# Patient Record
Sex: Male | Born: 1955 | Race: White | Hispanic: No | Marital: Married
Health system: Midwestern US, Community
[De-identification: ages and names within clinical notes are randomized; demographics above are authoritative.]

## PROBLEM LIST (undated history)

## (undated) DIAGNOSIS — G629 Polyneuropathy, unspecified: Secondary | ICD-10-CM

## (undated) DIAGNOSIS — M51369 Other intervertebral disc degeneration, lumbar region without mention of lumbar back pain or lower extremity pain: Secondary | ICD-10-CM

## (undated) DIAGNOSIS — E278 Other specified disorders of adrenal gland: Secondary | ICD-10-CM

## (undated) DIAGNOSIS — H4052X4 Glaucoma secondary to other eye disorders, left eye, indeterminate stage: Secondary | ICD-10-CM

## (undated) DIAGNOSIS — K219 Gastro-esophageal reflux disease without esophagitis: Secondary | ICD-10-CM

## (undated) DIAGNOSIS — R162 Hepatomegaly with splenomegaly, not elsewhere classified: Secondary | ICD-10-CM

## (undated) DIAGNOSIS — E119 Type 2 diabetes mellitus without complications: Secondary | ICD-10-CM

## (undated) DIAGNOSIS — M75121 Complete rotator cuff tear or rupture of right shoulder, not specified as traumatic: Secondary | ICD-10-CM

## (undated) DIAGNOSIS — I2510 Atherosclerotic heart disease of native coronary artery without angina pectoris: Secondary | ICD-10-CM

## (undated) DIAGNOSIS — M879 Osteonecrosis, unspecified: Secondary | ICD-10-CM

## (undated) DIAGNOSIS — I700 Atherosclerosis of aorta: Secondary | ICD-10-CM

## (undated) DIAGNOSIS — D329 Benign neoplasm of meninges, unspecified: Secondary | ICD-10-CM

## (undated) DIAGNOSIS — K7460 Unspecified cirrhosis of liver: Secondary | ICD-10-CM

## (undated) DIAGNOSIS — M7501 Adhesive capsulitis of right shoulder: Secondary | ICD-10-CM

## (undated) DIAGNOSIS — M1990 Unspecified osteoarthritis, unspecified site: Secondary | ICD-10-CM

## (undated) DIAGNOSIS — K449 Diaphragmatic hernia without obstruction or gangrene: Secondary | ICD-10-CM

## (undated) DIAGNOSIS — G43909 Migraine, unspecified, not intractable, without status migrainosus: Secondary | ICD-10-CM

## (undated) DIAGNOSIS — G4733 Obstructive sleep apnea (adult) (pediatric): Secondary | ICD-10-CM

## (undated) DIAGNOSIS — R112 Nausea with vomiting, unspecified: Secondary | ICD-10-CM

## (undated) DIAGNOSIS — E785 Hyperlipidemia, unspecified: Secondary | ICD-10-CM

## (undated) DIAGNOSIS — M8580 Other specified disorders of bone density and structure, unspecified site: Secondary | ICD-10-CM

## (undated) DIAGNOSIS — I10 Essential (primary) hypertension: Secondary | ICD-10-CM

## (undated) DIAGNOSIS — M4722 Other spondylosis with radiculopathy, cervical region: Secondary | ICD-10-CM

## (undated) DIAGNOSIS — Z7982 Long term (current) use of aspirin: Secondary | ICD-10-CM

## (undated) DIAGNOSIS — J45909 Unspecified asthma, uncomplicated: Secondary | ICD-10-CM

## (undated) DIAGNOSIS — Z9889 Other specified postprocedural states: Secondary | ICD-10-CM

## (undated) DIAGNOSIS — Z789 Other specified health status: Secondary | ICD-10-CM

## (undated) DIAGNOSIS — I5189 Other ill-defined heart diseases: Secondary | ICD-10-CM

## (undated) DIAGNOSIS — E11319 Type 2 diabetes mellitus with unspecified diabetic retinopathy without macular edema: Secondary | ICD-10-CM

## (undated) DIAGNOSIS — M76829 Posterior tibial tendinitis, unspecified leg: Secondary | ICD-10-CM

## (undated) DIAGNOSIS — Z86718 Personal history of other venous thrombosis and embolism: Secondary | ICD-10-CM

## (undated) DIAGNOSIS — S82875D Nondisplaced pilon fracture of left tibia, subsequent encounter for closed fracture with routine healing: Secondary | ICD-10-CM

## (undated) DIAGNOSIS — M5104 Intervertebral disc disorders with myelopathy, thoracic region: Secondary | ICD-10-CM

## (undated) DIAGNOSIS — M810 Age-related osteoporosis without current pathological fracture: Secondary | ICD-10-CM

## (undated) DIAGNOSIS — M7671 Peroneal tendinitis, right leg: Secondary | ICD-10-CM

## (undated) DIAGNOSIS — M76822 Posterior tibial tendinitis, left leg: Secondary | ICD-10-CM

## (undated) HISTORY — DX: Hepatomegaly with splenomegaly, not elsewhere classified: R16.2

## (undated) HISTORY — PX: CORONARY ANGIOPLASTY: SHX604

## (undated) HISTORY — PX: FINGER AMPUTATION: SHX636

## (undated) HISTORY — DX: Benign neoplasm of meninges, unspecified: D32.9

## (undated) HISTORY — DX: Polyneuropathy, unspecified: G62.9

## (undated) HISTORY — DX: Personal history of other venous thrombosis and embolism: Z86.718

## (undated) HISTORY — DX: Other specified disorders of bone density and structure, unspecified site: M85.80

## (undated) HISTORY — DX: Unspecified osteoarthritis, unspecified site: M19.90

## (undated) HISTORY — DX: Type 2 diabetes mellitus with unspecified diabetic retinopathy without macular edema: E11.319

## (undated) HISTORY — DX: Other specified disorders of adrenal gland: E27.8

## (undated) HISTORY — PX: OTHER SURGICAL HISTORY: SHX169

## (undated) HISTORY — PX: LUMBAR DISC SURGERY: SHX700

## (undated) HISTORY — PX: KNEE ARTHROSCOPY: SUR90

## (undated) HISTORY — PX: ACHILLES TENDON REPAIR: SUR1153

## (undated) HISTORY — DX: Atherosclerotic heart disease of native coronary artery without angina pectoris: I25.10

## (undated) HISTORY — DX: Hyperlipidemia, unspecified: E78.5

## (undated) HISTORY — DX: Unspecified asthma, uncomplicated: J45.909

## (undated) HISTORY — PX: LAPAROSCOPIC CHOLECYSTECTOMY: SUR755

## (undated) HISTORY — DX: Gastro-esophageal reflux disease without esophagitis: K21.9

## (undated) HISTORY — PX: TIBIA FRACTURE SURGERY: SHX806

## (undated) HISTORY — DX: Essential (primary) hypertension: I10

## (undated) HISTORY — DX: Migraine, unspecified, not intractable, without status migrainosus: G43.909

## (undated) HISTORY — PX: CARDIAC CATHETERIZATION: SHX172

## (undated) HISTORY — PX: CORONARY ARTERY BYPASS GRAFT: SHX141

## (undated) HISTORY — DX: Glaucoma secondary to other eye disorders, left eye, indeterminate stage: H40.52X4

## (undated) HISTORY — DX: Obstructive sleep apnea (adult) (pediatric): G47.33

## (undated) HISTORY — DX: Unspecified cirrhosis of liver: K74.60

## (undated) HISTORY — PX: ROTATOR CUFF REPAIR: SHX139

---

## 2009-08-22 LAB — CBC W/O DIFF
HCT: 46 % (ref 37.0–49.0)
HGB: 15.8 g/dL (ref 13.0–16.0)
MCH: 33.2 PG (ref 25.0–35.0)
MCHC: 34.3 g/dL (ref 31.0–37.0)
MCV: 96.9 FL (ref 78.0–98.0)
MPV: 8.6 FL (ref 7.4–10.4)
PLATELET: 246 10*3/uL (ref 130–400)
RBC: 4.75 M/uL (ref 4.50–5.30)
RDW: 12.8 % (ref 11.5–14.5)
WBC: 9.4 10*3/uL (ref 4.5–13.0)

## 2009-08-22 LAB — METABOLIC PANEL, BASIC
Anion gap: 7 mmol/L (ref 5–15)
BUN/Creatinine ratio: 24 — ABNORMAL HIGH (ref 12–20)
BUN: 22 MG/DL — ABNORMAL HIGH (ref 7–18)
CO2: 29 MMOL/L (ref 21–32)
Calcium: 9.8 MG/DL (ref 8.4–10.4)
Chloride: 97 MMOL/L — ABNORMAL LOW (ref 100–108)
Creatinine: 0.9 MG/DL (ref 0.6–1.3)
GFR est AA: >60 mL/min/{1.73_m2} (ref >60)
GFR est non-AA: >60 mL/min/{1.73_m2} (ref >60)
Glucose: 272 MG/DL — ABNORMAL HIGH (ref 74–99)
Potassium: 4.6 MMOL/L (ref 3.5–5.5)
Sodium: 133 MMOL/L — ABNORMAL LOW (ref 136–145)

## 2009-08-22 LAB — ALT: ALT (SGPT): 76 U/L — ABNORMAL HIGH (ref 30–65)

## 2009-08-22 LAB — AST: AST (SGOT): 70 U/L — ABNORMAL HIGH (ref 15–37)

## 2009-08-22 LAB — TSH 3RD GENERATION: TSH: 1.63 u[IU]/mL (ref 0.51–6.27)

## 2009-08-23 LAB — HEMOGLOBIN A1C WITH EAG: Hemoglobin A1c: 9.3 % — ABNORMAL HIGH (ref 4.8–6.0)

## 2013-06-22 LAB — AMB POC URINALYSIS DIP STICK AUTO W/O MICRO
Bilirubin (UA POC): NEGATIVE
Blood (UA POC): NEGATIVE
Ketones (UA POC): NEGATIVE
Leukocyte esterase (UA POC): NEGATIVE
Nitrites (UA POC): NEGATIVE
Protein (UA POC): NEGATIVE mg/dL
Specific gravity (UA POC): 1.015 (ref 1.001–1.035)
Urobilinogen (UA POC): 0.2 (ref 0.2–1)
pH (UA POC): 6.5 (ref ?–8.0)

## 2013-06-22 NOTE — Progress Notes (Signed)
Assessment      ICD-9-CM   1. ED (erectile dysfunction) 607.84   2. Diabetes 250.00     Erectile Dysfunction Counseling    The patient was referred from management of his erectile dysfunction by self, after watching Dr. Neil Crouch TV show.  The patient has had prior attempts at management of his erectile dysfunction.  The patient in the past has tried Viagra.    The pathophysiology and physiology of erections were discussed using the model that illustrates the difference between the flaccid and erect penis, and then using a smooth-muscle cell model I explained how an erection occurs.  The cyclic GMP and the cyclic AMP mechanisms were discussed as well as the place for alpha-sympathetic tone.      I then discussed with the patient from the standpoint of ???potential targets??? how pharmacologic therapy works and how combination therapy can be more efficacious than monotherapy.  Additionally, the place for oral therapy as well as urethral suppositories was also discussed with the patient.      The patient was told that the approach here is truly to begin with the less invasive therapy which would be oral therapy, moving on to intracorporal therapy if we could not get a successful result with oral therapy and if we could not get a success result with intracavernosal therapy then it might be appropriate to discuss prosthetic placement.  They understand that prostheses, while very good, are at this center considered to be the course of last resort.  They are such in that they have a finite life span, generally in the time of about 10 years, and with revision, the complication rate is higher.  The patient was also counseled with the option of continued watchful waiting.      Total time in the office face to face was 45 minutes and well over half of that was devoted to counseling, education, and coordination of care.                  Plan:  1. Daily Cialis 5 mg and PRN Viagra 100 mg as booster  2. VED  3. Consider PEP.     Follow-up Disposition: Not on File             06/22/2013        Subjective  Chief Complaint   Patient presents with   ??? Erectile Dysfunction     SELF REFERRED     HPI: Kyle Mueller is a 57 y.o. Caucasian male with a history of difficulty attaining and maintaining erections. States that symptoms progressed since onset, about 10 years ago, and now achieves 0% rigidity spontaneously, and he uses a VED with 60 % rigidity and uses the ring to maintain. With current rigidity he cannot have intercourse. Has tried Viagra with go response in that improved rigidity but not maintenance of rigidity. Had side effects with Viagra, headaches.    Since 2 years ago has lost 100 pounds over a period of 5 months. Before that he had hypercholesterolemia, HTN, type 2 DM, and was receiving medications for this conditions, but not now.  Former smoker, quit 16 years ago.  Works at [employer].   Family Hx Prostate cancer, father; died of lung cancer from asbestosis, mesotelioma (worked at shipyard); so his stepmother has mesothelioma.   No difficulty voiding, has noticed decreased FOS  Flank pain: YES  Decreased output: NO  Difficulty voiding: NO  Burning with urination: NO  Notable blood in urine:  NO  Incontinence:  NO  Nocturia:   YES  How many times?   1    ROS:    Review of Systems  Constitutional: Fever: No  Skin: Rash: No  HEENT: Hearing difficulty: No  Eyes: Blurred vision: No  Cardiovascular: Chest pain: No  Respiratory: Shortness of breath: No  Gastrointestinal: Nausea/vomiting: No  Musculoskeletal: Back pain: Yes  Neurological: Weakness: No  Psychological: Memory loss: No  Comments/additional findings:       Past Medical History   Diagnosis Date   ??? Erectile dysfunction    ??? Asthma    ??? Diabetes      CONTROLLED W/FOOD AND DIET     Past Surgical History   Procedure Laterality Date   ??? Hx rotator cuff repair  2009     OBICI   ??? Hx orthopaedic       BILAT ACHILLES TENDON REPAIR   ??? Hx back surgery  1991     L-4 L-5 DISCECTOMY      History     Social History   ??? Marital Status: MARRIED     Spouse Name: N/A     Number of Children: N/A   ??? Years of Education: N/A     Occupational History   ??? Not on file.     Social History Main Topics   ??? Smoking status: Former Smoker -- 1.00 packs/day for 25 years     Quit date: 12/24/1996   ??? Smokeless tobacco: Never Used   ??? Alcohol Use: No   ??? Drug Use: No   ??? Sexually Active: Yes -- Male partner(s)      Comment: E.D.     Other Topics Concern   ??? Not on file     Social History Narrative   ??? No narrative on file     Family History   Problem Relation Age of Onset   ??? Inflammatory Bowel Dz Mother    ??? Cancer Father      LUNG CA   ??? Kidney Disease Sister      RENAL FAILURE   ??? Diabetes Sister      No current outpatient prescriptions on file prior to visit.     No current facility-administered medications on file prior to visit.     Allergies   Allergen Reactions   ??? Augmentin (Amoxicillin-Pot Clavulanate) Anaphylaxis, Shortness of Breath and Palpitations     BP DROPS   ??? Codeine Other (comments)     COMBATIVE   ??? Dilaudid (Hydromorphone (Bulk)) Other (comments)     HALLUCINATIONS       Objective  BP 142/84   Ht 6' (1.829 m)   Wt 200 lb (90.719 kg)   BMI 27.12 kg/m2  Constitutional: WDWN, Pleasant and appropriate affect, No acute distress.    CV:  No peripheral swelling noted  Respiratory: No respiratory distress or difficulties  Abdomen:  No abdominal masses or tenderness.  No CVA tenderness. No hernias noted.   GU Male: DRE and PSA by PCP    SCROTUM:  No scrotal rash or lesions noticed.  Normal bilateral testes and epididymis.   PENIS: Urethral meatus normal in location and size. No urethral discharge.  Skin: No jaundice.    Neuro/Psych:  Alert and oriented x 3. Affect appropriate.   Lymphatic:   No enlarged inguinal lymph nodes.          No results found for this or any previous visit.    This note has been  sent to the referring physician with findings and recommendations. I would like to thank Dr.  Cordelia Poche, NP for allowing me the opportunity to assist with the care of this nice patient.       Scot Dock, MD  [facility]  A Division of [facility] [place]

## 2013-06-24 NOTE — Telephone Encounter (Signed)
Patient called to say he has tried the Cialis and Levitra given to him earlier this week and he can't tolerate them because they too cause a terrible headache.  He asked about injection therapy, which is something Dr Charyl Bigger had mentioned to him.  He is scheduled to come in the lst week of August.

## 2013-07-27 NOTE — Progress Notes (Signed)
The patient came in for what was to be his 1st ici dosing appointment, but after viewing the video and going thru the medications and their cost, he stated he would rather get a penile prosthesis.  I gave him the information for prosthesis and told him to check with his insurance company to make sure it was a covered benefit.  He also has a f/u appointment in September with Dr Charyl Bigger, but asked to be seen sometime this month if possible.  He was given an appointment for later this week.  I asked Vee if his payment today could be applied to the next office visit and she made a note in the system.

## 2015-07-14 ENCOUNTER — Encounter: Attending: Specialist | Primary: Registered Nurse

## 2015-07-15 DIAGNOSIS — M1990 Unspecified osteoarthritis, unspecified site: Secondary | ICD-10-CM | POA: Insufficient documentation

## 2016-05-24 DIAGNOSIS — A498 Other bacterial infections of unspecified site: Secondary | ICD-10-CM

## 2016-05-24 DIAGNOSIS — B9561 Methicillin susceptible Staphylococcus aureus infection as the cause of diseases classified elsewhere: Secondary | ICD-10-CM

## 2016-05-24 HISTORY — PX: LEFT HEART CATH AND CORONARY ANGIOGRAPHY: CATH118249

## 2016-05-24 HISTORY — DX: Methicillin susceptible Staphylococcus aureus infection as the cause of diseases classified elsewhere: B95.61

## 2016-05-24 HISTORY — DX: Other bacterial infections of unspecified site: A49.8

## 2016-05-25 DIAGNOSIS — Z951 Presence of aortocoronary bypass graft: Secondary | ICD-10-CM

## 2016-05-25 HISTORY — PX: CORONARY ARTERY BYPASS GRAFT: SHX141

## 2016-05-25 HISTORY — DX: Presence of aortocoronary bypass graft: Z95.1

## 2018-05-21 ENCOUNTER — Ambulatory Visit: Attending: Foot and Ankle Surgery | Primary: Registered Nurse

## 2018-05-21 ENCOUNTER — Ambulatory Visit
Admit: 2018-05-21 | Discharge: 2018-05-21 | Payer: PRIVATE HEALTH INSURANCE | Attending: Foot and Ankle Surgery | Primary: Registered Nurse

## 2018-05-21 DIAGNOSIS — M76822 Posterior tibial tendinitis, left leg: Secondary | ICD-10-CM

## 2018-05-21 MED ORDER — DICLOFENAC 75 MG TAB, DELAYED RELEASE
75 mg | ORAL_TABLET | Freq: Two times a day (BID) | ORAL | 0 refills | Status: DC
Start: 2018-05-21 — End: 2018-07-04

## 2018-05-21 NOTE — Progress Notes (Signed)
1. Have you been to the ER, urgent care clinic since your last visit?  Hospitalized since your last visit?Yes      2. Have you seen or consulted any other health care providers outside of the [facility] since your last visit?  Include any pap smears or colon screening.           Patient was treated this pass Saturday at [place]

## 2018-05-21 NOTE — Progress Notes (Signed)
Verbal order given by Dr. Caines to sign order for MRI and DME entered by Jennifer Hesse.

## 2018-05-21 NOTE — Progress Notes (Signed)
AMBULATORY PROGRESS NOTE      Patient: Kyle Mueller             MRN: [id number]     SSN: [id number] Body mass index is 29.13 kg/m??.  Date of Birth: 21-Nov-1956     AGE: 62 y.o.       EX: male    PCP: Cordelia Poche, NP     IMPRESSION/DIAGNOSIS AND TREATMENT PLAN     DIAGNOSES  1. 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 Post-traumatic osteoarthritis of left ankle        Orders Placed This Encounter   ??? AMB SUPPLY ORDER   ??? MRI ANKLE LT WO CONT   ??? diclofenac EC (VOLTAREN) 75 mg EC tablet      Kyle Mueller understands his diagnoses and the proposed plan.   He has posterior tibial tendinitis, moderate.  There is some swelling and some tenderness to the posterior tibial tendon and the anterior joint line.  Clinically, he has poor motion of the hindfoot.  He has OA of the ankle, subtalar, and transverse tarsal joint regions, and to the talonavicular region, as there is evidence of bone spurs at the talonavicular region and poor motion of the subtalar region and decreased and/or restricted range of motion of the ankle.    Because of his continued pain and discomfort along the posterior tibial tendon, recommendation is for an MRI of the hindfoot to assess the posterior tendon to rule out a concomitant interstitial tear to this region, as he has distinct swelling and tenderness to this region.  Indeed, an MRI does have prognostic value in this individual.        Plan:    1) MRI without IV Contrast of the left ankle; please assess the posterior tibial tendon.   2) DME Order: Short left CAM walker boot.   3) Voltaren 75 mg: 1 PO BID; 60 tablets, 0 refills.   4) Pepcid 40 mg: 1 PO every day; 30 tablets, 0 refills. The patient already has this medication.   5) Continue activity modification as directed.      RTO - after MRI   HPI AND EXAMINATION     Kyle Mueller IS A 62 y.o. male who presents to my outpatient office complaining of left foot pain. Mr. Bonczek states that he has been  experiencing pain in his left medial ankle for the past 2-3 weeks. He believes that this pain is due to bone spurs in this area. He recalls that his left medial ankle became swollen a few days ago after wearing shoes for some time. He has been taking Ibuprofen and has tried an ankle brace and muscle relaxers for his pain, none of which have helped with his pain. XR imaging has been reviewed with the patient.     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 He is not currently taking blood thinners, other than ASA.     Visit Vitals  BP 129/72 (BP 1 Location: Left arm, BP Patient Position: Sitting)   Pulse 91   Temp 96.6 ??F (35.9 ??C) (Oral)   Resp 18   Ht 6' (1.829 m)   Wt 214 lb 12.8 oz (97.4 kg)   SpO2 93%   BMI 29.13 kg/m??     Appearance: Alert, well appearing and pleasant patient who is in no distress, oriented to person, place/time, and who follows commands.This patient is accompanied in the examination room by his  wife. Dementia: no dementia  Psychiatric: Affect and mood are appropriate. Patient arrives to office via: without assistive device  HEENT: Head normocephalic & atraumatic. Both pupils are round, non icteric sclera   Eye: EOM are intact and sclera are clear    Neck: ROM WNL and JVD neck is not present     Hearings Intact, does not require hearing aid device  Respiratory: Breathing is unlabored without accessory chest muscle use  Cardiovascular/Peripheral Vascular: Normal Pulses to each foot    ANKLE/FOOT left    Gait: Normal  Tenderness: Mild tenderness to the posterior tibial tendon behind the medial malleolus.   Cutaneous: WNL.  Joint Motion: Can DF to neutral with knee flexed and extended    30 degrees of PF    Limited ST motion    Limited inversion.   Joint / Tendon Stability: No Ankle or Subtalar instability or joint laxity.                       No peroneal sublux ability or dislocation  Alignment: Forefoot, Midfoot, Hindfoot WNL.   Neuro Motor/Sensory: NL/NL.   Vascular: NL foot/ankle pulses.  Lymphatics: No extremity lymphedema, No calf swelling, no tenderness to calf muscles.    CHART REVIEW     Past Medical History:   Diagnosis Date   ??? Asthma    ??? Diabetes (HCC)     CONTROLLED W/FOOD AND DIET   ??? 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 Erectile dysfunction    ??? Hypertension      Current Outpatient Medications   Medication Sig   ??? aspirin (ASPIRIN) 325 mg tablet Take 325 mg by mouth.   ??? benzonatate (TESSALON) 200 mg capsule Take 200 mg by mouth.   ??? glucose blood VI test strips (ONETOUCH ULTRA BLUE TEST STRIP) strip TEST BLOOD GLUCOSE UP TO  QID   ??? cyclobenzaprine (FLEXERIL) 10 mg tablet Take 5-10 mg by mouth.   ??? insulin glargine-lixisenatide (SOLIQUA 100/33) 100 unit-33 mcg/mL inpn 22 Units by SubCUTAneous route.   ??? metoprolol succinate (TOPROL-XL) 25 mg XL tablet Take 25 mg by mouth.   ??? omeprazole (PRILOSEC) 40 mg capsule Take 40 mg by mouth.   ??? rosuvastatin (CRESTOR) 20 mg tablet Take 20 mg by mouth.   ??? diclofenac EC (VOLTAREN) 75 mg EC tablet Take 1 Tab by mouth two (2) times daily (with meals).   ??? tadalafil (CIALIS) 5 mg tablet Take 5 mg by mouth daily as needed.   ??? vardenafil (LEVITRA) 20 mg tablet Take 20 mg by mouth daily as needed for 10 doses.     No current facility-administered medications for this visit.      Allergies   Allergen Reactions   ??? Augmentin [Amoxicillin-Pot Clavulanate] Anaphylaxis, Shortness of Breath and Palpitations     BP DROPS   ??? Codeine Other (comments)     COMBATIVE   ??? Dilaudid [Hydromorphone (Bulk)] Other (comments)     HALLUCINATIONS   ??? Percocet [Oxycodone-Acetaminophen] Nausea and Vomiting     Past Surgical History:   Procedure Laterality Date   ??? CARDIAC SURG PROCEDURE UNLIST  05/25/2016   ??? HX BACK SURGERY  1991    L-4 L-5 DISCECTOMY   ??? HX MOHS PROCEDURES  2009    OBICI   ??? HX ORTHOPAEDIC      BILAT ACHILLES TENDON REPAIR     Social History     Occupational History   ???  Not on file   Tobacco Use    ??? Smoking status: Former Smoker     Packs/day: 1.00     Years: 25.00     Pack years: 25.00     Last attempt to quit: 12/24/1996     Years since quitting: 21.4   ??? Smokeless tobacco: Never Used   Substance and Sexual Activity   ??? Alcohol use: No     Frequency: Never     Comment: ocassional ( did not answer)    ??? Drug use: No   ??? Sexual activity: Yes     Partners: Female     Comment: E.D.     Family History   Problem Relation Age of Onset   ??? Inflammatory Bowel Dz Mother    ??? Cancer Father         LUNG CA   ??? Kidney Disease Sister         RENAL FAILURE   ??? Diabetes Sister         REVIEW OF SYSTEMS : 05/21/2018  ALL BELOW ARE Negative except : SEE HPI     CONSTITUTIONAL: denies chills, fatigue, fever, weight change   PSYCH: denies anxiety, depression, irritability or mood swings   ENT: denies - headaches, hearing change, nasal congestion, oral lesions, or sore throat   HEM/ONC denies - bleeding problems, bruising, pallor or swollen lymph nodes   ENDO: denies hot flashes, polydipsia/polyuria or temperature intolerance   RESP: denies - cough, shortness of breath or wheezing   CV: denies - chest pain, edema or palpitations, DOE  GI: denies - abdominal pain, change in bowel habits, constipation, diarrhea or nausea/vomiting   GU: denies - dysuria, hematuria, incontinence, pelvic pain   MSK: denies  - See HPI.  NEURO: denies - confusion, headaches, seizures or weakness   DERM: denies - dry skin, hair changes, rash or skin lesion changes  VASCULAR: Peripheral Vascular: No calf pain, vascular vein tenderness to calf pain              No calf throbbing, posterior knee throbbing pain      DIAGNOSTIC IMAGING      [facility] : INTERPRETATION BY RADIOLOGIST       2019  May  ANKLE COMPLETE LT5/25/2019  [facility]  Result Impression    Soft tissue swelling laterally.????No acute fractures or subluxation of left ankle. Degenerative changes as described.     Signed By: Gretchen Portela, MD on 05/17/2018 8:45 PM    Result Narrative   EXAM: Left Ankle x-rays 3 views. AP, lateral and oblique    INDICATION: Left ankle swelling    COMPARISON: None    ___________    FINDINGS: There are no acute fractures or subluxation. Ankle mortis is intact. Soft tissues demonstrate soft tissue swelling laterally . There is normal bone mineralization. There are suture anchors in the calcaneus. There is a 6.5 mm sharp plantar fascia calcaneal spur. There is spurring along the dorsal aspect of the talonavicular joint. Is also spurring along the anterior aspect of the tibia which may predispose to anterior impingement.    ___________   Other Result Information   Interface, [facility] - 05/17/2018  8:48 PM EDT  EXAM: Left Ankle x-rays 3 views. AP, lateral and oblique    INDICATION: Left ankle swelling    COMPARISON: None    ___________    FINDINGS: There are no acute fractures or subluxation. Ankle mortis is intact. Soft tissues demonstrate  soft tissue swelling laterally . There is normal bone mineralization. There are suture anchors in the calcaneus. There is a 6.5 mm sharp plantar fascia calcaneal spur. There is spurring along the dorsal aspect of the talonavicular joint. Is also spurring along the anterior aspect of the tibia which may predispose to anterior impingement.    ___________    IMPRESSION Soft tissue swelling laterally.  No acute fractures or subluxation of left ankle. Degenerative changes as described.     Signed By: Gretchen Portela, MD on 05/17/2018 8:45 PM   Status Results Details   ?? Encounter Summary       I have personally reviewed these images of the above study. I agree with interpretation from radiologist.        Netta Corrigan, MD  05/21/2018  10:33 AM      Written by Hoyt Koch, [employer], as dictated by Dr. Silvano Bilis. I, Dr. Silvano Bilis, confirm that all documentation is accurate.

## 2018-05-21 NOTE — Patient Instructions (Signed)
An MRI or CT has been ordered for you. A [place] scheduler will be contacting you to schedule the appointment as soon as it has been approved with your insurance company. Please schedule an appointment to follow up with the doctor or the physicians assistant after the MRI or CT has been conducted.       Tendon Injury (Tendinopathy): Care Instructions  Your Care Instructions    Tendons are tough, flexible tissues that connect muscle to bone. A tendon can hurt or get torn from overuse or aging, especially tendons in the shoulder, elbow, wrist, hip, knee, or ankle. Tendon injuries may be called tendinopathy or tendinitis. Tendon injuries can occur from any motion you have to repeat in a job, sports, or daily activities. Tennis elbow is one common tendon injury.  You can treat most tendon problems with over-the-counter pain medicine, rest, changes in your activities, and physical therapy.  Follow-up care is a key part of your treatment and safety. Be sure to make and go to all appointments, and call your doctor if you are having problems. It's also a good idea to know your test results and keep a list of the medicines you take.  How can you care for yourself at home?  ?? Rest the sore area. You may have to stop doing the activity that caused the tendon pain for a while.  ?? Take an over-the-counter pain medicine, such as acetaminophen (Tylenol), ibuprofen (Advil, Motrin), or naproxen (Aleve). Read and follow all instructions on the label.  ?? Do not take two or more pain medicines at the same time unless the doctor told you to. Many pain medicines have acetaminophen, which is Tylenol. Too much acetaminophen (Tylenol) can be harmful.  ?? Put ice or a cold pack on the sore area for 10 to 20 minutes at a time. Try to do this every 1 to 2 hours for the next 3 days (when you are awake) or until any swelling goes down. Put a thin cloth between the ice and your skin.   ?? Prop up the sore area on a pillow when you ice it or anytime you sit or lie down during the next 3 days. Try to keep it above the level of your heart. This will help reduce swelling.  ?? Follow your doctor's advice for wearing and caring for a sling, splint, or cast. In some cases, you may wear one of these for a while to help your tendon heal.  ?? Follow your doctor's advice for stretching and physical therapy. Gently move your joint through its full range of motion. This will prevent stiffness in your joint.  ?? Go back to your activity slowly. Warm up before and stretch after the activity. You also can try making some changes. For example, if a sport caused your tendon pain, alternate the sport with another activity. If using a tool causes pain, switch hands or change your grip. Stop the activity if it hurts. After the activity, apply ice to prevent pain and swelling.  ?? Do not smoke. Smoking can slow healing. If you need help quitting, talk to your doctor about stop-smoking programs and medicines. These can increase your chances of quitting for good.  When should you call for help?  Watch closely for changes in your health, and be sure to contact your doctor if:  ?? ?? Your pain gets worse.   ?? ?? You do not get better as expected.   Where can you learn more?    Go to http://www.healthwise.net/[employer].  Enter A157 in the search box to learn more about "Tendon Injury (Tendinopathy): Care Instructions."  Current as of: September 12, 2017  Content Version: 11.9  ?? 2006-2018 [employer]. Care instructions adapted under license by [employer] (which disclaims liability or warranty for this information). If you have questions about a medical condition or this instruction, always ask your healthcare professional. [employer]wise, Inc[employer] disclaims any warranty or liability for your use of this information.

## 2018-05-21 NOTE — Progress Notes (Addendum)
Progress Notes by Netta Corrigan, MD at 05/21/18 0830                Author: Netta Corrigan, MD  Service: --  Author Type: Physician       Filed: 05/22/18 1901  Encounter Date: 05/21/2018  Status: Signed          Editor: Netta Corrigan, MD (Physician)          Related Notes: Original Note by Netta Corrigan, MD (Physician) filed at 05/21/18 1039               AMBULATORY PROGRESS NOTE         Patient: Kyle Mueller              MRN: [id number]     SSN:  [id number] Body mass index is 29.13 kg/m??.   Date of Birth: 04-Mar-1956     AGE:  62 y.o.       EX: male      PCP: Cordelia Poche, NP         IMPRESSION/DIAGNOSIS AND TREATMENT PLAN        DIAGNOSES      1.  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  Post-traumatic osteoarthritis of left ankle              Orders Placed This Encounter        ?  AMB SUPPLY ORDER     ?  MRI ANKLE LT WO CONT        ?  diclofenac EC (VOLTAREN) 75 mg EC tablet         Kyle Mueller understands his  diagnoses and the proposed plan.    He has posterior tibial tendinitis, moderate.  There is some swelling and some tenderness to the posterior tibial tendon and the anterior joint line.  Clinically, he has poor motion of the hindfoot.  He has OA of the ankle, subtalar, and transverse tarsal  joint regions, and to the talonavicular region, as there is evidence of bone spurs at the talonavicular region and poor motion of the subtalar region and decreased and/or restricted range of motion of the ankle.      Because of his continued pain and discomfort along the posterior tibial tendon, recommendation is for an MRI of the hindfoot to assess the posterior tendon to rule out a concomitant interstitial tear to this region, as he has distinct swelling and tenderness  to this region.  Indeed, an MRI does have prognostic value in this individual.           Plan:      1) MRI without IV Contrast of the left ankle; please assess the posterior tibial tendon.    2)  DME Order: Short left CAM walker boot.    3) Voltaren 75 mg: 1 PO BID; 60 tablets, 0 refills.    4) Pepcid 40 mg: 1 PO every day; 30 tablets, 0 refills. The patient already has this medication.    5) Continue activity modification as directed.        RTO - after MRI         HPI AND EXAMINATION        Kyle Mueller IS A 62 y.o.  male who presents to my outpatient office complaining of left foot pain. [name] states that he has been experiencing pain in his left medial ankle for  the past 2-3 weeks. He believes that this  pain is due to bone spurs in this area. He recalls that his left medial ankle became swollen a few days ago after wearing shoes for some time. He has been taking Ibuprofen and has tried an ankle brace and muscle relaxers for his pain, none of which have  helped with his pain. XR imaging has been reviewed with the patient.       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 He is not currently taking blood thinners, other than ASA.       Visit Vitals      BP  129/72 (BP 1 Location: Left arm, BP Patient Position: Sitting)     Pulse  91     Temp  96.6 ??F (35.9 ??C) (Oral)     Resp  18     Ht  6' (1.829 m)     Wt  214 lb 12.8 oz (97.4 kg)     SpO2  93%        BMI  29.13 kg/m??        Appearance: Alert, well appearing and pleasant patient who is in no distress, oriented to person, place/time, and who follows commands.This  patient is accompanied in the examination room by his wife . Dementia: no dementia   Psychiatric: Affect and mood are appropriate. Patient arrives to office via: without assistive device   HEENT: Head normocephalic & atraumatic. Both pupils are round, non icteric sclera    Eye: EOM are intact and sclera are clear     Neck: ROM WNL and JVD neck is not present      Hearings Intact, does not require hearing aid device   Respiratory: Breathing is unlabored without accessory chest muscle use   Cardiovascular/Peripheral Vascular: Normal Pulses to each foot      ANKLE/FOOT left       Gait: Normal   Tenderness: Mild tenderness to the posterior tibial tendon behind the medial malleolus.    Cutaneous: WNL.   Joint Motion: Can DF to neutral with knee flexed and extended     30 degrees of PF     Limited ST motion     Limited inversion.    Joint / Tendon Stability: No Ankle or Subtalar instability or joint laxity.                        No peroneal sublux ability or dislocation   Alignment: Forefoot, Midfoot, Hindfoot WNL.    Neuro Motor/Sensory: NL/NL.   Vascular: NL foot/ankle pulses.   Lymphatics: No extremity lymphedema, No calf swelling, no tenderness to calf muscles.        CHART REVIEW          Past Medical History:        Diagnosis  Date         ?  Asthma       ?  Diabetes (HCC)            CONTROLLED W/FOOD AND DIET         ?  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  Erectile dysfunction           ?  Hypertension            Current Outpatient Medications        Medication  Sig         ?  aspirin (ASPIRIN) 325 mg tablet  Take 325 mg by mouth.     ?  benzonatate (TESSALON) 200 mg capsule  Take 200 mg by mouth.     ?  glucose blood VI test strips (ONETOUCH ULTRA BLUE TEST STRIP) strip  TEST BLOOD GLUCOSE UP TO  QID     ?  cyclobenzaprine (FLEXERIL) 10 mg tablet  Take 5-10 mg by mouth.     ?  insulin glargine-lixisenatide (SOLIQUA 100/33) 100 unit-33 mcg/mL inpn  22 Units by SubCUTAneous route.     ?  metoprolol succinate (TOPROL-XL) 25 mg XL tablet  Take 25 mg by mouth.     ?  omeprazole (PRILOSEC) 40 mg capsule  Take 40 mg by mouth.     ?  rosuvastatin (CRESTOR) 20 mg tablet  Take 20 mg by mouth.     ?  diclofenac EC (VOLTAREN) 75 mg EC tablet  Take 1 Tab by mouth two (2) times daily (with meals).     ?  tadalafil (CIALIS) 5 mg tablet  Take 5 mg by mouth daily as needed.         ?  vardenafil (LEVITRA) 20 mg tablet  Take 20 mg by mouth daily as needed for 10 doses.          No current facility-administered medications for this visit.           Allergies        Allergen   Reactions         ?  Augmentin [Amoxicillin-Pot Clavulanate]  Anaphylaxis, Shortness of Breath and Palpitations             BP DROPS         ?  Codeine  Other (comments)             COMBATIVE         ?  Dilaudid [Hydromorphone (Bulk)]  Other (comments)             HALLUCINATIONS         ?  Percocet [Oxycodone-Acetaminophen]  Nausea and Vomiting          Past Surgical History:         Procedure  Laterality  Date          ?  CARDIAC SURG PROCEDURE UNLIST    05/25/2016     ?  HX BACK SURGERY    1991          L-4 L-5 DISCECTOMY          ?  HX MOHS PROCEDURES    2009          OBICI          ?  HX ORTHOPAEDIC              BILAT ACHILLES TENDON REPAIR          Social History          Occupational History        ?  Not on file       Tobacco Use         ?  Smoking status:  Former Smoker              Packs/day:  1.00         Years:  25.00         Pack years:  25.00         Last attempt to quit:  12/24/1996  Years since quitting:  21.4         ?  Smokeless tobacco:  Never Used       Substance and Sexual Activity         ?  Alcohol use:  No              Frequency:  Never             Comment: ocassional ( did not answer)          ?  Drug use:  No     ?  Sexual activity:  Yes              Partners:  Female             Comment: E.D.          Family History         Problem  Relation  Age of Onset          ?  Inflammatory Bowel Dz  Mother       ?  Cancer  Father                LUNG CA          ?  Kidney Disease  Sister                RENAL FAILURE          ?  Diabetes  Sister                REVIEW OF SYSTEMS : 05/21/2018   ALL BELOW ARE Negative except : SEE HPI        CONSTITUTIONAL: denies chills, fatigue, fever, weight change    PSYCH: denies anxiety, depression, irritability or mood swings    ENT: denies - headaches, hearing change, nasal congestion, oral lesions, or sore throat    HEM/ONC denies - bleeding problems, bruising, pallor or swollen lymph nodes    ENDO: denies hot flashes, polydipsia/polyuria or temperature  intolerance    RESP: denies - cough, shortness of breath or wheezing    CV: denies - chest pain, edema or palpitations, DOE   GI: denies - abdominal pain, change in bowel habits, constipation, diarrhea or nausea/vomiting    GU: denies - dysuria, hematuria, incontinence, pelvic pain    MSK: denies  - See HPI.   NEURO: denies - confusion, headaches, seizures or weakness    DERM: denies - dry skin, hair changes, rash or skin lesion changes   VASCULAR: Peripheral Vascular: No calf pain, vascular vein tenderness to calf pain               No calf throbbing, posterior knee throbbing pain          DIAGNOSTIC IMAGING         [facility] : INTERPRETATION BY RADIOLOGIST          2019   May     ANKLE COMPLETE LT5/25/2019   [facility]     Result Impression      Soft tissue swelling laterally.????No acute fractures or subluxation of left ankle. Degenerative changes as described.     Signed By: Gretchen Portela, MD on 05/17/2018 8:45 PM     Result Narrative       EXAM: Left Ankle x-rays 3 views. AP, lateral and oblique    INDICATION: Left ankle swelling    COMPARISON: None    ___________     FINDINGS: There  are no acute fractures or subluxation. Ankle mortis is intact. Soft tissues demonstrate soft tissue swelling laterally . There is normal bone mineralization. There are suture anchors in the calcaneus. There is a 6.5 mm sharp plantar fascia  calcaneal spur. There is spurring along the dorsal aspect of the talonavicular joint. Is also spurring along the anterior aspect of the tibia which may predispose to anterior impingement.    ___________       Other Result Information     Interface, [facility] Rad Res - 05/17/2018  8:48 PM EDT  EXAM: Left Ankle x-rays 3 views. AP, lateral and oblique    INDICATION: Left ankle  swelling    COMPARISON: None    ___________    FINDINGS: There are no acute fractures or subluxation. Ankle mortis is intact. Soft tissues demonstrate soft tissue swelling laterally . There is  normal bone mineralization. There are  suture anchors in the calcaneus. There is a 6.5 mm sharp plantar fascia calcaneal spur. There is spurring along the dorsal aspect of the talonavicular joint. Is also spurring along the anterior aspect of the tibia which may predispose to anterior impingement.     ___________    IMPRESSION Soft tissue swelling laterally.  No acute fractures or subluxation of left ankle. Degenerative changes as described.     Signed By: Gretchen Portela, MD on 05/17/2018 8:45 PM        Status  Results Details     ??  Encounter Summary           I have personally reviewed these images of the above study. I agree with interpretation from radiologist.            Netta Corrigan, MD   05/21/2018   10:33 AM         Written by Hoyt Koch, ScribeKick, as dictated by Dr. Silvano Bilis. I, Dr. Silvano Bilis, confirm that all documentation is accurate.

## 2018-05-21 NOTE — Progress Notes (Signed)
1. Have you been to the ER, urgent care clinic since your last visit?  Hospitalized since your last visit?Yes      2. Have you seen or consulted any other health care providers outside of the [facility] since your last visit?  Include any pap smears or colon screening.           Patient was treated this pass Saturday at [facility]

## 2018-06-05 ENCOUNTER — Inpatient Hospital Stay: Admit: 2018-06-05 | Payer: PRIVATE HEALTH INSURANCE | Attending: Foot and Ankle Surgery | Primary: Registered Nurse

## 2018-06-05 ENCOUNTER — Ambulatory Visit
Admit: 2018-06-05 | Discharge: 2018-06-05 | Payer: PRIVATE HEALTH INSURANCE | Attending: Sports Medicine | Primary: Registered Nurse

## 2018-06-05 DIAGNOSIS — S8255XA Nondisplaced fracture of medial malleolus of left tibia, initial encounter for closed fracture: Secondary | ICD-10-CM

## 2018-06-05 DIAGNOSIS — M79672 Pain in left foot: Secondary | ICD-10-CM

## 2018-06-05 MED ORDER — TRAMADOL 50 MG TAB
50 mg | ORAL_TABLET | Freq: Three times a day (TID) | ORAL | 0 refills | Status: AC | PRN
Start: 2018-06-05 — End: 2018-06-12

## 2018-06-05 NOTE — Telephone Encounter (Signed)
Patient was seen by Celene SkeenBrandee Winkler

## 2018-06-05 NOTE — Telephone Encounter (Signed)
Patient came in from having his MRI and states they told him to come back over immediately. They told him he has a broken bone in his foot.  [facility].       (Patient can be reached at [phone]

## 2018-06-05 NOTE — Progress Notes (Signed)
Kyle EricJohn H Fiorello  11/01/1956   Chief Complaint   Patient presents with   ??? Foot Pain     Left foot MRI follow up         HISTORY OF PRESENT ILLNESS  Kyle Mueller is a 62 y.o. male who presents today for reevaluation of left ankle pain. He was last seen by Dr. Burt Knackaines May/2019 who ordered an MRI of the left ankle. He got this MRI done this morning which showed an incomplete nondisplaced distal tibia fracture at the base of the medial malleolus. He was told to come to the office to discuss treatment for the fracture. He continues to have pain with ambulation in the boot. His pain is sharp and located at the base of his medial ankle.     Patient denies any fever, chills, chest pain, shortness of breath or calf pain. There are no new illness or injuries to report since last seen in the office. No changes in medications, allergies, social or family history.     PHYSICAL EXAM:   Visit Vitals  BP 116/77   Pulse 71   Temp 97.6 ??F (36.4 ??C) (Oral)   Resp 16   Ht 6' (1.829 m)   Wt 214 lb (97.1 kg)   SpO2 98%   BMI 29.02 kg/m??     The patient is a well-developed, well-nourished male   in no acute distress.  The patient is alert and oriented times three. Mood and affect are normal.  LYMPHATIC: lymph nodes are not enlarged and are within normal limits  SKIN: normal in color and non tender to palpation. There are no bruises or abrasions noted.   NEUROLOGICAL: Motor sensory exam is within normal limits. Reflexes are equal bilaterally. There is normal sensation to pinprick and light touch  MUSCULOSKELETAL:  Examination Left Ankle/Foot   Skin Intact   Swelling mild   Dorsiflexion 5   Plantarflexion 15   Deformity -   Inversion laxity Not assessed   Anterior drawer Not assessed   Medial tenderness ++   Lateral tenderness -   Heel cord Intact   Sensation Intact   Bunion -   Toe nails Normal   Capillary refill Normal        IMAGING:   MRI Left Ankle  IMPRESSION:  ??   Incomplete nondisplaced fracture of the distal tibia at the base of the medial  malleolus described above.  ??  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 Large Achilles  enthesophyte.  -Plantar calcaneal spur.  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  ??  Additional chronic bony changes as above. No other significant findings.  ??    IMPRESSION:      ICD-10-CM ICD-9-CM    1. Foot pain, left M79.672 729.5 AMB POC XRAY, FOOT; COMPLETE, 3+ VIEW   2. Closed nondisplaced fracture of medial malleolus of left tibia, initial encounter S82.55XA 824.0 AMB SUPPLY ORDER      traMADol (ULTRAM) 50 mg tablet        PLAN:   Pt with left nondisplaced distal tibia fracture shown on MRI.  He refused xrays today in the office.   He was taken out of the boot and put into a short leg cast today.  Pt given crutches and instructed to be NWB LLE. Talked about this being important to help prevent this fracture from displacing.  He was also given an order for knee scooter to help with ambulation  Pt given a short course of tramadol to help with pain. Patient given pain medication for short term acute pain relief. Goal is to treat patient according to above plan and to ultimately have patient off all pain medications once appropriate. If chronic pain management is required beyond what is expected for current orthopedic problem, will refer patient to pain management. PMP was reviewed and will be reviewed with every medication refill request.   Pt was notified Dr. Burt Knack will be out next week and pt is leaving week after for a trip. Pt will follow up in the office as soon as he gets back in town.  RTC 2 weeks for xrays and evaluation with Dr. Burt Knack.    Reviewed MRI with Dr. Andrey Campanile today who agrees with treatment plan      Daniel Nones, PA-C  [facility] Specialist

## 2018-06-05 NOTE — Progress Notes (Signed)
Progress Notes by Daniel NonesWinkler, Airam Heidecker L, PA at 06/05/18 0945                Author: Daniel NonesWinkler, Esmay Amspacher L, PA  Service: --  Author Type: Physician Assistant       Filed: 06/05/18 1118  Encounter Date: 06/05/2018  Status: Signed          Editor: Daniel NonesWinkler, Amberrose Friebel L, PA (Physician Assistant)                          Kyle Mueller   01-09-1956      Chief Complaint       Patient presents with        ?  Foot Pain             Left foot MRI follow up             HISTORY OF PRESENT ILLNESS   Kyle Mueller is a 62 y.o.  male who presents today for reevaluation of left ankle pain. He was last seen by Dr. Burt Knackaines May/2019 who ordered an MRI of the left ankle. He got this MRI done this morning which showed an incomplete  nondisplaced distal tibia fracture at the base of the medial malleolus. He was told to come to the office to discuss treatment for the fracture. He continues to have pain with ambulation in the boot. His pain is sharp and located at the base of his medial  ankle.       Patient denies any fever, chills, chest pain, shortness of breath or calf pain. There are no new illness or injuries to report since last seen in the office. No changes in medications, allergies, social or family history.       PHYSICAL EXAM:    Visit Vitals      BP  116/77     Pulse  71     Temp  97.6 ??F (36.4 ??C) (Oral)     Resp  16     Ht  6' (1.829 m)     Wt  214 lb (97.1 kg)     SpO2  98%        BMI  29.02 kg/m??        The patient is a well-developed, well-nourished male    in no acute distress.  The patient is alert and oriented times three.  Mood and affect are normal.   LYMPHATIC: lymph nodes are not enlarged and are within normal limits   SKIN: normal in color and non tender to palpation. There are no bruises or abrasions noted.    NEUROLOGICAL: Motor sensory exam is within normal limits. Reflexes are equal bilaterally. There is normal sensation to pinprick and light touch   MUSCULOSKELETAL:      Examination  Left Ankle/Foot     Skin   Intact     Swelling  mild     Dorsiflexion  5     Plantarflexion  15     Deformity  -     Inversion laxity  Not assessed     Anterior drawer  Not assessed     Medial tenderness  ++     Lateral tenderness  -     Heel cord  Intact     Sensation  Intact     Bunion  -     Toe nails  Normal     Capillary refill  Normal  IMAGING:    MRI Left Ankle   IMPRESSION:   ??   Incomplete nondisplaced fracture of the distal tibia at the base of the medial   malleolus described above.   ??   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 Large Achilles   enthesophyte.   -Plantar calcaneal spur.   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   ??   Additional chronic bony changes as above. No other significant findings.   ??      IMPRESSION:               ICD-10-CM  ICD-9-CM             1.  Foot pain, left  M79.672  729.5  AMB POC XRAY, FOOT; COMPLETE, 3+ VIEW     2.  Closed nondisplaced fracture of medial malleolus of left tibia, initial encounter  S82.55XA  824.0  AMB SUPPLY ORDER                traMADol (ULTRAM) 50 mg tablet            PLAN:    Pt with left nondisplaced distal tibia fracture shown on MRI.   He refused xrays today in the office.    He was taken out of the boot and put into a short leg cast today.   Pt given crutches and instructed to be NWB LLE. Talked about this being important to help prevent this fracture from displacing.   He was also given an order for knee scooter to help with ambulation   Pt given a short course of tramadol to help with pain. Patient given pain medication for short term acute pain relief. Goal is to treat patient according to above plan and to ultimately have patient off all pain medications once appropriate. If chronic  pain management is required beyond what is expected for current orthopedic problem, will refer patient to pain management. PMP was reviewed and will be reviewed with every medication refill request.    Pt was  notified Dr. Burt Knack will be out next week and pt is leaving week after for a trip. Pt will follow up in the office as soon as he gets back in town.   RTC 2 weeks for xrays and evaluation with Dr. Burt Knack.      Reviewed MRI with Dr. Andrey Campanile today who agrees with treatment plan         Daniel Nones, PA-C   [facility]opa[facility] and Spine Specialist

## 2018-06-09 NOTE — Telephone Encounter (Signed)
Patient was called to schedule appointment with Pa Orie.    Patient said that he cut off his cast and that it was only swollen. That he was worried that the cast had cut him , due to he is a Diabetic.    Patient said he has a boot that he is going to place on his Left Foot.     Patient was offered an appointment to see Pa Orie, but the patient declined the appointment. [place] Sp[place]iel[place]. That his foot is okay with the Boot . That he will see Dr.Caines on the appointment he already has scheduled on 06/17/18.

## 2018-06-09 NOTE — Telephone Encounter (Signed)
Please place the patient on my schedule.    Luna FuseNancy A Meerab Maselli, PA-C  06/09/2018   1:24 PM

## 2018-06-09 NOTE — Telephone Encounter (Signed)
Niel Hummereresa Witherow ( on hippa) called and said that the patient was seen on 06/05/18 by Delfina RedwoodPa Winkler for a Left Foot Injury. That a Cast was placed on his foot.     Pa Winkler referred the patient over to Dr.Caines. The patient has an appointment to see Dr.Caines on 06/17/18.      Ms.Eakes said that the patient Cast is rubbing and the other side of the bone where the patient had injured his foot is now hurting , and burning.     Ms.Schmieder said the patient is a Diabetic and she is concerned.   Ms. Sedonia SmallRoberson is requesting the patient be seen today.       Ms. Ottis StainRoberson tel. [phone] or [phone].

## 2018-06-09 NOTE — Telephone Encounter (Signed)
Please ask Harriett Sineancy if she could add him to her schedule. In the mean time he should elevate and stay off his foot as much as possible.

## 2018-06-17 ENCOUNTER — Ambulatory Visit: Attending: Foot and Ankle Surgery | Primary: Registered Nurse

## 2018-06-17 ENCOUNTER — Ambulatory Visit
Admit: 2018-06-17 | Discharge: 2018-06-17 | Payer: PRIVATE HEALTH INSURANCE | Attending: Foot and Ankle Surgery | Primary: Registered Nurse

## 2018-06-17 DIAGNOSIS — S82875D Nondisplaced pilon fracture of left tibia, subsequent encounter for closed fracture with routine healing: Secondary | ICD-10-CM

## 2018-06-17 NOTE — Patient Instructions (Signed)
Lower Leg Stress Fracture: Care Instructions  Your Care Instructions    A stress fracture of the lower leg is a tiny hairline crack in the bone. It may happen in the tibia (shinbone), the larger bone of the lower leg. Or it may happen in the fibula, the smaller bone of the lower leg.  A lower leg hairline fracture often is caused by ongoing overuse, such as from regular long-distance running. Your doctor may have put your leg in a brace, splint, or cast to allow it to heal or to keep it stable until you see another doctor. It may take a few weeks to a few months to heal. It can be frustrating to slow down or stop your activities. But going back to exercise too soon can cause the injury to get worse and make healing take even longer.  You heal best when you take good care of yourself. Eat a variety of healthy foods, and don't smoke.  Follow-up care is a key part of your treatment and safety. Be sure to make and go to all appointments, and call your doctor if you are having problems. It's also a good idea to know your test results and keep a list of the medicines you take.  How can you care for yourself at home?  ?? Rest your leg as much as you can for as long as your doctor advises. Follow your doctor's instructions for using crutches, a brace, or a splint.  ?? Ask your doctor before you change your activity level or go back to your usual exercise. Too much activity or weight on the leg with the fracture can cause it to get worse.  ?? Put ice or a cold pack on your leg for 10 to 20 minutes at a time. Put a thin cloth between the ice and your skin.  ?? Be safe with medicines. Read and follow all instructions on the label.  ? If the doctor gave you a prescription medicine for pain, take it as prescribed.  ? If you are not taking a prescription pain medicine, ask your doctor if you can take an over-the-counter medicine.  ?? Do exercises or physical therapy as directed by your doctor.  When should you call for help?   Call 911 anytime you think you may need emergency care. For example, call if:  ?? ?? You have severe pain.   ??Call your doctor now or seek immediate medical care if:  ?? ?? You have new weakness in your leg.   ?? ?? You fall on or hurt the injured leg.   ?? ?? Your pain gets much worse.   ?? ?? Your leg or foot is cold or pale or changes color.   ??Watch closely for changes in your health, and be sure to contact your doctor if:  ?? ?? You do not get better as expected.   Where can you learn more?  Go to [email or web address]/[email or web address].  Enter N176 in the search box to learn more about "Lower Leg Stress Fracture: Care Instructions."  Current as of: September 12, 2017  Content Version: 11.9  ?? 2006-2018 [employer]. Care instructions adapted under license by [employer] (which disclaims liability or warranty for this information). If you have questions about a medical condition or this instruction, always ask your healthcare professional. [employer] disclaims any warranty or liability for your use of this information.

## 2018-06-17 NOTE — Progress Notes (Signed)
AMBULATORY PROGRESS NOTE      Patient: Kyle Mueller             MRN: [id number]     SSN: [id number] Body mass index is 29.02 kg/m??.  Date of Birth: August 05, 1956     AGE: 62 y.o.       SEX: male    PCP: Cordelia Poche, NP     IMPRESSION/DIAGNOSIS AND TREATMENT PLAN     DIAGNOSES  1. Closed nondisplaced pilon fracture of left tibia with routine healing, subsequent encounter    2. Non-compliant patient    3. Left ankle pain, unspecified chronicity        Orders Placed This Encounter   ??? [[id number]] Ankle Min 3V   ??? DEXA BONE DENSITY STUDY AXIAL   ??? VITAMIN D, 25 HYDROXY   ??? PR CLOSED RX WEIGHT BEAR DIST TIBIA      Kyle Mueller understands his diagnoses and the proposed plan.     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      He understands to remains compliant.  He uses a knee scooter, crutches, and CAM boot as much as possible.      He understands that we need to watch him closely and make sure that he does not develop Charcot neuroarthropathy or any destructive process across the ankle.             Plan:    1) Lab Work: CMP, Vitamin D levels  2) Bone density scan.   3) Continue wearing the CAM boot as directed.  4) Continue activity modification as directed.      RTO - 3 weeks and PLEASE OBTAIN X-RAYS OF: left ankle and tib/fib 3 VIEWS      HPI AND EXAMINATION     Kyle Mueller IS A 62 y.o. male who presents to my outpatient office for follow up of posterior tibial tendinitis of the LLE and post-traumatic osteoarthritis of the left ankle. At the last visit, I ordered an MRI of the left ankle, provided an order for a short left CAM walker boot, prescribed Voltaren 75 mg, and instructed the patient to continue activity modification as directed.     Kyle Mueller states that he is doing well. His wife, who accompanies him to the office today, notes that his PCP wanted to do a bone density scan  on the patient, but notes that they were unable to, due to the cast. MRI results have been reviewed with the patient. 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 He notes that this is why he took the cast off himself, as he thought the cotton was irritating his foot and causing pain. Kyle Mueller denies smoking, and reports that he has not smoked cigarettes in many years.    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 He is not currently taking blood thinners, other than ASA.       The patient is a 62 year old male who I first saw at an office visit on 05/21/2018.  Because of continued pain, discomfort, and swelling to the ankle, and because of his poor motion, and my impression he had OA of the ankle, subtalar, and transverse tarsal joint regions as well as the navicular, and because it is new pain, I ordered MRI.  He had followed up on 06/05/2018 with  Kyle Mueller.  The patient was placed in a cast.  He has a nondisplaced distal tibia fracture.  He was placed in a cast and instructed to be nonweightbearing.  He cut his cast off as he was concerned that he has diabetes.  When he was seen by Kyle Mueller, one of the PAs here, the patient refused an x-ray in the office.  He was taken out of his boot and put in a short-leg cast but he cut his cast off at home.     He is here for follow-up.      He had x-rays today, 3-views of left ankle that shows a healing left distal tibia fracture.  The ankle joint is still preserved.  There is some OA changes seen in the anterior portion of the distal tibia and some OA changes in the talonavicular joint as well as some spurs seen at the Achilles tendon on the lateral radiographic x-ray.     I see no dislocation of the ankle joint at this point and no destruction of the ankle joint at this point.  No Charcot neuroarthropathic changes seen at the ankle joint on these x-rays today.      Visit Vitals  BP 147/75   Pulse 71   Temp 97.4 ??F (36.3 ??C) (Oral)    Ht 6' (1.829 m)   Wt 214 lb (97.1 kg)   SpO2 95%   BMI 29.02 kg/m??     Appearance: Alert, well appearing and pleasant patient who is in no distress, oriented to person, place/time, and who follows commands.This patient is accompanied in the examination room by his wife. Dementia: no dementia  Psychiatric: Affect and mood are appropriate. Patient arrives to office via: with assistive device: CAM boot  HEENT: Head normocephalic & atraumatic. Both pupils are round, non icteric sclera              Eye: EOM are intact and sclera are clear               Neck: ROM WNL and JVD neck is not present              Hearings Intact, does not require hearing aid device  Respiratory: Breathing is unlabored without accessory chest muscle use  Cardiovascular/Peripheral Vascular: Normal Pulses to each foot  ??  ANKLE/FOOT left  ??  Gait: in CAM boot  Tenderness: No tenderness to the posterior tibial tendon behind the medial malleolus.   Cutaneous: Minimal swelling over the medial ankle.   Joint Motion: Can DF to neutral with knee flexed and extended                          30 degrees of PF                          Limited ST motion                          Limited inversion.   Joint / Tendon Stability: No Ankle or Subtalar instability or joint laxity.                                             No peroneal sublux ability or dislocation  Alignment: Forefoot, Midfoot, Hindfoot WNL.   Neuro  Motor/Sensory: NL/NL.  Vascular: NL foot/ankle pulses.  Lymphatics: No extremity lymphedema, No calf swelling, no tenderness to calf muscles.    CHART REVIEW     Past Medical History:   Diagnosis Date   ??? Asthma    ??? Diabetes (HCC)     CONTROLLED W/FOOD AND DIET   ??? 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 Erectile dysfunction    ??? Hypertension      Current Outpatient Medications   Medication Sig   ??? aspirin (ASPIRIN) 325 mg tablet Take 325 mg by mouth.   ??? benzonatate (TESSALON) 200 mg capsule Take 200 mg by mouth.    ??? glucose blood VI test strips (ONETOUCH ULTRA BLUE TEST STRIP) strip TEST BLOOD GLUCOSE UP TO  QID   ??? cyclobenzaprine (FLEXERIL) 10 mg tablet Take 5-10 mg by mouth.   ??? insulin glargine-lixisenatide (SOLIQUA 100/33) 100 unit-33 mcg/mL inpn 22 Units by SubCUTAneous route.   ??? metoprolol succinate (TOPROL-XL) 25 mg XL tablet Take 25 mg by mouth.   ??? omeprazole (PRILOSEC) 40 mg capsule Take 40 mg by mouth.   ??? rosuvastatin (CRESTOR) 20 mg tablet Take 20 mg by mouth.   ??? diclofenac EC (VOLTAREN) 75 mg EC tablet Take 1 Tab by mouth two (2) times daily (with meals).   ??? tadalafil (CIALIS) 5 mg tablet Take 5 mg by mouth daily as needed.   ??? vardenafil (LEVITRA) 20 mg tablet Take 20 mg by mouth daily as needed for 10 doses.     No current facility-administered medications for this visit.      Allergies   Allergen Reactions   ??? Augmentin [Amoxicillin-Pot Clavulanate] Anaphylaxis, Shortness of Breath and Palpitations     BP DROPS   ??? Codeine Other (comments)     COMBATIVE   ??? Dilaudid [Hydromorphone (Bulk)] Other (comments)     HALLUCINATIONS   ??? Percocet [Oxycodone-Acetaminophen] Nausea and Vomiting     Past Surgical History:   Procedure Laterality Date   ??? CARDIAC SURG PROCEDURE UNLIST  05/25/2016   ??? HX BACK SURGERY  1991    L-4 L-5 DISCECTOMY   ??? HX MOHS PROCEDURES  2009    OBICI   ??? HX ORTHOPAEDIC      BILAT ACHILLES TENDON REPAIR     Social History     Occupational History   ??? Not on file   Tobacco Use   ??? Smoking status: Former Smoker     Packs/day: 1.00     Years: 25.00     Pack years: 25.00     Last attempt to quit: 12/24/1996     Years since quitting: 21.4   ??? Smokeless tobacco: Never Used   Substance and Sexual Activity   ??? Alcohol use: No     Frequency: Never     Comment: ocassional ( did not answer)    ??? Drug use: No   ??? Sexual activity: Yes     Partners: Female     Comment: E.D.     Family History   Problem Relation Age of Onset   ??? Inflammatory Bowel Dz Mother    ??? Cancer Father         LUNG CA    ??? Kidney Disease Sister         RENAL FAILURE   ??? Diabetes Sister         REVIEW OF SYSTEMS : 06/17/2018  ALL BELOW ARE Negative except : SEE HPI  Constitutional: Negative for fever, chills and weight loss. Neg Weight Loss  Cardiovascular: Negative for chest pain, claudication and leg swelling. SOB, DOE   Gastrointestinal/Urological: Negative for  pain, N/V/D/C, Blood in stool or urine,dysuria                         Hematuria, Incontinence, pelvic pain  Musculoskeletal: see HPI.  Neurological: Negative for dizziness and weakness, headaches,Visual Changes             Confusion,  Or Seizures,   Psychiatric/Behavioral: Negative for depression, memory loss and substance abuse.   Extremities:  Negative for hair changes, rash or skin lesion changes.  Hematologic: Negative for Bleeding problems, bruising, pallor or swollen lymph nodes.  Peripheral Vascular: No calf pain, vascular vein tenderness to calf pain              No calf throbbing, posterior knee throbbing pain     DIAGNOSTIC IMAGING     PLEASE SEE SECTION OF HPI FOR THE DIAGNOSTIC IMAGING INFO     MRI Results (most recent):  Results from Hospital Encounter encounter on 06/05/18   MRI ANKLE LT WO CONT    Narrative EXAMINATION: MRI left ankle without contrast    INDICATION: Left ankle pain, posterior tibialis injury    COMPARISON: None    TECHNIQUE: Multiplanar multiecho MRI sequences of the left ankle performed  without contrast.    FINDINGS:    Bones/joints:  -There is a nondisplaced incomplete fracture at the base of the medial malleolus  extending to the anterior cortex, with extensive surrounding marrow edema.  -Large Achilles enthesophyte with suspected surgical anchors at the region of  the Achilles insertion. Large plantar calcaneal spur.  -Talonavicular degenerative changes with a suspected os supranaviculare.  Prominent anterior processes of the calcaneus with anterior subtalar joint facet  degenerative change.  -Large os peroneum.    Ligaments:   -Calcaneofibular ligament appears intact. Anterior and posterior talofibular  ligaments appear intact. Anterior and posterior tibiofibular ligaments appear  intact.  -Deltoid ligament with intrasubstance signal in the deep fibers, otherwise felt  to be largely intact. Spring ligament appears intact. Lisfranc ligament appears  largely intact.    Tendons:  -Mild tendinopathy of the distal Achilles tendon characterized by thickening,  involving roughly 3 cm length of the tendon, with suspected postsurgical  susceptibility artifact at the insertion.  -The peroneal tendons are intact.  -The flexor tendons at the ankle are intact.  -Extensor tendons at the ankle are intact.    Soft tissues: Sinus tarsi fat signal preserved. Plantar fascia intact. Extensive  fatty atrophy of the abductor digiti minimi. Trace subtalar joint effusion.  Superficial edema about the ankle.      Impression IMPRESSION:    Incomplete nondisplaced fracture of the distal tibia at the base of the medial  malleolus described above.    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 Large Achilles  enthesophyte.  -Plantar calcaneal spur.  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    Additional chronic bony changes as above. No other significant findings.    Regarding distal tibia fracture, patient was told to report to ordering  clinician's office which is nearby. Technologist attempted to call ordering  physician's office but without answer at the time of this dictation.             DIAGNOSTIC IMAGING  [facility] : INTERPRETATION BY RADIOLOGIST  Results from [facility]   GENERAL CHEMISTRY  Component Name  06/06/2018 06/06/2018 03/08/2018 03/07/2018 02/14/2018 02/14/2018 09/30/2017 09/30/2017 09/30/2017 07/26/2017 05/28/2017 05/28/2017 05/28/2017 05/15/2017 12/27/2016 12/27/2016 12/27/2016 11/19/2016 07/12/2016 06/19/2016 06/13/2016 06/11/2016  06/11/2016 06/06/2016 06/05/2016 06/05/2016 06/05/2016 06/04/2016 06/04/2016 06/04/2016 06/03/2016 06/03/2016 06/03/2016 06/03/2016 06/03/2016 06/02/2016 06/02/2016 06/02/2016 06/02/2016 06/02/2016 06/02/2016 06/01/2016 06/01/2016 06/01/2016 06/01/2016 06/01/2016 06/01/2016 06/01/2016 06/01/2016 06/01/2016 ?? ??   ?? ?? ?? ?? ?? ?? ?? ?? ?? ?? ?? ?? ?? 4.1 ?? ?? ?? ?? ?? ?? ?? ?? ?? ?? ?? ?? ?? ?? ?? ?? ?? ?? ?? ?? ?? ?? ?? ?? ?? ?? ?? ?? ?? ?? ?? ?? ?? ?? ?? ?? Load Older Results   ?? ?? ?? ?? ?? ?? ?? ?? ?? ?? ?? ?? ?? 100 ?? ?? ?? ?? ?? ?? ?? ?? ?? ?? ?? ?? ?? ?? ?? ?? ?? ?? ?? ?? ?? ?? ?? ?? ?? ?? ?? ?? ?? ?? ?? ?? ?? ?? ?? ??    ?? ?? ?? ?? ?? ?? ?? ?? ?? ?? ?? ?? ?? 4.2 (L) ?? ?? ?? ?? ?? ?? ?? ?? ?? ?? ?? ?? ?? ?? ?? ?? ?? ?? ?? ?? ?? ?? ?? ?? ?? ?? ?? ?? ?? ?? ?? ?? ?? ?? ?? ??    ?? ?? 29 28 ?? 25 26 ?? ?? 24 24 ?? ?? 28.0 26 ?? ?? ?? 28 28 28  ?? 28 26 ?? ?? 26 ?? 27 ?? ?? 25 ?? ?? ?? ?? ?? 24 ?? ?? ?? 22 ?? ?? ?? ?? ?? ?? 22 ??    ?? ?? 276 (H) 358 (H) ?? 330 (H) 268 (H) ?? ?? 242 (H) 205 (H) ?? ?? 256 (H) 172 (H) ?? ?? ?? 208 (H) 228 (H) 252 (H) ?? 142 (H) 145 (H) ?? ?? 150 (H) ?? 149 (H) ?? ?? 231 (H) ?? ?? ?? ?? ?? 131 (H) ?? ?? ?? 212 (H) ?? ?? ?? ?? ?? ?? 135 (H) ??    ?? ?? 13 16 ?? 19 17 ?? ?? 13 20 ?? ?? 17 17 ?? ?? ?? 8 13 11  ?? 10 13 ?? ?? 14 ?? 16 ?? ?? 15 ?? ?? ?? ?? ?? 14 ?? ?? ?? 11 ?? ?? ?? ?? ?? ?? 9 ??    ?? ?? ?? ?? ?? ?? ?? ?? ?? ?? ?? ?? ?? 0.7 (L) ?? ?? ?? ?? ?? ?? ?? ?? ?? ?? ?? ?? ?? ?? ?? ?? ?? ?? ?? ?? ?? ?? ?? ?? ?? ?? ?? ?? ?? ?? ?? ?? ?? ?? ?? ??    ?? ?? ?? ?? ?? ?? ?? ?? ?? ?? ?? ?? ?? 138 ?? ?? ?? ?? ?? ?? ?? ?? ?? ?? ?? ?? ?? ?? ?? ?? ?? ?? ?? ?? ?? ?? ?? ?? ?? ?? ?? ?? ?? ?? ?? ?? ?? ?? ?? ??    ?? ?? 4.0 4.3 ?? 4.6 4.2 ?? ?? 4.1 3.7 ?? ?? ?? 3.6 ?? ?? ?? 3.7 4.2 4.4 ?? 4.2 4.9 4.4 ?? 3.7 3.8 3.7 ?? ?? 4.2 ?? ?? ?? 4.2 ?? 3.7 ?? ?? ?? 4.2 ?? ?? ?? ?? 4.1 ?? 4.4 ?? Load Older Results   ?? ?? 136 138 ?? 138 138 ?? ?? 140 142 ?? ?? ?? 136 ?? ?? ?? 138 136 136 ?? 137 132 (L) ?? ?? 137 ?? 135 ?? ??  136 ?? ?? ?? ?? ?? 139 ?? ?? ?? 135 ?? ?? ?? ?? ?? ?? 138 ??    ?? ?? 99 100 ?? 98 97 (L) ?? ?? 100 102 ?? ?? ?? 95 (L) ?? ?? ?? 99 97 (L) 98 ?? 97 (L) 94 (L) ?? ?? 97 (L) ?? 94 (L) ?? ?? 98 ?? ?? ?? ?? ?? 101 ?? ?? ?? 96 (L) ?? ?? ?? ?? ?? ?? 98 ??    ?? ?? 9.1 9.4 ?? 9.3 9.1 ?? ?? 8.8 9.0 ?? ?? ?? 9.5 ?? ?? ?? 9.2 8.9 8.7 ?? 8.6 9.1 ?? ?? 8.5 ?? 8.5 ?? ?? 8.1 (L) ?? ?? ?? ?? ?? 7.7 (L) ?? ?? ?? 8.0 (L) ?? ?? ?? ?? ?? ?? 7.9 (L) ??     ?? ?? 0.6 (L) 0.7 (L) ?? 0.7 (L) 0.6 (L) ?? ?? 0.8 0.6 (L) ?? ?? ?? 0.5 (L) ?? ?? ?? 0.6 (L) 0.6 (L) 0.6 (L) ?? 0.6 (L) 0.8 ?? ?? 0.6 (L) ?? 0.6 (L) ?? ?? 0.7 (L) ?? ?? ?? ?? ?? 0.5 (L) ?? ?? ?? 0.6 (L) ?? ?? ?? ?? ?? ?? 0.7 (L) ??    ?? ?? >60.0 >60.0 ?? >60.0 >60.0 ?? ?? >60.0 >60.0 ?? ?? ?? >60.0 ?? ?? ?? >60.0 >60.0 >60.0 ?? >60.0 >60.0 ?? ?? >60.0 ?? >60.0 ?? ?? >60.0 ?? ?? ?? ?? ?? >60.0 ?? ?? ?? >60.0 ?? ?? ?? ?? ?? ?? >60.0 ??    ?? ?? >60.0 >60.0 ?? >60.0 >60.0 ?? ?? >60.0 >60.0 ?? ?? ?? >60.0 ?? ?? ?? >60.0 >60.0 >60.0 ?? >60.0 >60.0 ?? ?? >60.0 ?? >60.0 ?? ?? >60.0 ?? ?? ?? ?? ?? >60.0 ?? ?? ?? >60.0 ?? ?? ?? ?? ?? ?? >60.0 ??    ?? ?? 8.1 9.9 ?? 15.0 15.0 ?? ?? 16.0 16.0 ?? ?? ?? 14.8 ?? ?? ?? 11.5 11.1 10.0 ?? 11.9 12.0 ?? ?? 14.0 ?? 14.0 ?? ?? 13.0 ?? ?? ?? ?? ?? 14.0 ?? ?? ?? 17.0 ?? ?? ?? ?? ?? ?? 18.0 ??    ?? ?? ?? ?? 2.2 ?? ?? ?? ?? ?? ?? ?? ?? ?? ?? ?? ?? ?? ?? ?? ?? ?? ?? ?? ?? 2.1 ?? ?? ?? 2.2 ?? ?? 2.4 ?? ?? ?? 2.5 ?? 2.4 ?? ?? ?? ?? 2.3 ?? 2.1 ?? ?? ?? 2.1 Load Older Results   ?? ?? ?? ?? ?? 4.6 4.2 ?? ?? ?? 4.1 ?? ?? ?? ?? 4.3 ?? ?? ?? ?? ?? ?? ?? ?? ?? ?? ?? ?? ?? ?? ?? ?? ?? ?? ?? ?? ?? ?? ?? ?? ?? ?? ?? ?? ?? ?? ?? ?? ?? ??    ?? ?? ?? ?? ?? 17 15 ?? ?? ?? 9 ?? ?? ?? ?? 10 ?? ?? ?? ?? ?? ?? ?? ?? ?? ?? ?? ?? ?? ?? ?? ?? ?? ?? ?? ?? ?? ?? ?? ?? ?? ?? ?? ?? ?? ?? ?? ?? ?? ??    ?? ?? ?? ?? ?? 27 23 ?? ?? ?? 23 ?? ?? ?? ?? 17 ?? ?? ?? ?? ?? ?? ?? ?? ?? ?? ?? ?? ?? ?? ?? ?? ?? ?? ?? ?? ?? ?? ?? ?? ?? ?? ?? ?? ?? ?? ?? ?? ?? ??    ?? ?? ?? ?? ?? 0.9 0.9 ?? ?? ?? 0.7 ?? ?? ?? ?? 1.1 ?? ?? ?? ?? ?? ?? ?? ?? ?? ?? ?? ?? ?? ?? ?? ?? ?? ?? ?? ?? ?? ?? ?? ?? ?? ?? ?? ?? ?? ?? ?? ?? ?? ??    ?? ?? ?? ?? ??  144 (H) 135 (H) ?? ?? ?? 129 (H) ?? ?? ?? ?? 154 (H) ?? ?? ?? ?? ?? ?? ?? ?? ?? ?? ?? ?? ?? ?? ?? ?? ?? ?? ?? ?? ?? ?? ?? ?? ?? ?? ?? ?? ?? ?? ?? ?? ?? ??    ?? ?? ?? ?? ?? 2.3 2.6 ?? ?? ?? 2.5 ?? ?? ?? ?? 3.4 ?? ?? ?? ?? ?? ?? ?? ?? ?? ?? ?? ?? ?? ?? ?? ?? ?? ?? ?? ?? ?? ?? ?? ?? ?? ?? ?? ?? ?? ?? ?? ?? ?? ??    ?? ?? ?? ?? ?? 2.0 1.6 ?? ?? ?? 1.6 ?? ?? ?? ?? 1.3 ?? ?? ?? ?? ?? ?? ?? ?? ?? ?? ?? ?? ?? ?? ?? ?? ?? ?? ?? ?? ?? ?? ?? ?? ?? ?? ?? ?? ?? ?? ?? ?? ?? ??    ?? ?? ?? ?? ?? 6.9 6.8 ?? ?? ?? 6.6 ?? ?? ?? ?? 7.7 ?? ?? ?? ?? ?? ?? ?? ?? ?? ?? ?? ?? ?? ?? ?? ?? ?? ?? ?? ?? ?? ?? ?? ?? ?? ?? ?? ?? ?? ?? ?? ?? ?? ?? Load Older Results   ?? 10.6 (H) ?? ?? ?? ?? ?? 9.3 (H) ?? ?? ?? 8.6 (H) ?? ?? ?? ?? ?? 9.6 (H) ?? ?? ?? 7.5 (H) ?? ?? ?? ?? ?? ?? ?? ?? ?? ?? ?? ?? ?? ?? ?? ?? ?? ?? ?? ?? ?? ?? ?? ?? ?? ?? ?? ??     ?? 259 (H) ?? ?? ?? ?? ?? 221 (H) ?? ?? ?? 200 (H) ?? ?? ?? ?? ?? 230 (H) ?? ?? ?? 169 (H) ?? ?? ?? ?? ?? ?? ?? ?? ?? ?? ?? ?? ?? ?? ?? ?? ?? ?? ?? ?? ?? ?? ?? ?? ?? ?? ?? ??    128 ?? ?? ?? ?? ?? ?? ?? 188 ?? ?? ?? 176 ?? ?? ?? ?? ?? ?? ?? ?? ?? ?? ?? ?? ?? ?? ?? ?? ?? ?? ?? ?? ?? ?? ?? ?? ?? ?? ?? ?? ?? ?? ?? ?? ?? ?? ?? ?? ??    82 ?? ?? ?? ?? ?? ?? ?? 95 ?? ?? ?? 115 ?? ?? ?? ?? ?? ?? ?? ?? ?? ?? ?? ?? ?? ?? ?? ?? ?? ?? ?? ?? ?? ?? ?? ?? ?? ?? ?? ?? ?? ?? ?? ?? ?? ?? 93 ?? ??    53 ?? ?? ?? ?? ?? ?? ?? 53 ?? ?? ?? 55 ?? ?? ?? ?? ?? ?? ?? ?? ?? ?? ?? ?? ?? ?? ?? ?? ?? ?? ?? ?? ?? ?? ?? ?? ?? ?? ?? ?? ?? ?? ?? ?? ?? ?? ?? ?? ??    2.4 ?? ?? ?? ?? ?? ?? ?? 3.5 ?? ?? ?? 3.2 ?? ?? ?? ?? ?? ?? ?? ?? ?? ?? ?? ?? ?? ?? ?? ?? ?? ?? ?? ?? ?? ?? ?? ?? ?? ?? ?? ?? ?? ?? ?? ?? ?? ?? ?? ?? ??    58 ?? ?? ?? ?? ?? ?? ?? 116 (H) ?? ?? ?? 98 ?? ?? ?? ?? ?? ?? ?? ?? ?? ?? ?? ?? ?? ?? ?? ?? ?? ?? ?? ?? ?? ?? ?? ?? ?? ?? ?? ?? ?? ?? ?? ?? ?? ?? ?? ?? ??    16 ?? ?? ?? ?? ?? ?? ??  19 ?? ?? ?? 23 ?? ?? ?? ?? ?? ?? ?? ?? ?? ?? ?? ?? ?? ?? ?? ?? ?? ?? ?? ?? ?? ?? ?? ?? ?? ?? ?? ?? ?? ?? ?? ?? ?? ?? ?? ?? ?? Load Older Results   ?? ?? ?? ?? ?? ?? ?? ?? ?? ?? ?? ?? ?? ?? ?? 0.3 ?? ?? ?? ?? ?? ?? ?? ?? ?? ?? ?? ?? ?? ?? ?? ?? ?? ?? ?? ?? ?? ?? ?? ?? ?? ?? ?? ?? ?? ?? ?? ?? ?? ??    ?? ?? ?? ?? ?? ?? ?? ?? ?? ?? ?? ?? ?? ?? ?? ?? <20 ?? ?? ?? ?? ?? ?? ?? ?? ?? ?? ?? ?? ?? ?? ?? ?? ?? ?? ?? ?? ?? ?? ?? ?? ?? ?? ?? ?? ?? ?? ?? ?? ??    ?? ?? ?? ?? ?? ?? ?? ?? ?? ?? ?? ?? ?? ?? ?? ?? ?? ?? ?? ?? ?? ?? ?? ?? ?? ?? ?? ?? ?? ?? 4.6 ?? ?? ?? 4.3 (L) ?? ?? ?? ?? ?? 4.2 (L) ?? 4.3 (L) ?? 4.2 (L) ?? ?? ?? ?? ??    ?? ?? ?? ?? ?? ?? ?? ?? ?? ?? ?? ?? ?? ?? ?? ?? ?? ?? ?? ?? ?? ?? ?? ?? ?? ?? ?? ?? ?? ?? ?? ?? ?? 2.8 ?? ?? ?? ?? ?? 2.5 ?? ?? ?? ?? ?? ?? ?? ?? ?? ??    ?? ?? ?? ?? ?? ?? ?? ?? ?? ?? ?? ?? ?? ?? ?? ?? ?? ?? ?? ?? ?? ?? ?? ?? ?? ?? ?? ?? ?? ?? ?? ?? ?? ?? ?? ?? ?? ?? ?? ?? ?? ?? ?? ?? ?? ?? ?? ?? ?? ??    ?? ?? ?? ?? ?? ?? ?? ?? ?? ?? ?? ?? ?? ?? ?? ?? ?? ?? ?? ?? ?? ?? ?? ?? ?? ?? ?? ?? ?? ?? ?? ?? ?? ?? ?? ?? ?? ?? ?? ?? ?? ?? ?? ?? ?? ?? ?? ?? ?? ??    ?? ?? ?? ?? ?? ?? ?? ?? ?? ?? ?? ?? ?? ?? ?? ?? ?? ?? ?? ?? ?? ?? ?? ?? ?? ?? ?? ?? ?? ?? ?? ?? ?? ?? ?? ?? ?? ?? ?? ?? ?? ?? ?? ?? ?? ?? ?? ?? ?? ??    ?? ?? ?? ?? ?? ??                                                      Written by Hoyt Koch, [employer]ribe[employer], as dictated by Dr. Silvano Bilis. I, Dr. Silvano Bilis, confirm that all documentation is accurate.

## 2018-06-17 NOTE — Progress Notes (Addendum)
Progress Notes by Netta Corrigan, MD at 06/17/18 [phone]                Author: Netta Corrigan, MD  Service: --  Author Type: Physician       Filed: 06/19/18 1528  Encounter Date: 06/17/2018  Status: Signed          Editor: Netta Corrigan, MD (Physician)          Related Notes: Original Note by Netta Corrigan, MD (Physician) filed at 06/17/18 0835               AMBULATORY PROGRESS NOTE         Patient: Kyle Mueller              MRN: [id number]     SSN:  [id number] Body mass index is 29.02 kg/m??.   Date of Birth: May 21, 1956     AGE:  62 y.o.       SEX: male      PCP: Cordelia Poche, NP         IMPRESSION/DIAGNOSIS AND TREATMENT PLAN        DIAGNOSES      1.  Closed nondisplaced pilon fracture of left tibia with routine healing, subsequent encounter      2.  Non-compliant patient         3.  Left ankle pain, unspecified chronicity              Orders Placed This Encounter        ?  [[id number]] Ankle Min 3V     ?  DEXA BONE DENSITY STUDY AXIAL     ?  VITAMIN D, 25 HYDROXY        ?  PR CLOSED RX WEIGHT BEAR DIST TIBIA         Kyle Mueller understands his  diagnoses and the proposed plan.       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        He understands to remains compliant.  He uses a knee scooter, crutches, and CAM boot as much as possible.        He understands that we need to watch him closely and make sure that he does not develop Charcot neuroarthropathy or any destructive process across the ankle.                  Plan:      1) Lab Work: CMP, Vitamin D levels   2) Bone density scan.    3) Continue wearing the CAM boot as directed.   4) Continue activity modification as directed.        RTO - 3 weeks and PLEASE OBTAIN X-RAYS OF: left ankle and tib/fib 3 VIEWS          HPI AND EXAMINATION        Kyle Mueller IS A 62 y.o.  male who presents to my outpatient office for  follow up of posterior tibial tendinitis of the LLE and post-traumatic osteoarthritis of the left ankle. At the last visit, I ordered an MRI of the left  ankle, provided an order for a short left CAM walker boot, prescribed Voltaren 75 mg, and instructed the patient to continue activity modification as directed.       Kyle Mueller states that he is  doing well. His wife, who accompanies him to the office today, notes that his PCP wanted to do a bone density scan on the patient, but notes that they were unable to, due to the cast. MRI results have been reviewed with  the patient. 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 He notes that this is why he took the cast off himself, as he thought the cotton was irritating his foot and causing pain. Kyle Mueller denies smoking, and reports that he has not smoked cigarettes  in many years.      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 He is not currently taking blood thinners, other than ASA.          The patient is a 62 year old male who I first saw at an office visit on 05/21/2018.  Because of continued pain, discomfort, and swelling to the ankle, and because of his poor motion, and my impression he had OA of the ankle, subtalar, and transverse tarsal  joint regions as well as the navicular, and because it is new pain, I ordered MRI.  He had followed up on 06/05/2018 with Wilber Oliphant.  The patient was placed in a cast.  He has a nondisplaced distal tibia fracture.  He was placed in a cast and instructed  to be nonweightbearing.  He cut his cast off as he was concerned that he has diabetes.  When he was seen by Wilber Oliphant, one of the PAs here, the patient refused an x-ray in the office.  He was taken out of his boot and put in a short-leg cast but  he cut his cast off at home.       He is here for follow-up.        He had x-rays today, 3-views of left ankle that shows a healing left distal tibia fracture.  The ankle joint is still preserved.   There is some OA changes seen in the anterior portion of the distal tibia and some OA changes in the talonavicular joint  as well as some spurs seen at the Achilles tendon on the lateral radiographic x-ray.       I see no dislocation of the ankle joint at this point and no destruction of the ankle joint at this point.  No Charcot neuroarthropathic changes seen at the ankle joint on these x-rays today.        Visit Vitals      BP  147/75     Pulse  71     Temp  97.4 ??F (36.3 ??C) (Oral)     Ht  6' (1.829 m)     Wt  214 lb (97.1 kg)     SpO2  95%        BMI  29.02 kg/m??        Appearance: Alert, well appearing and pleasant patient who is in no distress, oriented to person, place/time, and  who follows commands.This patient is accompanied in the examination room by his wife. Dementia: no dementia   Psychiatric: Affect and mood are appropriate. Patient arrives to office via: with assistive device: CAM boot   HEENT: Head normocephalic & atraumatic. Both pupils are round, non icteric sclera               Eye: EOM are intact and sclera are clear                Neck: ROM WNL and JVD neck is  not present               Hearings Intact, does not require hearing aid device   Respiratory: Breathing is unlabored without accessory chest muscle use   Cardiovascular/Peripheral Vascular: Normal Pulses to each foot   ??   ANKLE/FOOT left   ??   Gait: in CAM boot   Tenderness: No tenderness to the posterior tibial tendon behind the medial malleolus.    Cutaneous: Minimal swelling over the medial ankle.    Joint Motion: Can DF to neutral with knee flexed and extended                           30 degrees of PF                           Limited ST motion                           Limited inversion.    Joint / Tendon Stability: No Ankle or Subtalar instability or joint laxity.                                              No peroneal sublux ability or dislocation   Alignment: Forefoot, Midfoot, Hindfoot WNL.    Neuro Motor/Sensory: NL/NL.    Vascular: NL foot/ankle pulses.   Lymphatics: No extremity lymphedema, No calf swelling, no tenderness to calf muscles.        CHART REVIEW          Past Medical History:        Diagnosis  Date         ?  Asthma       ?  Diabetes (HCC)            CONTROLLED W/FOOD AND DIET         ?  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  Erectile dysfunction           ?  Hypertension            Current Outpatient Medications        Medication  Sig         ?  aspirin (ASPIRIN) 325 mg tablet  Take 325 mg by mouth.     ?  benzonatate (TESSALON) 200 mg capsule  Take 200 mg by mouth.     ?  glucose blood VI test strips (ONETOUCH ULTRA BLUE TEST STRIP) strip  TEST BLOOD GLUCOSE UP TO  QID     ?  cyclobenzaprine (FLEXERIL) 10 mg tablet  Take 5-10 mg by mouth.     ?  insulin glargine-lixisenatide (SOLIQUA 100/33) 100 unit-33 mcg/mL inpn  22 Units by SubCUTAneous route.     ?  metoprolol succinate (TOPROL-XL) 25 mg XL tablet  Take 25 mg by mouth.     ?  omeprazole (PRILOSEC) 40 mg capsule  Take 40 mg by mouth.     ?  rosuvastatin (CRESTOR) 20 mg tablet  Take 20 mg by mouth.     ?  diclofenac EC (VOLTAREN) 75 mg EC tablet  Take 1 Tab by mouth two (2) times daily (with meals).     ?  tadalafil (CIALIS) 5 mg tablet  Take 5 mg by mouth daily as needed.         ?  vardenafil (LEVITRA) 20 mg tablet  Take 20 mg by mouth daily as needed for 10 doses.          No current facility-administered medications for this visit.           Allergies        Allergen  Reactions         ?  Augmentin [Amoxicillin-Pot Clavulanate]  Anaphylaxis, Shortness of Breath and Palpitations             BP DROPS         ?  Codeine  Other (comments)             COMBATIVE         ?  Dilaudid [Hydromorphone (Bulk)]  Other (comments)             HALLUCINATIONS         ?  Percocet [Oxycodone-Acetaminophen]  Nausea and Vomiting          Past Surgical History:         Procedure  Laterality  Date          ?  CARDIAC SURG PROCEDURE UNLIST    05/25/2016     ?  HX  BACK SURGERY    1991          L-4 L-5 DISCECTOMY          ?  HX MOHS PROCEDURES    2009          OBICI          ?  HX ORTHOPAEDIC              BILAT ACHILLES TENDON REPAIR          Social History          Occupational History        ?  Not on file       Tobacco Use         ?  Smoking status:  Former Smoker              Packs/day:  1.00         Years:  25.00         Pack years:  25.00         Last attempt to quit:  12/24/1996         Years since quitting:  21.4         ?  Smokeless tobacco:  Never Used       Substance and Sexual Activity         ?  Alcohol use:  No              Frequency:  Never             Comment: ocassional ( did not answer)          ?  Drug use:  No     ?  Sexual activity:  Yes              Partners:  Female             Comment: E.D.          Family History         Problem  Relation  Age of Onset          ?  Inflammatory Bowel Dz  Mother       ?  Cancer  Father                LUNG CA          ?  Kidney Disease  Sister                RENAL FAILURE          ?  Diabetes  Sister                REVIEW OF SYSTEMS : 06/17/2018   ALL BELOW ARE Negative except : SEE HPI        Constitutional: Negative for fever, chills and weight loss. Neg Weight Loss   Cardiovascular: Negative for chest pain, claudication and leg swelling. SOB, DOE    Gastrointestinal/Urological: Negative for  pain, N/V/D/C, Blood in stool or urine,dysuria                         Hematuria, Incontinence, pelvic pain   Musculoskeletal: see HPI.   Neurological: Negative for dizziness and weakness, headaches,Visual Changes             Confusion,  Or Seizures,    Psychiatric/Behavioral: Negative for depression, memory loss and substance abuse.    Extremities:  Negative for hair changes, rash or skin lesion changes.   Hematologic: Negative for Bleeding problems, bruising, pallor or swollen lymph nodes.   Peripheral Vascular: No calf pain, vascular vein tenderness to calf pain               No calf throbbing, posterior knee throbbing pain          DIAGNOSTIC IMAGING        PLEASE SEE SECTION OF HPI FOR THE DIAGNOSTIC IMAGING INFO       MRI Results (most recent):     Results from Hospital Encounter encounter on 06/05/18     MRI ANKLE LT WO CONT           Narrative  EXAMINATION: MRI left ankle without contrast      INDICATION: Left ankle pain, posterior tibialis injury      COMPARISON: None      TECHNIQUE: Multiplanar multiecho MRI sequences of the left ankle performed   without contrast.      FINDINGS:      Bones/joints:   -There is a nondisplaced incomplete fracture at the base of the medial malleolus   extending to the anterior cortex, with extensive surrounding marrow edema.   -Large Achilles enthesophyte with suspected surgical anchors at the region of   the Achilles insertion. Large plantar calcaneal spur.   -Talonavicular degenerative changes with a suspected os supranaviculare.   Prominent anterior processes of the calcaneus with anterior subtalar joint facet   degenerative change.   -Large os peroneum.      Ligaments:   -Calcaneofibular ligament appears intact. Anterior and posterior talofibular   ligaments appear intact. Anterior and posterior tibiofibular ligaments appear   intact.   -Deltoid ligament with intrasubstance signal in the deep fibers, otherwise felt   to be largely intact. Spring ligament appears intact. Lisfranc ligament appears   largely intact.      Tendons:   -Mild tendinopathy of the distal Achilles tendon characterized by thickening,   involving roughly 3 cm length of the tendon, with suspected postsurgical   susceptibility artifact at the insertion.   -The peroneal tendons are intact.   -  The flexor tendons at the ankle are intact.   -Extensor tendons at the ankle are intact.      Soft tissues: Sinus tarsi fat signal preserved. Plantar fascia intact. Extensive   fatty atrophy of the abductor digiti minimi. Trace subtalar joint effusion.   Superficial edema about the ankle.              Impression  IMPRESSION:      Incomplete  nondisplaced fracture of the distal tibia at the base of the medial   malleolus described above.      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 Large Achilles   enthesophyte.   -Plantar calcaneal spur.   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      Additional chronic bony changes as above. No other significant findings.      Regarding distal tibia fracture, patient was told to report to ordering   clinician's office which is nearby. Technologist attempted to call ordering   physician's office but without answer at the time of this dictation.                     DIAGNOSTIC IMAGING  [facility] : INTERPRETATION BY RADIOLOGIST              [facility]      GENERAL CHEMISTRY   Component Name                                                        06/06/2018  06/06/2018  03/08/2018  03/07/2018  02/14/2018  02/14/2018  09/30/2017  09/30/2017  09/30/2017  07/26/2017  05/28/2017  05/28/2017  05/28/2017  05/15/2017  12/27/2016  12/27/2016  12/27/2016  11/19/2016  07/12/2016  06/19/2016  06/13/2016  06/11/2016  06/11/2016  06/06/2016  06/05/2016  06/05/2016  06/05/2016  06/04/2016  06/04/2016  06/04/2016  06/03/2016  06/03/2016  06/03/2016  06/03/2016  06/03/2016  06/02/2016  06/02/2016  06/02/2016  06/02/2016  06/02/2016  06/02/2016  06/01/2016  06/01/2016  06/01/2016  06/01/2016  06/01/2016  06/01/2016  06/01/2016  06/01/2016  06/01/2016  ??  ??                                                         ??  ??  ??  ??  ??  ??  ??  ??  ??  ??  ??  ??  ??  4.1  ??  ??  ??  ??  ??  ??  ??  ??  ??  ??  ??  ??  ??  ??  ??  ??  ??  ??  ??  ??  ??  ??  ??  ??  ??  ??  ??  ??  ??  ??  ??  ??  ??  ??  ??  ??  Load Older Results                                                         ??  ??  ??  ??  ??  ??  ??  ??  ??  ??  ??  ??  ??  100  ??  ??  ??  ??  ??  ??  ??  ??  ??  ??  ??  ??  ??  ??  ??  ??  ??  ??  ??  ??  ??  ??  ??  ??  ??  ??  ??  ??  ??  ??  ??  ??  ??  ??  ??  ??       ??  ??  ??  ??  ??  ??  ??  ??  ??  ??  ??  ??  ??  4.2 (L)  ??  ??  ??  ??  ??  ??  ??  ??  ??  ??  ??  ??  ??  ??  ??  ??  ??  ??   ??  ??  ??  ??  ??  ??  ??  ??  ??  ??  ??  ??  ??  ??  ??  ??  ??  ??       ??  ??    29    28  ??    25    26  ??  ??    24    24  ??  ??  28.0    26  ??  ??  ??    28    28    28   ??    28    26  ??  ??    26  ??    27  ??  ??    25  ??  ??  ??  ??  ??    24  ??  ??  ??    22  ??  ??  ??  ??  ??  ??    22  ??       ??  ??    276 (H)    358 (H)  ??    330 (H)    268 (H)  ??  ??    242 (H)    205 (H)  ??  ??  256 (H)    172 (H)  ??  ??  ??    208 (H)    228 (H)    252 (H)  ??    142 (H)    145 (H)  ??  ??    150 (H)  ??    149 (H)  ??  ??    231 (H)  ??  ??  ??  ??  ??    131 (H)  ??  ??  ??    212 (H)  ??  ??  ??  ??  ??  ??    135 (H)  ??       ??  ??    13    16  ??    19    17  ??  ??    13    20  ??  ??  17    17  ??  ??  ??    8    13    11   ??    10    13  ??  ??    14  ??    16  ??  ??    15  ??  ??  ??  ??  ??    14  ??  ??  ??  11  ??  ??  ??  ??  ??  ??    9  ??                                                           ??  ??  ??  ??  ??  ??  ??  ??  ??  ??  ??  ??  ??  0.7 (L)  ??  ??  ??  ??  ??  ??  ??  ??  ??  ??  ??  ??  ??  ??  ??  ??  ??  ??  ??  ??  ??  ??  ??  ??  ??  ??  ??  ??  ??  ??  ??  ??  ??  ??  ??  ??                                                           ??  ??  ??  ??  ??  ??  ??  ??  ??  ??  ??  ??  ??  138  ??  ??  ??  ??  ??  ??  ??  ??  ??  ??  ??  ??  ??  ??  ??  ??  ??  ??  ??  ??  ??  ??  ??  ??  ??  ??  ??  ??  ??  ??  ??  ??  ??  ??  ??  ??                                                           ??  ??    4.0    4.3  ??    4.6    4.2  ??  ??    4.1    3.7  ??  ??  ??    3.6  ??  ??  ??    3.7    4.2    4.4  ??    4.2    4.9  4.4  ??    3.7  3.8    3.7  ??  ??    4.2  ??  ??  ??  4.2  ??    3.7  ??  ??  ??    4.2  ??  ??  ??  ??  4.1  ??    4.4  ??  Load Older Results                                                         ??  ??    136    138  ??  138    138  ??  ??    140    142  ??  ??  ??    136  ??  ??  ??    138    136    136  ??    137    132 (L)  ??  ??    137  ??    135  ??  ??    136  ??  ??  ??  ??  ??    139  ??  ??  ??    135  ??  ??  ??  ??  ??  ??    138  ??       ??  ??    99    100  ??    98    97 (L)  ??  ??    100    102  ??  ??  ??    95 (L)  ??  ??  ??    99    97 (L)    98  ??    97 (L)    94 (L)  ??  ??    97 (L)  ??    94  (L)  ??  ??    98  ??  ??  ??  ??  ??    101  ??  ??  ??    96 (L)  ??  ??  ??  ??  ??  ??    98  ??       ??  ??    9.1    9.4  ??    9.3    9.1  ??  ??    8.8    9.0  ??  ??  ??    9.5  ??  ??  ??    9.2    8.9    8.7  ??    8.6    9.1  ??  ??    8.5  ??    8.5  ??  ??    8.1 (L)  ??  ??  ??  ??  ??    7.7 (L)  ??  ??  ??    8.0 (L)  ??  ??  ??  ??  ??  ??    7.9 (L)  ??       ??  ??    0.6 (L)    0.7 (L)  ??    0.7 (L)    0.6 (L)  ??  ??    0.8    0.6 (L)  ??  ??  ??    0.5 (L)  ??  ??  ??    0.6 (L)    0.6 (L)    0.6 (L)  ??    0.6 (L)    0.8  ??  ??    0.6 (L)  ??    0.6 (L)  ??  ??    0.7 (L)  ??  ??  ??  ??  ??    0.5 (L)  ??  ??  ??    0.6 (L)  ??  ??  ??  ??  ??  ??    0.7 (L)  ??       ??  ??    >60.0    >60.0  ??    >60.0    >60.0  ??  ??    >  60.0    >60.0  ??  ??  ??    >60.0  ??  ??  ??    >60.0    >60.0    >60.0  ??    >60.0    >60.0  ??  ??    >60.0  ??    >60.0  ??  ??    >60.0  ??  ??  ??  ??  ??    >60.0  ??  ??  ??    >60.0  ??  ??  ??  ??  ??  ??    >60.0  ??       ??  ??    >60.0    >60.0  ??    >60.0    >60.0  ??  ??    >60.0    >60.0  ??  ??  ??    >60.0  ??  ??  ??    >60.0    >60.0    >60.0  ??    >60.0    >60.0  ??  ??    >60.0  ??    >60.0  ??  ??    >60.0  ??  ??  ??  ??  ??    >60.0  ??  ??  ??    >60.0  ??  ??  ??  ??  ??  ??    >60.0  ??       ??  ??    8.1    9.9  ??    15.0    15.0  ??  ??    16.0    16.0  ??  ??  ??    14.8  ??  ??  ??    11.5    11.1    10.0  ??    11.9    12.0  ??  ??    14.0  ??    14.0  ??  ??    13.0  ??  ??  ??  ??  ??    14.0  ??  ??  ??    17.0  ??  ??  ??  ??  ??  ??    18.0  ??                                                           ??  ??  ??  ??  2.2  ??  ??  ??  ??  ??  ??  ??  ??  ??  ??  ??  ??  ??  ??  ??  ??  ??  ??  ??  ??  2.1  ??  ??  ??  2.2  ??  ??  2.4  ??  ??  ??  2.5  ??  2.4  ??  ??  ??  ??  2.3  ??  2.1  ??  ??  ??  2.1  Load Older Results                                                         ??  ??  ??  ??  ??  4.6    4.2  ??  ??  ??    4.1  ??  ??  ??  ??  4.3  ??  ??  ??  ??  ??  ??  ??  ??  ??  ??  ??  ??  ??  ??  ??  ??  ??  ??  ??  ??  ??  ??  ??  ??  ??  ??  ??  ??  ??  ??  ??  ??  ??  ??       ??  ??  ??  ??  ??    17    15  ??  ??  ??    9  ??  ??  ??  ??  10  ??  ??  ??  ??  ??  ??  ??  ??  ??  ??  ??  ??   ??  ??  ??  ??  ??  ??  ??  ??  ??  ??  ??  ??  ??  ??  ??  ??  ??  ??  ??  ??  ??  ??       ??  ??  ??  ??  ??    27    23  ??  ??  ??    23  ??  ??  ??  ??  17  ??  ??  ??  ??  ??  ??  ??  ??  ??  ??  ??  ??  ??  ??  ??  ??  ??  ??  ??  ??  ??  ??  ??  ??  ??  ??  ??  ??  ??  ??  ??  ??  ??  ??       ??  ??  ??  ??  ??    0.9    0.9  ??  ??  ??    0.7  ??  ??  ??  ??  1.1  ??  ??  ??  ??  ??  ??  ??  ??  ??  ??  ??  ??  ??  ??  ??  ??  ??  ??  ??  ??  ??  ??  ??  ??  ??  ??  ??  ??  ??  ??  ??  ??  ??  ??       ??  ??  ??  ??  ??    144 (H)    135 (H)  ??  ??  ??    129 (H)  ??  ??  ??  ??  154 (H)  ??  ??  ??  ??  ??  ??  ??  ??  ??  ??  ??  ??  ??  ??  ??  ??  ??  ??  ??  ??  ??  ??  ??  ??  ??  ??  ??  ??  ??  ??  ??  ??  ??  ??       ??  ??  ??  ??  ??    2.3    2.6  ??  ??  ??    2.5  ??  ??  ??  ??  3.4  ??  ??  ??  ??  ??  ??  ??  ??  ??  ??  ??  ??  ??  ??  ??  ??  ??  ??  ??  ??  ??  ??  ??  ??  ??  ??  ??  ??  ??  ??  ??  ??  ??  ??       ??  ??  ??  ??  ??  2.0    1.6  ??  ??  ??    1.6  ??  ??  ??  ??  1.3  ??  ??  ??  ??  ??  ??  ??  ??  ??  ??  ??  ??  ??  ??  ??  ??  ??  ??  ??  ??  ??  ??  ??  ??  ??  ??  ??  ??  ??  ??  ??  ??  ??  ??                                                           ??  ??  ??  ??  ??    6.9    6.8  ??  ??  ??    6.6  ??  ??  ??  ??  7.7  ??  ??  ??  ??  ??  ??  ??  ??  ??  ??  ??  ??  ??  ??  ??  ??  ??  ??  ??  ??  ??  ??  ??  ??  ??  ??  ??  ??  ??  ??  ??  ??  ??  ??  Load Older Results                                                         ??  10.6 (H)  ??  ??  ??  ??  ??  9.3 (H)  ??  ??  ??  8.6 (H)  ??  ??  ??  ??  ??  9.6 (H)  ??  ??  ??  7.5 (H)  ??  ??  ??  ??  ??  ??  ??  ??  ??  ??  ??  ??  ??  ??  ??  ??  ??  ??  ??  ??  ??  ??  ??  ??  ??  ??  ??  ??       ??  259 (H)  ??  ??  ??  ??  ??  221 (H)  ??  ??  ??  200 (H)  ??  ??  ??  ??  ??  230 (H)  ??  ??  ??  169 (H)  ??  ??  ??  ??  ??  ??  ??  ??  ??  ??  ??  ??  ??  ??  ??  ??  ??  ??  ??  ??  ??  ??  ??  ??  ??  ??  ??  ??         128  ??  ??  ??  ??  ??  ??  ??    188  ??  ??  ??    176  ??  ??  ??  ??  ??  ??  ??  ??  ??  ??  ??  ??  ??  ??  ??  ??  ??  ??  ??  ??  ??  ??  ??  ??  ??  ??  ??  ??  ??  ??  ??  ??  ??  ??  ??  ??  ??  82  ??  ??  ??  ??  ??  ??  ??    95  ??  ??  ??    115  ??  ??  ??  ??  ??  ??  ??  ??  ??  ??  ??  ??  ??  ??  ??  ??  ??  ??  ??  ??  ??  ??  ??  ??  ??  ??  ??  ??  ??  ??  ??   ??  ??  ??    93  ??  ??                                                             53  ??  ??  ??  ??  ??  ??  ??    53  ??  ??  ??    55  ??  ??  ??  ??  ??  ??  ??  ??  ??  ??  ??  ??  ??  ??  ??  ??  ??  ??  ??  ??  ??  ??  ??  ??  ??  ??  ??  ??  ??  ??  ??  ??  ??  ??  ??  ??  ??         2.4  ??  ??  ??  ??  ??  ??  ??    3.5  ??  ??  ??    3.2  ??  ??  ??  ??  ??  ??  ??  ??  ??  ??  ??  ??  ??  ??  ??  ??  ??  ??  ??  ??  ??  ??  ??  ??  ??  ??  ??  ??  ??  ??  ??  ??  ??  ??  ??  ??  ??         58  ??  ??  ??  ??  ??  ??  ??    116 (H)  ??  ??  ??    98  ??  ??  ??  ??  ??  ??  ??  ??  ??  ??  ??  ??  ??  ??  ??  ??  ??  ??  ??  ??  ??  ??  ??  ??  ??  ??  ??  ??  ??  ??  ??  ??  ??  ??  ??  ??  ??                                                             16  ??  ??  ??  ??  ??  ??  ??    19  ??  ??  ??    23  ??  ??  ??  ??  ??  ??  ??  ??  ??  ??  ??  ??  ??  ??  ??  ??  ??  ??  ??  ??  ??  ??  ??  ??  ??  ??  ??  ??  ??  ??  ??  ??  ??  ??  ??  ??  ??  Load Older Results                                                         ??  ??  ??  ??  ??  ??  ??  ??  ??  ??  ??  ??  ??  ??  ??  0.3  ??  ??  ??  ??  ??  ??  ??  ??  ??  ??  ??  ??  ??  ??  ??  ??  ??  ??  ??  ??  ??  ??  ??  ??  ??  ??  ??  ??  ??  ??  ??  ??  ??  ??       ??  ??  ??  ??  ??  ??  ??  ??  ??  ??  ??  ??  ??  ??  ??  ??  <20  ??  ??  ??  ??  ??  ??  ??  ??  ??  ??  ??  ??  ??  ??  ??  ??  ??  ??  ??  ??  ??  ??  ??  ??  ??  ??  ??  ??  ??  ??  ??  ??  ??       ??  ??  ??  ??  ??  ??  ??  ??  ??  ??  ??  ??  ??  ??  ??  ??  ??  ??  ??  ??  ??  ??  ??  ??  ??  ??  ??  ??  ??  ??  4.6  ??  ??  ??  4.3 (L)  ??  ??  ??  ??  ??  4.2 (L)  ??  4.3 (L)  ??  4.2 (L)  ??  ??  ??  ??  ??       ??  ??  ??  ??  ??  ??  ??  ??  ??  ??  ??  ??  ??  ??  ??  ??  ??  ??  ??  ??  ??  ??  ??  ??  ??  ??  ??  ??  ??  ??  ??  ??  ??  2.8  ??  ??  ??  ??  ??  2.5  ??  ??  ??  ??  ??  ??  ??  ??  ??  ??       ??  ??  ??  ??  ??  ??  ??  ??  ??  ??  ??  ??  ??  ??  ??  ??  ??  ??  ??  ??  ??  ??  ??  ??  ??  ??  ??  ??  ??  ??  ??  ??  ??  ??  ??  ??  ??  ??  ??  ??  ??  ??  ??  ??  ??  ??  ??  ??  ??  ??       ??  ??  ??  ??  ??  ??  ??  ??  ??  ??  ??  ??  ??  ??  ??  ??  ??  ??  ??  ??  ??  ??  ??  ??  ??  ??  ??  ??  ??  ??  ??  ??  ??  ??  ??  ??  ??  ??  ??  ??  ??  ??  ??  ??  ??  ??  ??  ??  ??  ??                                                           ??  ??  ??  ??  ??  ??  ??  ??  ??  ??  ??  ??  ??  ??  ??  ??  ??  ??  ??  ??  ??  ??  ??   ??  ??  ??  ??  ??  ??  ??  ??  ??  ??  ??  ??  ??  ??  ??  ??  ??  ??  ??  ??  ??  ??  ??  ??  ??  ??  ??                                                           ??  ??  ??  ??  ??  ??  Written by Renaee Munda, [employer], as dictated by Dr. Hinda Kehr. I, Dr. Hinda Kehr, confirm that all documentation is accurate.

## 2018-06-24 ENCOUNTER — Encounter

## 2018-06-27 ENCOUNTER — Inpatient Hospital Stay: Admit: 2018-06-27 | Payer: PRIVATE HEALTH INSURANCE | Attending: Foot and Ankle Surgery | Primary: Registered Nurse

## 2018-06-27 DIAGNOSIS — M810 Age-related osteoporosis without current pathological fracture: Secondary | ICD-10-CM

## 2018-07-01 NOTE — Telephone Encounter (Signed)
Patient states he has been having cramps in both legs. He states that is happens day and night. He wants to know if this is something Dr. Burt Knackaines can treat or should he see his family doctor?    Please advise [phone]

## 2018-07-01 NOTE — Telephone Encounter (Signed)
Spoke with patient and notified him per PA Orie, she recommends going to the ED to be evaluated for a possible blood clot. Patient was advised that we do not have the necessary equipment to do so in our office. Patient states he was evaluated a few months ago and he did not have a blood clot. I again notified the patient our recommendation is that he proceeds to the ED for evaluation, just because he did not have a blood clot before does not mean he does not have one now. Patient verbalized understanding. He is aware he has a f/u appointment with us next Tuesday 07/08/18.

## 2018-07-04 ENCOUNTER — Encounter

## 2018-07-04 MED ORDER — DICLOFENAC 75 MG TAB, DELAYED RELEASE
75 mg | ORAL_TABLET | ORAL | 0 refills | Status: DC
Start: 2018-07-04 — End: 2019-02-04

## 2018-07-08 ENCOUNTER — Ambulatory Visit: Attending: Foot and Ankle Surgery | Primary: Registered Nurse

## 2018-07-08 ENCOUNTER — Ambulatory Visit
Admit: 2018-07-08 | Discharge: 2018-07-08 | Payer: PRIVATE HEALTH INSURANCE | Attending: Foot and Ankle Surgery | Primary: Registered Nurse

## 2018-07-08 DIAGNOSIS — S82875D Nondisplaced pilon fracture of left tibia, subsequent encounter for closed fracture with routine healing: Secondary | ICD-10-CM

## 2018-07-08 MED ORDER — METAXALONE 800 MG TAB
800 mg | ORAL_TABLET | Freq: Two times a day (BID) | ORAL | 2 refills | Status: DC | PRN
Start: 2018-07-08 — End: 2018-09-02

## 2018-07-08 MED ORDER — CALCIUM CARBONATE 500 MG (1250 MG) TAB
500 mg calcium (1,250 mg) | ORAL_TABLET | Freq: Every day | ORAL | 2 refills | Status: DC
Start: 2018-07-08 — End: 2018-08-18

## 2018-07-08 MED ORDER — CHOLECALCIFEROL (VITAMIN D3) 2,000 UNIT CAPSULE
ORAL_CAPSULE | Freq: Every day | ORAL | 2 refills | Status: DC
Start: 2018-07-08 — End: 2018-08-18

## 2018-07-08 NOTE — Progress Notes (Signed)
AMBULATORY PROGRESS NOTE      Patient: Kyle Mueller             MRN: [id number]     SSN: [id number] Body mass index is 27.12 kg/m??.  Date of Birth: May 12, 1956     AGE: 62 y.o.       SEX: male    PCP: Cordelia Poche, NP     IMPRESSION/DIAGNOSIS AND TREATMENT PLAN     DIAGNOSES  1. Closed nondisplaced pilon fracture of left tibia with routine healing, subsequent encounter    2. Posterior tibial tendinitis of left lower extremity        Orders Placed This Encounter   ??? [73610] Ankle Min 3V   ??? [73590] Tib/Fib 2V      Kyle Mueller understands his diagnoses and the proposed plan.   For his reported muscle spasms, I will get him into physical therapy.  We will try Skelaxin 800 mg one po bid. I did review some blood work from [facility] from July 01, 2018, showed his Potassium was normal.  Chloride was slightly low, and sodium was slightly low.  He had a D-dimer study as well, as he went to the ER a few days ago to rule out DVT, and these studies were reportedly negative.  His Vitamin D level was 28.2 on the low side.     A PVL done on March 08, 2018, was negative for DVT.     CT scan on March 08, 2018, showed no evidence of PE.     His x-rays of the left ankle, today, show a healed, left, medial malleolar fracture, non-displaced, non-angulated.  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  The tibia and fibula films show no fracture to the tibia.  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  There is hardware to the calcaneus where he had his previous calcaneus surgery to address calcaneal bone spurs.      Plan:    1) Vitamin D3 2,000 units: 1 PO every day; 30 tablets, 2 refills.  2) Calcium 500 mg: 1 PO every day; 30 tablets, 2 refills.   3) Skelaxin 800 mg: 1 PO BIDPRN; 60 tablets, 2 refills.  4) Referral to Physical Therapy.   5) Continue using the CAM boot as directed.   6) Continue activity modification as directed.      RTO - 3 weeks     HPI AND EXAMINATION      Kyle Mueller IS A 62 y.o. male who presents to my outpatient office for follow up of a closed nondisplaced pilon fracture of the left tibia. At the last visit, I ordered lab work (CMP, Vitamin D levels), ordered a bone scan, instructed the patient to continue activity modification as directed, and to continue wearing the CAM boot.     Since we saw him last, Kyle Mueller states that he is still experiencing pain in his left ankle. He presents to the office today in his left CAM boot and is accompanied by his wife. Bone density results have been reviewed with the patient. Of note, the patient adds that he has been experience muscle cramps. He denies taking diuretics. He states that he last had muscle cramps last night, and he finds that he needs to stand to get the cramp out. He notes that he has had surgery on L4 and L5 vertebrae. He reports that he experiences muscle cramps in both of his legs.  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 He is not currently taking blood thinners, other than ASA.     Visit Vitals  BP 142/86   Pulse 73   Ht 6' (1.829 m)   Wt 200 lb (90.7 kg)   SpO2 95%   BMI 27.12 kg/m??     Appearance: Alert, well appearing and pleasant patient who is in no distress, oriented to person, place/time, and who follows commands.This patient is accompanied in the examination room by his wife. Dementia: no dementia  Psychiatric: Affect and mood are appropriate. Patient arrives to office via: with assistive device: CAM boot  HEENT: Head normocephalic & atraumatic. Both pupils are round, non icteric sclera              Eye: EOM are intact and sclera are clear               Neck: ROM WNL and JVD neck is not present              Hearings Intact, does not require hearing aid device  Respiratory: Breathing is unlabored without accessory chest muscle use  Cardiovascular/Peripheral Vascular: Normal Pulses to each foot  ??  ANKLE/FOOT left  ??  Gait: in CAM boot   Tenderness: No tenderness to the posterior tibial tendon behind the medial malleolus.   Cutaneous: Minimal swelling over the medial ankle.   Joint Motion: Can DF to neutral with knee flexed and extended                          30 degrees of PF                          Limited ST motion                          Limited inversion.   Joint / Tendon Stability: No Ankle or Subtalar instability or joint laxity.                                             No peroneal sublux ability or dislocation  Alignment: Forefoot, Midfoot, Hindfoot WNL.   Neuro Motor/Sensory: NL/NL.  Vascular: NL foot/ankle pulses.  Lymphatics: No extremity lymphedema, No calf swelling, no tenderness to calf muscles.    CHART REVIEW     Past Medical History:   Diagnosis Date   ??? Asthma    ??? Diabetes (HCC)     CONTROLLED W/FOOD AND DIET   ??? 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 Erectile dysfunction    ??? Hypertension      Current Outpatient Medications   Medication Sig   ??? diclofenac EC (VOLTAREN) 75 mg EC tablet TAKE 1 TABLET BY MOUTH 2 TIMES A DAY WITH MEALS   ??? aspirin (ASPIRIN) 325 mg tablet Take 325 mg by mouth.   ??? benzonatate (TESSALON) 200 mg capsule Take 200 mg by mouth.   ??? glucose blood VI test strips (ONETOUCH ULTRA BLUE TEST STRIP) strip TEST BLOOD GLUCOSE UP TO  QID   ??? cyclobenzaprine (FLEXERIL) 10 mg tablet Take 5-10 mg by mouth.   ??? insulin glargine-lixisenatide (SOLIQUA 100/33) 100 unit-33 mcg/mL inpn 22 Units by SubCUTAneous route.   ??? metoprolol succinate (  TOPROL-XL) 25 mg XL tablet Take 25 mg by mouth.   ??? omeprazole (PRILOSEC) 40 mg capsule Take 40 mg by mouth.   ??? rosuvastatin (CRESTOR) 20 mg tablet Take 20 mg by mouth.   ??? tadalafil (CIALIS) 5 mg tablet Take 5 mg by mouth daily as needed.   ??? vardenafil (LEVITRA) 20 mg tablet Take 20 mg by mouth daily as needed for 10 doses.     No current facility-administered medications for this visit.      Allergies   Allergen Reactions    ??? Augmentin [Amoxicillin-Pot Clavulanate] Anaphylaxis, Shortness of Breath and Palpitations     BP DROPS   ??? Codeine Other (comments)     COMBATIVE   ??? Dilaudid [Hydromorphone (Bulk)] Other (comments)     HALLUCINATIONS   ??? Percocet [Oxycodone-Acetaminophen] Nausea and Vomiting     Past Surgical History:   Procedure Laterality Date   ??? CARDIAC SURG PROCEDURE UNLIST  05/25/2016   ??? HX BACK SURGERY  1991    L-4 L-5 DISCECTOMY   ??? HX MOHS PROCEDURES  2009    OBICI   ??? HX ORTHOPAEDIC      BILAT ACHILLES TENDON REPAIR     Social History     Occupational History   ??? Not on file   Tobacco Use   ??? Smoking status: Former Smoker     Packs/day: 1.00     Years: 25.00     Pack years: 25.00     Last attempt to quit: 12/24/1996     Years since quitting: 21.5   ??? Smokeless tobacco: Never Used   Substance and Sexual Activity   ??? Alcohol use: No     Frequency: Never     Comment: ocassional ( did not answer)    ??? Drug use: No   ??? Sexual activity: Yes     Partners: Female     Comment: E.D.     Family History   Problem Relation Age of Onset   ??? Inflammatory Bowel Dz Mother    ??? Cancer Father         LUNG CA   ??? Kidney Disease Sister         RENAL FAILURE   ??? Diabetes Sister         REVIEW OF SYSTEMS : 07/08/2018  ALL BELOW ARE Negative except : SEE HPI     Constitutional: Negative for fever, chills and weight loss. Neg Weight Loss  Cardiovascular: Negative for chest pain, claudication and leg swelling. SOB, DOE   Gastrointestinal/Urological: Negative for  pain, N/V/D/C, Blood in stool or urine,dysuria                         Hematuria, Incontinence, pelvic pain  Musculoskeletal: see HPI.  Neurological: Negative for dizziness and weakness, headaches,Visual Changes             Confusion,  Or Seizures,   Psychiatric/Behavioral: Negative for depression, memory loss and substance abuse.   Extremities:  Negative for hair changes, rash or skin lesion changes.  Hematologic: Negative for Bleeding problems, bruising, pallor or swollen lymph nodes.   Peripheral Vascular: No calf pain, vascular vein tenderness to calf pain              No calf throbbing, posterior knee throbbing pain     DIAGNOSTIC IMAGING     No notes on file    DEXA Results (most recent):  Results from [facility] Encounter  encounter on 06/27/18   DEXA BONE DENSITY STUDY AXIAL    Narrative DEXA BONE DENSITOMETRY, CENTRAL    CLINICAL INDICATION/HISTORY: Screening. 62 year old male for baseline study.  Recent nontraumatic tibial fracture. Hypertension. High cholesterol.    TECHNIQUE: Using GE LUNAR Prodigy densitometer, bone density measurement was  performed in the lumbar spine, the proximal left and right femora. T Score  refers to standard deviations above or below average compared to a young adult  of the same sex.  Z Score refers to standard deviations above or below average  compared to a patient of the same sex, age, race and weight.     COMPARISON: None available.     FINDINGS:     Lumbar Spine Levels: L1-L4  Mean Bone Mineral Density (BMD):  1.165 g/cm2    T Score: -0.6  Z Score: -0.6    On PA image of the lumbar spine obtained for localization of vertebral levels  (not a diagnostic quality radiograph), there is apparent increased density at  multiple levels.  The density is not well characterized on the basis of this  image, but likely degenerative.  These changes render the lumbar spine of  questionable diagnostic validity as a site for densitometry in this patient.     Left Total Proximal Femur BMD: 0.836 g/cm2    T Score: -1.8   Z Score: -1.6     Right Total Proximal Femur BMD: 0.870 g/cm2   T Score: -1.6    Z Score: -1.4     Left Femoral Neck BMD: 0.945  g/cm2    T Score: -1.0  Z Score: -0.3    Right Femoral Neck BMD: 0.953  g/cm2    T Score: -0.9  Z Score: -0.2       Impression IMPRESSION:    BMD measures consistent with osteopenia/low bone density.     Based upon current ISCD guidelines, the patient's overall diagnostic category,   selected using WHO criteria in postmenopausal women and males aged 69 and above,  is selected based upon the lowest T Score from among the lumbar spine, total  femur, femoral neck, (or distal third radius if measured).    WHO Definition of Osteoporosis and Osteopenia on DXA (specified for post  menopausal Caucasian females):      Normal:                     T Score at or above -1 SD    Osteopenia:              T Score between -1 and -2.5 SD    Osteoporosis:          T Score at or below -2.5 SD    The risk of fracture approximately doubles for each 1 SD decrease in T Score.   It is important to consider other factors in assessing a patient's risk of  fracture, including age, risk of falling/injury, history of fragility fracture,  family history of osteoporosis, smoking, low weight.      Various fracture risk tools have been developed for adult patients and are  available online. For example, the FRAX tool developed by [facility] is widely used.  Reference YellowSpecialist.at.    It is also important to note that DXA measures bone density but does not  distinguish among causes of decreased bone density, which include primary versus  secondary osteoporosis (such as metabolic bone disorders or possible effects of  medications) and also other conditions (such as  osteomalacia).  Clinical  considerations should determine what additional evaluation may be warranted to  exclude secondary conditions in a patient with low bone density.    Please note that reliable, valid comparisons can not be made between studies  which have been performed on different densitometers.  If clinically warranted,  follow up study performed at this site would best permit assessment of trend for  possible change in bone mineral density over time in comparison to this study.    Thank you for this referral.       Component Name  07/01/2018 ??   28.2 (L)   Vitamin D, 25 Hydroxy           MRI Results (most recent):   Results from Hospital Encounter encounter on 06/05/18   MRI ANKLE LT WO CONT    Narrative EXAMINATION: MRI left ankle without contrast    INDICATION: Left ankle pain, posterior tibialis injury    COMPARISON: None    TECHNIQUE: Multiplanar multiecho MRI sequences of the left ankle performed  without contrast.    FINDINGS:    Bones/joints:  -There is a nondisplaced incomplete fracture at the base of the medial malleolus  extending to the anterior cortex, with extensive surrounding marrow edema.  -Large Achilles enthesophyte with suspected surgical anchors at the region of  the Achilles insertion. Large plantar calcaneal spur.  -Talonavicular degenerative changes with a suspected os supranaviculare.  Prominent anterior processes of the calcaneus with anterior subtalar joint facet  degenerative change.  -Large os peroneum.    Ligaments:  -Calcaneofibular ligament appears intact. Anterior and posterior talofibular  ligaments appear intact. Anterior and posterior tibiofibular ligaments appear  intact.  -Deltoid ligament with intrasubstance signal in the deep fibers, otherwise felt  to be largely intact. Spring ligament appears intact. Lisfranc ligament appears  largely intact.    Tendons:  -Mild tendinopathy of the distal Achilles tendon characterized by thickening,  involving roughly 3 cm length of the tendon, with suspected postsurgical  susceptibility artifact at the insertion.  -The peroneal tendons are intact.  -The flexor tendons at the ankle are intact.  -Extensor tendons at the ankle are intact.    Soft tissues: Sinus tarsi fat signal preserved. Plantar fascia intact. Extensive  fatty atrophy of the abductor digiti minimi. Trace subtalar joint effusion.  Superficial edema about the ankle.      Impression IMPRESSION:    Incomplete nondisplaced fracture of the distal tibia at the base of the medial  malleolus described above.    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 Large Achilles   enthesophyte.  -Plantar calcaneal spur.  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    Additional chronic bony changes as above. No other significant findings.    Regarding distal tibia fracture, patient was told to report to ordering  clinician's office which is nearby. Technologist attempted to call ordering  physician's office but without answer at the time of this dictation.           Please see above section of this report.    I have personally reviewed the results of the above study. The interpretation of this study is my professional opinion.    Written by Hoyt KochJennifer Hesse, ScribeKick, as dictated by Dr. Silvano BilisMichael Railee Bonillas. I, Dr. Silvano BilisMichael Rohen Kimes, confirm that all documentation is accurate.

## 2018-07-08 NOTE — Progress Notes (Signed)
PATIENT PRESENTS FOR LEFT FOOT F/U

## 2018-07-08 NOTE — Patient Instructions (Signed)
Metatarsal Fracture: Care Instructions  Your Care Instructions    A metatarsal fracture is a break or a thin, hairline crack in one of the metatarsal bones of the foot. This type of fracture usually happens from repeated stress on the bones of the foot. Or it can happen when a person jumps or changes direction quickly and twists his or her foot or ankle the wrong way. This fracture is common among dancers because their work involves a lot of jumping, and balancing and turning on one foot.  Treatment depends on how bad the fracture is and where the fracture is on the bone. You may or may not have had surgery. Your doctor may have put your foot in a cast or splint to keep it stable. You may have been given crutches to use to keep weight off your foot.  A metatarsal fracture may take from 6 weeks to several months to heal. It is important to give your foot time to heal completely, so that you do not hurt it again. Do not return to your usual activities until your doctor says you can. Your doctor may suggest that you get physical therapy to help regain strength and range of motion in your foot.  You heal best when you take good care of yourself. Eat a variety of healthy foods, and don't smoke.  Follow-up care is a key part of your treatment and safety. Be sure to make and go to all appointments, and call your doctor if you are having problems. It is also a good idea to know your test results and keep a list of the medicines you take.  How can you care for yourself at home?  ?? Be safe with medicines. Read and follow all instructions on the label.  ? If your doctor gave you a prescription medicine for pain, take it as prescribed.  ? If you are not taking a prescription pain medicine, ask your doctor if you can take an over-the-counter medicine.  ?? Follow your doctor's instructions about how much weight you can put on your foot and when you can go back to your usual activities. If you were  given crutches, use them as directed.  ?? Put ice or a cold pack on your foot for 10 to 20 minutes at a time. Try to do this every 1 to 2 hours for the next 3 days (when you are awake) or until the swelling goes down. Put a thin cloth between the ice and your skin.  ?? Prop up your foot on a pillow when you ice it or anytime you sit or lie down for the next 3 days. Try to keep it above the level of your heart. This will help reduce swelling.  Cast and splint care  ?? If your foot is in a cast or splint, follow the cast or splint care instructions your doctor gives you. If you have a removable fiberglass walking cast or a splint, do not take it off unless your doctor tells you to.  ?? Keep your cast or splint dry. If you have a removable fiberglass walking cast or a splint, ask your doctor if it is okay to remove it to bathe. Your doctor may want you to keep it on as much as possible.  ?? If you're told to keep your cast or splint on, tape a sheet of plastic to cover it when you bathe. Or ask your doctor about products that can help keep a cast or splint   dry. Water under the cast or splint can cause your skin to itch and hurt.  ?? Never cut your cast or stick anything down it to scratch an itch on your leg.  When should you call for help?  Call your doctor now or seek immediate medical care if:  ?? ?? You have problems with your cast or splint. For example:  ? The skin under the cast or splint is burning or stinging.  ? The cast or splint feels too tight.  ? There is a lot of swelling near the cast or splint. (Some swelling is normal.)  ? You have a new fever.  ? There is drainage or a bad smell coming from the cast or splint.   ?? ?? You have increased or severe pain.   ?? ?? You have tingling, weakness, or numbness in your foot and toes.   ?? ?? You cannot move your toes.   ?? ?? Your foot turns cold or changes color.   ??Watch closely for changes in your health, and be sure to contact your doctor if:   ?? ?? The pain does not get better day by day.   ?? ?? You do not get better as expected.   Where can you learn more?  Go to http://www.healthwise.net/[employer].  Enter B321 in the search box to learn more about "Metatarsal Fracture: Care Instructions."  Current as of: September 12, 2017  Content Version: 11.9  ?? 2006-2018 [employer]. Care instructions adapted under license by [employer] (which disclaims liability or warranty for this information). If you have questions about a medical condition or this instruction, always ask your healthcare professional. [employer] disclaims any warranty or liability for your use of this information.

## 2018-07-08 NOTE — Progress Notes (Addendum)
Progress Notes by Netta Corrigan, MD at 07/08/18 0820                Author: Netta Corrigan, MD  Service: --  Author Type: Physician       Filed: 07/10/18 1157  Encounter Date: 07/08/2018  Status: Signed          Editor: Netta Corrigan, MD (Physician)          Related Notes: Original Note by Netta Corrigan, MD (Physician) filed at 07/08/18 0907               AMBULATORY PROGRESS NOTE         Patient: Kyle Mueller              MRN: [id number]     SSN:  [id number] Body mass index is 27.12 kg/m??.   Date of Birth: 1956/05/21     AGE:  62 y.o.       SEX: male      PCP: Cordelia Poche, NP         IMPRESSION/DIAGNOSIS AND TREATMENT PLAN        DIAGNOSES      1.  Closed nondisplaced pilon fracture of left tibia with routine healing, subsequent encounter         2.  Posterior tibial tendinitis of left lower extremity              Orders Placed This Encounter        ?  [73610] Ankle Min 3V        ?  [91478] Tib/Fib 2V         Kyle Mueller understands his  diagnoses and the proposed plan.    For his reported muscle spasms, I will get him into physical therapy.  We will try Skelaxin 800 mg one po bid. I did review some blood work from [facility] from July 01, 2018, showed his Potassium was normal.  Chloride was slightly low, and sodium was slightly  low.  He had a D-dimer study as well, as he went to the ER a few days ago to rule out DVT, and these studies were reportedly negative.  His Vitamin D level was 28.2 on the low side.       A PVL done on March 08, 2018, was negative for DVT.       CT scan on March 08, 2018, showed no evidence of PE.       His x-rays of the left ankle, today, show a healed, left, medial malleolar fracture, non-displaced, non-angulated.  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  The tibia and fibula films show no fracture to the tibia.  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  There is hardware to the calcaneus where he had his  previous calcaneus surgery to address calcaneal bone spurs.        Plan:      1) Vitamin D3 2,000 units: 1 PO every day; 30 tablets, 2 refills.   2) Calcium 500 mg: 1 PO every day; 30 tablets, 2 refills.    3) Skelaxin 800 mg: 1 PO BIDPRN; 60 tablets, 2 refills.   4) Referral to Physical Therapy.    5) Continue using the CAM boot as directed.    6) Continue activity modification as directed.        RTO - 3 weeks         HPI  AND EXAMINATION        Kyle Mueller IS A 62 y.o.  male who presents to my outpatient office for follow up of a closed nondisplaced pilon fracture of the left tibia. At the last visit, I ordered lab work (CMP, Vitamin D levels), ordered a bone scan,  instructed the patient to continue activity modification as directed, and to continue wearing the CAM boot.       Since we saw him last, Kyle Mueller states that he is still experiencing pain in his left ankle. He presents to the office today in his left CAM boot and is accompanied by his wife. Bone density results have been reviewed with the patient. Of note, the  patient adds that he has been experience muscle cramps. He denies taking diuretics. He states that he last had muscle cramps last night, and he finds that he needs to stand to get the cramp out. He notes that he has had surgery on L4 and L5 vertebrae.  He reports that he experiences muscle cramps in both of his legs.        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 He is not currently taking blood thinners, other than ASA.       Visit Vitals      BP  142/86     Pulse  73     Ht  6' (1.829 m)     Wt  200 lb (90.7 kg)     SpO2  95%        BMI  27.12 kg/m??        Appearance: Alert, well appearing and pleasant patient who is in no distress, oriented to person, place/time, and  who follows commands.This patient is accompanied in the examination room by his wife. Dementia: no dementia   Psychiatric: Affect and mood are appropriate. Patient arrives to office via: with assistive  device: CAM boot   HEENT: Head normocephalic & atraumatic. Both pupils are round, non icteric sclera               Eye: EOM are intact and sclera are clear                Neck: ROM WNL and JVD neck is not present               Hearings Intact, does not require hearing aid device   Respiratory: Breathing is unlabored without accessory chest muscle use   Cardiovascular/Peripheral Vascular: Normal Pulses to each foot   ??   ANKLE/FOOT left   ??   Gait: in CAM boot   Tenderness: No tenderness to the posterior tibial tendon behind the medial malleolus.    Cutaneous: Minimal swelling over the medial ankle.    Joint Motion: Can DF to neutral with knee flexed and extended                           30 degrees of PF                           Limited ST motion                           Limited inversion.    Joint / Tendon Stability: No Ankle or Subtalar instability or joint laxity.  No peroneal sublux ability or dislocation   Alignment: Forefoot, Midfoot, Hindfoot WNL.    Neuro Motor/Sensory: NL/NL.   Vascular: NL foot/ankle pulses.   Lymphatics: No extremity lymphedema, No calf swelling, no tenderness to calf muscles.        CHART REVIEW          Past Medical History:        Diagnosis  Date         ?  Asthma       ?  Diabetes (HCC)            CONTROLLED W/FOOD AND DIET         ?  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  Erectile dysfunction           ?  Hypertension            Current Outpatient Medications        Medication  Sig         ?  diclofenac EC (VOLTAREN) 75 mg EC tablet  TAKE 1 TABLET BY MOUTH 2 TIMES A DAY WITH MEALS     ?  aspirin (ASPIRIN) 325 mg tablet  Take 325 mg by mouth.     ?  benzonatate (TESSALON) 200 mg capsule  Take 200 mg by mouth.     ?  glucose blood VI test strips (ONETOUCH ULTRA BLUE TEST STRIP) strip  TEST BLOOD GLUCOSE UP TO  QID     ?  cyclobenzaprine (FLEXERIL) 10 mg tablet  Take 5-10 mg by mouth.     ?  insulin glargine-lixisenatide  (SOLIQUA 100/33) 100 unit-33 mcg/mL inpn  22 Units by SubCUTAneous route.     ?  metoprolol succinate (TOPROL-XL) 25 mg XL tablet  Take 25 mg by mouth.     ?  omeprazole (PRILOSEC) 40 mg capsule  Take 40 mg by mouth.     ?  rosuvastatin (CRESTOR) 20 mg tablet  Take 20 mg by mouth.     ?  tadalafil (CIALIS) 5 mg tablet  Take 5 mg by mouth daily as needed.         ?  vardenafil (LEVITRA) 20 mg tablet  Take 20 mg by mouth daily as needed for 10 doses.          No current facility-administered medications for this visit.           Allergies        Allergen  Reactions         ?  Augmentin [Amoxicillin-Pot Clavulanate]  Anaphylaxis, Shortness of Breath and Palpitations             BP DROPS         ?  Codeine  Other (comments)             COMBATIVE         ?  Dilaudid [Hydromorphone (Bulk)]  Other (comments)             HALLUCINATIONS         ?  Percocet [Oxycodone-Acetaminophen]  Nausea and Vomiting          Past Surgical History:         Procedure  Laterality  Date          ?  CARDIAC SURG PROCEDURE UNLIST    05/25/2016     ?  HX BACK SURGERY    1991  L-4 L-5 DISCECTOMY          ?  HX MOHS PROCEDURES    2009          OBICI          ?  HX ORTHOPAEDIC              BILAT ACHILLES TENDON REPAIR          Social History          Occupational History        ?  Not on file       Tobacco Use         ?  Smoking status:  Former Smoker              Packs/day:  1.00         Years:  25.00         Pack years:  25.00         Last attempt to quit:  12/24/1996         Years since quitting:  21.5         ?  Smokeless tobacco:  Never Used       Substance and Sexual Activity         ?  Alcohol use:  No              Frequency:  Never             Comment: ocassional ( did not answer)          ?  Drug use:  No     ?  Sexual activity:  Yes              Partners:  Female             Comment: E.D.          Family History         Problem  Relation  Age of Onset          ?  Inflammatory Bowel Dz  Mother       ?  Cancer  Father                 LUNG CA          ?  Kidney Disease  Sister                RENAL FAILURE          ?  Diabetes  Sister                REVIEW OF SYSTEMS : 07/08/2018   ALL BELOW ARE Negative except : SEE HPI        Constitutional: Negative for fever, chills and weight loss. Neg Weight Loss   Cardiovascular: Negative for chest pain, claudication and leg swelling. SOB, DOE    Gastrointestinal/Urological: Negative for  pain, N/V/D/C, Blood in stool or urine,dysuria                         Hematuria, Incontinence, pelvic pain   Musculoskeletal: see HPI.   Neurological: Negative for dizziness and weakness, headaches,Visual Changes             Confusion,  Or Seizures,    Psychiatric/Behavioral: Negative for depression, memory loss and substance abuse.    Extremities:  Negative for hair changes, rash or skin lesion changes.   Hematologic: Negative for Bleeding problems, bruising, pallor or swollen  lymph nodes.   Peripheral Vascular: No calf pain, vascular vein tenderness to calf pain               No calf throbbing, posterior knee throbbing pain         DIAGNOSTIC IMAGING        No notes on file      DEXA Results (most recent):     Results from Hospital Encounter encounter on 06/27/18     DEXA BONE DENSITY STUDY AXIAL           Narrative  DEXA BONE DENSITOMETRY, CENTRAL      CLINICAL INDICATION/HISTORY: Screening. 62 year old male for baseline study.   Recent nontraumatic tibial fracture. Hypertension. High cholesterol.      TECHNIQUE: Using GE LUNAR Prodigy densitometer, bone density measurement was   performed in the lumbar spine, the proximal left and right femora. T Score   refers to standard deviations above or below average compared to a young adult   of the same sex.  Z Score refers to standard deviations above or below average   compared to a patient of the same sex, age, race and weight.       COMPARISON: None available.       FINDINGS:       Lumbar Spine Levels: L1-L4   Mean Bone Mineral Density (BMD):  1.165 g/cm2     T Score:  -0.6   Z Score: -0.6      On PA image of the lumbar spine obtained for localization of vertebral levels   (not a diagnostic quality radiograph), there is apparent increased density at   multiple levels.  The density is not well characterized on the basis of this   image, but likely degenerative.  These changes render the lumbar spine of   questionable diagnostic validity as a site for densitometry in this patient.       Left Total Proximal Femur BMD: 0.836 g/cm2     T Score: -1.8    Z Score: -1.6       Right Total Proximal Femur BMD: 0.870 g/cm2    T Score: -1.6     Z Score: -1.4       Left Femoral Neck BMD: 0.945  g/cm2     T Score: -1.0   Z Score: -0.3      Right Femoral Neck BMD: 0.953  g/cm2     T Score: -0.9   Z Score: -0.2               Impression  IMPRESSION:      BMD measures consistent with osteopenia/low bone density.       Based upon current ISCD guidelines, the patient's overall diagnostic category,   selected using WHO criteria in postmenopausal women and males aged 62 and above,   is selected based upon the lowest T Score from among the lumbar spine, total   femur, femoral neck, (or distal third radius if measured).      WHO Definition of Osteoporosis and Osteopenia on DXA (specified for post   menopausal Caucasian females):        Normal:                     T Score at or above -1 SD     Osteopenia:              T Score between -1 and -2.5 SD     Osteoporosis:  T Score at or below -2.5 SD      The risk of fracture approximately doubles for each 1 SD decrease in T Score.    It is important to consider other factors in assessing a patient's risk of   fracture, including age, risk of falling/injury, history of fragility fracture,   family history of osteoporosis, smoking, low weight.        Various fracture risk tools have been developed for adult patients and are   available online. For example, the FRAX tool developed by [facility] is widely used.   Reference YellowSpecialist.at.      It is also important to  note that DXA measures bone density but does not   distinguish among causes of decreased bone density, which include primary versus   secondary osteoporosis (such as metabolic bone disorders or possible effects of   medications) and also other conditions (such as osteomalacia).  Clinical   considerations should determine what additional evaluation may be warranted to   exclude secondary conditions in a patient with low bone density.      Please note that reliable, valid comparisons can not be made between studies   which have been performed on different densitometers.  If clinically warranted,   follow up study performed at this site would best permit assessment of trend for   possible change in bone mineral density over time in comparison to this study.      Thank you for this referral.           Component Name      07/01/2018  ??       28.2 (L)     Vitamin D, 25 Hydroxy                 MRI Results (most recent):     Results from Hospital Encounter encounter on 06/05/18     MRI ANKLE LT WO CONT           Narrative  EXAMINATION: MRI left ankle without contrast      INDICATION: Left ankle pain, posterior tibialis injury      COMPARISON: None      TECHNIQUE: Multiplanar multiecho MRI sequences of the left ankle performed   without contrast.      FINDINGS:      Bones/joints:   -There is a nondisplaced incomplete fracture at the base of the medial malleolus   extending to the anterior cortex, with extensive surrounding marrow edema.   -Large Achilles enthesophyte with suspected surgical anchors at the region of   the Achilles insertion. Large plantar calcaneal spur.   -Talonavicular degenerative changes with a suspected os supranaviculare.   Prominent anterior processes of the calcaneus with anterior subtalar joint facet   degenerative change.   -Large os peroneum.      Ligaments:   -Calcaneofibular ligament appears intact. Anterior and posterior talofibular   ligaments appear intact. Anterior and posterior tibiofibular  ligaments appear   intact.   -Deltoid ligament with intrasubstance signal in the deep fibers, otherwise felt   to be largely intact. Spring ligament appears intact. Lisfranc ligament appears   largely intact.      Tendons:   -Mild tendinopathy of the distal Achilles tendon characterized by thickening,   involving roughly 3 cm length of the tendon, with suspected postsurgical   susceptibility artifact at the insertion.   -The peroneal tendons are intact.   -The flexor tendons at the ankle are intact.   -Extensor tendons at  the ankle are intact.      Soft tissues: Sinus tarsi fat signal preserved. Plantar fascia intact. Extensive   fatty atrophy of the abductor digiti minimi. Trace subtalar joint effusion.   Superficial edema about the ankle.              Impression  IMPRESSION:      Incomplete nondisplaced fracture of the distal tibia at the base of the medial   malleolus described above.      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 Large Achilles   enthesophyte.   -Plantar calcaneal spur.   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      Additional chronic bony changes as above. No other significant findings.      Regarding distal tibia fracture, patient was told to report to ordering   clinician's office which is nearby. Technologist attempted to call ordering   physician's office but without answer at the time of this dictation.                   Please see above section of this report.      I have personally reviewed the results of the above study. The interpretation of this study is my professional opinion.      Written by Hoyt Koch, [employer], as dictated by Dr. Silvano Bilis. I, Dr. Silvano Bilis, confirm that all documentation is accurate.

## 2018-07-29 ENCOUNTER — Encounter: Attending: Foot and Ankle Surgery | Primary: Registered Nurse

## 2018-07-30 ENCOUNTER — Ambulatory Visit: Attending: Foot and Ankle Surgery | Primary: Registered Nurse

## 2018-07-30 ENCOUNTER — Ambulatory Visit
Admit: 2018-07-30 | Discharge: 2018-07-30 | Payer: PRIVATE HEALTH INSURANCE | Attending: Foot and Ankle Surgery | Primary: Registered Nurse

## 2018-07-30 DIAGNOSIS — M722 Plantar fascial fibromatosis: Secondary | ICD-10-CM

## 2018-07-30 NOTE — Progress Notes (Signed)
AMBULATORY PROGRESS NOTE      Patient: Kyle Mueller             MRN: [id number]     SSN: [id number] Body mass index is 27.4 kg/m??.  Date of Birth: 1956-05-25     AGE: 62 y.o.       SEX: male    PCP: Kyle Mueller, Kyle G, NP     IMPRESSION/DIAGNOSIS AND TREATMENT PLAN     DIAGNOSES  1. Plantar fasciitis of right foot    2. Closed nondisplaced pilon fracture of left tibia with routine healing, subsequent encounter    3. Acute left ankle pain        Orders Placed This Encounter   ??? AMB SUPPLY ORDER   ??? [[id number]] Ankle Min 3V      Kyle Mueller understands his diagnoses and the proposed plan.   So, he was seen here today for followup for his left medial pilon fracture, posterior tibial fracture, that was discovered on MRI.     He has also mentioned having pain and discomfort to his right hindfoot.      On examination, he has tenderness to the plantar fascia on this right side.  So, his diagnoses also include plantar fasciitis on the right side.     Recommendations are listed as below for plantar fasciitis on the right side, and the CAM walker boot is continued on the left side.     His x-rays, three views of the left ankle today, show OA of the left ankle, degenerative spur to the dorsal part of the talus in the lateral radiographic x-ray, postop changes from the Achilles tendon surgery.  On this x-ray, it is hard to see the medial vertical shear fracture fragment.  I do not see it on these three x-rays.  At this time, I see good bone formation across this region.  It is to be noted that his fracture was discovered on MRI.      He had three views of his right foot x-rays, [facility], and these x-rays were done on July 26, 2018.  The results are listed below in the diagnostic section of the report revealing no fracture, but there are some degenerative changes to the foot and some degenerative changes to the insertion point of the Achilles tendon.     Plan:     1) HEP - Kyle Mueller, ATC, has educated the patient on plantar fascial exercises and/or stretches.   2) DME Order: Visco heel inserts.   3) Continue using the CAM walker boot as directed.   4) Continue supplementing with calcium and vitamin D as directed.   5) Continue activity modification as directed.      RTO - 4 weeks // PLEASE OBTAIN X-RAYS OF: left ankle 3 VIEWS      HPI AND EXAMINATION     Kyle Mueller IS A 62 y.o. male who presents to my outpatient office for follow up of a closed nondisplaced pilon fracture of the left tibia. At the last visit, I prescribed vitamin D3 2.000 units, calcium 500 mg, and Skelaxin 800 mg, provided a referral to PT, instructed the patient to continue activity modification as directed, and to continue using the CAM boot.     Since we saw him last, Mr. Kyle Mueller states that he has a new issue in his right foot. He reports that on Friday afternoon, he was walking through the yard going to  his house when he suddenly started experiencing significant pain in his right plantar medial foot. He recalls that it felt as though his heel "ripped". He notes that he was walking on flat ground. He was seen at the ED for this pain and was placed in a hard sole shoe. He states that he has been having difficulty sleeping at night, due to the pain.      As it regards to his left foot, Kyle Mueller states that he is still experiencing pain in his left medial ankle. He presents to the office today in his CAM walker boot. He is currently in PT. He has been supplementing with calcium and vitamin D. XR imaging has been reviewed with the patient.     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 He is not currently taking blood thinners, other than ASA.     Visit Vitals  BP 119/72 (BP 1 Location: Left arm, BP Patient Position: Sitting)   Pulse 95   Temp 97 ??F (36.1 ??C) (Oral)   Resp 18   Ht 6' (1.829 m)   Wt 202 lb (91.6 kg)   SpO2 97%   BMI 27.40 kg/m??      Appearance: Alert, well appearing and pleasant patient who is in no distress, oriented to person, place/time, and who follows commands.This patient is accompanied in the examination room by his wife. Dementia: no dementia  Psychiatric: Affect and mood are appropriate. Patient arrives to office via: with assistive device: CAM boot  HEENT: Head normocephalic & atraumatic. Both pupils are round, non icteric sclera              Eye: EOM are intact and sclera are clear               Neck: ROM WNL and JVD neck is not present              Hearings Intact, does not require hearing aid device  Respiratory: Breathing is unlabored without accessory chest muscle use  Cardiovascular/Peripheral Vascular: Normal Pulses to each foot    ANKLE/FOOT right    Tenderness: Mild tenderness to the inferomedial plantar fascia.     NT to the central heel.   Cutaneous: WNL.  Joint Motion: WNL.  Joint / Tendon Stability: No Ankle or Subtalar instability or joint laxity.                       No peroneal sublux ability or dislocation  Alignment: Pes cavus alignment.   Neuro Motor/Sensory: NL/NL.  Vascular: NL foot/ankle pulses.  Lymphatics: No extremity lymphedema, No calf swelling, no tenderness to calf muscles    ANKLE/FOOT left  ??  Gait: in CAM boot  Tenderness: No tenderness to the posterior tibial tendon behind the medial malleolus.   Cutaneous: Minimal swelling over the medial ankle.   Joint Motion: Can DF to neutral with knee flexed and extended                          30 degrees of PF                          Limited ST motion                          Limited inversion.   Joint / Tendon Stability: No Ankle or Subtalar  instability or joint laxity.                                             No peroneal sublux ability or dislocation  Alignment: Pes cavus alignment.   Neuro Motor/Sensory: NL/NL.  Vascular: NL foot/ankle pulses.  Lymphatics: No extremity lymphedema, No calf swelling, no tenderness to calf muscles.    CHART REVIEW      Past Medical History:   Diagnosis Date   ??? Asthma    ??? Diabetes (HCC)     CONTROLLED W/FOOD AND DIET   ??? 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 Erectile dysfunction    ??? Hypertension      Current Outpatient Medications   Medication Sig   ??? HYDROcodone-acetaminophen (NORCO) 5-325 mg per tablet Take 1 Tab by mouth.   ??? cholecalciferol (VITAMIN D3) 2,000 unit cap capsule Take 2,000 Units by mouth daily.   ??? calcium carbonate (CALCIUM 500) 500 mg calcium (1,250 mg) tablet Take 1 Tab by mouth daily.   ??? metaxalone (SKELAXIN) 800 mg tablet Take 1 Tab by mouth two (2) times daily as needed for Pain.   ??? diclofenac EC (VOLTAREN) 75 mg EC tablet TAKE 1 TABLET BY MOUTH 2 TIMES A DAY WITH MEALS   ??? aspirin (ASPIRIN) 325 mg tablet Take 325 mg by mouth.   ??? benzonatate (TESSALON) 200 mg capsule Take 200 mg by mouth.   ??? glucose blood VI test strips (ONETOUCH ULTRA BLUE TEST STRIP) strip TEST BLOOD GLUCOSE UP TO  QID   ??? cyclobenzaprine (FLEXERIL) 10 mg tablet Take 5-10 mg by mouth.   ??? insulin glargine-lixisenatide (SOLIQUA 100/33) 100 unit-33 mcg/mL inpn 22 Units by SubCUTAneous route.   ??? metoprolol succinate (TOPROL-XL) 25 mg XL tablet Take 25 mg by mouth.   ??? omeprazole (PRILOSEC) 40 mg capsule Take 40 mg by mouth.   ??? rosuvastatin (CRESTOR) 20 mg tablet Take 20 mg by mouth.   ??? tadalafil (CIALIS) 5 mg tablet Take 5 mg by mouth daily as needed.   ??? vardenafil (LEVITRA) 20 mg tablet Take 20 mg by mouth daily as needed for 10 doses.     No current facility-administered medications for this visit.      Allergies   Allergen Reactions   ??? Augmentin [Amoxicillin-Pot Clavulanate] Anaphylaxis, Shortness of Breath and Palpitations     BP DROPS   ??? Codeine Other (comments)     COMBATIVE   ??? Dilaudid [Hydromorphone (Bulk)] Other (comments)     HALLUCINATIONS   ??? Percocet [Oxycodone-Acetaminophen] Nausea and Vomiting     Past Surgical History:   Procedure Laterality Date   ??? CARDIAC SURG PROCEDURE UNLIST  05/25/2016    ??? HX BACK SURGERY  1991    L-4 L-5 DISCECTOMY   ??? HX MOHS PROCEDURES  2009    OBICI   ??? HX ORTHOPAEDIC      BILAT ACHILLES TENDON REPAIR     Social History     Occupational History   ??? Not on file   Tobacco Use   ??? Smoking status: Former Smoker     Packs/day: 1.00     Years: 25.00     Pack years: 25.00     Last attempt to quit: 12/24/1996     Years since quitting: 21.6   ??? Smokeless tobacco: Never Used  Substance and Sexual Activity   ??? Alcohol use: No     Frequency: Never     Comment: ocassional ( did not answer)    ??? Drug use: No   ??? Sexual activity: Yes     Partners: Female     Comment: E.D.     Family History   Problem Relation Age of Onset   ??? Inflammatory Bowel Dz Mother    ??? Cancer Father         LUNG CA   ??? Kidney Disease Sister         RENAL FAILURE   ??? Diabetes Sister         REVIEW OF SYSTEMS : 07/30/2018  ALL BELOW ARE Negative except : SEE HPI     Constitutional: Negative for fever, chills and weight loss. Neg Weight Loss  Cardiovascular: Negative for chest pain, claudication and leg swelling. SOB, DOE   Gastrointestinal/Urological: Negative for  pain, N/V/D/C, Blood in stool or urine,dysuria                         Hematuria, Incontinence, pelvic pain  Musculoskeletal: see HPI.  Neurological: Negative for dizziness and weakness, headaches,Visual Changes             Confusion,  Or Seizures,   Psychiatric/Behavioral: Negative for depression, memory loss and substance abuse.   Extremities:  Negative for hair changes, rash or skin lesion changes.  Hematologic: Negative for Bleeding problems, bruising, pallor or swollen lymph nodes.  Peripheral Vascular: No calf pain, vascular vein tenderness to calf pain              No calf throbbing, posterior knee throbbing pain     DIAGNOSTIC IMAGING  X RAYS/IMAGES DONE AT [facility] OFFICE 07/30/2018       Result ReportsCollapse All  2019  August  FOOT COMPLETE RT8/02/2018  [facility]  Result Impression       1. No evidence for fracture or dislocation.   2. Degenerative changes.    Signed By: Ignacia Felling, MD on 07/26/2018 11:56 AM   Result Narrative   EXAM: RIGHT FOOT RADIOGRAPHS    CLINICAL INDICATION/HISTORY: PAIN  ????> Additional: None    COMPARISON: 02/22/2017.    TECHNIQUE: 3 views of the right foot    _______________    FINDINGS:    BONES: No evidence for fracture or dislocation. Calcaneal spurs. Distal and proximal interphalangeal joint joint space narrowing narrowing of the first metatarsal interphalangeal joint.    SOFT TISSUES: Unremarkable.    _______________   Other Result Information   Interface, [employer] Rad Res - 07/26/2018 11:58 AM EDT  EXAM: RIGHT FOOT RADIOGRAPHS    CLINICAL INDICATION/HISTORY: PAIN    > Additional: None    COMPARISON: 02/22/2017.    TECHNIQUE: 3 views of the right foot    _______________    FINDINGS:    BONES: No evidence for fracture or dislocation. Calcaneal spurs. Distal and proximal interphalangeal joint joint space narrowing narrowing of the first metatarsal interphalangeal joint.    SOFT TISSUES: Unremarkable.    _______________    IMPRESSION      1. No evidence for fracture or dislocation.  2. Degenerative changes.    Signed By: Ignacia Felling, MD on 07/26/2018 11:56 AM   Status Results Details   ?? Encounter Summary       Written by Hoyt Koch, [employer], as dictated by Dr. Silvano Bilis. I, Dr. Casimiro Needle  Cheng Dec, confirm that all documentation is accurate.

## 2018-07-30 NOTE — Patient Instructions (Signed)
Plantar Fasciitis: Exercises      Complete at least 2 sessions of each exercise each day to get started (no days off). If beneficial and able to fit into your schedule, more sessions are recommended. Nothing should cause an increase in pain or swelling.      Plantar fascia self-massage    1. Sit in a chair and remove sock and shoe.   2. Place your affected foot on a firm, tube-shaped object, such as a can or frozen water bottle. You may progress to a tennis ball if these other items become to easy.   3. Roll your foot back and forth over the object to massage the bottom of your foot, from just in front of your heel to the balls of your feet.  4. If you want to do ice massage, fill a water bottle about three-fourths of the way full and freeze before using.  5. Continue for 3 to 5 minutes.      Plantar fascia stretch    1. Sit in a chair and put your affected foot on your other knee.  2. Hold the heel of your foot in one hand, and grasp your toes with the other hand.  3. Pull on your heel (toward your body), and at the same time pull your toes back with your other hand.  4. You should feel a stretch along the bottom of your foot.  5. Hold for 30 seconds.  6. Repeat 3 times.      Towel stretch (calf)    1. Sit with your legs extended and knees straight.  2. Place a towel around your foot just under the toes.  3. Hold each end of the towel in each hand, with your hands above your knees.  4. Pull back with the towel so that your foot stretches toward you.  5. Hold the position for 30 seconds.  6. Repeat 3 times.  7. If stretch becomes too easy and you are no longer able to feel a stretch, discontinue exercise in favor of standing stretches.       Calf wall stretch    1. Stand facing a wall with your hands on the wall at about eye level. Put your affected leg about a step behind your other leg.  2. Keeping your back leg straight and your back heel on the floor, bend  your front knee and gently bring your hip and chest toward the wall until you feel a stretch in the calf of your back leg.  3. Hold the stretch for 30 seconds.  4. Repeat 3 times per leg.  5. Repeat steps 1 through 4, but this time keep your back knee bent.      Calf stair stretch      1. Standing on a stair so that the edge of the stair hits the midfoot, let one foot drop down below the stair so that a stretch is felt in the back of the lower leg. Keep knee straight.   2. Hold for 30 seconds, 3 times per leg.  3. Repeat the stretch with your knee slightly bent.      If you have any questions, please call [facility] and ask for Elyssia Strausser, Certified Athletic Trainer.

## 2018-07-30 NOTE — Progress Notes (Addendum)
Progress Notes by Netta Corrigan, MD at 07/30/18 1050                Author: Netta Corrigan, MD  Service: --  Author Type: Physician       Filed: 08/01/18 0850  Encounter Date: 07/30/2018  Status: Signed          Editor: Netta Corrigan, MD (Physician)          Related Notes: Original Note by Netta Corrigan, MD (Physician) filed at 07/30/18 1253               AMBULATORY PROGRESS NOTE         Patient: Kyle Mueller              MRN: [id number]     SSN:  [id number] Body mass index is 27.4 kg/m??.   Date of Birth: 1956/05/24     AGE:  62 y.o.       SEX: male      PCP: Cordelia Poche, NP         IMPRESSION/DIAGNOSIS AND TREATMENT PLAN        DIAGNOSES      1.  Plantar fasciitis of right foot      2.  Closed nondisplaced pilon fracture of left tibia with routine healing, subsequent encounter         3.  Acute left ankle pain              Orders Placed This Encounter        ?  AMB SUPPLY ORDER        ?  [[id number]] Ankle Min 3V         Kyle Mueller understands his  diagnoses and the proposed plan.    So, he was seen here today for followup for his left medial pilon fracture, posterior tibial fracture, that was discovered on MRI.       He has also mentioned having pain and discomfort to his right hindfoot.        On examination, he has tenderness to the plantar fascia on this right side.  So, his diagnoses also include plantar fasciitis on the right side.       Recommendations are listed as below for plantar fasciitis on the right side, and the CAM walker boot is continued on the left side.       His x-rays, three views of the left ankle today, show OA of the left ankle, degenerative spur to the dorsal part of the talus in the lateral radiographic x-ray, postop changes from the Achilles tendon surgery.  On this x-ray, it is hard to see the medial  vertical shear fracture fragment.  I do not see it on these three x-rays.  At this time, I see good bone formation across this region.  It is to be noted that  his fracture was discovered on MRI.        He had three views of his right foot x-rays, for which these were done in the [facility] system, and these x-rays were done on July 26, 2018.  The results are listed below in the diagnostic section of the report revealing no fracture, but there are some  degenerative changes to the foot and some degenerative changes to the insertion point of the Achilles tendon.       Plan:      1) HEP - Carin Hock, ATC, has educated the patient on plantar  fascial exercises and/or stretches.    2) DME Order: Visco heel inserts.    3) Continue using the CAM walker boot as directed.    4) Continue supplementing with calcium and vitamin D as directed.    5) Continue activity modification as directed.        RTO - 4 weeks // PLEASE OBTAIN X-RAYS OF: left ankle 3 VIEWS          HPI AND EXAMINATION        Kyle Mueller IS A 62 y.o.  male who presents to my outpatient office for follow up of a closed nondisplaced pilon fracture of the left tibia. At the last visit, I prescribed vitamin D3 2.000 units, calcium 500 mg, and Skelaxin  800 mg, provided a referral to PT, instructed the patient to continue activity modification as directed, and to continue using the CAM boot.       Since we saw him last, Kyle Mueller states that he has a new issue in his right foot. He reports that on Friday afternoon, he was walking through the yard going to his house when he suddenly started experiencing significant pain in his right plantar medial  foot. He recalls that it felt as though his heel "ripped". He notes that he was walking on flat ground. He was seen at the ED for this pain and was placed in a hard sole shoe. He states that he has been having difficulty sleeping at night, due to the  pain.        As it regards to his left foot, Kyle Mueller states that he is still experiencing pain in his left medial ankle. He presents to the office today in his CAM walker boot. He is currently in PT. He has been  supplementing with calcium and vitamin D. XR imaging  has been reviewed with the patient.       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 He is not currently taking blood thinners, other than ASA.       Visit Vitals      BP  119/72 (BP 1 Location: Left arm, BP Patient Position: Sitting)     Pulse  95     Temp  97 ??F (36.1 ??C) (Oral)     Resp  18     Ht  6' (1.829 m)     Wt  202 lb (91.6 kg)     SpO2  97%        BMI  27.40 kg/m??        Appearance: Alert, well appearing and pleasant patient who is in no distress, oriented to person, place/time, and  who follows commands.This patient is accompanied in the examination room by his wife. Dementia: no dementia   Psychiatric: Affect and mood are appropriate. Patient arrives to office via: with assistive device: CAM boot   HEENT: Head normocephalic & atraumatic. Both pupils are round, non icteric sclera               Eye: EOM are intact and sclera are clear                Neck: ROM WNL and JVD neck is not present               Hearings Intact, does not require hearing aid device   Respiratory: Breathing is unlabored without accessory chest muscle use   Cardiovascular/Peripheral Vascular: Normal Pulses to each foot  ANKLE/FOOT right      Tenderness: Mild tenderness to the inferomedial plantar fascia.      NT to the central heel.    Cutaneous: WNL.   Joint Motion: WNL.   Joint / Tendon Stability: No Ankle or Subtalar instability or joint laxity.                        No peroneal sublux ability or dislocation   Alignment: Pes cavus alignment.    Neuro Motor/Sensory: NL/NL.   Vascular: NL foot/ankle pulses.   Lymphatics: No extremity lymphedema, No calf swelling, no tenderness to calf muscles      ANKLE/FOOT left   ??   Gait: in CAM boot   Tenderness: No tenderness to the posterior tibial tendon behind the medial malleolus.    Cutaneous: Minimal swelling over the medial ankle.    Joint Motion: Can DF to neutral with knee flexed and extended                            30 degrees of PF                           Limited ST motion                           Limited inversion.    Joint / Tendon Stability: No Ankle or Subtalar instability or joint laxity.                                              No peroneal sublux ability or dislocation   Alignment: Pes cavus alignment.    Neuro Motor/Sensory: NL/NL.   Vascular: NL foot/ankle pulses.   Lymphatics: No extremity lymphedema, No calf swelling, no tenderness to calf muscles.        CHART REVIEW          Past Medical History:        Diagnosis  Date         ?  Asthma       ?  Diabetes (HCC)            CONTROLLED W/FOOD AND DIET         ?  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  Erectile dysfunction           ?  Hypertension            Current Outpatient Medications        Medication  Sig         ?  HYDROcodone-acetaminophen (NORCO) 5-325 mg per tablet  Take 1 Tab by mouth.     ?  cholecalciferol (VITAMIN D3) 2,000 unit cap capsule  Take 2,000 Units by mouth daily.     ?  calcium carbonate (CALCIUM 500) 500 mg calcium (1,250 mg) tablet  Take 1 Tab by mouth daily.     ?  metaxalone (SKELAXIN) 800 mg tablet  Take 1 Tab by mouth two (2) times daily as needed for Pain.     ?  diclofenac EC (VOLTAREN) 75 mg EC tablet  TAKE 1 TABLET BY MOUTH 2 TIMES A DAY WITH MEALS     ?  aspirin (ASPIRIN) 325 mg tablet  Take 325 mg by mouth.     ?  benzonatate (TESSALON) 200 mg capsule  Take 200 mg by mouth.     ?  glucose blood VI test strips (ONETOUCH ULTRA BLUE TEST STRIP) strip  TEST BLOOD GLUCOSE UP TO  QID     ?  cyclobenzaprine (FLEXERIL) 10 mg tablet  Take 5-10 mg by mouth.     ?  insulin glargine-lixisenatide (SOLIQUA 100/33) 100 unit-33 mcg/mL inpn  22 Units by SubCUTAneous route.     ?  metoprolol succinate (TOPROL-XL) 25 mg XL tablet  Take 25 mg by mouth.     ?  omeprazole (PRILOSEC) 40 mg capsule  Take 40 mg by mouth.         ?  rosuvastatin (CRESTOR) 20 mg tablet  Take 20 mg by mouth.         ?  tadalafil (CIALIS) 5 mg  tablet  Take 5 mg by mouth daily as needed.         ?  vardenafil (LEVITRA) 20 mg tablet  Take 20 mg by mouth daily as needed for 10 doses.          No current facility-administered medications for this visit.           Allergies        Allergen  Reactions         ?  Augmentin [Amoxicillin-Pot Clavulanate]  Anaphylaxis, Shortness of Breath and Palpitations             BP DROPS         ?  Codeine  Other (comments)             COMBATIVE         ?  Dilaudid [Hydromorphone (Bulk)]  Other (comments)             HALLUCINATIONS         ?  Percocet [Oxycodone-Acetaminophen]  Nausea and Vomiting          Past Surgical History:         Procedure  Laterality  Date          ?  CARDIAC SURG PROCEDURE UNLIST    05/25/2016     ?  HX BACK SURGERY    1991          L-4 L-5 DISCECTOMY          ?  HX MOHS PROCEDURES    2009          OBICI          ?  HX ORTHOPAEDIC              BILAT ACHILLES TENDON REPAIR          Social History          Occupational History        ?  Not on file       Tobacco Use         ?  Smoking status:  Former Smoker              Packs/day:  1.00         Years:  25.00         Pack years:  25.00         Last attempt to quit:  12/24/1996         Years since quitting:  21.6         ?  Smokeless tobacco:  Never Used       Substance and Sexual Activity         ?  Alcohol use:  No              Frequency:  Never             Comment: ocassional ( did not answer)          ?  Drug use:  No     ?  Sexual activity:  Yes              Partners:  Female             Comment: E.D.          Family History         Problem  Relation  Age of Onset          ?  Inflammatory Bowel Dz  Mother       ?  Cancer  Father                LUNG CA          ?  Kidney Disease  Sister                RENAL FAILURE          ?  Diabetes  Sister                REVIEW OF SYSTEMS : 07/30/2018  ALL  BELOW ARE Negative except : SEE HPI        Constitutional: Negative for fever, chills and weight loss. Neg Weight Loss   Cardiovascular: Negative for chest pain,  claudication and leg swelling. SOB, DOE    Gastrointestinal/Urological: Negative for  pain, N/V/D/C, Blood in stool or urine,dysuria                         Hematuria, Incontinence, pelvic pain   Musculoskeletal: see HPI.   Neurological: Negative for dizziness and weakness, headaches,Visual Changes             Confusion,  Or Seizures,    Psychiatric/Behavioral: Negative for depression, memory loss and substance abuse.    Extremities:  Negative for hair changes, rash or skin lesion changes.   Hematologic: Negative for Bleeding problems, bruising, pallor or swollen lymph nodes.   Peripheral Vascular: No calf pain, vascular vein tenderness to calf pain               No calf throbbing, posterior knee throbbing pain         DIAGNOSTIC IMAGING  X RAYS/IMAGES DONE  AT [facility] OFFICE 07/30/2018          Result Reports Collapse All   2019   August     FOOT COMPLETE RT8/02/2018   [facility]     Result Impression         1. No evidence for fracture or dislocation.  2. Degenerative changes.    Signed By: Ignacia Felling, MD on 07/26/2018 11:56 AM     Result Narrative     EXAM: RIGHT FOOT RADIOGRAPHS    CLINICAL INDICATION/HISTORY: PAIN  ????> Additional: None    COMPARISON: 02/22/2017.    TECHNIQUE:  3 views of the right foot    _______________    FINDINGS:    BONES: No evidence for fracture or dislocation. Calcaneal spurs. Distal and proximal interphalangeal joint joint space narrowing narrowing of  the first metatarsal interphalangeal  joint.    SOFT TISSUES: Unremarkable.    _______________     Other Result Information       Interface, [employer] Rad Res - 07/26/2018 11:58 AM EDT  EXAM: RIGHT FOOT RADIOGRAPHS    CLINICAL INDICATION/HISTORY: PAIN    > Additional:  None    COMPARISON: 02/22/2017.    TECHNIQUE: 3 views of the right foot    _______________    FINDINGS:    BONES: No evidence for fracture or dislocation. Calcaneal spurs. Distal and proximal interphalangeal joint joint  space narrowing narrowing of the first  metatarsal interphalangeal joint.    SOFT TISSUES: Unremarkable.    _______________    IMPRESSION      1. No evidence for fracture or dislocation.  2. Degenerative changes.     Signed By: Ignacia Felling, MD on 07/26/2018 11:56 AM        Status  Results Details     ??  Encounter Summary           Written by Hoyt Koch, ScribeKick, as dictated by Dr. Silvano Bilis. I, Dr. Silvano Bilis, confirm that all documentation is accurate.

## 2018-08-18 ENCOUNTER — Encounter

## 2018-08-18 MED ORDER — CHOLECALCIFEROL (VITAMIN D3) 2,000 UNIT CAPSULE
ORAL_CAPSULE | Freq: Every day | ORAL | 0 refills | Status: DC
Start: 2018-08-18 — End: 2019-03-10

## 2018-08-18 MED ORDER — CALCIUM CARBONATE 500 MG (1250 MG) TAB
500 mg calcium (1,250 mg) | ORAL_TABLET | Freq: Every day | ORAL | 0 refills | Status: DC
Start: 2018-08-18 — End: 2019-03-10

## 2018-08-18 NOTE — Telephone Encounter (Signed)
Per pharmacy - patient is requesting a 90 day supply. Please sign if appropriate.    Requested Prescriptions     Pending Prescriptions Disp Refills   ??? calcium carbonate (CALCIUM 500) 500 mg calcium (1,250 mg) tablet 90 Tab 0     Sig: Take 1 Tab by mouth daily.   ??? cholecalciferol (VITAMIN D3) 2,000 unit cap capsule 90 Cap 0     Sig: Take 2,000 Units by mouth daily.

## 2018-08-27 ENCOUNTER — Encounter: Attending: Foot and Ankle Surgery | Primary: Registered Nurse

## 2018-08-27 NOTE — Progress Notes (Deleted)
AMBULATORY PROGRESS NOTE      Patient: Kyle Mueller             MRN: [id number]     SSN: [id number] There is no height or weight on file to calculate BMI.  Date of Birth: [birth year]     AGE: 62 y.o.       SEX: male    PCP: Cordelia Poche, NP     IMPRESSION/DIAGNOSIS AND TREATMENT PLAN     DIAGNOSES  No diagnosis found.    No orders of the defined types were placed in this encounter.     [name] understands his diagnoses and the proposed plan.     Plan:    1)   2)      RTO -      HPI AND EXAMINATION     [name] IS A 62 y.o. male who presents to my outpatient office for follow up of a closed nondisplaced pilon fracture of the left tibia and plantar fasciitis of the right foot. At the last visit, I provided an order for Visco heel inserts, provided an HEP for plantar fascial stretches, instructed the patient to continue activity modification as directed, to continue using the CAM boot, and to continue supplementing with calcium and vitamin D.     Since we saw him last, 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 He is not currently taking blood thinners, other than ASA.     There were no vitals taken for this visit.  Appearance: Alert, well appearing and pleasant patient who is in no distress, oriented to person, place/time, and who follows commands.This patient is accompanied in the examination room by his wife. Dementia: no dementia  Psychiatric: Affect and mood are appropriate. Patient arrives to office via: with assistive device: CAM boot  HEENT: Head normocephalic & atraumatic. Both pupils are round, non icteric sclera              Eye: EOM are intact and sclera are clear               Neck: ROM WNL and JVD neck is not present              Hearings Intact, does not require hearing aid device  Respiratory: Breathing is unlabored without accessory chest muscle use  Cardiovascular/Peripheral Vascular: Normal Pulses to each foot    ANKLE/FOOT right     Tenderness: Mild tenderness to the inferomedial plantar fascia.     NT to the central heel.   Cutaneous: WNL.  Joint Motion: WNL.  Joint / Tendon Stability: No Ankle or Subtalar instability or joint laxity.                       No peroneal sublux ability or dislocation  Alignment: Pes cavus alignment.   Neuro Motor/Sensory: NL/NL.  Vascular: NL foot/ankle pulses.  Lymphatics: No extremity lymphedema, No calf swelling, no tenderness to calf muscles    ANKLE/FOOT left  ??  Gait: in CAM boot  Tenderness: No tenderness to the posterior tibial tendon behind the medial malleolus.   Cutaneous: Minimal swelling over the medial ankle.   Joint Motion: Can DF to neutral with knee flexed and extended                          30 degrees of PF  Limited ST motion                          Limited inversion.   Joint / Tendon Stability: No Ankle or Subtalar instability or joint laxity.                                             No peroneal sublux ability or dislocation  Alignment: Pes cavus alignment.   Neuro Motor/Sensory: NL/NL.  Vascular: NL foot/ankle pulses.  Lymphatics: No extremity lymphedema, No calf swelling, no tenderness to calf muscles.    CHART REVIEW     Past Medical History:   Diagnosis Date   ??? Asthma    ??? Diabetes (HCC)     CONTROLLED W/FOOD AND DIET   ??? 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 Erectile dysfunction    ??? Hypertension      Current Outpatient Medications   Medication Sig   ??? calcium carbonate (CALCIUM 500) 500 mg calcium (1,250 mg) tablet Take 1 Tab by mouth daily.   ??? cholecalciferol (VITAMIN D3) 2,000 unit cap capsule Take 2,000 Units by mouth daily.   ??? HYDROcodone-acetaminophen (NORCO) 5-325 mg per tablet Take 1 Tab by mouth.   ??? metaxalone (SKELAXIN) 800 mg tablet Take 1 Tab by mouth two (2) times daily as needed for Pain.   ??? diclofenac EC (VOLTAREN) 75 mg EC tablet TAKE 1 TABLET BY MOUTH 2 TIMES A DAY WITH MEALS    ??? aspirin (ASPIRIN) 325 mg tablet Take 325 mg by mouth.   ??? benzonatate (TESSALON) 200 mg capsule Take 200 mg by mouth.   ??? glucose blood VI test strips (ONETOUCH ULTRA BLUE TEST STRIP) strip TEST BLOOD GLUCOSE UP TO  QID   ??? cyclobenzaprine (FLEXERIL) 10 mg tablet Take 5-10 mg by mouth.   ??? insulin glargine-lixisenatide (SOLIQUA 100/33) 100 unit-33 mcg/mL inpn 22 Units by SubCUTAneous route.   ??? metoprolol succinate (TOPROL-XL) 25 mg XL tablet Take 25 mg by mouth.   ??? omeprazole (PRILOSEC) 40 mg capsule Take 40 mg by mouth.   ??? rosuvastatin (CRESTOR) 20 mg tablet Take 20 mg by mouth.   ??? tadalafil (CIALIS) 5 mg tablet Take 5 mg by mouth daily as needed.   ??? vardenafil (LEVITRA) 20 mg tablet Take 20 mg by mouth daily as needed for 10 doses.     No current facility-administered medications for this visit.      Allergies   Allergen Reactions   ??? Augmentin [Amoxicillin-Pot Clavulanate] Anaphylaxis, Shortness of Breath and Palpitations     BP DROPS   ??? Codeine Other (comments)     COMBATIVE   ??? Dilaudid [Hydromorphone (Bulk)] Other (comments)     HALLUCINATIONS   ??? Percocet [Oxycodone-Acetaminophen] Nausea and Vomiting     Past Surgical History:   Procedure Laterality Date   ??? CARDIAC SURG PROCEDURE UNLIST  05/25/2016   ??? HX BACK SURGERY  1991    L-4 L-5 DISCECTOMY   ??? HX MOHS PROCEDURES  2009    OBICI   ??? HX ORTHOPAEDIC      BILAT ACHILLES TENDON REPAIR     Social History     Occupational History   ??? Not on file   Tobacco Use   ??? Smoking status: Former Smoker     Packs/day: 1.00  Years: 25.00     Pack years: 25.00     Last attempt to quit: 12/24/1996     Years since quitting: 21.6   ??? Smokeless tobacco: Never Used   Substance and Sexual Activity   ??? Alcohol use: No     Frequency: Never     Comment: ocassional ( did not answer)    ??? Drug use: No   ??? Sexual activity: Yes     Partners: Female     Comment: E.D.     Family History   Problem Relation Age of Onset   ??? Inflammatory Bowel Dz Mother    ??? Cancer Father          LUNG CA   ??? Kidney Disease Sister         RENAL FAILURE   ??? Diabetes Sister         REVIEW OF SYSTEMS : 08/27/2018  ALL BELOW ARE Negative except : SEE HPI     Constitutional: Negative for fever, chills and weight loss. Neg Weight Loss  Cardiovascular: Negative for chest pain, claudication and leg swelling. SOB, DOE   Gastrointestinal/Urological: Negative for  pain, N/V/D/C, Blood in stool or urine,dysuria                         Hematuria, Incontinence, pelvic pain  Musculoskeletal: see HPI.  Neurological: Negative for dizziness and weakness, headaches,Visual Changes             Confusion,  Or Seizures,   Psychiatric/Behavioral: Negative for depression, memory loss and substance abuse.   Extremities:  Negative for hair changes, rash or skin lesion changes.  Hematologic: Negative for Bleeding problems, bruising, pallor or swollen lymph nodes.  Peripheral Vascular: No calf pain, vascular vein tenderness to calf pain              No calf throbbing, posterior knee throbbing pain     DIAGNOSTIC IMAGING        No notes on file    Please see above section of this report.    I have personally reviewed the results of the above study. The interpretation of this study is my professional opinion.    Written by Hoyt Koch, ScribeKick, as dictated by Dr. Silvano Bilis. I, Dr. Silvano Bilis, confirm that all documentation is accurate.

## 2018-09-02 ENCOUNTER — Ambulatory Visit: Attending: Foot and Ankle Surgery | Primary: Registered Nurse

## 2018-09-02 ENCOUNTER — Ambulatory Visit
Admit: 2018-09-02 | Discharge: 2018-09-02 | Payer: PRIVATE HEALTH INSURANCE | Attending: Foot and Ankle Surgery | Primary: Registered Nurse

## 2018-09-02 DIAGNOSIS — S82875D Nondisplaced pilon fracture of left tibia, subsequent encounter for closed fracture with routine healing: Secondary | ICD-10-CM

## 2018-09-02 MED ORDER — CA 600 MG-D3 800 UNIT-MAGNES 40 MG-ZINC-COP-MANG-BORON CHEWABLE TABLET
600 mg calcium- 800 unit-40 mg | ORAL_TABLET | Freq: Every day | ORAL | 2 refills | Status: AC
Start: 2018-09-02 — End: ?

## 2018-09-02 MED ORDER — DICLOFENAC 75 MG TAB, DELAYED RELEASE
75 mg | ORAL_TABLET | Freq: Two times a day (BID) | ORAL | 2 refills | Status: DC
Start: 2018-09-02 — End: 2019-03-10

## 2018-09-02 MED ORDER — METAXALONE 800 MG TAB
800 mg | ORAL_TABLET | Freq: Two times a day (BID) | ORAL | 2 refills | Status: AC | PRN
Start: 2018-09-02 — End: ?

## 2018-09-02 NOTE — Progress Notes (Signed)
1. Have you been to the ER, urgent care clinic since your last visit?  Hospitalized since your last visit?No    2. Have you seen or consulted any other health care providers outside of the [facility] since your last visit?  Include any pap smears or colon screening. No

## 2018-09-02 NOTE — Patient Instructions (Signed)
Broken Lower Leg: Care Instructions  Your Care Instructions    Treatment for your broken leg will depend on how bad the break is. Your doctor may have put your lower leg in a splint or a cast to allow it to heal or keep it stable until you see another doctor. It may take weeks or months for your leg to heal. You can help it heal with some care at home.  You heal best when you take good care of yourself. Eat a variety of healthy foods, and don't smoke.  Follow-up care is a key part of your treatment and safety. Be sure to make and go to all appointments, and call your doctor if you are having problems. It's also a good idea to know your test results and keep a list of the medicines you take.  How can you care for yourself at home?  ?? Put ice or a cold pack on your lower leg for 10 to 20 minutes at a time. Try to do this every 1 to 2 hours for the next 3 days (when you are awake). Put a thin cloth between the ice and your cast or splint. Keep your cast or splint dry.  ?? Follow the cast care instructions your doctor gives you. If you have a splint, do not take it off unless your doctor tells you to.  ?? Be safe with medicines. Take pain medicines exactly as directed.  ? If the doctor gave you a prescription medicine for pain, take it as prescribed.  ? If you are not taking a prescription pain medicine, ask your doctor if you can take an over-the-counter medicine.  ?? Do not put weight on your leg unless your doctor tells you to. Use crutches to walk.  ?? Prop up your leg on pillows when you sit or lie down in the first few days after the injury. Keep your leg higher than the level of your heart. This will help reduce swelling.  ?? Follow instructions for exercises to keep your leg strong.  ?? Wiggle your toes often to reduce swelling and stiffness.  When should you call for help?  Call 911 anytime you think you may need emergency care. For example, call if:   ?? ?? You have chest pain, are short of breath, or you cough up blood.   ?? ?? You are very sleepy and you have trouble waking up.   ??Call your doctor now or seek immediate medical care if:  ?? ?? You have new or worse nausea or vomiting.   ?? ?? You have new or worse pain.   ?? ?? Your foot is cool or pale or changes color.   ?? ?? You have tingling, weakness, or numbness in your toes.   ?? ?? Your cast or splint feels too tight.   ?? ?? You have signs of a blood clot in your leg (called a deep vein thrombosis), such as:  ? Pain in your calf, back of the knee, thigh, or groin.  ? Redness or swelling in your leg.   ??Watch closely for changes in your health, and be sure to contact your doctor if:  ?? ?? You have a problem with your splint or cast.   ?? ?? You do not get better as expected.   Where can you learn more?  Go to http://www.healthwise.net/[employer].  Enter L198 in the search box to learn more about "Broken Lower Leg: Care Instructions."  Current as of: September 12, 2017  Content Version: 12.1  ?? 2006-2019 [employer]. Care instructions adapted under license by [employer] (which disclaims liability or warranty for this information). If you have questions about a medical condition or this instruction, always ask your healthcare professional. [place] any warranty or liability for your use of this information.

## 2018-09-02 NOTE — Progress Notes (Signed)
AMBULATORY PROGRESS NOTE      Patient: Kyle Mueller             MRN: [id number]     SSN: [id number] Body mass index is 29 kg/m??.  Date of Birth: 1956-06-24     AGE: 62 y.o.       SEX: male    PCP: Kyle Poche, NP     IMPRESSION/DIAGNOSIS AND TREATMENT PLAN     DIAGNOSES  1. Closed nondisplaced pilon fracture of left tibia with routine healing, subsequent encounter    2. Muscle spasms of both lower extremities        Orders Placed This Encounter   ??? [73610] Ankle Min 3V   ??? Ca-D3-mag-zinc-cop-mang-boron (CALTRATE 600-D PLUS MINERALS) 600 mg calcium- 800 unit-40 mg chew   ??? diclofenac EC (VOLTAREN) 75 mg EC tablet   ??? metaxalone (SKELAXIN) 800 mg tablet      Kyle Mueller understands his diagnoses and the proposed plan.   X-rays show:     1. Healed, medial malleolar vertical shear type fracture.   2. 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     He is doing very well at this point.  We will see him prn.  We did wish him well.      Plan:    1) Continue activity modification as directed.    2) Refill Voltaren 75 mg: 1 PO BID; 60 tablets, 2 refills.  3) Refill Caltrate 600 mg: 1 PO every day; 30 tablets, 2 refills.   3) Refill Skelaxin 800 mg: 1 PO BIDPRN; 60 tablets, 2 refills.      RTO - PRN     HPI AND EXAMINATION     Kyle Mueller IS A 62 y.o. male who presents to my outpatient office for follow up of a closed nondisplaced pilon fracture of the left tibia and plantar fasciitis of the right foot. At the last visit, I provided an order for Visco heel inserts, provided an HEP for plantar fascial stretches, instructed the patient to continue activity modification as  directed, to continue using the CAM boot, and to continue supplementing with calcium and vitamin D.     Since we saw him last, Kyle Mueller states that he is doing well and denies any pain or soreness. He notes that he has been supplementing with Caltrate, which he believes is helping with his healing. He has also been taking Voltaren 75 mg once a day, which he states has also been helping with his pain. He inquires about whether he can receive refills on all of these medications. The patient notes that he has been modifying his activity, even while hunting. He states that he does not climb into deer stands or anything over 3 feet.     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 He is not currently taking blood thinners, other than ASA.     Visit Vitals  BP 141/82   Pulse 71   Temp 96.6 ??F (35.9 ??C) (Oral)   Resp 18   Ht 6' (1.829 m)   Wt 213 lb 12.8 oz (97 kg)   SpO2 94%   BMI 29.00  kg/m??     Appearance: Alert, well appearing and pleasant patient who is in no distress, oriented to person, place/time, and who follows commands.This patient is accompanied in the examination room by his wife. Dementia: no dementia  Psychiatric: Affect and mood are appropriate. Patient arrives to office via: without assistive device  HEENT: Head normocephalic & atraumatic. Both pupils are round, non icteric sclera              Eye: EOM are intact and sclera are clear               Neck: ROM WNL and JVD neck is not present              Hearings Intact, does not require hearing aid device  Respiratory: Breathing is unlabored without accessory chest muscle use  Cardiovascular/Peripheral Vascular: Normal Pulses to each foot    ANKLE/FOOT left  ??  Gait: normal  Tenderness: No tenderness to the posterior tibial tendon behind the medial malleolus.   Cutaneous: Minimal swelling over the medial ankle.   Joint Motion: Can DF to neutral with knee flexed and extended                          30 degrees of PF                           Limited ST motion                          Limited inversion.   Joint / Tendon Stability: No Ankle or Subtalar instability or joint laxity.                                             No peroneal sublux ability or dislocation  Alignment: Pes cavus alignment.   Neuro Motor/Sensory: NL/NL.  Vascular: NL foot/ankle pulses.  Lymphatics: No extremity lymphedema, No calf swelling, no tenderness to calf muscles.    CHART REVIEW     Past Medical History:   Diagnosis Date   ??? Asthma    ??? Diabetes (HCC)     CONTROLLED W/FOOD AND DIET   ??? 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 Erectile dysfunction    ??? Hypertension      Current Outpatient Medications   Medication Sig   ??? Ca-D3-mag-zinc-cop-mang-boron (CALTRATE 600-D PLUS MINERALS) 600 mg calcium- 800 unit-40 mg chew Take 600 mg by mouth daily.   ??? diclofenac EC (VOLTAREN) 75 mg EC tablet Take 1 Tab by mouth two (2) times daily (with meals).   ??? metaxalone (SKELAXIN) 800 mg tablet Take 1 Tab by mouth two (2) times daily as needed for Pain.   ??? calcium carbonate (CALCIUM 500) 500 mg calcium (1,250 mg) tablet Take 1 Tab by mouth daily.   ??? cholecalciferol (VITAMIN D3) 2,000 unit cap capsule Take 2,000 Units by mouth daily.   ??? HYDROcodone-acetaminophen (NORCO) 5-325 mg per tablet Take 1 Tab by mouth.   ??? diclofenac EC (VOLTAREN) 75 mg EC tablet TAKE 1 TABLET BY MOUTH 2 TIMES A DAY WITH MEALS   ??? aspirin (ASPIRIN) 325 mg tablet Take 325 mg by mouth.   ??? benzonatate (TESSALON) 200 mg capsule Take 200 mg by mouth.   ???  glucose blood VI test strips (ONETOUCH ULTRA BLUE TEST STRIP) strip TEST BLOOD GLUCOSE UP TO  QID   ??? cyclobenzaprine (FLEXERIL) 10 mg tablet Take 5-10 mg by mouth.   ??? insulin glargine-lixisenatide (SOLIQUA 100/33) 100 unit-33 mcg/mL inpn 22 Units by SubCUTAneous route.   ??? metoprolol succinate (TOPROL-XL) 25 mg XL tablet Take 25 mg by mouth.   ??? omeprazole (PRILOSEC) 40 mg capsule Take 40 mg by mouth.    ??? rosuvastatin (CRESTOR) 20 mg tablet Take 20 mg by mouth.   ??? tadalafil (CIALIS) 5 mg tablet Take 5 mg by mouth daily as needed.   ??? vardenafil (LEVITRA) 20 mg tablet Take 20 mg by mouth daily as needed for 10 doses.     No current facility-administered medications for this visit.      Allergies   Allergen Reactions   ??? Augmentin [Amoxicillin-Pot Clavulanate] Anaphylaxis, Shortness of Breath and Palpitations     BP DROPS   ??? Codeine Other (comments)     COMBATIVE   ??? Dilaudid [Hydromorphone (Bulk)] Other (comments)     HALLUCINATIONS   ??? Percocet [Oxycodone-Acetaminophen] Nausea and Vomiting     Past Surgical History:   Procedure Laterality Date   ??? CARDIAC SURG PROCEDURE UNLIST  05/25/2016   ??? HX BACK SURGERY  1991    L-4 L-5 DISCECTOMY   ??? HX MOHS PROCEDURES  2009    OBICI   ??? HX ORTHOPAEDIC      BILAT ACHILLES TENDON REPAIR     Social History     Occupational History   ??? Not on file   Tobacco Use   ??? Smoking status: Former Smoker     Packs/day: 1.00     Years: 25.00     Pack years: 25.00     Last attempt to quit: 12/24/1996     Years since quitting: 21.7   ??? Smokeless tobacco: Never Used   Substance and Sexual Activity   ??? Alcohol use: No     Frequency: Never     Comment: ocassional ( did not answer)    ??? Drug use: No   ??? Sexual activity: Yes     Partners: Female     Comment: E.D.     Family History   Problem Relation Age of Onset   ??? Inflammatory Bowel Dz Mother    ??? Cancer Father         LUNG CA   ??? Kidney Disease Sister         RENAL FAILURE   ??? Diabetes Sister         REVIEW OF SYSTEMS : 09/02/2018  ALL BELOW ARE Negative except : SEE HPI     Constitutional: Negative for fever, chills and weight loss. Neg Weight Loss  Cardiovascular: Negative for chest pain, claudication and leg swelling. SOB, DOE   Gastrointestinal/Urological: Negative for  pain, N/V/D/C, Blood in stool or urine,dysuria                         Hematuria, Incontinence, pelvic pain  Musculoskeletal: see HPI.   Neurological: Negative for dizziness and weakness, headaches,Visual Changes             Confusion,  Or Seizures,   Psychiatric/Behavioral: Negative for depression, memory loss and substance abuse.   Extremities:  Negative for hair changes, rash or skin lesion changes.  Hematologic: Negative for Bleeding problems, bruising, pallor or swollen lymph nodes.  Peripheral Vascular: No calf pain,  vascular vein tenderness to calf pain              No calf throbbing, posterior knee throbbing pain     DIAGNOSTIC IMAGING        No notes on file    Please see above section of this report.    I have personally reviewed the results of the above study. The interpretation of this study is my professional opinion.    Written by Hoyt Koch, [employer], as dictated by Dr. Silvano Bilis. I, Dr. Silvano Bilis, confirm that all documentation is accurate.

## 2018-09-02 NOTE — Progress Notes (Signed)
AMBULATORY PROGRESS NOTE      Patient: Kyle Mueller             MRN: [id number]     SSN: [id number] Body mass index is 29 kg/m??.  Date of Birth: 1956-08-24     AGE: 62 y.o.       SEX: male    PCP: Cordelia Poche, NP     IMPRESSION/DIAGNOSIS AND TREATMENT PLAN     DIAGNOSES  1. Closed nondisplaced pilon fracture of left tibia with routine healing, subsequent encounter    2. Muscle spasms of both lower extremities        Orders Placed This Encounter   ??? [73610] Ankle Min 3V   ??? Ca-D3-mag-zinc-cop-mang-boron (CALTRATE 600-D PLUS MINERALS) 600 mg calcium- 800 unit-40 mg chew   ??? diclofenac EC (VOLTAREN) 75 mg EC tablet   ??? metaxalone (SKELAXIN) 800 mg tablet      Kyle Mueller understands his diagnoses and the proposed plan.   X-rays show:     1. Healed, medial malleolar vertical shear type fracture.   2. 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     He is doing very well at this point.  We will see him prn.  We did wish him well.      Plan:    1) Continue activity modification as directed.    2) Refill Voltaren 75 mg: 1 PO BID; 60 tablets, 2 refills.  3) Refill Caltrate 600 mg: 1 PO every day; 30 tablets, 2 refills.   3) Refill Skelaxin 800 mg: 1 PO BIDPRN; 60 tablets, 2 refills.      RTO - PRN     HPI AND EXAMINATION     Kyle Mueller IS A 62 y.o. male who presents to my outpatient office for follow up of a closed nondisplaced pilon fracture of the left tibia and plantar fasciitis of the right foot. At the last visit, I provided an order for Visco heel inserts, provided an HEP for plantar fascial stretches, instructed the patient to continue activity modification as directed, to continue using the CAM boot, and to  continue supplementing with calcium and vitamin D.     Since we saw him last, [name] states that he is doing well and denies any pain or soreness. He notes that he has been supplementing with Caltrate, which he believes is helping with his healing. He has also been taking Voltaren 75 mg once a day, which he states has also been helping with his pain. He inquires about whether he can receive refills on all of these medications. The patient notes that he has been modifying his activity, even while hunting. He states that he does not climb into deer stands or anything over 3 feet.     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 He is not currently taking blood thinners, other than ASA.     Visit Vitals  BP 141/82   Pulse 71   Temp 96.6 ??F (35.9 ??C) (Oral)   Resp 18   Ht 6' (1.829 m)   Wt 213 lb 12.8 oz (97 kg)   SpO2 94%   BMI 29.00  kg/m??     Appearance: Alert, well appearing and pleasant patient who is in no distress, oriented to person, place/time, and who follows commands.This patient is accompanied in the examination room by his wife. Dementia: no dementia  Psychiatric: Affect and mood are appropriate. Patient arrives to office via: without assistive device  HEENT: Head normocephalic & atraumatic. Both pupils are round, non icteric sclera              Eye: EOM are intact and sclera are clear               Neck: ROM WNL and JVD neck is not present              Hearings Intact, does not require hearing aid device  Respiratory: Breathing is unlabored without accessory chest muscle use  Cardiovascular/Peripheral Vascular: Normal Pulses to each foot    ANKLE/FOOT left  ??  Gait: normal  Tenderness: No tenderness to the posterior tibial tendon behind the medial malleolus.   Cutaneous: Minimal swelling over the medial ankle.   Joint Motion: Can DF to neutral with knee flexed and extended                          30 degrees of PF                          Limited ST motion                           Limited inversion.   Joint / Tendon Stability: No Ankle or Subtalar instability or joint laxity.                                             No peroneal sublux ability or dislocation  Alignment: Pes cavus alignment.   Neuro Motor/Sensory: NL/NL.  Vascular: NL foot/ankle pulses.  Lymphatics: No extremity lymphedema, No calf swelling, no tenderness to calf muscles.    CHART REVIEW     Past Medical History:   Diagnosis Date   ??? Asthma    ??? Diabetes (HCC)     CONTROLLED W/FOOD AND DIET   ??? 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 Erectile dysfunction    ??? Hypertension      Current Outpatient Medications   Medication Sig   ??? Ca-D3-mag-zinc-cop-mang-boron (CALTRATE 600-D PLUS MINERALS) 600 mg calcium- 800 unit-40 mg chew Take 600 mg by mouth daily.   ??? diclofenac EC (VOLTAREN) 75 mg EC tablet Take 1 Tab by mouth two (2) times daily (with meals).   ??? metaxalone (SKELAXIN) 800 mg tablet Take 1 Tab by mouth two (2) times daily as needed for Pain.   ??? calcium carbonate (CALCIUM 500) 500 mg calcium (1,250 mg) tablet Take 1 Tab by mouth daily.   ??? cholecalciferol (VITAMIN D3) 2,000 unit cap capsule Take 2,000 Units by mouth daily.   ??? HYDROcodone-acetaminophen (NORCO) 5-325 mg per tablet Take 1 Tab by mouth.   ??? diclofenac EC (VOLTAREN) 75 mg EC tablet TAKE 1 TABLET BY MOUTH 2 TIMES A DAY WITH MEALS   ??? aspirin (ASPIRIN) 325 mg tablet Take 325 mg by mouth.   ??? benzonatate (TESSALON) 200 mg capsule Take 200 mg by mouth.   ???  glucose blood VI test strips (ONETOUCH ULTRA BLUE TEST STRIP) strip TEST BLOOD GLUCOSE UP TO  QID   ??? cyclobenzaprine (FLEXERIL) 10 mg tablet Take 5-10 mg by mouth.   ??? insulin glargine-lixisenatide (SOLIQUA 100/33) 100 unit-33 mcg/mL inpn 22 Units by SubCUTAneous route.   ??? metoprolol succinate (TOPROL-XL) 25 mg XL tablet Take 25 mg by mouth.   ??? omeprazole (PRILOSEC) 40 mg capsule Take 40 mg by mouth.   ??? rosuvastatin (CRESTOR) 20 mg tablet Take 20 mg by mouth.   ??? tadalafil (CIALIS) 5 mg  tablet Take 5 mg by mouth daily as needed.   ??? vardenafil (LEVITRA) 20 mg tablet Take 20 mg by mouth daily as needed for 10 doses.     No current facility-administered medications for this visit.      Allergies   Allergen Reactions   ??? Augmentin [Amoxicillin-Pot Clavulanate] Anaphylaxis, Shortness of Breath and Palpitations     BP DROPS   ??? Codeine Other (comments)     COMBATIVE   ??? Dilaudid [Hydromorphone (Bulk)] Other (comments)     HALLUCINATIONS   ??? Percocet [Oxycodone-Acetaminophen] Nausea and Vomiting     Past Surgical History:   Procedure Laterality Date   ??? CARDIAC SURG PROCEDURE UNLIST  05/25/2016   ??? HX BACK SURGERY  1991    L-4 L-5 DISCECTOMY   ??? HX MOHS PROCEDURES  2009    OBICI   ??? HX ORTHOPAEDIC      BILAT ACHILLES TENDON REPAIR     Social History     Occupational History   ??? Not on file   Tobacco Use   ??? Smoking status: Former Smoker     Packs/day: 1.00     Years: 25.00     Pack years: 25.00     Last attempt to quit: 12/24/1996     Years since quitting: 21.7   ??? Smokeless tobacco: Never Used   Substance and Sexual Activity   ??? Alcohol use: No     Frequency: Never     Comment: ocassional ( did not answer)    ??? Drug use: No   ??? Sexual activity: Yes     Partners: Female     Comment: E.D.     Family History   Problem Relation Age of Onset   ??? Inflammatory Bowel Dz Mother    ??? Cancer Father         LUNG CA   ??? Kidney Disease Sister         RENAL FAILURE   ??? Diabetes Sister         REVIEW OF SYSTEMS : 09/02/2018  ALL BELOW ARE Negative except : SEE HPI     Constitutional: Negative for fever, chills and weight loss. Neg Weight Loss  Cardiovascular: Negative for chest pain, claudication and leg swelling. SOB, DOE   Gastrointestinal/Urological: Negative for  pain, N/V/D/C, Blood in stool or urine,dysuria                         Hematuria, Incontinence, pelvic pain  Musculoskeletal: see HPI.  Neurological: Negative for dizziness and weakness, headaches,Visual Changes             Confusion,  Or Seizures,    Psychiatric/Behavioral: Negative for depression, memory loss and substance abuse.   Extremities:  Negative for hair changes, rash or skin lesion changes.  Hematologic: Negative for Bleeding problems, bruising, pallor or swollen lymph nodes.  Peripheral Vascular: No calf pain,  vascular vein tenderness to calf pain              No calf throbbing, posterior knee throbbing pain     DIAGNOSTIC IMAGING        No notes on file    Please see above section of this report.    I have personally reviewed the results of the above study. The interpretation of this study is my professional opinion.    Written by Hoyt Koch, [employer], as dictated by Dr. Silvano Bilis. I, Dr. Silvano Bilis, confirm that all documentation is accurate.

## 2018-09-02 NOTE — Progress Notes (Signed)
1. Have you been to the ER, urgent care clinic since your last visit?  Hospitalized since your last visit?No    2. Have you seen or consulted any other health care providers outside of the [facility] since your last visit?  Include any pap smears or colon screening. No

## 2018-10-02 DIAGNOSIS — E663 Overweight: Secondary | ICD-10-CM | POA: Insufficient documentation

## 2018-10-15 ENCOUNTER — Ambulatory Visit: Attending: Foot and Ankle Surgery | Primary: Registered Nurse

## 2018-10-15 ENCOUNTER — Ambulatory Visit
Admit: 2018-10-15 | Discharge: 2018-10-15 | Payer: PRIVATE HEALTH INSURANCE | Attending: Foot and Ankle Surgery | Primary: Registered Nurse

## 2018-10-15 DIAGNOSIS — M7671 Peroneal tendinitis, right leg: Secondary | ICD-10-CM

## 2018-10-15 NOTE — Progress Notes (Signed)
1. Have you been to the ER, urgent care clinic since your last visit?  Hospitalized since your last visit?No    2. Have you seen or consulted any other health care providers outside of the [facility] since your last visit?  Include any pap smears or colon screening. No

## 2018-10-15 NOTE — Progress Notes (Signed)
AMBULATORY PROGRESS NOTE      Patient: Kyle Mueller             MRN: [id number]     SSN: [id number] Body mass index is 28.89 kg/m??.  Date of Birth: 02-25-1956     AGE: 62 y.o.       SEX: male    PCP: Cordelia Poche, NP     IMPRESSION/DIAGNOSIS AND TREATMENT PLAN     DIAGNOSES  1. Peroneal tendinitis of right lower extremity    2. Contusion of right foot, initial encounter        No orders of the defined types were placed in this encounter.     Kyle Mueller understands his diagnoses and the proposed plan.   So, my impression is, Kyle Mueller is a 62 year old male who stepped down from a booth at [facility] and felt pain on the lateral part of his foot.  He saw a podiatrist and had a radiologist look at x-rays as well, and the family tells me the radiologist said there was no acute fracture, but the podiatrist pointed and showed the patient he has a large fracture near his cuboid.      In looking at his x-rays on CD/DVD, three views of the right foot done on October 13, 2018, it shows what looks like a large ossicle or os vesalianum adjacent to the cuboid.  It looks rounded.  It looks sclerotic. No fresh and not jagged. Directly to the cuboid, there are some small bony ossicles, but I do not see any definite fracture in the body of the cuboid.  Clinically, he is tender to the cuboid and tender to the lateral border of his foot.      I want to protect him in a CAM walker boot, which he already has.  He can partial weightbear.  Otherwise, he will use crutches, and we will reassess him in three weeks.  We will get x-rays of his right foot upon next visit.  Clinically, he had normal peroneal tendon strength without weakness.  There is no bruising or ecchymosis to the plantar portion of his foot.  He is nontender to the first metatarsal base and the plantar portion of his foot is nontender.  His tenderness, again, is on the lateral calcaneal  wall adjacent to the peroneal tendons.  There is no bruising or ecchymosis at the peroneal tendons at the level of the fibula, distal fibula, or at the peroneal tendon sheath regions.       Plan:    1) Continue using the CAM walker boot as directed.   2) Continue supplementing with calcium and vitamin D.   3) Continue activity modification as directed.   4) Anticipate MRI if condition does not improve.     RTO - 3 weeks // PLEASE OBTAIN X-RAYS OF: right foot 3 VIEWS      HPI AND EXAMINATION     Kyle Mueller IS A 62 y.o. male who presents to my outpatient office for a problem visit of the right foot. [name] reports that last Thursday, he was sitting in a booth at [facility] and when he turned and went to stand to get out of the booth, he felt a pop and immediate pain in his right foot. He reports that he followed up with his PCP for this, who took x-rays and referred the patient to a podiatrist, who diagnosed him with a fracture. However, he notes that the radiologist said that  he does not have a fracture. He presents to the office today with a CAM walker boot, which was given to him by his podiatrist. He reports that he has been supplementing with vitamin D 2,000 units and caltrate. His wife reports that he does have h/o osteopenia. She notes that his PCP will be sending him to a rheumatologist (not endocrinologist) for treatment of his osteopenia. Date of injury: 10/09/2018.     The patient has h/o osteopenia.     Visit Vitals  BP 126/85   Pulse 85   Temp 96.5 ??F (35.8 ??C) (Oral)   Resp 18   Ht 6' (1.829 m)   Wt 213 lb (96.6 kg)   SpO2 98%   BMI 28.89 kg/m??     Appearance: Alert, well appearing and pleasant patient who is in no distress, oriented to person, place/time, and who follows commands.This patient is accompanied in the examination room by his wife. Dementia: no dementia  Psychiatric: Affect and mood are appropriate. Patient arrives to office via: with assistive device: CAM boot   HEENT: Head normocephalic & atraumatic. Both pupils are round, non icteric sclera   Eye: EOM are intact and sclera are clear    Neck: ROM WNL and JVD neck is not present     Hearings Intact, does not require hearing aid device  Respiratory: Breathing is unlabored without accessory chest muscle use  Cardiovascular/Peripheral Vascular: Normal Pulses to each foot    ANKLE/FOOT right    Gait: using CAM boot  Tenderness: Mild tenderness to the 5th metatarsal    Mild tenderness to the peroneal tendon.     Mild tenderness to the cuboid  Cutaneous: Mild swelling to the lateral foot    Slight ecchymosis along the lateral calcaneal wall  Joint Motion: Not tested at this time  Joint / Tendon Stability: No Ankle or Subtalar instability or joint laxity.                       No peroneal sublux ability or dislocation  Alignment: Forefoot, Midfoot, Hindfoot WNL.   Neuro Motor/Sensory: NL/NL.   Good peroneal tendon strength against resistance  Vascular: NL foot/ankle pulses.  Lymphatics: No extremity lymphedema, No calf swelling, no tenderness to calf muscles.    CHART REVIEW     Past Medical History:   Diagnosis Date   ??? Asthma    ??? Diabetes (HCC)     CONTROLLED W/FOOD AND DIET   ??? 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 Erectile dysfunction    ??? Hypertension      Current Outpatient Medications   Medication Sig   ??? Ca-D3-mag-zinc-cop-mang-boron (CALTRATE 600-D PLUS MINERALS) 600 mg calcium- 800 unit-40 mg chew Take 600 mg by mouth daily.   ??? diclofenac EC (VOLTAREN) 75 mg EC tablet Take 1 Tab by mouth two (2) times daily (with meals).   ??? metaxalone (SKELAXIN) 800 mg tablet Take 1 Tab by mouth two (2) times daily as needed for Pain.   ??? calcium carbonate (CALCIUM 500) 500 mg calcium (1,250 mg) tablet Take 1 Tab by mouth daily.   ??? cholecalciferol (VITAMIN D3) 2,000 unit cap capsule Take 2,000 Units by mouth daily.   ??? HYDROcodone-acetaminophen (NORCO) 5-325 mg per tablet Take 1 Tab by mouth.    ??? diclofenac EC (VOLTAREN) 75 mg EC tablet TAKE 1 TABLET BY MOUTH 2 TIMES A DAY WITH MEALS   ??? aspirin (ASPIRIN) 325 mg tablet Take 325  mg by mouth.   ??? benzonatate (TESSALON) 200 mg capsule Take 200 mg by mouth.   ??? glucose blood VI test strips (ONETOUCH ULTRA BLUE TEST STRIP) strip TEST BLOOD GLUCOSE UP TO  QID   ??? cyclobenzaprine (FLEXERIL) 10 mg tablet Take 5-10 mg by mouth.   ??? insulin glargine-lixisenatide (SOLIQUA 100/33) 100 unit-33 mcg/mL inpn 22 Units by SubCUTAneous route.   ??? metoprolol succinate (TOPROL-XL) 25 mg XL tablet Take 25 mg by mouth.   ??? omeprazole (PRILOSEC) 40 mg capsule Take 40 mg by mouth.   ??? rosuvastatin (CRESTOR) 20 mg tablet Take 20 mg by mouth.   ??? tadalafil (CIALIS) 5 mg tablet Take 5 mg by mouth daily as needed.   ??? vardenafil (LEVITRA) 20 mg tablet Take 20 mg by mouth daily as needed for 10 doses.     No current facility-administered medications for this visit.      Allergies   Allergen Reactions   ??? Augmentin [Amoxicillin-Pot Clavulanate] Anaphylaxis, Shortness of Breath and Palpitations     BP DROPS   ??? Codeine Other (comments)     COMBATIVE   ??? Dilaudid [Hydromorphone (Bulk)] Other (comments)     HALLUCINATIONS   ??? Percocet [Oxycodone-Acetaminophen] Nausea and Vomiting     Past Surgical History:   Procedure Laterality Date   ??? CARDIAC SURG PROCEDURE UNLIST  05/25/2016   ??? HX BACK SURGERY  1991    L-4 L-5 DISCECTOMY   ??? HX MOHS PROCEDURES  2009    [facility]   ??? HX ORTHOPAEDIC      BILAT ACHILLES TENDON REPAIR     Social History     Occupational History   ??? Not on file   Tobacco Use   ??? Smoking status: Former Smoker     Packs/day: 1.00     Years: 25.00     Pack years: 25.00     Last attempt to quit: 12/24/1996     Years since quitting: 21.8   ??? Smokeless tobacco: Never Used   Substance and Sexual Activity   ??? Alcohol use: No     Frequency: Never     Comment: ocassional ( did not answer)    ??? Drug use: No   ??? Sexual activity: Yes     Partners: Female     Comment: E.D.      Family History   Problem Relation Age of Onset   ??? Inflammatory Bowel Dz Mother    ??? Cancer Father         LUNG CA   ??? Kidney Disease Sister         RENAL FAILURE   ??? Diabetes Sister         REVIEW OF SYSTEMS : 10/15/2018  ALL BELOW ARE Negative except : SEE HPI     Constitutional: Negative for fever, chills and weight loss. Neg Weight Loss  Cardiovascular: Negative for chest pain, claudication and leg swelling. SOB, DOE   Gastrointestinal/Urological: Negative for  pain, N/V/D/C, Blood in stool or urine,dysuria                         Hematuria, Incontinence, pelvic pain  Musculoskeletal: see HPI.  Neurological: Negative for dizziness and weakness, headaches,Visual Changes             Confusion,  Or Seizures,   Psychiatric/Behavioral: Negative for depression, memory loss and substance abuse.   Extremities:  Negative for hair changes, rash or skin lesion changes.  Hematologic: Negative for Bleeding problems, bruising, pallor or swollen lymph nodes.  Peripheral Vascular: No calf pain, vascular vein tenderness to calf pain              No calf throbbing, posterior knee throbbing pain     DIAGNOSTIC IMAGING     No notes on file     Please see above section of this report.    I have personally reviewed the results of the above study. The interpretation of this study is my professional opinion.    Written by Hoyt Koch, Scribe[employer]ick, as dictated by Dr. Silvano Bilis. I, Dr. Silvano Bilis, confirm that all documentation is accurate.

## 2018-10-15 NOTE — Progress Notes (Signed)
AMBULATORY PROGRESS NOTE      Patient: Kyle Mueller             MRN: [id number]     SSN: [id number] Body mass index is 28.89 kg/m??.  Date of Birth: Aug 27, 1956     AGE: 62 y.o.       SEX: male    PCP: Cordelia Poche, NP     IMPRESSION/DIAGNOSIS AND TREATMENT PLAN     DIAGNOSES  1. Peroneal tendinitis of right lower extremity    2. Contusion of right foot, initial encounter        No orders of the defined types were placed in this encounter.     Kyle Mueller understands his diagnoses and the proposed plan.   So, my impression is, Kyle Mueller is a 62 year old male who stepped down from a booth at [facility] and felt pain on the lateral part of his foot.  He saw a podiatrist and had a radiologist look at x-rays as well, and the family tells me the radiologist said there was no acute fracture, but the podiatrist pointed and showed the patient he has a large fracture near his cuboid.      In looking at his x-rays on CD/DVD, three views of the right foot done on October 13, 2018, it shows what looks like a large ossicle or os vesalianum adjacent to the cuboid.  It looks rounded.  It looks sclerotic. No fresh and not jagged. Directly to the cuboid, there are some small bony ossicles, but I do not see any definite fracture in the body of the cuboid.  Clinically, he is tender to the cuboid and tender to the lateral border of his foot.      I want to protect him in a CAM walker boot, which he already has.  He can partial weightbear.  Otherwise, he will use crutches, and we will reassess him in three weeks.  We will get x-rays of his right foot upon next visit.  Clinically, he had normal peroneal tendon strength without weakness.  There is no bruising or ecchymosis to the plantar portion of his foot.  He is nontender to the first metatarsal base and the plantar portion of his foot is nontender.  His tenderness, again, is on the lateral calcaneal wall adjacent to the peroneal tendons.  There is no bruising or  ecchymosis at the peroneal tendons at the level of the fibula, distal fibula, or at the peroneal tendon sheath regions.       Plan:    1) Continue using the CAM walker boot as directed.   2) Continue supplementing with calcium and vitamin D.   3) Continue activity modification as directed.   4) Anticipate MRI if condition does not improve.     RTO - 3 weeks // PLEASE OBTAIN X-RAYS OF: right foot 3 VIEWS      HPI AND EXAMINATION     Kyle Mueller IS A 62 y.o. male who presents to my outpatient office for a problem visit of the right foot. Mr. Economou reports that last Thursday, he was sitting in a booth at [facility] and when he turned and went to stand to get out of the booth, he felt a pop and immediate pain in his right foot. He reports that he followed up with his PCP for this, who took x-rays and referred the patient to a podiatrist, who diagnosed him with a fracture. However, he notes that the radiologist said that  he does not have a fracture. He presents to the office today with a CAM walker boot, which was given to him by his podiatrist. He reports that he has been supplementing with vitamin D 2,000 units and caltrate. His wife reports that he does have h/o osteopenia. She notes that his PCP will be sending him to a rheumatologist (not endocrinologist) for treatment of his osteopenia. Date of injury: 10/09/2018.     The patient has h/o osteopenia.     Visit Vitals  BP 126/85   Pulse 85   Temp 96.5 ??F (35.8 ??C) (Oral)   Resp 18   Ht 6' (1.829 m)   Wt 213 lb (96.6 kg)   SpO2 98%   BMI 28.89 kg/m??     Appearance: Alert, well appearing and pleasant patient who is in no distress, oriented to person, place/time, and who follows commands.This patient is accompanied in the examination room by his wife. Dementia: no dementia  Psychiatric: Affect and mood are appropriate. Patient arrives to office via: with assistive device: CAM boot  HEENT: Head normocephalic & atraumatic. Both pupils are round, non icteric  sclera   Eye: EOM are intact and sclera are clear    Neck: ROM WNL and JVD neck is not present     Hearings Intact, does not require hearing aid device  Respiratory: Breathing is unlabored without accessory chest muscle use  Cardiovascular/Peripheral Vascular: Normal Pulses to each foot    ANKLE/FOOT right    Gait: using CAM boot  Tenderness: Mild tenderness to the 5th metatarsal    Mild tenderness to the peroneal tendon.     Mild tenderness to the cuboid  Cutaneous: Mild swelling to the lateral foot    Slight ecchymosis along the lateral calcaneal wall  Joint Motion: Not tested at this time  Joint / Tendon Stability: No Ankle or Subtalar instability or joint laxity.                       No peroneal sublux ability or dislocation  Alignment: Forefoot, Midfoot, Hindfoot WNL.   Neuro Motor/Sensory: NL/NL.   Good peroneal tendon strength against resistance  Vascular: NL foot/ankle pulses.  Lymphatics: No extremity lymphedema, No calf swelling, no tenderness to calf muscles.    CHART REVIEW     Past Medical History:   Diagnosis Date   ??? Asthma    ??? Diabetes (HCC)     CONTROLLED W/FOOD AND DIET   ??? 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 Erectile dysfunction    ??? Hypertension      Current Outpatient Medications   Medication Sig   ??? Ca-D3-mag-zinc-cop-mang-boron (CALTRATE 600-D PLUS MINERALS) 600 mg calcium- 800 unit-40 mg chew Take 600 mg by mouth daily.   ??? diclofenac EC (VOLTAREN) 75 mg EC tablet Take 1 Tab by mouth two (2) times daily (with meals).   ??? metaxalone (SKELAXIN) 800 mg tablet Take 1 Tab by mouth two (2) times daily as needed for Pain.   ??? calcium carbonate (CALCIUM 500) 500 mg calcium (1,250 mg) tablet Take 1 Tab by mouth daily.   ??? cholecalciferol (VITAMIN D3) 2,000 unit cap capsule Take 2,000 Units by mouth daily.   ??? HYDROcodone-acetaminophen (NORCO) 5-325 mg per tablet Take 1 Tab by mouth.   ??? diclofenac EC (VOLTAREN) 75 mg EC tablet TAKE 1 TABLET BY MOUTH 2 TIMES A DAY WITH MEALS   ???  aspirin (ASPIRIN) 325 mg tablet Take 325  mg by mouth.   ??? benzonatate (TESSALON) 200 mg capsule Take 200 mg by mouth.   ??? glucose blood VI test strips (ONETOUCH ULTRA BLUE TEST STRIP) strip TEST BLOOD GLUCOSE UP TO  QID   ??? cyclobenzaprine (FLEXERIL) 10 mg tablet Take 5-10 mg by mouth.   ??? insulin glargine-lixisenatide (SOLIQUA 100/33) 100 unit-33 mcg/mL inpn 22 Units by SubCUTAneous route.   ??? metoprolol succinate (TOPROL-XL) 25 mg XL tablet Take 25 mg by mouth.   ??? omeprazole (PRILOSEC) 40 mg capsule Take 40 mg by mouth.   ??? rosuvastatin (CRESTOR) 20 mg tablet Take 20 mg by mouth.   ??? tadalafil (CIALIS) 5 mg tablet Take 5 mg by mouth daily as needed.   ??? vardenafil (LEVITRA) 20 mg tablet Take 20 mg by mouth daily as needed for 10 doses.     No current facility-administered medications for this visit.      Allergies   Allergen Reactions   ??? Augmentin [Amoxicillin-Pot Clavulanate] Anaphylaxis, Shortness of Breath and Palpitations     BP DROPS   ??? Codeine Other (comments)     COMBATIVE   ??? Dilaudid [Hydromorphone (Bulk)] Other (comments)     HALLUCINATIONS   ??? Percocet [Oxycodone-Acetaminophen] Nausea and Vomiting     Past Surgical History:   Procedure Laterality Date   ??? CARDIAC SURG PROCEDURE UNLIST  05/25/2016   ??? HX BACK SURGERY  1991    L-4 L-5 DISCECTOMY   ??? HX MOHS PROCEDURES  2009    [facility]   ??? HX ORTHOPAEDIC      BILAT ACHILLES TENDON REPAIR     Social History     Occupational History   ??? Not on file   Tobacco Use   ??? Smoking status: Former Smoker     Packs/day: 1.00     Years: 25.00     Pack years: 25.00     Last attempt to quit: 12/24/1996     Years since quitting: 21.8   ??? Smokeless tobacco: Never Used   Substance and Sexual Activity   ??? Alcohol use: No     Frequency: Never     Comment: ocassional ( did not answer)    ??? Drug use: No   ??? Sexual activity: Yes     Partners: Female     Comment: E.D.     Family History   Problem Relation Age of Onset   ??? Inflammatory Bowel Dz Mother    ??? Cancer Father          LUNG CA   ??? Kidney Disease Sister         RENAL FAILURE   ??? Diabetes Sister         REVIEW OF SYSTEMS : 10/15/2018  ALL BELOW ARE Negative except : SEE HPI     Constitutional: Negative for fever, chills and weight loss. Neg Weight Loss  Cardiovascular: Negative for chest pain, claudication and leg swelling. SOB, DOE   Gastrointestinal/Urological: Negative for  pain, N/V/D/C, Blood in stool or urine,dysuria                         Hematuria, Incontinence, pelvic pain  Musculoskeletal: see HPI.  Neurological: Negative for dizziness and weakness, headaches,Visual Changes             Confusion,  Or Seizures,   Psychiatric/Behavioral: Negative for depression, memory loss and substance abuse.   Extremities:  Negative for hair changes, rash or skin lesion changes.  Hematologic: Negative for Bleeding problems, bruising, pallor or swollen lymph nodes.  Peripheral Vascular: No calf pain, vascular vein tenderness to calf pain              No calf throbbing, posterior knee throbbing pain     DIAGNOSTIC IMAGING     No notes on file     Please see above section of this report.    I have personally reviewed the results of the above study. The interpretation of this study is my professional opinion.    Written by Hoyt Koch, Scribe[employer]ick, as dictated by Dr. Silvano Bilis. I, Dr. Silvano Bilis, confirm that all documentation is accurate.

## 2018-10-15 NOTE — Progress Notes (Signed)
1. Have you been to the ER, urgent care clinic since your last visit?  Hospitalized since your last visit?No    2. Have you seen or consulted any other health care providers outside of the [facility] since your last visit?  Include any pap smears or colon screening. No

## 2018-10-21 ENCOUNTER — Telehealth

## 2018-10-21 NOTE — Telephone Encounter (Signed)
The patient's wife, Rosey Bath (on [employer]IP[employer]) called stating that the patient is wanting to have an MRI of his foot. She is wondering if this order can be placed and to call the patient to schedule at [phone]

## 2018-10-21 NOTE — Telephone Encounter (Signed)
We can order, but insurance may not cover until patient has had 6 weeks of conservative treatement.        Luna Fuse, PA-C  10/21/2018  4:32 PM

## 2018-10-22 NOTE — Telephone Encounter (Signed)
MRI right ankle ordered. Scheduler will contact patient to schedule an appointment.

## 2018-11-03 ENCOUNTER — Inpatient Hospital Stay: Admit: 2018-11-03 | Payer: PRIVATE HEALTH INSURANCE | Attending: Foot and Ankle Surgery | Primary: Registered Nurse

## 2018-11-03 DIAGNOSIS — S9031XA Contusion of right foot, initial encounter: Secondary | ICD-10-CM

## 2018-11-05 ENCOUNTER — Encounter: Attending: Foot and Ankle Surgery | Primary: Registered Nurse

## 2018-11-06 DIAGNOSIS — R29810 Facial weakness: Secondary | ICD-10-CM | POA: Insufficient documentation

## 2018-11-06 DIAGNOSIS — R4781 Slurred speech: Secondary | ICD-10-CM | POA: Insufficient documentation

## 2018-11-06 DIAGNOSIS — I10 Essential (primary) hypertension: Secondary | ICD-10-CM | POA: Insufficient documentation

## 2018-11-06 DIAGNOSIS — Z951 Presence of aortocoronary bypass graft: Secondary | ICD-10-CM | POA: Insufficient documentation

## 2018-11-06 DIAGNOSIS — G459 Transient cerebral ischemic attack, unspecified: Secondary | ICD-10-CM

## 2018-11-06 DIAGNOSIS — I2510 Atherosclerotic heart disease of native coronary artery without angina pectoris: Secondary | ICD-10-CM | POA: Insufficient documentation

## 2018-11-06 DIAGNOSIS — M8580 Other specified disorders of bone density and structure, unspecified site: Secondary | ICD-10-CM | POA: Insufficient documentation

## 2018-11-06 DIAGNOSIS — R079 Chest pain, unspecified: Secondary | ICD-10-CM | POA: Insufficient documentation

## 2018-11-06 DIAGNOSIS — E785 Hyperlipidemia, unspecified: Secondary | ICD-10-CM | POA: Insufficient documentation

## 2018-11-06 DIAGNOSIS — S82899A Other fracture of unspecified lower leg, initial encounter for closed fracture: Secondary | ICD-10-CM | POA: Insufficient documentation

## 2018-11-06 HISTORY — DX: Facial weakness: R29.810

## 2018-11-06 HISTORY — DX: Slurred speech: R47.81

## 2018-11-06 HISTORY — DX: Transient cerebral ischemic attack, unspecified: G45.9

## 2018-11-11 ENCOUNTER — Ambulatory Visit
Admit: 2018-11-11 | Discharge: 2018-11-11 | Payer: PRIVATE HEALTH INSURANCE | Attending: Foot and Ankle Surgery | Primary: Registered Nurse

## 2018-11-11 ENCOUNTER — Ambulatory Visit: Attending: Foot and Ankle Surgery | Primary: Registered Nurse

## 2018-11-11 DIAGNOSIS — M7671 Peroneal tendinitis, right leg: Secondary | ICD-10-CM

## 2018-11-11 NOTE — Progress Notes (Signed)
1. Have you been to the ER, urgent care clinic since your last visit?  Hospitalized since your last visit?Yes When: thursday Where: [facility] Reason for visit: heart check    2. Have you seen or consulted any other health care providers outside of the [facility] since your last visit?  Include any pap smears or colon screening. No

## 2018-11-11 NOTE — Progress Notes (Signed)
Verbal order read back given by Dr. Caines to sign orders for DME placed by Jennifer Hess

## 2018-11-11 NOTE — Progress Notes (Signed)
AMBULATORY PROGRESS NOTE      Patient: Kyle Mueller             MRN: [id number]     SSN: [id number] Body mass index is 28.21 kg/m??.  Date of Birth: [birth year]     AGE: 62 y.o.       SEX: male    PCP: Cordelia Poche, NP     IMPRESSION/DIAGNOSIS AND TREATMENT PLAN     DIAGNOSES  1. Peroneal tendinitis of right lower extremity    2. Contusion of right foot, subsequent encounter    3. Closed nondisplaced fracture of cuboid of right foot, initial encounter        Orders Placed This Encounter   ??? AMB SUPPLY ORDER   ??? AMB SUPPLY ORDER   ??? [73630] Foot Min 3V   ??? PR CLOSED TX TARSAL FX,EACH      Kyle Mueller understands his diagnoses and the proposed plan.   I did review the MRI with him and his wife.  It does show some stress changes at the cuboid.  He needs to offload his cuboid.  Additional plans are listed as below.       Netta Corrigan, MD, has ordered a custom brace or DME product for you. This will be customized and made for you by an outside facility. I am requesting that you contact the Orthotist company provided below in order to have the prescription made.       Kyle Mueller is in need of CUSTOM   Bilateral     1. Bilateral: TRILAMINAR ORTHOTIC,  LATERAL POSTED Bilateral    2. GOAL OF TREATMENT TO: to provide M/L stability, optimal arch support, and limit ML/AP motion.     Kyle Mueller needs tri planar control (Medial / Lateral stability, optimal arch support, and limited Medial/Lateral // Anterior/Posterior Motion) of the foot and ankle in order to improve anatomical alignment, protect/control motion, and to relieve pain.       Plan:    1) DME Order: Custom bilateral trilaminar orthotic insert with lateral posting, goal to offload lateral column and cuboid (CC).  2) DME Order: Extra depth shoes (CC).   3) Continue activity modification as directed.      RTO - 6 weeks     HPI AND EXAMINATION     Kyle Mueller IS A 62 y.o. male who presents to my outpatient office for  follow up of peroneal tendinitis of the RLE and a contusion of the right foot. At the last visit, I instructed the patient to continue activity modification as directed, to continue supplementing with calcium and vitamin D, and to anticipate MRI if condition does not improve. Date of injury: 10/09/2018.     Since we saw him last, Kyle Mueller states that he is still experiencing pain in his right lateral ankle. MRI results have been reviewed with the patient. He notes that he has had custom shoes made at [facility] in the past.     The patient has h/o osteopenia.     Visit Vitals  BP 130/78   Pulse 81   Temp 95.7 ??F (35.4 ??C) (Oral)   Resp 18   Ht 6' (1.829 m)   Wt 208 lb (94.3 kg)   SpO2 94%   BMI 28.21 kg/m??     Appearance: Alert, well appearing and pleasant patient who is in no distress, oriented to person, place/time, and who follows commands.This patient is accompanied in  the examination room by his wife. Dementia: no dementia  Psychiatric: Affect and mood are appropriate. Patient arrives to office via: with assistive device: CAM boot  HEENT: Head normocephalic & atraumatic. Both pupils are round, non icteric sclera   Eye: EOM are intact and sclera are clear    Neck: ROM WNL and JVD neck is not present     Hearings Intact, does not require hearing aid device  Respiratory: Breathing is unlabored without accessory chest muscle use  Cardiovascular/Peripheral Vascular: Normal Pulses to each foot    ANKLE/FOOT right    Gait: using CAM boot  Tenderness: Mild tenderness to the 5th metatarsal    Mild tenderness to the peroneal tendon.     Mild tenderness to the cuboid  Cutaneous: Mild swelling to the lateral foot    Slight ecchymosis along the lateral calcaneal wall  Joint Motion: Not tested at this time  Joint / Tendon Stability: No Ankle or Subtalar instability or joint laxity.                       No peroneal sublux ability or dislocation  Alignment: Forefoot, Midfoot, Hindfoot WNL.    Neuro Motor/Sensory: NL/NL.   Good peroneal tendon strength against resistance  Vascular: NL foot/ankle pulses.  Lymphatics: No extremity lymphedema, No calf swelling, no tenderness to calf muscles.    CHART REVIEW     Past Medical History:   Diagnosis Date   ??? Asthma    ??? Diabetes (HCC)     CONTROLLED W/FOOD AND DIET   ??? 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 Erectile dysfunction    ??? Hypertension      Current Outpatient Medications   Medication Sig   ??? Ca-D3-mag-zinc-cop-mang-boron (CALTRATE 600-D PLUS MINERALS) 600 mg calcium- 800 unit-40 mg chew Take 600 mg by mouth daily.   ??? diclofenac EC (VOLTAREN) 75 mg EC tablet Take 1 Tab by mouth two (2) times daily (with meals).   ??? metaxalone (SKELAXIN) 800 mg tablet Take 1 Tab by mouth two (2) times daily as needed for Pain.   ??? calcium carbonate (CALCIUM 500) 500 mg calcium (1,250 mg) tablet Take 1 Tab by mouth daily.   ??? cholecalciferol (VITAMIN D3) 2,000 unit cap capsule Take 2,000 Units by mouth daily.   ??? HYDROcodone-acetaminophen (NORCO) 5-325 mg per tablet Take 1 Tab by mouth.   ??? diclofenac EC (VOLTAREN) 75 mg EC tablet TAKE 1 TABLET BY MOUTH 2 TIMES A DAY WITH MEALS   ??? aspirin (ASPIRIN) 325 mg tablet Take 325 mg by mouth.   ??? benzonatate (TESSALON) 200 mg capsule Take 200 mg by mouth.   ??? glucose blood VI test strips (ONETOUCH ULTRA BLUE TEST STRIP) strip TEST BLOOD GLUCOSE UP TO  QID   ??? cyclobenzaprine (FLEXERIL) 10 mg tablet Take 5-10 mg by mouth.   ??? insulin glargine-lixisenatide (SOLIQUA 100/33) 100 unit-33 mcg/mL inpn 22 Units by SubCUTAneous route.   ??? metoprolol succinate (TOPROL-XL) 25 mg XL tablet Take 25 mg by mouth.   ??? omeprazole (PRILOSEC) 40 mg capsule Take 40 mg by mouth.   ??? rosuvastatin (CRESTOR) 20 mg tablet Take 20 mg by mouth.   ??? tadalafil (CIALIS) 5 mg tablet Take 5 mg by mouth daily as needed.   ??? vardenafil (LEVITRA) 20 mg tablet Take 20 mg by mouth daily as needed for 10 doses.      No current facility-administered medications for this visit.  Allergies   Allergen Reactions   ??? Augmentin [Amoxicillin-Pot Clavulanate] Anaphylaxis, Shortness of Breath and Palpitations     BP DROPS   ??? Codeine Other (comments)     COMBATIVE   ??? Dilaudid [Hydromorphone (Bulk)] Other (comments)     HALLUCINATIONS   ??? Percocet [Oxycodone-Acetaminophen] Nausea and Vomiting     Past Surgical History:   Procedure Laterality Date   ??? CARDIAC SURG PROCEDURE UNLIST  05/25/2016   ??? HX BACK SURGERY  1991    L-4 L-5 DISCECTOMY   ??? HX MOHS PROCEDURES  2009    OBICI   ??? HX ORTHOPAEDIC      BILAT ACHILLES TENDON REPAIR     Social History     Occupational History   ??? Not on file   Tobacco Use   ??? Smoking status: Former Smoker     Packs/day: 1.00     Years: 25.00     Pack years: 25.00     Last attempt to quit: 12/24/1996     Years since quitting: 21.8   ??? Smokeless tobacco: Never Used   Substance and Sexual Activity   ??? Alcohol use: No     Frequency: Never     Comment: ocassional ( did not answer)    ??? Drug use: No   ??? Sexual activity: Yes     Partners: Female     Comment: E.D.     Family History   Problem Relation Age of Onset   ??? Inflammatory Bowel Dz Mother    ??? Cancer Father         LUNG CA   ??? Kidney Disease Sister         RENAL FAILURE   ??? Diabetes Sister         REVIEW OF SYSTEMS : 11/11/2018  ALL BELOW ARE Negative except : SEE HPI     Constitutional: Negative for fever, chills and weight loss. Neg Weight Loss  Cardiovascular: Negative for chest pain, claudication and leg swelling. SOB, DOE   Gastrointestinal/Urological: Negative for  pain, N/V/D/C, Blood in stool or urine,dysuria                         Hematuria, Incontinence, pelvic pain  Musculoskeletal: see HPI.  Neurological: Negative for dizziness and weakness, headaches,Visual Changes             Confusion,  Or Seizures,   Psychiatric/Behavioral: Negative for depression, memory loss and substance abuse.    Extremities:  Negative for hair changes, rash or skin lesion changes.  Hematologic: Negative for Bleeding problems, bruising, pallor or swollen lymph nodes.  Peripheral Vascular: No calf pain, vascular vein tenderness to calf pain              No calf throbbing, posterior knee throbbing pain     DIAGNOSTIC IMAGING     No notes on file     MRI Results (most recent):  Results from Hospital Encounter encounter on 11/03/18   MRI ANKLE RT WO CONT    Narrative Multisequence multiplanar MR images of the right ankle were obtained.    HISTORY: Lateral foot pain.    Intact repaired Achilles tendon  No significant inflammation. Mild plantar  fasciitis. Calcaneus is intact. No fractures or fracture. No ankle joint  effusion. Talar dome remains intact. Sinus tarsi is unremarkable.    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 There is moderate peroneus longus  tendinosis through the cuboid tunnel. No rupture. Peroneus brevis is intact.  Tibiofibular ligaments are intact. Talofibular ligaments are intact.  Calcaneofibular ligament is also intact.    Posterior tibialis and flexor tendons are intact. Deltoid ligament is intact.    Lisfranc ligament is intact. Extensor tendons unremarkable at the ankle level.      Impression IMPRESSION:  1. 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    2. Mild plantar fasciitis.            Please see above section of this report.    I have personally reviewed the results of the above study. The interpretation of this study is my professional opinion.    Written by Hoyt KochJennifer Hesse, ScribeKick, as dictated by Dr. Silvano BilisMichael Sourish Allender. I, Dr. Silvano BilisMichael Naszir Cott, confirm that all documentation is accurate.

## 2018-11-11 NOTE — Patient Instructions (Signed)
You have been provided with an order for durable medical equipment that you may pick up at an outside facility as our office does not carry the equipment you need. You may pick it up at any medical supply company you like. Listed below are a few different locations for your convenience:        [facility]  [address], [address]  [place], [place] [address]  Phone: [phone]    [facility]  Phone: [phone]  [place]:   [address]  [place], [place] [address]  [place]:  [phone] [address]  [place], [place] [address]  [place]:  [address]   [place], [place] [address]    [facility]  [address] [place], [place] [address]  Phone: [phone]         Tendon Injury (Tendinopathy): Care Instructions  Your Care Instructions    Tendons are tough, flexible tissues that connect muscle to bone. A tendon can hurt or get torn from overuse or aging, especially tendons in the shoulder, elbow, wrist, hip, knee, or ankle. Tendon injuries may be called tendinopathy or tendinitis. Tendon injuries can occur from any motion you have to repeat in a job, sports, or daily activities. Tennis elbow is one common tendon injury.  You can treat most tendon problems with over-the-counter pain medicine, rest, changes in your activities, and physical therapy.  Follow-up care is a key part of your treatment and safety. Be sure to make and go to all appointments, and call your doctor if you are having problems. It's also a good idea to know your test results and keep a list of the medicines you take.  How can you care for yourself at home?  ?? Rest the sore area. You may have to stop doing the activity that caused the tendon pain for a while.  ?? Take an over-the-counter pain medicine, such as acetaminophen (Tylenol), ibuprofen (Advil, Motrin), or naproxen (Aleve). Read and follow all instructions on the label.   ?? Do not take two or more pain medicines at the same time unless the doctor told you to. Many pain medicines have acetaminophen, which is Tylenol. Too much acetaminophen (Tylenol) can be harmful.  ?? Put ice or a cold pack on the sore area for 10 to 20 minutes at a time. Try to do this every 1 to 2 hours for the next 3 days (when you are awake) or until any swelling goes down. Put a thin cloth between the ice and your skin.  ?? Prop up the sore area on a pillow when you ice it or anytime you sit or lie down during the next 3 days. Try to keep it above the level of your heart. This will help reduce swelling.  ?? Follow your doctor's advice for wearing and caring for a sling, splint, or cast. In some cases, you may wear one of these for a while to help your tendon heal.  ?? Follow your doctor's advice for stretching and physical therapy. Gently move your joint through its full range of motion. This will prevent stiffness in your joint.  ?? Go back to your activity slowly. Warm up before and stretch after the activity. You also can try making some changes. For example, if a sport caused your tendon pain, alternate the sport with another activity. If using a tool causes pain, switch hands or change your grip.  Stop the activity if it hurts. After the activity, apply ice to prevent pain and swelling.  ?? Do not smoke. Smoking can slow healing. If you need help quitting, talk to your doctor about stop-smoking programs and medicines. These can increase your chances of quitting for good.  When should you call for help?  Watch closely for changes in your health, and be sure to contact your doctor if:  ?? ?? Your pain gets worse.   ?? ?? You do not get better as expected.   Where can you learn more?  Go to [employer].  Enter A157 in the search box to learn more about "Tendon Injury (Tendinopathy): Care Instructions."  Current as of: June 18, 2018  Content Version: 12.2   ?? 2006-2019 [employer]. Care instructions adapted under license by [employer] (which disclaims liability or warranty for this information). If you have questions about a medical condition or this instruction, always ask your healthcare professional. [employer] disclaims any warranty or liability for your use of this information.

## 2018-11-11 NOTE — Progress Notes (Addendum)
Progress Notes by Kyle Corrigan, MD at 11/11/18 0845                Author: Netta Corrigan, MD  Service: --  Author Type: Physician       Filed: 11/12/18 1829  Encounter Date: 11/11/2018  Status: Signed          Editor: Kyle Corrigan, MD (Physician)          Related Notes: Original Note by Kyle Corrigan, MD (Physician) filed at 11/11/18 1213               AMBULATORY PROGRESS NOTE         Patient: Kyle Mueller              MRN: [id number]     SSN:  [id number] Body mass index is 28.21 kg/m??.   Date of Birth: [birth year]     AGE:  62 y.o.       SEX: male      PCP: Kyle Poche, NP         IMPRESSION/DIAGNOSIS AND TREATMENT PLAN        DIAGNOSES      1.  Peroneal tendinitis of right lower extremity      2.  Contusion of right foot, subsequent encounter         3.  Closed nondisplaced fracture of cuboid of right foot, initial encounter              Orders Placed This Encounter        ?  AMB SUPPLY ORDER     ?  AMB SUPPLY ORDER     ?  [[id number]] Foot Min 3V        ?  PR CLOSED TX TARSAL FX,EACH         Kyle Mueller understands his  diagnoses and the proposed plan.    I did review the MRI with him and his wife.  It does show some stress changes at the cuboid.  He needs to offload his cuboid.  Additional plans are listed as below.          Kyle Corrigan, MD, has ordered a custom brace or DME product for you. This will be customized and made for you by an outside facility. I  am requesting that you contact the Orthotist company provided below in order to have the prescription made.          Kyle Mueller is in need of CUSTOM   Bilateral       1.  Bilateral: TRILAMINAR ORTHOTIC,  LATERAL POSTED  Bilateral      2. GOAL OF TREATMENT TO: to provide M/L stability, optimal arch support, and limit ML/AP motion.       Kyle Mueller needs tri planar control (Medial / Lateral stability, optimal arch support, and limited Medial/Lateral // Anterior/Posterior Motion) of the foot and ankle in order  to improve anatomical  alignment, protect/control motion, and to relieve pain.          Plan:      1) DME Order: Custom bilateral trilaminar orthotic insert with lateral posting, goal to offload lateral column and cuboid ([facility]).   2) DME Order: Extra depth shoes ([facility]).    3) Continue activity modification as directed.        RTO - 6 weeks         HPI AND EXAMINATION  Kyle Mueller IS A 62 y.o.  male who presents to my outpatient office for follow up of peroneal tendinitis of the RLE and a contusion of the right foot. At the last visit, I instructed the patient to continue activity modification  as directed, to continue supplementing with calcium and vitamin D, and to anticipate MRI if condition does not improve. Date of injury: 10/09/2018.       Since we saw him last, [name] states that he is still experiencing pain in his right  lateral ankle. MRI results have been reviewed with the patient. He notes that he has had custom shoes made at [facility] in the past.       The patient has h/o osteopenia.       Visit Vitals      BP  130/78     Pulse  81     Temp  95.7 ??F (35.4 ??C) (Oral)     Resp  18     Ht  6' (1.829 m)     Wt  208 lb (94.3 kg)     SpO2  94%        BMI  28.21 kg/m??        Appearance: Alert, well appearing and pleasant patient who is in no distress, oriented to person, place/time, and who follows commands.This  patient is accompanied in the examination room by his wife . Dementia: no dementia   Psychiatric: Affect and mood are appropriate. Patient arrives to office via: with assistive device: CAM boot   HEENT: Head normocephalic & atraumatic. Both pupils are round, non icteric sclera    Eye: EOM are intact and sclera are clear     Neck: ROM WNL and JVD neck is not present      Hearings Intact, does not require hearing aid device   Respiratory: Breathing is unlabored without accessory chest muscle use   Cardiovascular/Peripheral Vascular: Normal Pulses to each foot      ANKLE/FOOT right       Gait: using CAM boot   Tenderness: Mild tenderness to the 5th metatarsal     Mild tenderness to the peroneal tendon.      Mild tenderness to the cuboid   Cutaneous: Mild swelling to the lateral foot     Slight ecchymosis along the lateral calcaneal wall   Joint Motion: Not tested at this time   Joint / Tendon Stability: No Ankle or Subtalar instability or joint laxity.                        No peroneal sublux ability or dislocation   Alignment: Forefoot, Midfoot, Hindfoot WNL.    Neuro Motor/Sensory: NL/NL.    Good peroneal tendon strength against resistance   Vascular: NL foot/ankle pulses.   Lymphatics: No extremity lymphedema, No calf swelling, no tenderness to calf muscles.        CHART REVIEW          Past Medical History:        Diagnosis  Date         ?  Asthma       ?  Diabetes (HCC)            CONTROLLED W/FOOD AND DIET         ?  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  Erectile dysfunction           ?  Hypertension            Current Outpatient Medications        Medication  Sig         ?  Ca-D3-mag-zinc-cop-mang-boron (CALTRATE 600-D PLUS MINERALS) 600 mg calcium- 800 unit-40 mg chew  Take 600 mg by mouth daily.     ?  diclofenac EC (VOLTAREN) 75 mg EC tablet  Take 1 Tab by mouth two (2) times daily (with meals).     ?  metaxalone (SKELAXIN) 800 mg tablet  Take 1 Tab by mouth two (2) times daily as needed for Pain.     ?  calcium carbonate (CALCIUM 500) 500 mg calcium (1,250 mg) tablet  Take 1 Tab by mouth daily.     ?  cholecalciferol (VITAMIN D3) 2,000 unit cap capsule  Take 2,000 Units by mouth daily.     ?  HYDROcodone-acetaminophen (NORCO) 5-325 mg per tablet  Take 1 Tab by mouth.     ?  diclofenac EC (VOLTAREN) 75 mg EC tablet  TAKE 1 TABLET BY MOUTH 2 TIMES A DAY WITH MEALS     ?  aspirin (ASPIRIN) 325 mg tablet  Take 325 mg by mouth.     ?  benzonatate (TESSALON) 200 mg capsule  Take 200 mg by mouth.     ?  glucose blood VI test strips (ONETOUCH ULTRA BLUE TEST STRIP) strip   TEST BLOOD GLUCOSE UP TO  QID     ?  cyclobenzaprine (FLEXERIL) 10 mg tablet  Take 5-10 mg by mouth.     ?  insulin glargine-lixisenatide (SOLIQUA 100/33) 100 unit-33 mcg/mL inpn  22 Units by SubCUTAneous route.     ?  metoprolol succinate (TOPROL-XL) 25 mg XL tablet  Take 25 mg by mouth.     ?  omeprazole (PRILOSEC) 40 mg capsule  Take 40 mg by mouth.     ?  rosuvastatin (CRESTOR) 20 mg tablet  Take 20 mg by mouth.     ?  tadalafil (CIALIS) 5 mg tablet  Take 5 mg by mouth daily as needed.         ?  vardenafil (LEVITRA) 20 mg tablet  Take 20 mg by mouth daily as needed for 10 doses.          No current facility-administered medications for this visit.           Allergies        Allergen  Reactions         ?  Augmentin [Amoxicillin-Pot Clavulanate]  Anaphylaxis, Shortness of Breath and Palpitations             BP DROPS         ?  Codeine  Other (comments)             COMBATIVE         ?  Dilaudid [Hydromorphone (Bulk)]  Other (comments)             HALLUCINATIONS         ?  Percocet [Oxycodone-Acetaminophen]  Nausea and Vomiting          Past Surgical History:         Procedure  Laterality  Date          ?  CARDIAC SURG PROCEDURE UNLIST    05/25/2016     ?  HX BACK SURGERY    1991          L-4 L-5 DISCECTOMY          ?  HX MOHS PROCEDURES    2009          OBICI          ?  HX ORTHOPAEDIC              BILAT ACHILLES TENDON REPAIR          Social History          Occupational History        ?  Not on file       Tobacco Use         ?  Smoking status:  Former Smoker              Packs/day:  1.00         Years:  25.00         Pack years:  25.00         Last attempt to quit:  12/24/1996         Years since quitting:  21.8         ?  Smokeless tobacco:  Never Used       Substance and Sexual Activity         ?  Alcohol use:  No              Frequency:  Never             Comment: ocassional ( did not answer)          ?  Drug use:  No     ?  Sexual activity:  Yes              Partners:  Female             Comment: E.D.           Family History         Problem  Relation  Age of Onset          ?  Inflammatory Bowel Dz  Mother       ?  Cancer  Father                LUNG CA          ?  Kidney Disease  Sister                RENAL FAILURE          ?  Diabetes  Sister                REVIEW OF SYSTEMS : 11/11/2018   ALL BELOW ARE Negative except : SEE HPI        Constitutional: Negative for fever, chills and weight loss. Neg Weight Loss   Cardiovascular: Negative for chest pain, claudication and leg swelling. SOB, DOE    Gastrointestinal/Urological: Negative for  pain, N/V/D/C, Blood in stool or urine,dysuria                         Hematuria, Incontinence, pelvic pain   Musculoskeletal: see HPI.   Neurological: Negative for dizziness and weakness, headaches,Visual Changes             Confusion,  Or Seizures,    Psychiatric/Behavioral: Negative for depression, memory loss and substance abuse.    Extremities:  Negative for hair changes, rash or skin lesion changes.   Hematologic: Negative for Bleeding problems, bruising, pallor or swollen lymph nodes.   Peripheral Vascular: No calf pain, vascular vein tenderness to calf  pain               No calf throbbing, posterior knee throbbing pain         DIAGNOSTIC IMAGING        No notes on file       MRI Results (most recent):     Results from Hospital Encounter encounter on 11/03/18     MRI ANKLE RT WO CONT           Narrative  Multisequence multiplanar MR images of the right ankle were obtained.      HISTORY: Lateral foot pain.      Intact repaired Achilles tendon  No significant inflammation. Mild plantar   fasciitis. Calcaneus is intact. No fractures or fracture. No ankle joint   effusion. Talar dome remains intact. Sinus tarsi is unremarkable.      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 There is moderate peroneus longus   tendinosis through the cuboid tunnel. No rupture. Peroneus brevis is intact.   Tibiofibular  ligaments are intact. Talofibular ligaments are intact.   Calcaneofibular ligament is also intact.      Posterior tibialis and flexor tendons are intact. Deltoid ligament is intact.      Lisfranc ligament is intact. Extensor tendons unremarkable at the ankle level.              Impression  IMPRESSION:   1. 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 Peroneus longus tendinosis through the cuboid tunnel with   no focal tear or rupture.      2. Mild plantar fasciitis.                      Please see above section of this report.      I have personally reviewed the results of the above study. The interpretation of this study is my professional opinion.      Written by Hoyt Koch, ScribeKick, as dictated by Dr. Silvano Bilis. I, Dr. Silvano Bilis, confirm that all documentation is accurate.

## 2018-11-11 NOTE — Progress Notes (Signed)
1. Have you been to the ER, urgent care clinic since your last visit?  Hospitalized since your last visit?Yes When: thursday Where: [facility] Reason for visit: heart check    2. Have you seen or consulted any other health care providers outside of the [facility] since your last visit?  Include any pap smears or colon screening. No

## 2018-11-26 DIAGNOSIS — D329 Benign neoplasm of meninges, unspecified: Secondary | ICD-10-CM | POA: Insufficient documentation

## 2018-12-23 ENCOUNTER — Encounter: Attending: Foot and Ankle Surgery | Primary: Registered Nurse

## 2019-01-22 NOTE — Telephone Encounter (Signed)
Patient called stating he needs a visit with a spine physician asap.  I explained the 1st available is 02/17 at 2:30 with NLP, but he was hoping for today.  I said I could get a note to see if there is anywhere sooner he could be fit in.  He is requesting a physician, not a NP as well.  I explained all I could do was try.  Please advise.  [phone] patient

## 2019-01-22 NOTE — Telephone Encounter (Signed)
Can call daily for cancellations with Mariam Dollar , Wilford Corner or Tana Coast.  Looks like he may be a new patient

## 2019-02-04 ENCOUNTER — Ambulatory Visit: Attending: Physical Medicine & Rehabilitation | Primary: Registered Nurse

## 2019-02-04 ENCOUNTER — Ambulatory Visit
Admit: 2019-02-04 | Discharge: 2019-02-04 | Payer: PRIVATE HEALTH INSURANCE | Attending: Physical Medicine & Rehabilitation | Primary: Registered Nurse

## 2019-02-04 DIAGNOSIS — G8912 Acute post-thoracotomy pain: Secondary | ICD-10-CM

## 2019-02-04 MED ORDER — DIAZEPAM 10 MG TAB
10 mg | ORAL_TABLET | ORAL | 0 refills | Status: AC
Start: 2019-02-04 — End: ?

## 2019-02-04 NOTE — Progress Notes (Signed)
[facility]  [address]  [place], [place] [address]  Phone: [phone]  Fax: [phone]        INITIAL CONSULTATION      HISTORY OF PRESENT ILLNESS:  Kyle Mueller is a 63 y.o. male whom is referred from Cordelia Poche, NP secondary to a recurrence of acute, progressive right-sided thoracic spine pain x 2 years. He rates his pain 7 -9/10. He describes his pain primarily at the right lateral thoracic spine over the posterior ribs roughly at T10. Records indicate this pain has been present intermittently over the last 10 years. His pain at this time is constant, and exacerbated with rotation.  Patient denies previous thoracic spinal surgery. He had thoracic spinal injections by Dr. Oneida Arenas without relief. Previously, he completed PT with relief, but is not compliant with his HEP. Pt denies fever, weight loss, or skin changes. Pt denies change in bowel or bladder habits. He previously tolerated NEURONTIN w/o relief, and is currently taking Topamax x 4 weeks. PmHx of recently Dx left-sided brain tumor, open-heart surgery, coronary artery disease, and uncontrolled DM. He is followed by Dr. Delrae Sawyers and Dr. Thedore Mins, neurology. Previously, he was followed by Dr. Buford Dresser for pain management. Patient is a nonsmoker. LHD. Patient was last seen by me on 12/03/2008. My notes indicate at the time he had c/o chronic low back pain into the RLE. He had previously undergone spinal surgery in 1991 by Dr. Hulan Fess. Additionally, he endorsed right-sided thoracic pain. We discussed setting him up for a MRI, patient noted he was claustrophobic. I referred him to PT and he failed to follow up. Note from Dr. Asa Saunas dated 09/27/2016 indicating patient had a bilateral RFA L4-5 and L5-S1, and indicated he had right-sided rib pain. Note from Dr. Oneida Arenas dated 10/04/16 indicating patient was seen with c/o neck and low back pain without relief from  lidocaine. Note from Dr. Oneida Arenas dated 11/02/16 indicating patient was underwent a T12-L1 block with relief from back pain, but no relief from the side pain. He underwent a trigger point injection without relief. Note from Dr. Oneida Arenas dated 11/20/16 indicating patient was scheduled for a right-sided intercostal block; however, the previous right-sided rib pain had largely resolved. Note from Dr. Oneida Arenas dated 12/31/16 indicating patient was seen with c/o pain that was largely myofascial. He was treated with lidocane and Neurontin without relief. He completed PT with relief and nortriptyline with some relief. Treated with a tense unit. Indicated patient had intercostal myalgia. Note from Dr. Burt Knack dated 11/11/18 indicating patient was seen for peroneal tendonitis RLE. Referral note from [place], [place] dated 01/21/2019 indicating patient was seen with c/o low back pain x 3 weeks and acute right-sided chest pain. Referred to spine. T Spine XR dated 01/30/2019 reviewed. Per report, no significant abnormality. Chest XR dated 01/21/2019, revealed per report: no active cardiopulmonary disease. DEXA Bone Denisty Study dated 06/27/2018 revealed: BMD measures consistent with osteopenia/low bone density. Chest CT dated 03/08/2018, revealed per report: no evidence of pulmonary embolism or other acute process. C Spine MRI dated 10/29/16, revealed per report: mild spinal canal narrowing at C6-C7 Mild spondylosis of the remaining cervical spine without significant stenosis. L Spine MRI dated 11/04/15, revealed per report:  no significant interval change in the caliber of the central canal or neural foramina when compared to the prior exam. Mild formainal stenosis at L3-L4 and L4-L5. Left hemilaminectomy changes at L4-5. T Spine MRI dated 09/03/14, revealed per report:  mild central canal  stenosis and moderate bilateral foraminal stenosis at T10-11. Elsewhere,  no significant central canal or foraminal stenosis in the thoracic spine. Degenerative disc changes. T Spine CT dated 06/27/10, revealed per report: no suspicious bony abnormalities. There degenerative changes of the midthoracic spine.       The patient is LHD. PMP reviewed. Body mass index is 28.78 kg/m??.    PCP: Cordelia PocheJackson, Pharnethia G, NP    Past Medical History:   Diagnosis Date   ??? Asthma    ??? Diabetes (HCC)     CONTROLLED W/FOOD AND DIET   ??? 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 Erectile dysfunction    ??? Hypertension           Past Surgical History:   Procedure Laterality Date   ??? CARDIAC SURG PROCEDURE UNLIST  05/25/2016   ??? HX BACK SURGERY  1991    L-4 L-5 DISCECTOMY   ??? HX MOHS PROCEDURES  2009    OBICI   ??? HX ORTHOPAEDIC      BILAT ACHILLES TENDON REPAIR         Social History     Tobacco Use   ??? Smoking status: Former Smoker     Packs/day: 1.00     Years: 25.00     Pack years: 25.00     Last attempt to quit: 12/24/1996     Years since quitting: 22.1   ??? Smokeless tobacco: Never Used   Substance Use Topics   ??? Alcohol use: No     Frequency: Never     Comment: ocassional ( did not answer)        Work status: The patient is employed.  Marital status: married.     Current Outpatient Medications   Medication Sig Dispense Refill   ??? metFORMIN ER (GLUCOPHAGE XR) 500 mg tablet Take 1,000 mg by mouth.     ??? TRULICITY 1.5 mg/0.5 mL sub-q pen      ??? topiramate (TOPAMAX) 50 mg tablet      ??? diclofenac EC (VOLTAREN) 75 mg EC tablet Take 1 Tab by mouth two (2) times daily (with meals). 60 Tab 2   ??? metaxalone (SKELAXIN) 800 mg tablet Take 1 Tab by mouth two (2) times daily as needed for Pain. 60 Tab 2   ??? calcium carbonate (CALCIUM 500) 500 mg calcium (1,250 mg) tablet Take 1 Tab by mouth daily. 90 Tab 0   ??? cholecalciferol (VITAMIN D3) 2,000 unit cap capsule Take 2,000 Units by mouth daily. 90 Cap 0   ??? aspirin (ASPIRIN) 325 mg tablet Take 81 mg by mouth.      ??? glucose blood VI test strips (ONETOUCH ULTRA BLUE TEST STRIP) strip TEST BLOOD GLUCOSE UP TO  QID     ??? insulin glargine-lixisenatide (SOLIQUA 100/33) 100 unit-33 mcg/mL inpn 22 Units by SubCUTAneous route.     ??? metoprolol succinate (TOPROL-XL) 25 mg XL tablet Take 25 mg by mouth.     ??? omeprazole (PRILOSEC) 40 mg capsule Take 40 mg by mouth.     ??? Ca-D3-mag-zinc-cop-mang-boron (CALTRATE 600-D PLUS MINERALS) 600 mg calcium- 800 unit-40 mg chew Take 600 mg by mouth daily. 30 Tab 2   ??? HYDROcodone-acetaminophen (NORCO) 5-325 mg per tablet Take 1 Tab by mouth.     ??? benzonatate (TESSALON) 200 mg capsule Take 200 mg by mouth.     ??? cyclobenzaprine (FLEXERIL) 10 mg tablet Take 5-10 mg by mouth.     ??? rosuvastatin (  CRESTOR) 20 mg tablet Take 20 mg by mouth.     ??? tadalafil (CIALIS) 5 mg tablet Take 5 mg by mouth daily as needed. 30 Tab 5   ??? vardenafil (LEVITRA) 20 mg tablet Take 20 mg by mouth daily as needed for 10 doses. 10 Tab 5       Allergies   Allergen Reactions   ??? Augmentin [Amoxicillin-Pot Clavulanate] Anaphylaxis, Shortness of Breath and Palpitations     BP DROPS   ??? Codeine Other (comments)     COMBATIVE   ??? Dilaudid [Hydromorphone (Bulk)] Other (comments)     HALLUCINATIONS   ??? Percocet [Oxycodone-Acetaminophen] Nausea and Vomiting            Family History   Problem Relation Age of Onset   ??? Inflammatory Bowel Dz Mother    ??? Cancer Father         LUNG CA   ??? Kidney Disease Sister         RENAL FAILURE   ??? Diabetes Sister          REVIEW OF SYSTEMS  Constitutional symptoms: Negative  Eyes: Negative  Ears, Nose, Throat, and Mouth: Negative  Cardiovascular: Negative  Respiratory: Negative  Genitourinary: Negative  Integumentary (Skin and/or breast): Negative  Musculoskeletal: Positive for thoracic spine pain  Extremities: Negative for edema.  Endocrine/Rheumatologic: Negative  Hematologic/Lymphatic: Negative  Allergic/Immunologic: Negative  Psychiatric: Negative       PHYSICAL EXAMINATION  Visit Vitals   BP 122/72 (BP 1 Location: Left arm, BP Patient Position: Sitting)   Pulse 89   Resp 18   Ht 6' (1.829 m)   Wt 212 lb 3.2 oz (96.3 kg)   BMI 28.78 kg/m??       CONSTITUTIONAL: NAD, A&O x 3  HEART: Regular rate and rhythm  ABDOMEN: Positive bowel sounds, soft, nontender, and nondistended  LUNGS: Clear to auscultation bilaterally.  SKIN: Negative for rash.  RANGE OF MOTION: The patient has full passive range of motion in all four extremities.  SENSATION: Sensation is intact to light touch throughout.  MOTOR:   Straight Leg Raise: Negative, bilateral  Hoffman: Negative, bilateral  Deep tendon reflexes are 0 at the biceps, triceps, and brachioradialis bilaterally.  Deep tendon reflexes are 0 at the knees and ankles bilaterally.    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    Shoulder AB/Flex Elbow Flex Wrist Ext Elbow Ext Wrist Flex Hand Intrin Tone   Right +4/5 +4/5 +4/5 +4/5 +4/5 +4/5 +4/5   Left +4/5 +4/5 +4/5 +4/5 +4/5 +4/5 +4/5              Hip Flex Knee Ext Knee Flex Ankle DF GTE Ankle PF Tone   Right +4/5 +4/5 +4/5 +4/5 +4/5 +4/5 +4/5   Left +4/5 +4/5 +4/5 +4/5 +4/5 +4/5 +4/5     ASSESSMENT   Diagnoses and all orders for this visit:    1. Acute post-thoracotomy pain  -     MRI [facility] SPINE W WO CONT; Future    2. Thoracic neuritis  -     MRI [facility] SPINE W WO CONT; Future       IMPRESSIONS/RECOMMENDATIONS:  Patient presents today with c/o a recurrence of acute, progressive right-sided thoracic spine pain x 2 years. Multiple treatment options were discussed. Patient is not a candidate for MDP due to uncontrolled DM. I will set him up for an open T Spine MRI. Patient is neurologically intact.I will see the patient back following  the MRI or earlier if needed.      Written by Remus BlakeSarah Griffith, ScribeKick, as dictated by Laney PastorNickolas Sharnika Binney, MD  I examined the patient, reviewed and agree with the note.

## 2019-02-04 NOTE — Progress Notes (Signed)
Progress Notes by Andria Meuse, MD at 02/04/19 0800                Author: Andria Meuse, MD  Service: --  Author Type: Physician       Filed: 02/04/19 1007  Encounter Date: 02/04/2019  Status: Signed          Editor: Andria Meuse, MD (Physician)                     [facility]  [address]  [place], [place] [address]  Phone: [phone]  Fax: [phone]            INITIAL CONSULTATION         HISTORY OF PRESENT ILLNESS:   Kyle Mueller is a 63 y.o.  male whom is referred from Cordelia Poche, NP secondary to a recurrence of acute,  progressive right-sided thoracic spine pain x 2 years. He rates his  pain 7 -9/10. He describes his pain primarily at the right lateral thoracic spine over the posterior ribs roughly at T10. Records indicate this  pain has been present intermittently over the last 10 years. His pain at this time is constant, and exacerbated with rotation.  Patient denies previous thoracic spinal surgery. He had thoracic spinal injections by Dr. Oneida Arenas without relief. Previously,  he completed PT with relief, but is not compliant with his HEP. Pt denies fever, weight loss, or skin changes. Pt denies change in bowel or bladder habits. He previously tolerated NEURONTIN  w/o relief, and is currently taking Topamax x 4 weeks. PmHx of recently Dx left-sided brain tumor, open-heart surgery, coronary artery disease, and uncontrolled DM.  He is followed by Dr. Delrae Sawyers and Dr. Thedore Mins, neurology. Previously, he was followed by Dr. Buford Dresser for pain management. Patient is a nonsmoker. LHD. Patient was last seen by me on 12/03/2008. My notes indicate at the time he had c/o chronic low back  pain into the RLE. He had previously undergone spinal surgery in 1991 by Dr. Hulan Fess. Additionally, he endorsed right-sided thoracic pain. We discussed setting him up for a MRI, patient noted he was claustrophobic. I referred him to PT and he failed to   follow up. Note from Dr. Asa Saunas dated 09/27/2016 indicating patient had a bilateral RFA L4-5 and L5-S1, and indicated he had right-sided rib pain. Note from Dr. Oneida Arenas dated 10/04/16 indicating patient was seen with c/o neck and low back pain  without relief from lidocaine. Note from Dr. Oneida Arenas dated 11/02/16 indicating patient was underwent a T12-L1 block with relief from back pain, but no relief from the side pain. He underwent a trigger point injection without relief. Note from Dr. Oneida Arenas  dated 11/20/16 indicating patient was scheduled for a right-sided intercostal block; however, the previous right-sided rib pain had largely resolved. Note from Dr. Oneida Arenas dated 12/31/16 indicating patient was seen with c/o pain that was largely myofascial.  He was treated with lidocane and Neurontin without relief. He completed PT with relief and nortriptyline with some relief. Treated with a tense unit. Indicated patient had intercostal myalgia. Note from Dr. Burt Knack dated 11/11/18 indicating patient was  seen for peroneal tendonitis RLE. Referral note from [place], [place] dated 01/21/2019 indicating patient was seen with c/o low back pain x 3 weeks and acute right-sided chest pain. Referred to spine.  T Spine XR dated 01/30/2019 reviewed. Per report, no significant abnormality. Chest XR dated  01/21/2019, revealed per report:  no active cardiopulmonary disease. DEXA Bone Denisty Study dated 06/27/2018 revealed: BMD measures consistent  with osteopenia/low bone density. Chest CT dated 03/08/2018, revealed per report: no evidence of pulmonary embolism or other acute process.  C Spine MRI dated 10/29/16, revealed per report: mild spinal canal narrowing at C6-C7 Mild spondylosis of the remaining cervical spine without significant stenosis.  L Spine MRI dated 11/04/15, revealed per report:  no significant interval change in the caliber of the central canal or neural foramina when compared to the prior exam. Mild  formainal stenosis at L3-L4  and L4-L5. Left hemilaminectomy changes at L4-5. T Spine MRI dated 09/03/14, revealed per report:  mild central canal stenosis and moderate  bilateral foraminal stenosis at T10-11. Elsewhere, no significant central canal or foraminal stenosis in the thoracic spine. Degenerative disc changes. T Spine CT  dated 06/27/10, revealed per report: no suspicious bony abnormalities. There degenerative changes of the midthoracic spine.          The patient is LHD. PMP reviewed. Body mass index is 28.78 kg/m??.      PCP: Cordelia PocheJackson, Pharnethia G, NP      Past Medical History:      Diagnosis  Date      ?  Asthma        ?  Diabetes (HCC)          CONTROLLED W/FOOD AND DIET      ?  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  Erectile dysfunction        ?  Hypertension                  Past Surgical History:      Procedure  Laterality  Date      ?  CARDIAC SURG PROCEDURE UNLIST    05/25/2016      ?  HX BACK SURGERY    1991        L-4 L-5 DISCECTOMY      ?  HX MOHS PROCEDURES    2009        OBICI      ?  HX ORTHOPAEDIC            BILAT ACHILLES TENDON REPAIR               Social History          Tobacco Use         ?  Smoking status:  Former Smoker              Packs/day:  1.00         Years:  25.00         Pack years:  25.00         Last attempt to quit:  12/24/1996         Years since quitting:  22.1         ?  Smokeless tobacco:  Never Used       Substance Use Topics         ?  Alcohol use:  No              Frequency:  Never             Comment: ocassional ( did not answer)           Work status: The patient is employed.  Marital status: married.         Current Outpatient Medications  Medication  Sig  Dispense  Refill           ?  metFORMIN ER (GLUCOPHAGE XR) 500 mg tablet  Take 1,000 mg by mouth.         ?  TRULICITY 1.5 mg/0.5 mL sub-q pen           ?  topiramate (TOPAMAX) 50 mg tablet           ?  diclofenac EC (VOLTAREN) 75 mg EC tablet  Take 1 Tab by mouth two (2) times daily  (with meals).  60 Tab  2     ?  metaxalone (SKELAXIN) 800 mg tablet  Take 1 Tab by mouth two (2) times daily as needed for Pain.  60 Tab  2     ?  calcium carbonate (CALCIUM 500) 500 mg calcium (1,250 mg) tablet  Take 1 Tab by mouth daily.  90 Tab  0     ?  cholecalciferol (VITAMIN D3) 2,000 unit cap capsule  Take 2,000 Units by mouth daily.  90 Cap  0     ?  aspirin (ASPIRIN) 325 mg tablet  Take 81 mg by mouth.         ?  glucose blood VI test strips (ONETOUCH ULTRA BLUE TEST STRIP) strip  TEST BLOOD GLUCOSE UP TO  QID         ?  insulin glargine-lixisenatide (SOLIQUA 100/33) 100 unit-33 mcg/mL inpn  22 Units by SubCUTAneous route.         ?  metoprolol succinate (TOPROL-XL) 25 mg XL tablet  Take 25 mg by mouth.         ?  omeprazole (PRILOSEC) 40 mg capsule  Take 40 mg by mouth.         ?  Ca-D3-mag-zinc-cop-mang-boron (CALTRATE 600-D PLUS MINERALS) 600 mg calcium- 800 unit-40 mg chew  Take 600 mg by mouth daily.  30 Tab  2     ?  HYDROcodone-acetaminophen (NORCO) 5-325 mg per tablet  Take 1 Tab by mouth.         ?  benzonatate (TESSALON) 200 mg capsule  Take 200 mg by mouth.         ?  cyclobenzaprine (FLEXERIL) 10 mg tablet  Take 5-10 mg by mouth.         ?  rosuvastatin (CRESTOR) 20 mg tablet  Take 20 mg by mouth.         ?  tadalafil (CIALIS) 5 mg tablet  Take 5 mg by mouth daily as needed.  30 Tab  5           ?  vardenafil (LEVITRA) 20 mg tablet  Take 20 mg by mouth daily as needed for 10 doses.  10 Tab  5             Allergies        Allergen  Reactions         ?  Augmentin [Amoxicillin-Pot Clavulanate]  Anaphylaxis, Shortness of Breath and Palpitations             BP DROPS         ?  Codeine  Other (comments)             COMBATIVE         ?  Dilaudid [Hydromorphone (Bulk)]  Other (comments)             HALLUCINATIONS         ?  Percocet [Oxycodone-Acetaminophen]  Nausea and Vomiting                  Family History         Problem  Relation  Age of Onset          ?  Inflammatory Bowel Dz  Mother       ?   Cancer  Father                LUNG CA          ?  Kidney Disease  Sister                RENAL FAILURE          ?  Diabetes  Sister                REVIEW OF SYSTEMS  Constitutional symptoms: Negative  Eyes: Negative  Ears, Nose, Throat, and Mouth: Negative  Cardiovascular:  Negative  Respiratory: Negative  Genitourinary: Negative  Integumentary (Skin and/or breast): Negative  Musculoskeletal: Positive for thoracic spine pain   Extremities: Negative for edema.  Endocrine/Rheumatologic: Negative  Hematologic/Lymphatic: Negative  Allergic/Immunologic: Negative  Psychiatric: Negative          PHYSICAL EXAMINATION  Visit Vitals      BP  122/72 (BP 1 Location: Left arm, BP Patient Position: Sitting)     Pulse  89     Resp  18     Ht  6' (1.829 m)     Wt  212 lb 3.2 oz (96.3 kg)        BMI  28.78 kg/m??          CONSTITUTIONAL: NAD, A&O x 3  HEART: Regular rate and rhythm   ABDOMEN: Positive bowel sounds, soft, nontender, and nondistended  LUNGS: Clear to  auscultation bilaterally.   SKIN: Negative for rash.   RANGE OF MOTION: The patient has full passive range of motion in all four extremities.   SENSATION: Sensation is intact to light touch throughout.  MOTOR:    Straight Leg Raise: Negative, bilateral   Hoffman: Negative, bilateral   Deep tendon reflexes are 0 at the biceps, triceps, and brachioradialis bilaterally.   Deep tendon reflexes are 0 at the knees and ankles bilaterally.      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               Shoulder AB/Flex  Elbow Flex  Wrist Ext  Elbow Ext  Wrist Flex  Hand Intrin  Tone              Right  +4/5  +4/5  +4/5  +4/5  +4/5  +4/5  +4/5              Left  +4/5  +4/5  +4/5  +4/5  +4/5  +4/5  +4/5                                            Hip Flex  Knee Ext  Knee Flex  Ankle DF  GTE  Ankle PF  Tone              Right  +4/5  +4/5  +4/5  +4/5  +4/5  +4/5  +4/5              Left  +4/5  +  4/5  +4/5  +4/5  +4/5  +4/5  +4/5        ASSESSMENT   Diagnoses and all orders for this visit:       1. Acute post-thoracotomy pain   -     [facility]      2. [facility]acic neuritis   -     [facility]          IMPRESSIONS/RECOMMENDATIONS:   Patient presents today with c/o a recurrence of acute, progressive right-sided thoracic spine pain x 2 years. Multiple treatment options were discussed. Patient is not a candidate for MDP due to uncontrolled  DM. I will set him up for an open T Spine MRI. Patient is neurologically intact.I will see the patient back following the MRI or earlier if needed.         Written by Remus BlakeSarah Griffith, ScribeKick, as dictated by Laney PastorNickolas Monterrius Cardosa, MD   I examined the patient, reviewed and agree with the note.

## 2019-02-09 ENCOUNTER — Encounter: Attending: Physical Medicine & Rehabilitation | Primary: Registered Nurse

## 2019-02-12 ENCOUNTER — Inpatient Hospital Stay
Admit: 2019-02-12 | Payer: PRIVATE HEALTH INSURANCE | Attending: Physical Medicine & Rehabilitation | Primary: Registered Nurse

## 2019-02-12 DIAGNOSIS — M5414 Radiculopathy, thoracic region: Secondary | ICD-10-CM

## 2019-02-12 LAB — AMB POC CREATININE
Creatinine, POC: 0.7 MG/DL (ref 0.6–1.3)
GFR African American: >60 mL/min/{1.73_m2} (ref >60)
GFR Non-African American: >60 mL/min/{1.73_m2} (ref >60)

## 2019-02-12 LAB — CREATININE, POC
Creatinine, POC: 0.7 MG/DL (ref 0.6–1.3)
GFRAA, POC: >60 mL/min/{1.73_m2} (ref >60)
GFRNA, POC: >60 mL/min/{1.73_m2} (ref >60)

## 2019-02-12 MED ORDER — GADOTERATE MEGLUMINE 0.5 MMOL/ML IV SYRINGE
0.5 mmol/mL | Freq: Once | INTRAVENOUS | Status: AC
Start: 2019-02-12 — End: 2019-02-12
  Administered 2019-02-12: 14:00:00 via INTRAVENOUS

## 2019-02-12 MED FILL — DOTAREM 0.5 MMOL/ML INTRAVENOUS SYRINGE: 0.5 mmol/mL | INTRAVENOUS | Qty: 15

## 2019-03-10 ENCOUNTER — Telehealth

## 2019-03-10 MED ORDER — CHOLECALCIFEROL (VITAMIN D3) 2,000 UNIT CAPSULE
ORAL_CAPSULE | Freq: Every day | ORAL | 0 refills | Status: AC
Start: 2019-03-10 — End: ?

## 2019-03-10 MED ORDER — DICLOFENAC 75 MG TAB, DELAYED RELEASE
75 mg | ORAL_TABLET | Freq: Two times a day (BID) | ORAL | 2 refills | Status: AC
Start: 2019-03-10 — End: ?

## 2019-03-10 MED ORDER — CALCIUM CARBONATE 500 MG (1250 MG) TAB
500 mg calcium (1,250 mg) | ORAL_TABLET | Freq: Every day | ORAL | 0 refills | Status: AC
Start: 2019-03-10 — End: ?

## 2019-03-10 NOTE — Telephone Encounter (Signed)
Patients wife made aware that rx's were sent to the pharmacy

## 2019-03-10 NOTE — Telephone Encounter (Signed)
Patient wife Kyle Mueller (on hipaa ) called and is asking for refills on Voltaren 75 mg medication, Calcium 500 mg, and Vitamin D3 from Dr. Burt Knack.    Patient wife said that on the Voltaren 75 mg medication , the patient would like a 90 days supply instead of 30 day supply; because it is cheaper getting a 90 day supply.    [name] OP RS [facility] on [address].  Tel. [phone].    [name] can be reached at   [facility]. [phone].

## 2019-03-10 NOTE — Telephone Encounter (Signed)
Prescription for the following medication e-prescribed to the patients pharmacy:    Orders Placed This Encounter   ??? cholecalciferol (Vitamin D3) (2,000 UNITS /50 MCG) cap capsule     Sig: Take 2,000 Units by mouth daily.     Dispense:  90 Cap     Refill:  0   ??? diclofenac EC (VOLTAREN) 75 mg EC tablet     Sig: Take 1 Tab by mouth two (2) times daily (with meals).     Dispense:  60 Tab     Refill:  2   ??? calcium carbonate (Calcium 500) 500 mg calcium (1,250 mg) tablet     Sig: Take 1 Tab by mouth daily.     Dispense:  90 Tab     Refill:  0           Luna Fuse, PA-C  03/10/2019  1:48 PM

## 2019-03-11 ENCOUNTER — Ambulatory Visit: Attending: Physical Medicine & Rehabilitation | Primary: Registered Nurse

## 2019-03-11 ENCOUNTER — Ambulatory Visit
Admit: 2019-03-11 | Discharge: 2019-03-11 | Payer: PRIVATE HEALTH INSURANCE | Attending: Physical Medicine & Rehabilitation | Primary: Registered Nurse

## 2019-03-11 DIAGNOSIS — M5104 Intervertebral disc disorders with myelopathy, thoracic region: Secondary | ICD-10-CM

## 2019-03-11 NOTE — Progress Notes (Signed)
[facility]  [address]  [place], [place] [address]  Phone: [phone]  Fax: [phone]        PROGRESS NOTE      HISTORY OF PRESENT ILLNESS:  The patient is a 63 y.o. male and was seen today for follow up of recurrence of acute, progressive right-sided thoracic spine pain x 2 years. He describes his pain primarily at the right lateral thoracic spine over the posterior ribs roughly at T10. Records indicate this pain has been present intermittently over the last 10 years. His pain at this time is constant, and exacerbated with rotation.  Patient denies previous thoracic spinal surgery. He had thoracic spinal injections by Dr. Oneida Arenas without relief. Previously, he completed PT with relief, but is not compliant with his HEP. Pt denies fever, weight loss, or skin changes. Pt denies change in bowel or bladder habits. He previously tolerated NEURONTIN w/o relief. PmHx of recently Dx left-sided brain tumor, open-heart surgery, coronary artery disease, and uncontrolled DM. He is followed by Dr. Delrae Sawyers and Dr. Thedore Mins, neurology. Previously, was followed by Dr. Buford Dresser for pain management. Patient is a nonsmoker. The patient is LHD. Patient was last seen by me on 12/03/2008. My notes indicate at the time he had c/o chronic low back pain into the RLE. He had previously undergone spinal surgery in 1991 by Dr. Hulan Fess. Additionally, he endorsed right-sided thoracic pain. We discussed setting him up for a MRI, patient noted he was claustrophobic. I referred him to PT and he failed to follow up. Note from Dr. Asa Saunas dated 09/27/2016 indicating patient had a bilateral RFA L4-5 and L5-S1, and indicated he had right-sided rib pain. Note from Dr. Oneida Arenas dated 10/04/16 indicating patient was seen with c/o neck and low back pain without relief from lidocaine. Note from Dr. Oneida Arenas dated 11/02/16 indicating patient was  underwent a T12-L1 block with relief from back pain, but no relief from the side pain. He underwent a trigger point injection without relief. Note from Dr. Oneida Arenas dated 11/20/16 indicating patient was scheduled for a right-sided intercostal block; however, the previous right-sided rib pain had largely resolved. Note from Dr. Oneida Arenas dated 12/31/16 indicating patient was seen with c/o pain that was largely myofascial. He was treated with lidocane and Neurontin without relief. He completed PT with relief and nortriptyline with some relief. Treated with a tense unit. Indicated patient had intercostal myalgia. Note from Dr. Burt Knack dated 11/11/18 indicating patient was seen for peroneal tendonitis RLE. Referral note from [place], [place] dated 01/21/2019 indicating patient was seen with c/o low back pain x 3 weeks and acute right-sided chest pain. Referred to spine. T Spine XR dated 01/30/2019 reviewed. Per report, no significant abnormality. Chest XR dated 01/21/2019, revealed per report: no active cardiopulmonary disease. DEXA Bone Denisty Study dated 06/27/2018 revealed: BMD measures consistent with osteopenia/low bone density. Chest CT dated 03/08/2018, revealed per report: no evidence of pulmonary embolism or other acute process. C Spine MRI dated 10/29/16, revealed per report: mild spinal canal narrowing at C6-C7 Mild spondylosis of the remaining cervical spine without significant stenosis. L Spine MRI dated 11/04/15, revealed per report:  no significant interval change in the caliber of the central canal or neural foramina when compared to the prior exam. Mild formainal stenosis at L3-L4 and L4-L5. Left hemilaminectomy changes at L4-5. T Spine MRI dated 09/03/14, revealed per report:  mild central canal stenosis and moderate bilateral foraminal stenosis at T10-11. Elsewhere, no significant central canal or foraminal stenosis  in the thoracic spine.  Degenerative disc changes. T Spine CT dated 06/27/10, revealed per report: no suspicious bony abnormalities. There degenerative changes of the midthoracic spine. At his last clinical appointment,  patient was not a candidate for MDP due to uncontrolled DM. I set him up for an open T Spine MRI.   ??  The patient returns today with pain radiating roughly at the T8 level bilaterally (R>L). He rates his pain 0-10/10, previously 7-9/10. Pt reports he recently d/c TOPAMAX secondary to intolerance and is now on Neurontin 600 mg BID as prescribed by Dr. Jason Coop office. My notes indicate he previously failed Neurontin. Pt reports Dx of a left-sided meningioma. Pt denies h/o seizures. Patient reports recently changing his DM medications and indicates his blood sugars are consistently remaining below 200. T Spine MRI dated 02/12/2019 films reviewed. Per report, mildly motion degraded examination. Focal flattening of the dorsal thoracic cord with ventral displacement of the cord at T5 level and subtle increased cord signal at caudal T4 level, overall concerning for ventral cord herniation but differential also includes dorsal arachnoid cyst. Further evaluation with thoracic CT myelogram is recommended.   ??    PMP reviewed. Body mass index is 29.48 kg/m??.    PCP: Cordelia Poche, NP      Past Medical History:   Diagnosis Date   ??? Asthma    ??? Diabetes (HCC)     CONTROLLED W/FOOD AND DIET   ??? 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 Erectile dysfunction    ??? Hypertension         Social History     Socioeconomic History   ??? Marital status: MARRIED     Spouse name: Not on file   ??? Number of children: Not on file   ??? Years of education: Not on file   ??? Highest education level: Not on file   Occupational History   ??? Not on file   Social Needs   ??? Financial resource strain: Not on file   ??? Food insecurity     Worry: Not on file     Inability: Not on file   ??? Transportation needs     Medical: Not on file      Non-medical: Not on file   Tobacco Use   ??? Smoking status: Former Smoker     Packs/day: 1.00     Years: 25.00     Pack years: 25.00     Last attempt to quit: 12/24/1996     Years since quitting: 22.2   ??? Smokeless tobacco: Never Used   Substance and Sexual Activity   ??? Alcohol use: No     Frequency: Never     Comment: ocassional ( did not answer)    ??? Drug use: No   ??? Sexual activity: Yes     Partners: Female     Comment: E.D.   Lifestyle   ??? Physical activity     Days per week: Not on file     Minutes per session: Not on file   ??? Stress: Not on file   Relationships   ??? Social Wellsite geologist on phone: Not on file     Gets together: Not on file     Attends religious service: Not on file     Active member of club or organization: Not on file     Attends meetings of clubs or organizations: Not on file  Relationship status: Not on file   ??? Intimate partner violence     Fear of current or ex partner: Not on file     Emotionally abused: Not on file     Physically abused: Not on file     Forced sexual activity: Not on file   Other Topics Concern   ??? Not on file   Social History Narrative   ??? Not on file       Current Outpatient Medications   Medication Sig Dispense Refill   ??? glipiZIDE (GLUCOTROL) 10 mg tablet      ??? gabapentin (NEURONTIN) 600 mg tablet      ??? cholecalciferol (Vitamin D3) (2,000 UNITS /50 MCG) cap capsule Take 2,000 Units by mouth daily. 90 Cap 0   ??? diclofenac EC (VOLTAREN) 75 mg EC tablet Take 1 Tab by mouth two (2) times daily (with meals). 60 Tab 2   ??? calcium carbonate (Calcium 500) 500 mg calcium (1,250 mg) tablet Take 1 Tab by mouth daily. 90 Tab 0   ??? metFORMIN ER (GLUCOPHAGE XR) 500 mg tablet Take 1,000 mg by mouth.     ??? TRULICITY 1.5 mg/0.5 mL sub-q pen      ??? Ca-D3-mag-zinc-cop-mang-boron (CALTRATE 600-D PLUS MINERALS) 600 mg calcium- 800 unit-40 mg chew Take 600 mg by mouth daily. 30 Tab 2   ??? metaxalone (SKELAXIN) 800 mg tablet Take 1 Tab by mouth two (2) times  daily as needed for Pain. 60 Tab 2   ??? aspirin (ASPIRIN) 325 mg tablet Take 81 mg by mouth.     ??? glucose blood VI test strips (ONETOUCH ULTRA BLUE TEST STRIP) strip TEST BLOOD GLUCOSE UP TO  QID     ??? metoprolol succinate (TOPROL-XL) 25 mg XL tablet Take 25 mg by mouth.     ??? omeprazole (PRILOSEC) 40 mg capsule Take 40 mg by mouth.     ??? tadalafil (CIALIS) 5 mg tablet Take 5 mg by mouth daily as needed. 30 Tab 5   ??? vardenafil (LEVITRA) 20 mg tablet Take 20 mg by mouth daily as needed for 10 doses. 10 Tab 5   ??? topiramate (TOPAMAX) 50 mg tablet      ??? diazePAM (VALIUM) 10 mg tablet 1 tab PO 30 minutes prior to procedure 1 Tab 0   ??? HYDROcodone-acetaminophen (NORCO) 5-325 mg per tablet Take 1 Tab by mouth.     ??? benzonatate (TESSALON) 200 mg capsule Take 200 mg by mouth.     ??? cyclobenzaprine (FLEXERIL) 10 mg tablet Take 5-10 mg by mouth.     ??? insulin glargine-lixisenatide (SOLIQUA 100/33) 100 unit-33 mcg/mL inpn 22 Units by SubCUTAneous route.     ??? rosuvastatin (CRESTOR) 20 mg tablet Take 20 mg by mouth.         Allergies   Allergen Reactions   ??? Augmentin [Amoxicillin-Pot Clavulanate] Anaphylaxis, Shortness of Breath and Palpitations     BP DROPS   ??? Codeine Other (comments)     COMBATIVE   ??? Dilaudid [Hydromorphone (Bulk)] Other (comments)     HALLUCINATIONS   ??? Percocet [Oxycodone-Acetaminophen] Nausea and Vomiting          PHYSICAL EXAMINATION    Visit Vitals  BP 145/81 (BP 1 Location: Right arm, BP Patient Position: Sitting)   Pulse 91   Temp 97.8 ??F (36.6 ??C) (Oral)   Resp 18   Ht 6' (1.829 m)   Wt 217 lb 6.4 oz (98.6 kg)   SpO2 97%  BMI 29.48 kg/m??       CONSTITUTIONAL: NAD, A&O x 3  SENSATION: Intact to light touch throughout  RANGE OF MOTION: The patient has full passive range of motion in all four extremities.  MOTOR:  Straight Leg Raise: Negative, bilateral     Hip Flex Knee Ext Knee Flex Ankle DF GTE Ankle PF Tone   Right +4/5 +4/5 +4/5 +4/5 +4/5 +4/5 +4/5   Left +4/5 +4/5 +4/5 +4/5 +4/5 +4/5 +4/5        ASSESSMENT   Diagnoses and all orders for this visit:    1. HNP (herniated nucleus pulposus with myelopathy), thoracic  -     CT SPINE [facility] W WO CONT; Future  -     XR MYELO THORACIC; Future    2. Thoracic neuritis  -     CT SPINE [facility] W WO CONT; Future  -     XR MYELO THORACIC; Future      IMPRESSION AND PLAN:  Patient returns to the office today with c/o pain radiating roughly at the T8 level bilaterally (R>L). Multiple treatment options were discussed. I ordered a Thoracic CT/myelogram secondary to radiologist recommendation. I advised patient to bring copies of films to next visit. In the interim, pending approval from Faythe Ghee, NP, I will d/c his Neurontin and try him on Lyrica 75 mg BID. Patient advised to call the office if intolerant to new medication.  Patient is neurologically intact.  I will see the patient back following CT/myelogram or earlier if needed.      Written by Remus Blake, ScribeKick, as dictated by Laney Pastor, MD  I examined the patient, reviewed and agree with the note.

## 2019-03-11 NOTE — Progress Notes (Signed)
Progress Notes by Andria Meuse, MD at 03/11/19 0915                Author: Andria Meuse, MD  Service: --  Author Type: Physician       Filed: 03/11/19 1012  Encounter Date: 03/11/2019  Status: Signed          Editor: Andria Meuse, MD (Physician)                       [facility]  [address]  [place], [place] [address]  Phone: [phone]  Fax: [phone]            PROGRESS NOTE         HISTORY OF PRESENT ILLNESS:   The patient is a 63 y.o.  male and was seen today for follow up of recurrence of acute, progressive right-sided thoracic spine pain x 2 years. He describes his pain primarily at the right lateral thoracic spine over the posterior  ribs roughly at T10. Records indicate this pain has been present intermittently over the last 10 years. His pain at this time is constant, and exacerbated with rotation.  Patient denies previous thoracic spinal surgery. He had thoracic spinal  injections by Dr. Oneida Arenas without relief. Previously, he completed PT with relief, but is not compliant with his HEP. Pt denies fever, weight loss, or skin changes. Pt denies change in bowel or bladder habits. He previously tolerated  NEURONTIN w/o relief. PmHx of recently Dx left-sided brain tumor, open-heart surgery, coronary artery disease, and uncontrolled DM.  He is followed by Dr. Delrae Sawyers and Dr. Thedore Mins, neurology. Previously, was followed by Dr. Buford Dresser for pain management. Patient is a nonsmoker.  The patient is LHD. Patient was last seen by me on 12/03/2008. My notes indicate at the time he had c/o chronic low back pain into the RLE. He had previously undergone spinal surgery in 1991 by Dr. Hulan Fess. Additionally, he endorsed right-sided thoracic  pain. We discussed setting him up for a MRI, patient noted he was claustrophobic. I referred him to PT and he failed to follow up. Note from Dr. Asa Saunas dated 09/27/2016 indicating patient had a bilateral RFA L4-5  and L5-S1, and indicated  he had right-sided rib pain. Note from Dr. Oneida Arenas dated 10/04/16 indicating patient was seen with c/o neck and low back pain without relief from lidocaine. Note from Dr. Oneida Arenas dated 11/02/16 indicating patient was underwent a T12-L1 block  with relief from back pain, but no relief from the side pain. He underwent a trigger point injection without relief. Note from Dr. Oneida Arenas dated 11/20/16 indicating patient was scheduled for a right-sided intercostal block; however, the previous  right-sided rib pain had largely resolved. Note from Dr. Oneida Arenas dated 12/31/16 indicating patient was seen with c/o pain that was largely myofascial. He was treated with lidocane and Neurontin without relief. He completed PT with relief and nortriptyline with  some relief. Treated with a tense unit. Indicated patient had intercostal myalgia. Note from Dr. Burt Knack dated 11/11/18 indicating patient was seen for peroneal tendonitis RLE. Referral note from [place], [place] dated 01/21/2019 indicating  patient was seen with c/o low back pain x 3 weeks and acute right-sided chest pain. Referred to spine. T Spine XR dated 01/30/2019 reviewed.  Per report, no significant abnormality. Chest XR dated 01/21/2019, revealed per report: no active cardiopulmonary disease.  DEXA Bone Denisty Study dated 06/27/2018 revealed: BMD measures consistent with  osteopenia/low bone density. Chest CT dated  03/08/2018, revealed per report: no evidence of pulmonary embolism or other acute process. C Spine MRI dated 10/29/16, revealed per report:  mild spinal canal narrowing at C6-C7 Mild spondylosis of the remaining cervical spine without significant stenosis. L Spine MRI dated 11/04/15,  revealed per report:  no significant interval change in the caliber of the central canal or neural foramina when compared to the prior exam. Mild formainal stenosis at L3-L4 and L4-L5. Left hemilaminectomy changes at L4-5.  T Spine MRI dated 09/03/14,  revealed per report:  mild central canal stenosis and moderate bilateral foraminal stenosis at T10-11. Elsewhere, no significant central canal or foraminal stenosis in the  thoracic spine. Degenerative disc changes. T Spine CT dated 06/27/10, revealed per report: no suspicious bony abnormalities. There degenerative  changes of the midthoracic spine. At his last clinical appointment,  patient was not a candidate for MDP due to uncontrolled DM. I set him  up for an open T Spine MRI.    ??   The patient returns today with pain radiating roughly at the T8 level bilaterally (R>L). He rates  his pain 0-10/10, previously 7-9/10. Pt reports he recently d/c TOPAMAX secondary to intolerance  and is now on Neurontin 600 mg BID as prescribed by Dr. Jason Coop office. My notes indicate he previously failed Neurontin. Pt reports Dx of a left-sided meningioma . Pt denies h/o seizures. Patient reports recently changing his DM medications and indicates his blood sugars are consistently remaining below 200. T Spine MRI  dated 02/12/2019 films reviewed. Per report, mildly motion degraded examination. Focal flattening of the dorsal thoracic cord with ventral displacement of the cord at T5 level and subtle increased cord signal at caudal T4 level, overall concerning for  ventral cord herniation but differential also includes dorsal arachnoid cyst. Further evaluation with thoracic CT myelogram is recommended.    ??      PMP reviewed. Body mass index is 29.48 kg/m??.      PCP: Cordelia Poche, NP           Past Medical History:        Diagnosis  Date         ?  Asthma       ?  Diabetes (HCC)            CONTROLLED W/FOOD AND DIET         ?  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  Erectile dysfunction           ?  Hypertension               Social History          Socioeconomic History         ?  Marital status:  MARRIED              Spouse name:  Not on file         ?  Number of children:  Not on file     ?  Years of  education:  Not on file     ?  Highest education level:  Not on file       Occupational History        ?  Not on file       Social Needs         ?  Financial resource strain:  Not on file        ?  Food insecurity              Worry:  Not on file         Inability:  Not on file        ?  Transportation needs              Medical:  Not on file         Non-medical:  Not on file       Tobacco Use         ?  Smoking status:  Former Smoker              Packs/day:  1.00         Years:  25.00         Pack years:  25.00         Last attempt to quit:  12/24/1996         Years since quitting:  22.2         ?  Smokeless tobacco:  Never Used       Substance and Sexual Activity         ?  Alcohol use:  No              Frequency:  Never             Comment: ocassional ( did not answer)          ?  Drug use:  No     ?  Sexual activity:  Yes              Partners:  Female             Comment: E.D.       Lifestyle        ?  Physical activity              Days per week:  Not on file         Minutes per session:  Not on file         ?  Stress:  Not on file       Relationships        ?  Social Engineer, manufacturing systems on phone:  Not on file         Gets together:  Not on file         Attends religious service:  Not on file         Active member of club or organization:  Not on file         Attends meetings of clubs or organizations:  Not on file         Relationship status:  Not on file        ?  Intimate partner violence              Fear of current or ex partner:  Not on file         Emotionally abused:  Not on file         Physically abused:  Not on file         Forced sexual activity:  Not on file        Other Topics  Concern        ?  Not on file       Social History Narrative        ?  Not on file  Current Outpatient Medications          Medication  Sig  Dispense  Refill           ?  glipiZIDE (GLUCOTROL) 10 mg tablet           ?  gabapentin (NEURONTIN) 600 mg tablet           ?  cholecalciferol (Vitamin D3) (2,000  UNITS /50 MCG) cap capsule  Take 2,000 Units by mouth daily.  90 Cap  0     ?  diclofenac EC (VOLTAREN) 75 mg EC tablet  Take 1 Tab by mouth two (2) times daily (with meals).  60 Tab  2     ?  calcium carbonate (Calcium 500) 500 mg calcium (1,250 mg) tablet  Take 1 Tab by mouth daily.  90 Tab  0     ?  metFORMIN ER (GLUCOPHAGE XR) 500 mg tablet  Take 1,000 mg by mouth.         ?  TRULICITY 1.5 mg/0.5 mL sub-q pen           ?  Ca-D3-mag-zinc-cop-mang-boron (CALTRATE 600-D PLUS MINERALS) 600 mg calcium- 800 unit-40 mg chew  Take 600 mg by mouth daily.  30 Tab  2     ?  metaxalone (SKELAXIN) 800 mg tablet  Take 1 Tab by mouth two (2) times daily as needed for Pain.  60 Tab  2     ?  aspirin (ASPIRIN) 325 mg tablet  Take 81 mg by mouth.         ?  glucose blood VI test strips (ONETOUCH ULTRA BLUE TEST STRIP) strip  TEST BLOOD GLUCOSE UP TO  QID         ?  metoprolol succinate (TOPROL-XL) 25 mg XL tablet  Take 25 mg by mouth.         ?  omeprazole (PRILOSEC) 40 mg capsule  Take 40 mg by mouth.         ?  tadalafil (CIALIS) 5 mg tablet  Take 5 mg by mouth daily as needed.  30 Tab  5     ?  vardenafil (LEVITRA) 20 mg tablet  Take 20 mg by mouth daily as needed for 10 doses.  10 Tab  5     ?  topiramate (TOPAMAX) 50 mg tablet           ?  diazePAM (VALIUM) 10 mg tablet  1 tab PO 30 minutes prior to procedure  1 Tab  0     ?  HYDROcodone-acetaminophen (NORCO) 5-325 mg per tablet  Take 1 Tab by mouth.         ?  benzonatate (TESSALON) 200 mg capsule  Take 200 mg by mouth.         ?  cyclobenzaprine (FLEXERIL) 10 mg tablet  Take 5-10 mg by mouth.         ?  insulin glargine-lixisenatide (SOLIQUA 100/33) 100 unit-33 mcg/mL inpn  22 Units by SubCUTAneous route.               ?  rosuvastatin (CRESTOR) 20 mg tablet  Take 20 mg by mouth.                 Allergies        Allergen  Reactions         ?  Augmentin [Amoxicillin-Pot Clavulanate]  Anaphylaxis, Shortness of Breath and Palpitations  BP DROPS         ?  Codeine   Other (comments)             COMBATIVE         ?  Dilaudid [Hydromorphone (Bulk)]  Other (comments)             HALLUCINATIONS         ?  Percocet [Oxycodone-Acetaminophen]  Nausea and Vomiting               PHYSICAL EXAMINATION      Visit Vitals      BP  145/81 (BP 1 Location: Right arm, BP Patient Position: Sitting)     Pulse  91     Temp  97.8 ??F (36.6 ??C) (Oral)     Resp  18     Ht  6' (1.829 m)     Wt  217 lb 6.4 oz (98.6 kg)     SpO2  97%        BMI  29.48 kg/m??          CONSTITUTIONAL: NAD, A&O x 3  SENSATION : Intact to light touch throughout   RANGE OF MOTION: The patient has full passive range of motion in all four extremities.   MOTOR:  Straight Leg Raise: Negative, bilateral                 Hip Flex  Knee Ext  Knee Flex  Ankle DF  GTE  Ankle PF  Tone              Right  +4/5  +4/5  +4/5  +4/5  +4/5  +4/5  +4/5              Left  +4/5  +4/5  +4/5  +4/5  +4/5  +4/5  +4/5           ASSESSMENT    Diagnoses and all orders for this visit:      1. HNP (herniated nucleus pulposus with myelopathy), thoracic   -     [facility] Future   -     XR MYELO THORACIC; Future      2. Thoracic neuritis   -     [facility]   -     XR MYELO THORACIC; Future         IMPRESSION AND PLAN:   Patient returns to the office today with c/o pain radiating roughly at the T8 level bilaterally (R>L). Multiple treatment options were discussed. I ordered a Thoracic CT/myelogram secondary to radiologist  recommendation. I advised patient to bring copies of films to next visit. In the interim, pending approval from Faythe GheeLacey Lyle, NP, I will d/c his Neurontin and try him on Lyrica 75 mg BID. Patient advised to call the office if intolerant to new medication.   Patient is neurologically intact.  I will see the patient back following CT/myelogram or earlier if needed.         Written by Remus BlakeSarah Griffith, ScribeKick, as dictated by Laney PastorNickolas Louvina Cleary, MD   I examined the patient, reviewed and agree with the  note.

## 2019-03-18 ENCOUNTER — Telehealth

## 2019-03-18 NOTE — Telephone Encounter (Signed)
Please discuss with Dr. Scotty Court if he feels this test is urgent/ stat

## 2019-03-18 NOTE — Telephone Encounter (Signed)
Do you want to make this STAT?

## 2019-03-18 NOTE — Telephone Encounter (Signed)
Patient spouse called reports a lot of pain.    Reports myelogram c/t thoracic scheduled at [facility] was 3/27 but called to cancel it due to the covid19 crisis. They didn't even try to reschedule it for a later date after covid19 crisis has passed.    [facility] told the patient they are only honoring the stat orders at this time.     Spouse Rosey Bath wants to know if a stat order can be put through.    They asked to speak with jennifer but anyone is okay.    Pt [phone]

## 2019-03-19 NOTE — Telephone Encounter (Signed)
Patient is aware.     I have also called and left a message for Kyle Mueller regarding stopping his Neurontin and starting Lyrica.

## 2019-03-19 NOTE — Telephone Encounter (Signed)
Spoke to [employer]. It is ok for Mr. Flatt to stop the Neurontin and start Lyrica. Please sign script.

## 2019-03-19 NOTE — Telephone Encounter (Signed)
My notes indicate this has been going on for several years.  I don't think it needs to be stat

## 2019-03-25 ENCOUNTER — Encounter: Attending: Physical Medicine & Rehabilitation | Primary: Registered Nurse

## 2019-03-25 MED ORDER — PREGABALIN 75 MG CAP
75 mg | ORAL_CAPSULE | Freq: Two times a day (BID) | ORAL | 1 refills | Status: AC
Start: 2019-03-25 — End: ?

## 2019-05-22 ENCOUNTER — Encounter: Attending: Physical Medicine & Rehabilitation | Primary: Registered Nurse

## 2019-07-31 DIAGNOSIS — S93601A Unspecified sprain of right foot, initial encounter: Secondary | ICD-10-CM | POA: Diagnosis not present

## 2019-08-12 DIAGNOSIS — Z961 Presence of intraocular lens: Secondary | ICD-10-CM | POA: Diagnosis not present

## 2019-08-12 DIAGNOSIS — E113413 Type 2 diabetes mellitus with severe nonproliferative diabetic retinopathy with macular edema, bilateral: Secondary | ICD-10-CM | POA: Diagnosis not present

## 2019-08-12 DIAGNOSIS — E11311 Type 2 diabetes mellitus with unspecified diabetic retinopathy with macular edema: Secondary | ICD-10-CM | POA: Diagnosis not present

## 2019-08-13 DIAGNOSIS — N529 Male erectile dysfunction, unspecified: Secondary | ICD-10-CM | POA: Insufficient documentation

## 2019-08-13 DIAGNOSIS — M79672 Pain in left foot: Secondary | ICD-10-CM | POA: Diagnosis not present

## 2019-08-13 DIAGNOSIS — E1165 Type 2 diabetes mellitus with hyperglycemia: Secondary | ICD-10-CM | POA: Diagnosis not present

## 2019-08-13 DIAGNOSIS — I2510 Atherosclerotic heart disease of native coronary artery without angina pectoris: Secondary | ICD-10-CM | POA: Diagnosis not present

## 2019-08-13 DIAGNOSIS — J45909 Unspecified asthma, uncomplicated: Secondary | ICD-10-CM | POA: Insufficient documentation

## 2019-08-13 DIAGNOSIS — Z794 Long term (current) use of insulin: Secondary | ICD-10-CM | POA: Diagnosis not present

## 2019-08-13 DIAGNOSIS — D329 Benign neoplasm of meninges, unspecified: Secondary | ICD-10-CM | POA: Diagnosis not present

## 2019-08-13 DIAGNOSIS — E1139 Type 2 diabetes mellitus with other diabetic ophthalmic complication: Secondary | ICD-10-CM | POA: Diagnosis not present

## 2019-08-13 HISTORY — DX: Male erectile dysfunction, unspecified: N52.9

## 2019-08-26 DIAGNOSIS — M722 Plantar fascial fibromatosis: Secondary | ICD-10-CM | POA: Diagnosis not present

## 2019-08-26 DIAGNOSIS — M7732 Calcaneal spur, left foot: Secondary | ICD-10-CM | POA: Diagnosis not present

## 2019-08-26 DIAGNOSIS — M19172 Post-traumatic osteoarthritis, left ankle and foot: Secondary | ICD-10-CM | POA: Diagnosis not present

## 2019-08-26 DIAGNOSIS — S92114A Nondisplaced fracture of neck of right talus, initial encounter for closed fracture: Secondary | ICD-10-CM | POA: Diagnosis not present

## 2019-08-26 DIAGNOSIS — M79671 Pain in right foot: Secondary | ICD-10-CM | POA: Diagnosis not present

## 2019-08-26 DIAGNOSIS — M19072 Primary osteoarthritis, left ankle and foot: Secondary | ICD-10-CM | POA: Diagnosis not present

## 2019-08-26 DIAGNOSIS — M7731 Calcaneal spur, right foot: Secondary | ICD-10-CM | POA: Diagnosis not present

## 2019-08-27 DIAGNOSIS — Z23 Encounter for immunization: Secondary | ICD-10-CM | POA: Diagnosis not present

## 2019-08-27 DIAGNOSIS — D329 Benign neoplasm of meninges, unspecified: Secondary | ICD-10-CM | POA: Diagnosis not present

## 2019-09-03 ENCOUNTER — Other Ambulatory Visit: Payer: Self-pay | Admitting: Neurosurgery

## 2019-09-03 DIAGNOSIS — D329 Benign neoplasm of meninges, unspecified: Secondary | ICD-10-CM

## 2019-09-06 DIAGNOSIS — M722 Plantar fascial fibromatosis: Secondary | ICD-10-CM

## 2019-09-06 DIAGNOSIS — G43909 Migraine, unspecified, not intractable, without status migrainosus: Secondary | ICD-10-CM | POA: Insufficient documentation

## 2019-09-06 DIAGNOSIS — Z86718 Personal history of other venous thrombosis and embolism: Secondary | ICD-10-CM | POA: Insufficient documentation

## 2019-09-06 HISTORY — DX: Plantar fascial fibromatosis: M72.2

## 2019-09-07 ENCOUNTER — Other Ambulatory Visit: Payer: Self-pay | Admitting: Physician Assistant

## 2019-09-07 DIAGNOSIS — S92114A Nondisplaced fracture of neck of right talus, initial encounter for closed fracture: Secondary | ICD-10-CM

## 2019-09-11 ENCOUNTER — Other Ambulatory Visit: Payer: Self-pay

## 2019-09-11 ENCOUNTER — Ambulatory Visit
Admission: RE | Admit: 2019-09-11 | Discharge: 2019-09-11 | Disposition: A | Discharge status: Home / Self Care | Payer: 59 | Source: Ambulatory Visit | Attending: Physician Assistant | Admitting: Physician Assistant

## 2019-09-11 DIAGNOSIS — S92901A Unspecified fracture of right foot, initial encounter for closed fracture: Secondary | ICD-10-CM | POA: Diagnosis not present

## 2019-09-11 DIAGNOSIS — S92114A Nondisplaced fracture of neck of right talus, initial encounter for closed fracture: Secondary | ICD-10-CM | POA: Diagnosis not present

## 2019-09-11 DIAGNOSIS — M19071 Primary osteoarthritis, right ankle and foot: Secondary | ICD-10-CM | POA: Diagnosis not present

## 2019-09-11 IMAGING — CT CT FOOT*R* W/O CM
1 of 5 series · 9 of 20 positions shown, 12 images · non-contrast
Comparison: None.

CLINICAL DATA: Closed nondisplaced fractures.

EXAM:
CT OF THE RIGHT FOOT WITHOUT CONTRAST
TECHNIQUE: Multidetector CT imaging of the right foot was performed according
to the standard protocol. Multiplanar CT image reconstructions were
also generated.

[Series 8: axial st foot · coronal · 0.28mm/px · 9 of 289 slices shown, 12 images]
[im 73/289  soft-tissue]
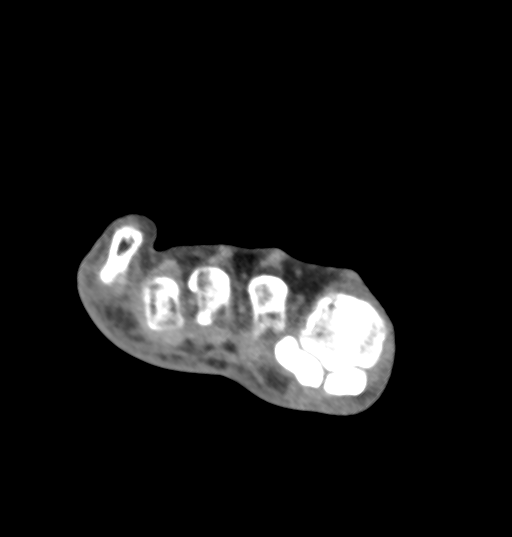
[im 73/289  bone]
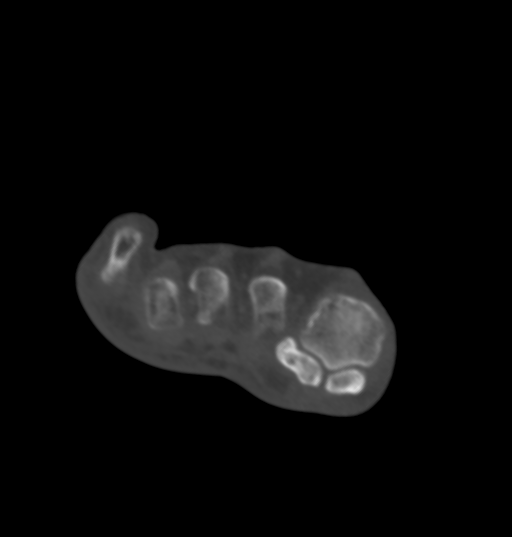
[im 88/289  bone]
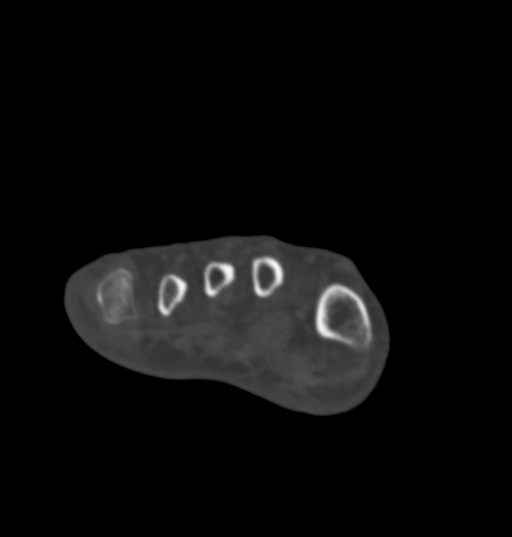
[im 110/289  bone]
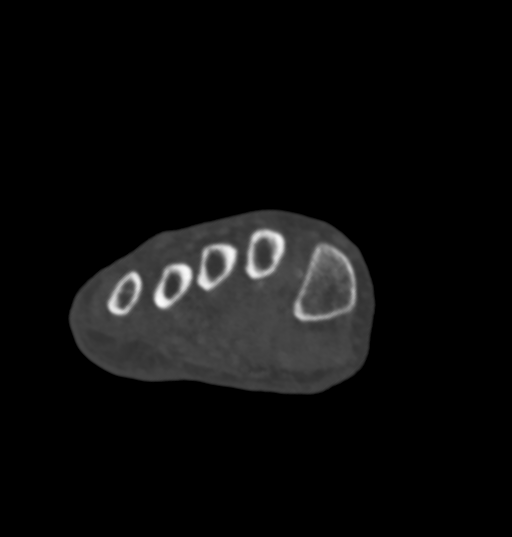
[im 133/289  bone]
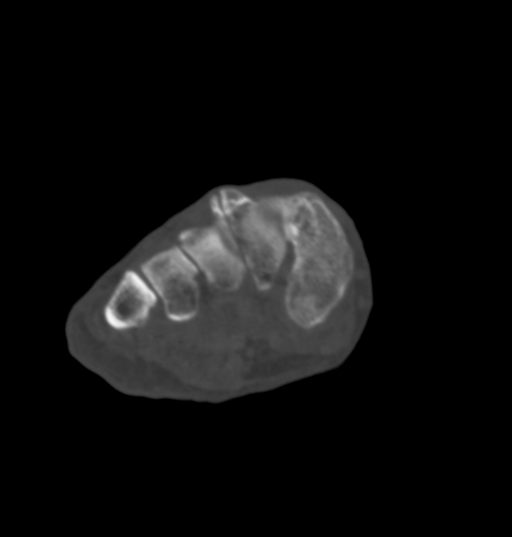
[im 145/289  soft-tissue]
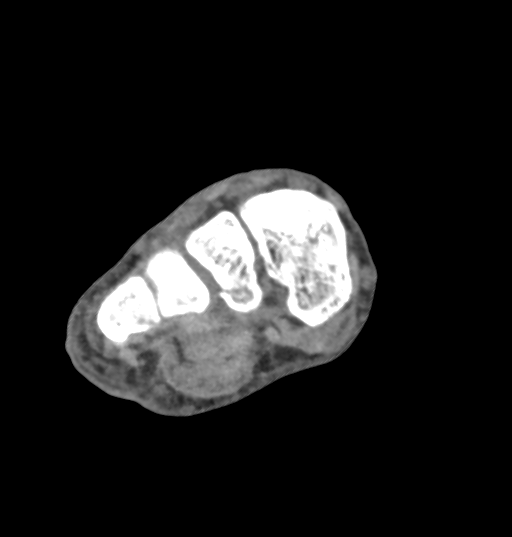
[im 145/289  bone]
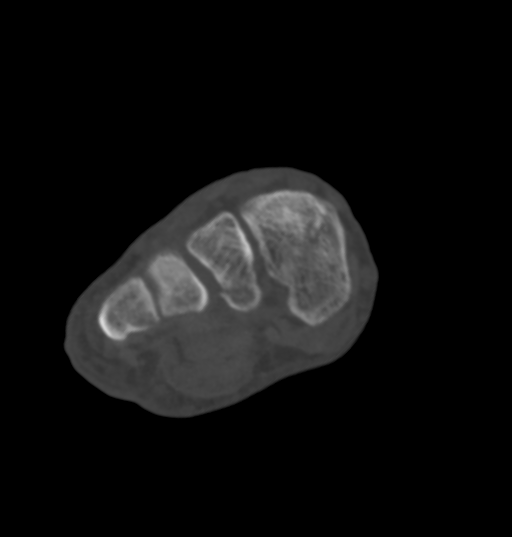
[im 156/289  bone]
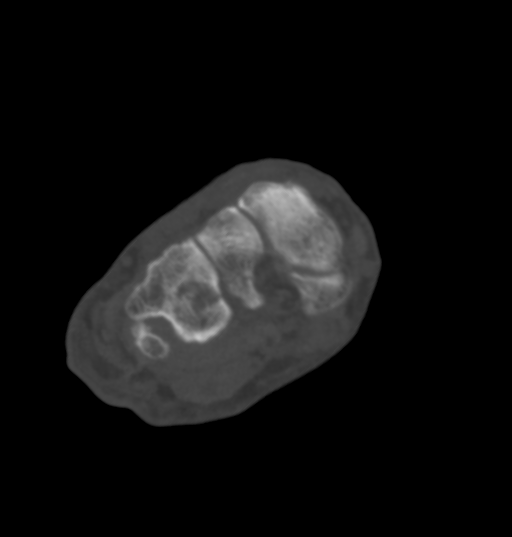
[im 179/289  bone]
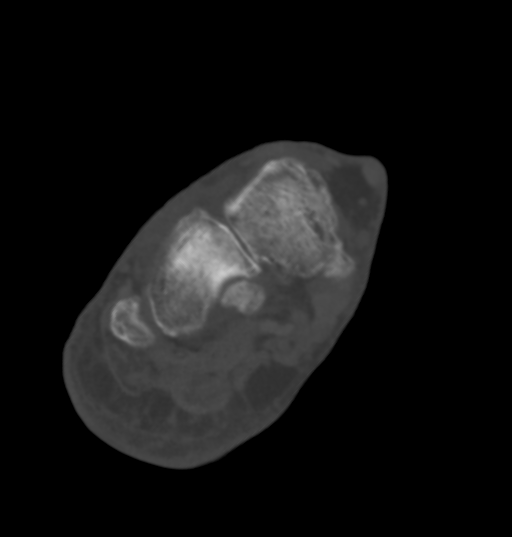
[im 202/289  bone]
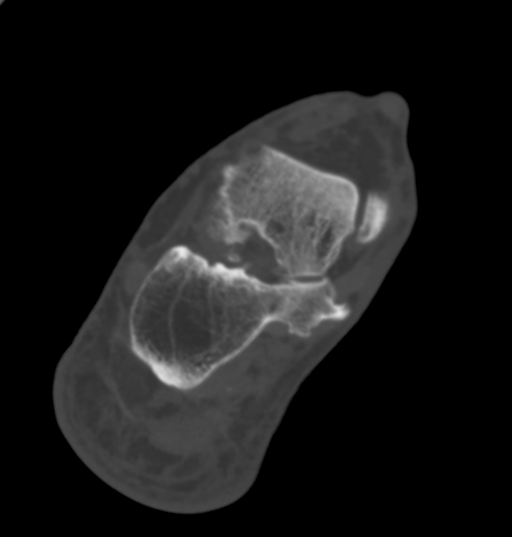
[im 217/289  soft-tissue]
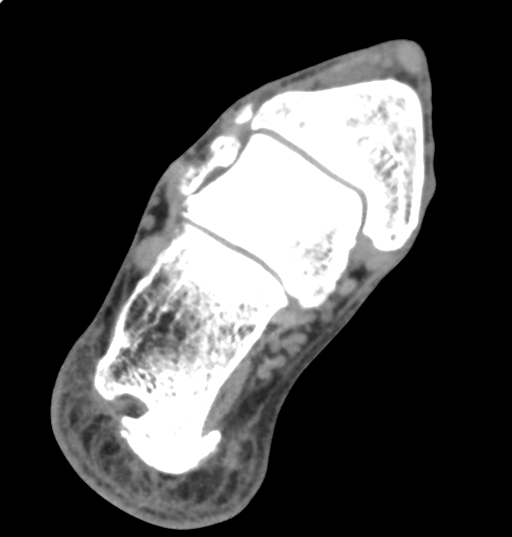
[im 217/289  bone]
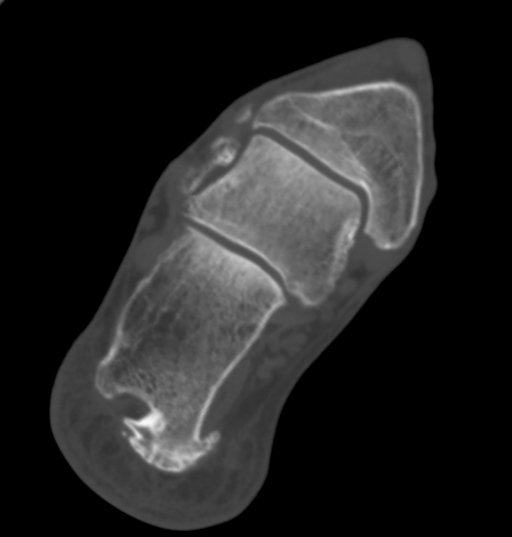

[9 of 20 positions shown; findings below may reference images not displayed]

FINDINGS: Bones/Joint/Cartilage

There are nondisplaced fractures of plantar aspect of lateral
cuneiform and of the cuboid. The fractures appear partially healed.
The medial and middle cuneiforms are fused. No other visible
fractures. There are chronic degenerative changes of the subtalar
joint with ossified masses in the sinus tarsi. There is arthritis of
the middle and anterior facets of the subtalar joint.

Old deformity of the posterior aspect of the calcaneus. Chronic
hypertrophy of the distal Achilles tendon. Slight arthritic changes
of the first MTP joint. Old healed fracture of the proximal phalanx
of fourth toe.

Plantar calcaneal enthesophytes. There are slight arthritic changes
of the medial aspect of the ankle joint.

Ligaments

Suboptimally assessed by CT. Anterior and posterior talofibular
ligaments are intact. Deltoid ligament is not well enough seen for
assessment.

Muscles and Tendons

No discrete abnormality.

Soft tissues

No significant abnormality.
IMPRESSION: 1. Partially healed fractures of the plantar aspects of the lateral
cuneiform and of the cuboid.
2. Osteoarthritis of the subtalar joint with ossified masses in the
sinus tarsi.
3. Old deformity of the posterior aspect of the calcaneus.
4. Old healed fracture of the proximal phalanx of fourth toe.
5. Chronic hypertrophy of the distal Achilles tendon.

## 2019-09-11 IMAGING — CT CT ANKLE*R* W/O CM
3 of 4 series · 11 of 35 positions shown, 13 images · non-contrast
Comparison: None.

CLINICAL DATA: Closed nondisplaced fractures.

EXAM:
CT OF THE RIGHT ANKLE WITHOUT CONTRAST
TECHNIQUE: Multidetector CT imaging of the right ankle was performed according
to the standard protocol. Multiplanar CT image reconstructions were
also generated.

[Series 8: axial st foot · coronal · 0.28mm/px · 2 of 289 slices shown]
[im 206/289  bone]
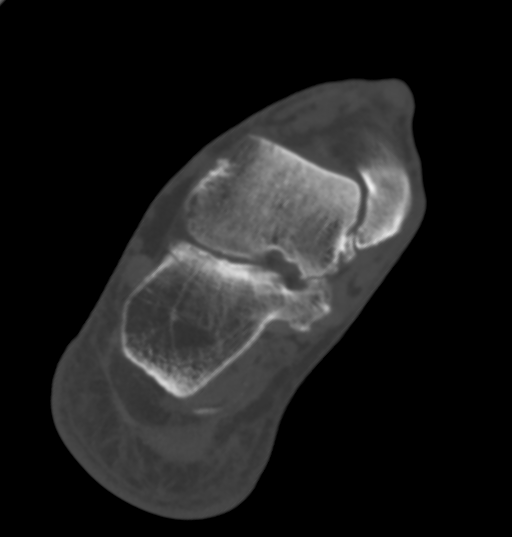
[im 247/289  bone]
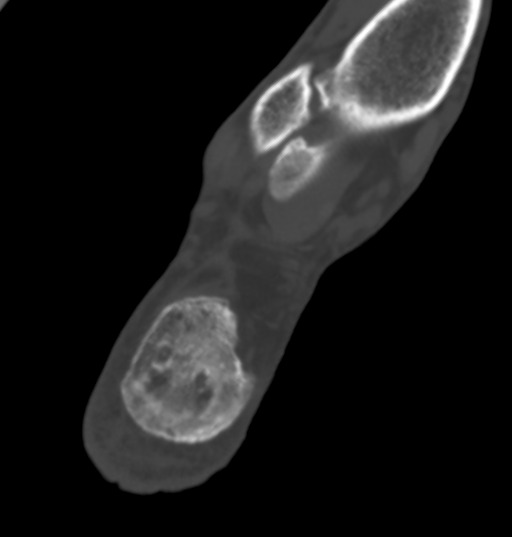

[Series 14: ax axial st ankle 2 · axial · 0.22mm/px · z∈[-566,-416]mm · 4 of 110 slices shown, 5 images]
[im 17/110  soft-tissue]
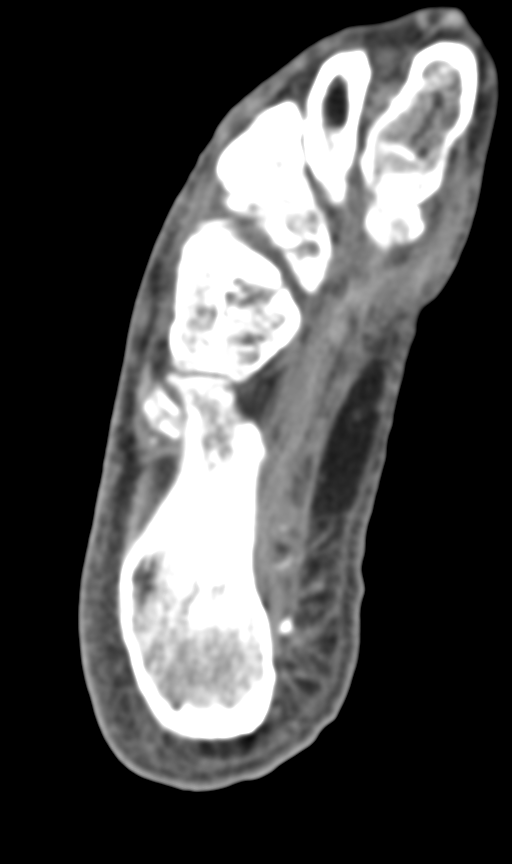
[im 17/110  bone]
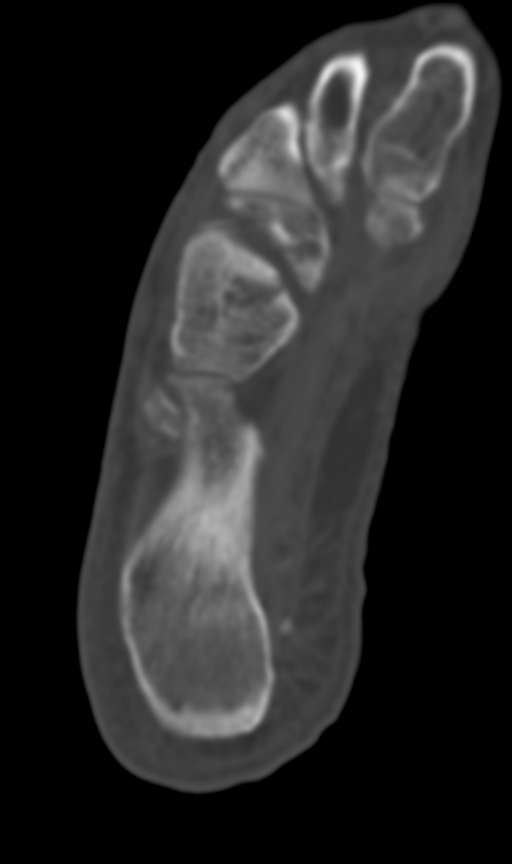
[im 42/110  bone]
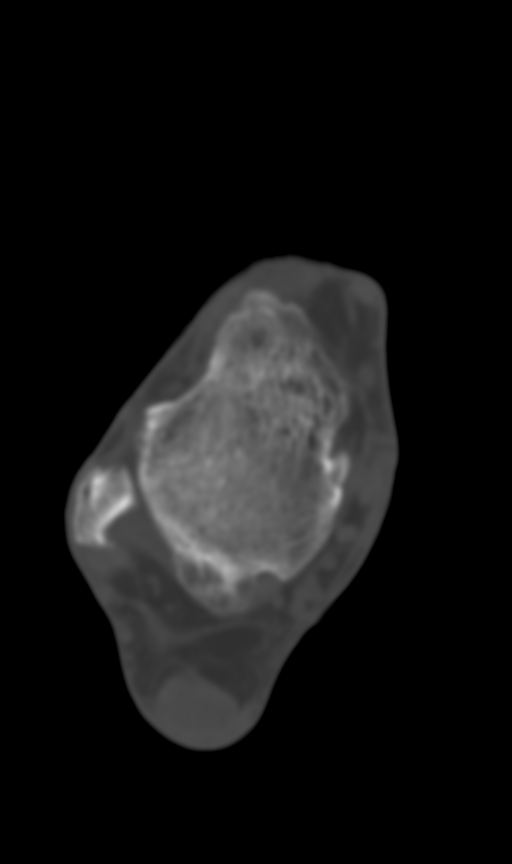
[im 68/110  bone]
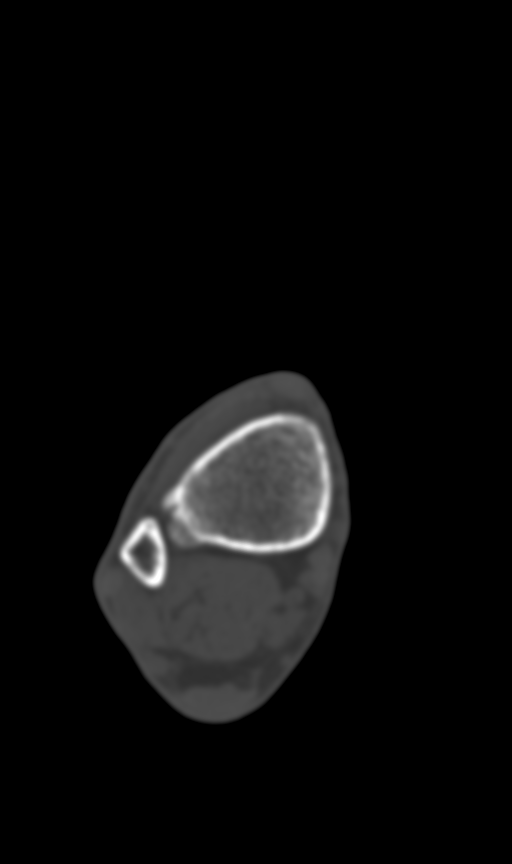
[im 93/110  bone]
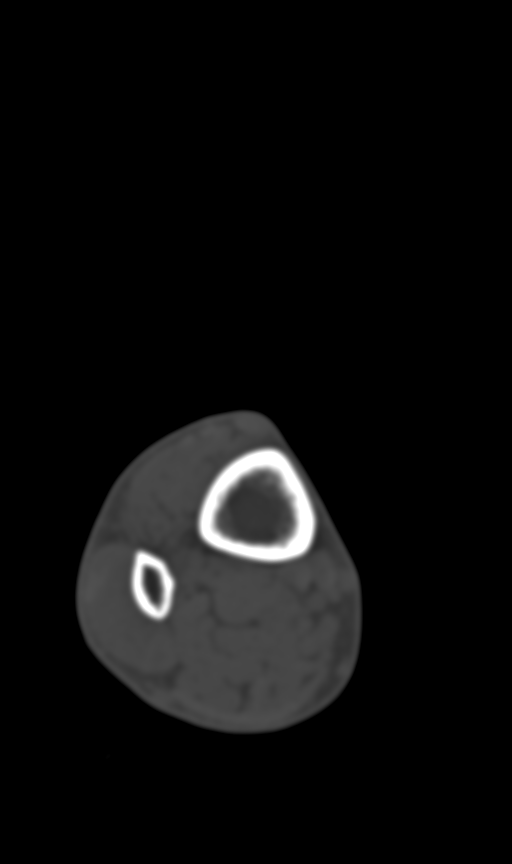

[Series 18: sag st ankle · sagittal · 0.37mm/px · 5 of 40 slices shown, 6 images]
[im 14/40  bone]
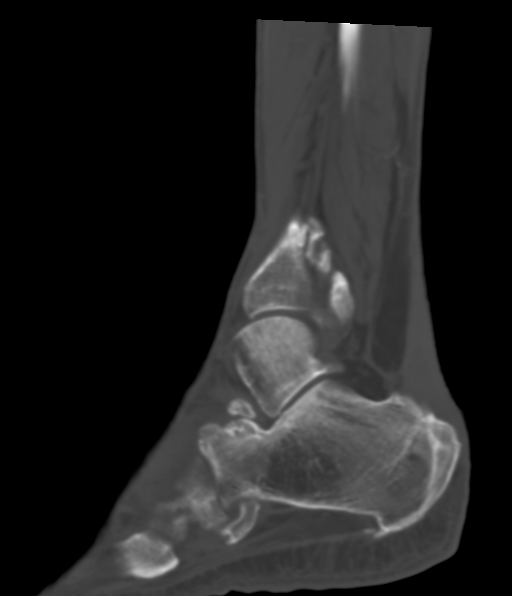
[im 17/40  bone]
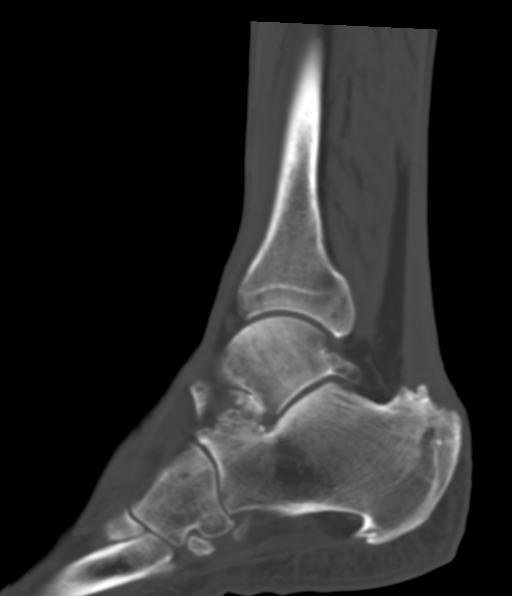
[im 20/40  soft-tissue]
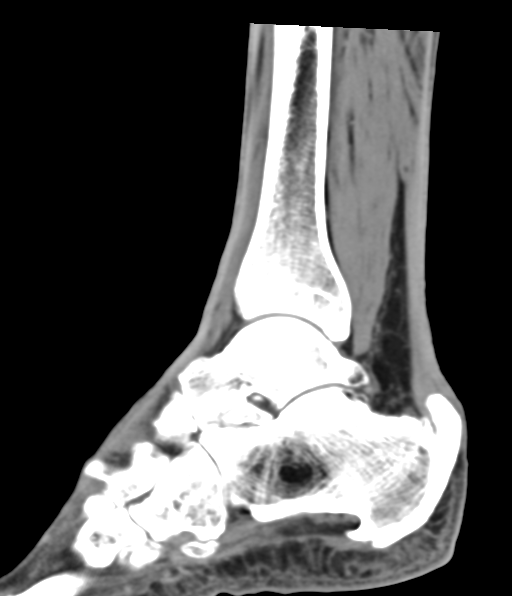
[im 20/40  bone]
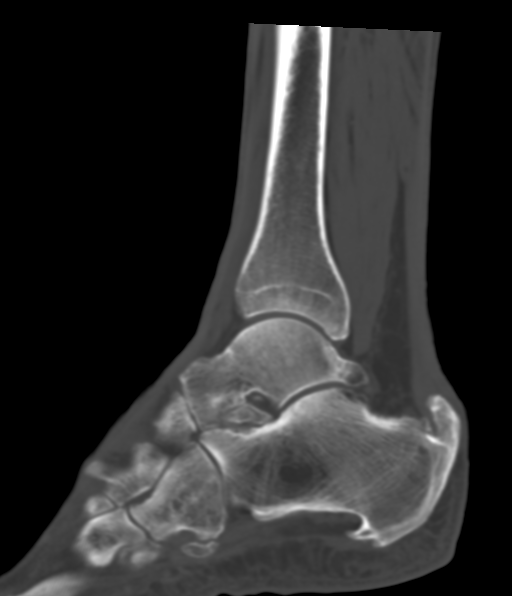
[im 23/40  bone]
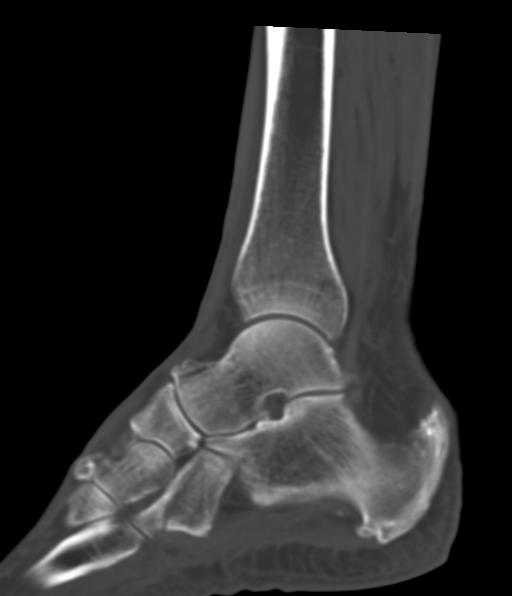
[im 27/40  bone]
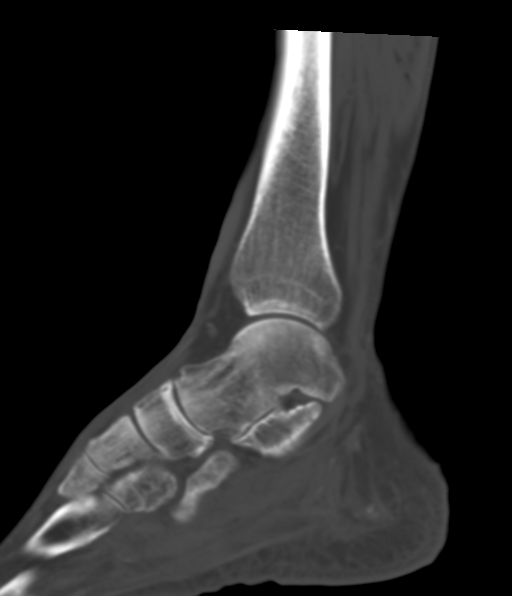

[11 of 35 positions shown; findings below may reference images not displayed]

FINDINGS: Bones/Joint/Cartilage

There are moderate degenerative changes at the medial aspect of the
ankle between the medial malleolus and the medial aspect of the
talus. Small osteophyte on the tip of the lateral malleolus. No
ankle joint effusion. Moderate arthritis of the middle and anterior
facets of the subtalar joint. Ossified masses in the sinus tarsi,
possibly representing ossified cartilaginous loose bodies.

Old deformity of the posterior aspect of the distal calcaneus with
what is probably chronic hypertrophy of the distal Achilles tendon.
Plantar calcaneal spur.

Ligaments

Suboptimally assessed by CT. The deltoid ligament is not well enough
seen to evaluated. The anterior and posterior talofibular ligaments
appear to be intact. Calcaneofibular ligament is not identified.

Muscles and Tendons

Other than the hypertrophy of the Achilles tendon, the other tendons
around the ankle appear normal.

Soft tissues

Negative.
IMPRESSION: 1. No acute osseous abnormality.
2. Osteoarthritis of the ankle and subtalar joints as described.
3. Probable chronic hypertrophy of the distal Achilles tendon.
4. Probable ossified cartilaginous loose bodies in the sinus tarsi.
5. Old deformity of the posterior aspect of the distal calcaneus
with what is probably chronic hypertrophy of the distal Achilles
tendon.

## 2019-09-16 DIAGNOSIS — E7800 Pure hypercholesterolemia, unspecified: Secondary | ICD-10-CM | POA: Diagnosis not present

## 2019-09-16 DIAGNOSIS — I2510 Atherosclerotic heart disease of native coronary artery without angina pectoris: Secondary | ICD-10-CM | POA: Diagnosis not present

## 2019-09-16 DIAGNOSIS — I10 Essential (primary) hypertension: Secondary | ICD-10-CM | POA: Diagnosis not present

## 2019-09-17 ENCOUNTER — Ambulatory Visit
Admission: RE | Admit: 2019-09-17 | Discharge: 2019-09-17 | Disposition: A | Discharge status: Home / Self Care | Payer: 59 | Source: Ambulatory Visit | Attending: Neurosurgery | Admitting: Neurosurgery

## 2019-09-17 ENCOUNTER — Other Ambulatory Visit: Payer: Self-pay

## 2019-09-17 DIAGNOSIS — D329 Benign neoplasm of meninges, unspecified: Secondary | ICD-10-CM | POA: Insufficient documentation

## 2019-09-17 LAB — POCT I-STAT CREATININE: Creatinine, Ser: 0.7 mg/dL (ref 0.61–1.24)

## 2019-09-17 IMAGING — MR MR HEAD WO/W CM
15 series · 48 of 48 positions shown · IV contrast (gadavist)
Comparison: None.

CLINICAL DATA: History meningioma

EXAM:
MRI HEAD WITHOUT AND WITH CONTRAST
TECHNIQUE: Multiplanar, multiecho pulse sequences of the brain and surrounding
structures were obtained without and with intravenous contrast.
CONTRAST:  10mL GADAVIST GADOBUTROL 1 MMOL/ML IV SOLN

[Series 5: ax dwi_tracew · axial · 3.0mm · 0.60mm/px · z∈[-73,+82]mm · 4 of 48 slices shown]
[im 1/48]
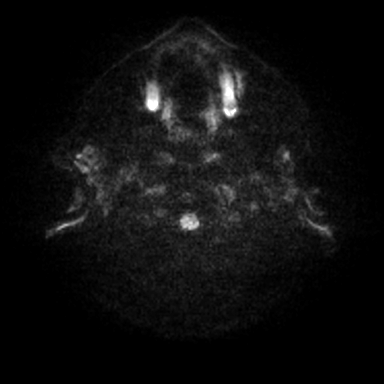
[im 16/48]
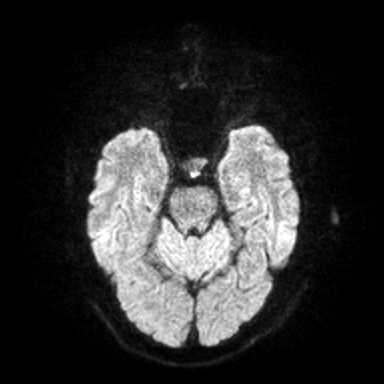
[im 32/48]
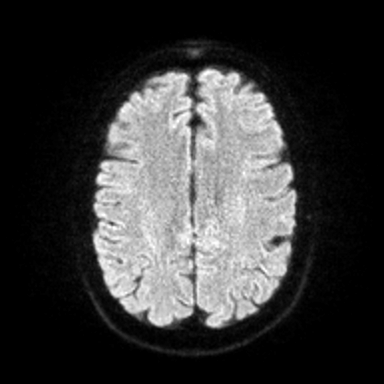
[im 48/48]
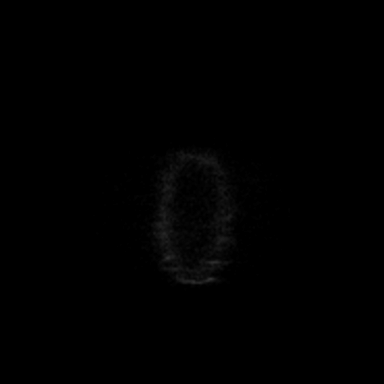

[Series 6: ax dwi_adc · axial · 3.0mm · 0.60mm/px · z∈[-73,+82]mm · 4 of 48 slices shown]
[im 1/48]
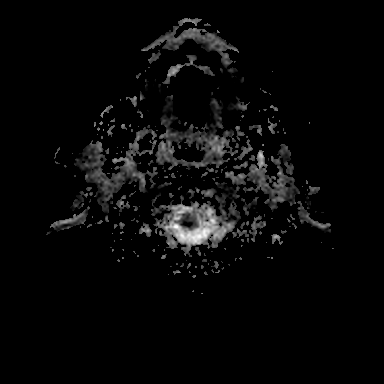
[im 16/48]
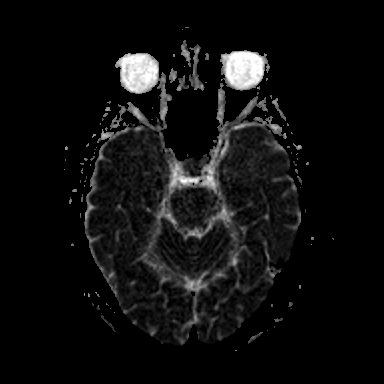
[im 32/48]
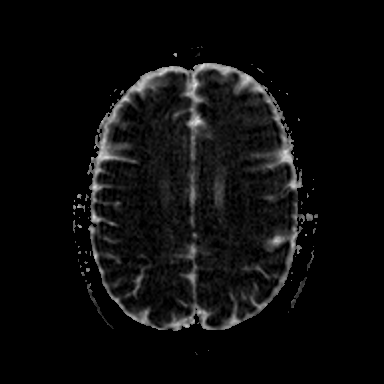
[im 48/48]
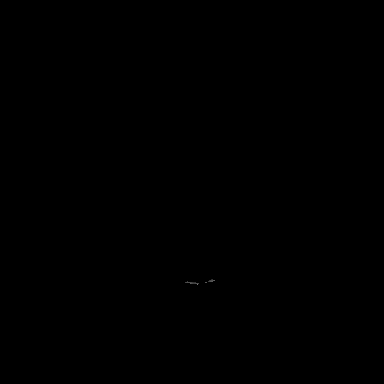

[Series 7: cor dwi_tracew · coronal · 5.0mm · 0.60mm/px · 2 of 38 slices shown]
[im 1/38]
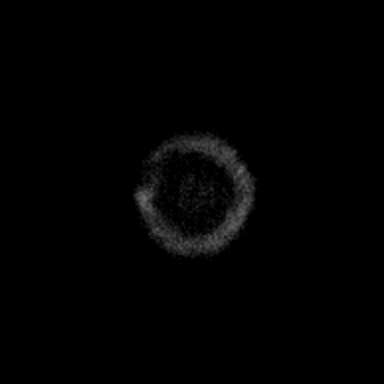
[im 38/38]
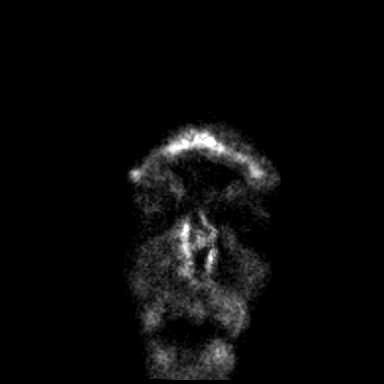

[Series 8: cor dwi_adc · coronal · 5.0mm · 0.60mm/px · 2 of 38 slices shown]
[im 1/38]
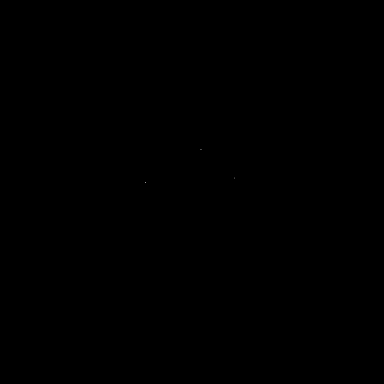
[im 38/38]
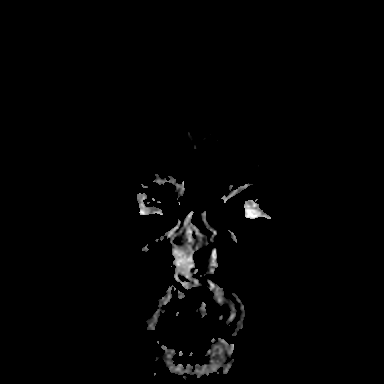

[Series 9: T1 · sagittal · 5.0mm · 0.62mm/px · 1 of 22 slices shown (1 of 2)]
[im 1/22]
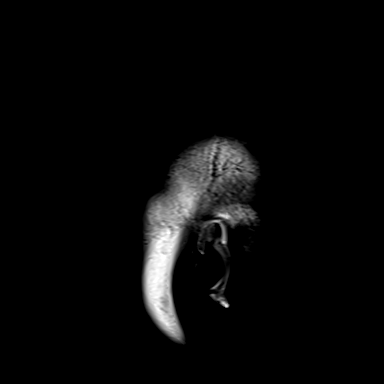

[Series 10: T2 · axial · 5.0mm · 0.53mm/px · 1 of 25 slices shown]
[im 1/25]
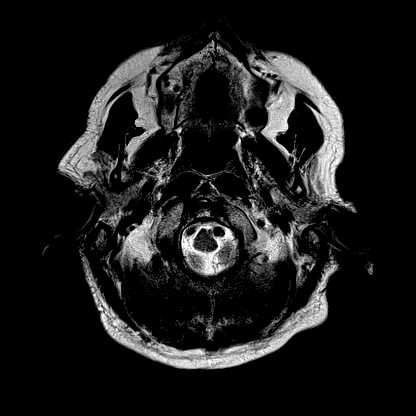

[Series 11: mag_images · axial · 3.0mm · 0.90mm/px · z∈[-83,+93]mm · 3 of 60 slices shown]
[im 1/60]
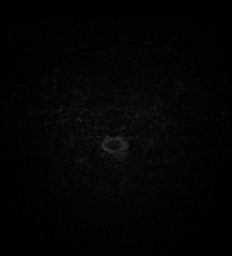
[im 30/60]
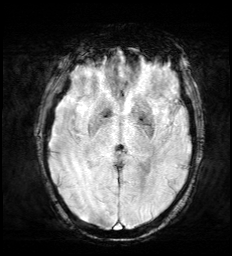
[im 60/60]
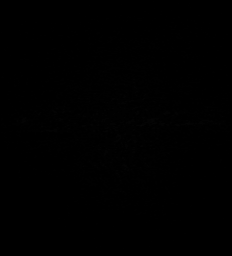

[Series 12: pha_images · axial · 3.0mm · 0.90mm/px · z∈[-80,+93]mm · 3 of 59 slices shown]
[im 1/59]
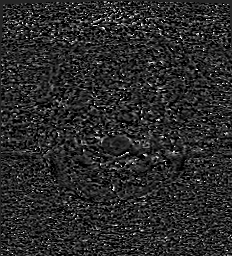
[im 30/59]
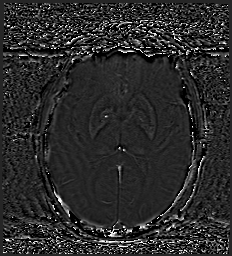
[im 59/59]
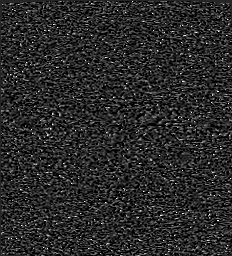

[Series 13: swi_images · axial · 3.0mm · 0.90mm/px · z∈[-83,+93]mm · 3 of 60 slices shown]
[im 1/60]
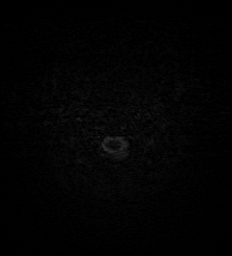
[im 30/60]
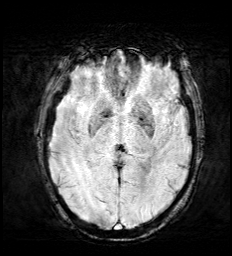
[im 60/60]
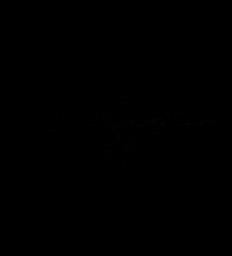

[Series 16: T1 · axial · 1.0mm · 0.98mm/px · z∈[-74,+85]mm · 9 of 160 slices shown (2 of 2)]
[im 1/160]
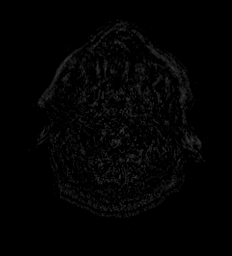
[im 20/160]
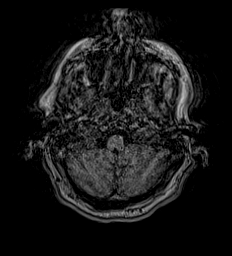
[im 40/160]
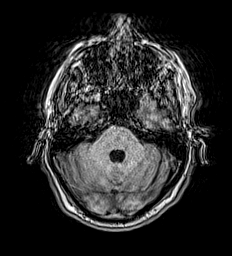
[im 60/160]
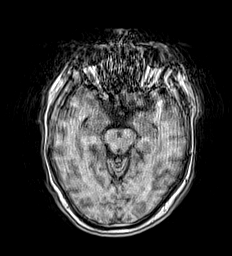
[im 80/160]
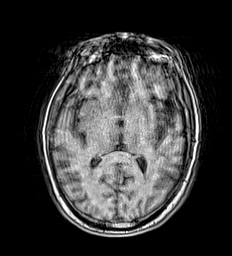
[im 100/160]
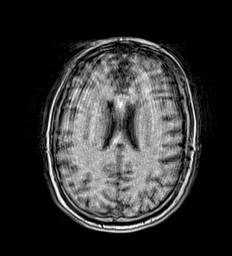
[im 120/160]
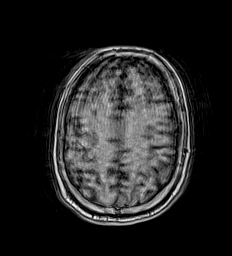
[im 140/160]
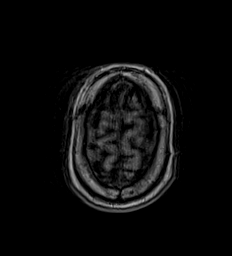
[im 160/160]
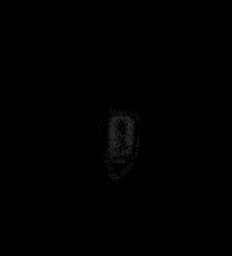

[Series 17: FLAIR · axial · 5.0mm · 1.20mm/px · z∈[-73,+83]mm · 2 of 27 slices shown]
[im 1/27]
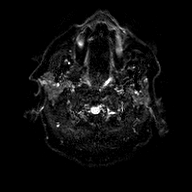
[im 27/27]
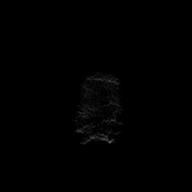

[Series 18: T2 post-contrast · coronal · 5.0mm · 0.57mm/px · 2 of 29 slices shown]
[im 1/29]
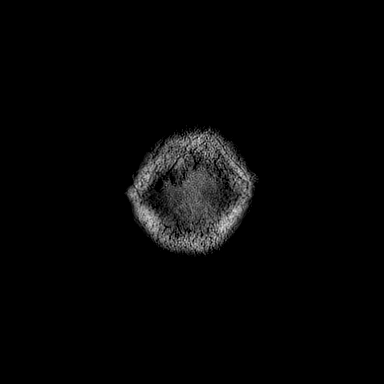
[im 29/29]
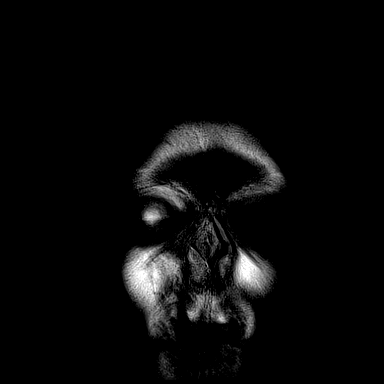

[Series 19: T1 post-contrast · axial · 1.0mm · 0.98mm/px · z∈[-74,+85]mm · 9 of 160 slices shown (1 of 3)]
[im 1/160]
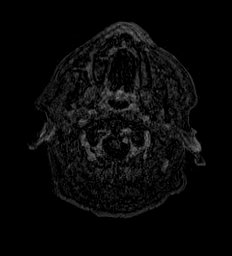
[im 20/160]
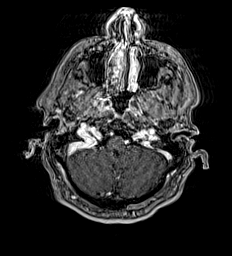
[im 40/160]
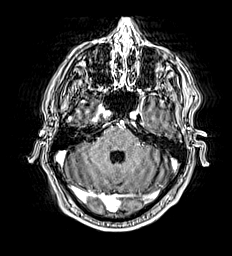
[im 60/160]
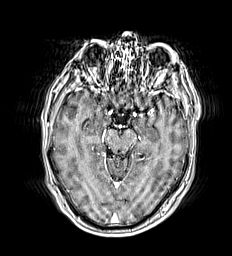
[im 80/160]
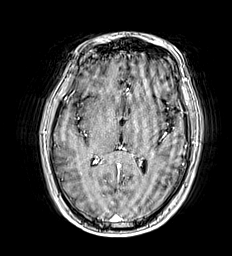
[im 100/160]
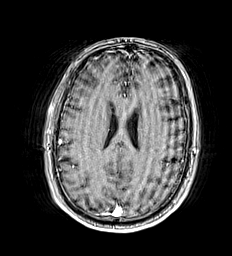
[im 120/160]
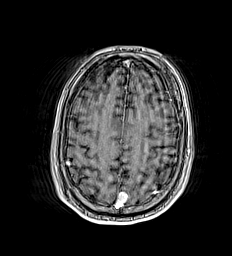
[im 140/160]
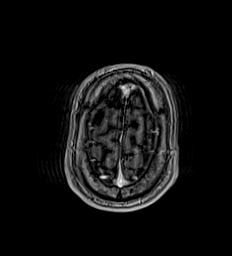
[im 160/160]
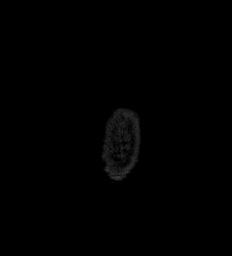

[Series 20: T1 post-contrast · coronal · 5.0mm · 0.57mm/px · 2 of 29 slices shown (2 of 3)]
[im 1/29]
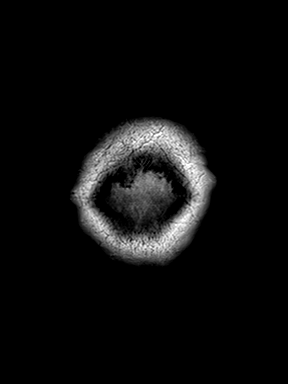
[im 29/29]
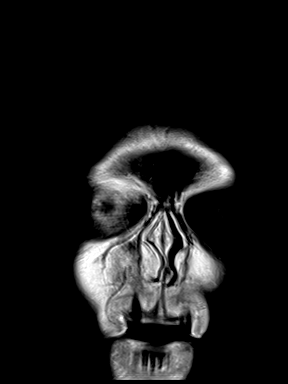

[Series 21: T1 post-contrast · sagittal · 5.0mm · 0.62mm/px · 1 of 22 slices shown (3 of 3)]
[im 1/22]
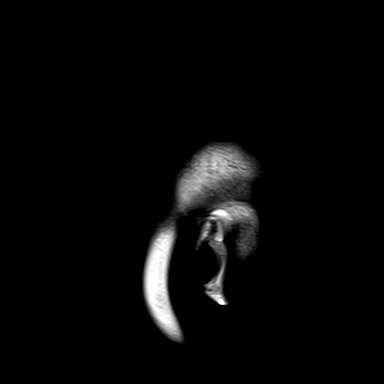

[48 of 48 positions shown; findings below may reference images not displayed]

FINDINGS: Brain: Motion degraded study.

Enhancing mass left parietal parasagittal location adjacent to the
superior sagittal sinus. The mass measures 9 x 12 mm with
homogeneous enhancement. There appears to be extension of tumor into
the superior sagittal sinus which remains patent. No other enhancing
lesions

Ventricle size normal.  Negative for acute infarct or hemorrhage.

Vascular: Normal arterial flow voids

Skull and upper cervical spine: Negative

Sinuses/Orbits: Negative

Other: None
IMPRESSION: 9 x 12 mm left parasagittal parietal meningioma. Tumor appears to
extend into the superior sagittal sinus. Sinus remains patent.
Otherwise negative

Motion degraded study.

## 2019-09-17 MED ORDER — GADOBUTROL 1 MMOL/ML IV SOLN
10.0000 mL | Freq: Once | INTRAVENOUS | Status: AC | PRN
  Administered 2019-09-17: 11:00:00 10 mL via INTRAVENOUS

## 2019-09-23 DIAGNOSIS — E1141 Type 2 diabetes mellitus with diabetic mononeuropathy: Secondary | ICD-10-CM | POA: Diagnosis not present

## 2019-09-23 DIAGNOSIS — S92221D Displaced fracture of lateral cuneiform of right foot, subsequent encounter for fracture with routine healing: Secondary | ICD-10-CM | POA: Diagnosis not present

## 2019-09-23 DIAGNOSIS — X501XXD Overexertion from prolonged static or awkward postures, subsequent encounter: Secondary | ICD-10-CM | POA: Diagnosis not present

## 2019-09-23 DIAGNOSIS — E7800 Pure hypercholesterolemia, unspecified: Secondary | ICD-10-CM | POA: Diagnosis not present

## 2019-09-23 DIAGNOSIS — M19072 Primary osteoarthritis, left ankle and foot: Secondary | ICD-10-CM | POA: Diagnosis not present

## 2019-09-23 DIAGNOSIS — S92214D Nondisplaced fracture of cuboid bone of right foot, subsequent encounter for fracture with routine healing: Secondary | ICD-10-CM | POA: Diagnosis not present

## 2019-09-23 DIAGNOSIS — M7731 Calcaneal spur, right foot: Secondary | ICD-10-CM | POA: Diagnosis not present

## 2019-09-23 DIAGNOSIS — I10 Essential (primary) hypertension: Secondary | ICD-10-CM | POA: Diagnosis not present

## 2019-09-23 DIAGNOSIS — Z23 Encounter for immunization: Secondary | ICD-10-CM | POA: Diagnosis not present

## 2019-10-05 DIAGNOSIS — R519 Headache, unspecified: Secondary | ICD-10-CM | POA: Diagnosis not present

## 2019-10-05 DIAGNOSIS — D329 Benign neoplasm of meninges, unspecified: Secondary | ICD-10-CM | POA: Diagnosis not present

## 2019-10-14 DIAGNOSIS — G43709 Chronic migraine without aura, not intractable, without status migrainosus: Secondary | ICD-10-CM | POA: Diagnosis not present

## 2019-10-14 DIAGNOSIS — Z794 Long term (current) use of insulin: Secondary | ICD-10-CM | POA: Diagnosis not present

## 2019-10-14 DIAGNOSIS — I10 Essential (primary) hypertension: Secondary | ICD-10-CM | POA: Diagnosis not present

## 2019-10-14 DIAGNOSIS — E1139 Type 2 diabetes mellitus with other diabetic ophthalmic complication: Secondary | ICD-10-CM | POA: Diagnosis not present

## 2019-10-14 DIAGNOSIS — R252 Cramp and spasm: Secondary | ICD-10-CM | POA: Diagnosis not present

## 2019-10-14 DIAGNOSIS — M79672 Pain in left foot: Secondary | ICD-10-CM | POA: Diagnosis not present

## 2019-10-14 DIAGNOSIS — E1165 Type 2 diabetes mellitus with hyperglycemia: Secondary | ICD-10-CM | POA: Diagnosis not present

## 2019-10-14 DIAGNOSIS — D329 Benign neoplasm of meninges, unspecified: Secondary | ICD-10-CM | POA: Diagnosis not present

## 2019-10-22 DIAGNOSIS — I2510 Atherosclerotic heart disease of native coronary artery without angina pectoris: Secondary | ICD-10-CM | POA: Diagnosis not present

## 2019-10-27 DIAGNOSIS — X58XXXD Exposure to other specified factors, subsequent encounter: Secondary | ICD-10-CM | POA: Diagnosis not present

## 2019-10-27 DIAGNOSIS — M7731 Calcaneal spur, right foot: Secondary | ICD-10-CM | POA: Diagnosis not present

## 2019-10-27 DIAGNOSIS — S92221D Displaced fracture of lateral cuneiform of right foot, subsequent encounter for fracture with routine healing: Secondary | ICD-10-CM | POA: Diagnosis not present

## 2019-10-27 DIAGNOSIS — S92211D Displaced fracture of cuboid bone of right foot, subsequent encounter for fracture with routine healing: Secondary | ICD-10-CM | POA: Diagnosis not present

## 2019-11-11 DIAGNOSIS — M546 Pain in thoracic spine: Secondary | ICD-10-CM | POA: Diagnosis not present

## 2019-11-24 MED FILL — REPATHA SURECLICK 140 MG/ML: 140 | 28 days supply | Qty: 2 | Fill #0

## 2019-12-21 MED FILL — REPATHA SURECLICK 140 MG/ML: 140 | 28 days supply | Qty: 2 | Fill #1

## 2020-01-04 DIAGNOSIS — E113413 Type 2 diabetes mellitus with severe nonproliferative diabetic retinopathy with macular edema, bilateral: Secondary | ICD-10-CM | POA: Diagnosis not present

## 2020-01-12 DIAGNOSIS — E1139 Type 2 diabetes mellitus with other diabetic ophthalmic complication: Secondary | ICD-10-CM | POA: Diagnosis not present

## 2020-01-12 DIAGNOSIS — S92221D Displaced fracture of lateral cuneiform of right foot, subsequent encounter for fracture with routine healing: Secondary | ICD-10-CM | POA: Diagnosis not present

## 2020-01-12 DIAGNOSIS — Q6672 Congenital pes cavus, left foot: Secondary | ICD-10-CM | POA: Diagnosis not present

## 2020-01-12 DIAGNOSIS — Q6671 Congenital pes cavus, right foot: Secondary | ICD-10-CM | POA: Diagnosis not present

## 2020-01-12 DIAGNOSIS — S92211D Displaced fracture of cuboid bone of right foot, subsequent encounter for fracture with routine healing: Secondary | ICD-10-CM | POA: Diagnosis not present

## 2020-01-12 DIAGNOSIS — X58XXXD Exposure to other specified factors, subsequent encounter: Secondary | ICD-10-CM | POA: Diagnosis not present

## 2020-01-18 DIAGNOSIS — H18411 Arcus senilis, right eye: Secondary | ICD-10-CM | POA: Diagnosis not present

## 2020-01-18 DIAGNOSIS — Z961 Presence of intraocular lens: Secondary | ICD-10-CM | POA: Diagnosis not present

## 2020-01-18 DIAGNOSIS — E113219 Type 2 diabetes mellitus with mild nonproliferative diabetic retinopathy with macular edema, unspecified eye: Secondary | ICD-10-CM | POA: Diagnosis not present

## 2020-01-18 DIAGNOSIS — E113593 Type 2 diabetes mellitus with proliferative diabetic retinopathy without macular edema, bilateral: Secondary | ICD-10-CM | POA: Diagnosis not present

## 2020-01-21 ENCOUNTER — Encounter

## 2020-01-22 NOTE — Telephone Encounter (Signed)
Request for refill denied. Patient needs to be seen for follow up.    Raevin Wierenga A. Pleas [facility], MPA, PA-C  01/22/2020  3:22 PM

## 2020-01-28 DIAGNOSIS — E1165 Type 2 diabetes mellitus with hyperglycemia: Secondary | ICD-10-CM | POA: Diagnosis not present

## 2020-01-28 DIAGNOSIS — Z125 Encounter for screening for malignant neoplasm of prostate: Secondary | ICD-10-CM | POA: Diagnosis not present

## 2020-01-28 DIAGNOSIS — E782 Mixed hyperlipidemia: Secondary | ICD-10-CM | POA: Diagnosis not present

## 2020-01-28 DIAGNOSIS — Q6671 Congenital pes cavus, right foot: Secondary | ICD-10-CM | POA: Diagnosis not present

## 2020-01-28 DIAGNOSIS — I10 Essential (primary) hypertension: Secondary | ICD-10-CM | POA: Diagnosis not present

## 2020-01-28 DIAGNOSIS — Q6672 Congenital pes cavus, left foot: Secondary | ICD-10-CM | POA: Diagnosis not present

## 2020-01-28 DIAGNOSIS — E1139 Type 2 diabetes mellitus with other diabetic ophthalmic complication: Secondary | ICD-10-CM | POA: Diagnosis not present

## 2020-01-28 DIAGNOSIS — E11319 Type 2 diabetes mellitus with unspecified diabetic retinopathy without macular edema: Secondary | ICD-10-CM | POA: Diagnosis not present

## 2020-01-28 DIAGNOSIS — Z794 Long term (current) use of insulin: Secondary | ICD-10-CM | POA: Diagnosis not present

## 2020-02-01 DIAGNOSIS — E113593 Type 2 diabetes mellitus with proliferative diabetic retinopathy without macular edema, bilateral: Secondary | ICD-10-CM | POA: Diagnosis not present

## 2020-02-01 DIAGNOSIS — Z7984 Long term (current) use of oral hypoglycemic drugs: Secondary | ICD-10-CM | POA: Diagnosis not present

## 2020-02-22 DIAGNOSIS — E1141 Type 2 diabetes mellitus with diabetic mononeuropathy: Secondary | ICD-10-CM | POA: Diagnosis not present

## 2020-03-10 DIAGNOSIS — G5793 Unspecified mononeuropathy of bilateral lower limbs: Secondary | ICD-10-CM | POA: Diagnosis not present

## 2020-04-15 ENCOUNTER — Other Ambulatory Visit: Payer: Self-pay | Admitting: Neurology

## 2020-04-15 DIAGNOSIS — R252 Cramp and spasm: Secondary | ICD-10-CM | POA: Diagnosis not present

## 2020-04-15 DIAGNOSIS — G43709 Chronic migraine without aura, not intractable, without status migrainosus: Secondary | ICD-10-CM | POA: Diagnosis not present

## 2020-04-15 DIAGNOSIS — G2581 Restless legs syndrome: Secondary | ICD-10-CM | POA: Diagnosis not present

## 2020-04-20 ENCOUNTER — Other Ambulatory Visit: Payer: Self-pay | Admitting: Student

## 2020-05-02 DIAGNOSIS — E113412 Type 2 diabetes mellitus with severe nonproliferative diabetic retinopathy with macular edema, left eye: Secondary | ICD-10-CM | POA: Diagnosis not present

## 2020-05-02 DIAGNOSIS — E113511 Type 2 diabetes mellitus with proliferative diabetic retinopathy with macular edema, right eye: Secondary | ICD-10-CM | POA: Diagnosis not present

## 2020-05-09 DIAGNOSIS — E1141 Type 2 diabetes mellitus with diabetic mononeuropathy: Secondary | ICD-10-CM | POA: Diagnosis not present

## 2020-05-19 DIAGNOSIS — I119 Hypertensive heart disease without heart failure: Secondary | ICD-10-CM | POA: Diagnosis not present

## 2020-05-19 DIAGNOSIS — R0602 Shortness of breath: Secondary | ICD-10-CM | POA: Diagnosis not present

## 2020-05-19 DIAGNOSIS — R079 Chest pain, unspecified: Secondary | ICD-10-CM | POA: Diagnosis not present

## 2020-05-19 DIAGNOSIS — Z20822 Contact with and (suspected) exposure to covid-19: Secondary | ICD-10-CM | POA: Diagnosis not present

## 2020-05-19 DIAGNOSIS — R531 Weakness: Secondary | ICD-10-CM | POA: Diagnosis not present

## 2020-05-19 DIAGNOSIS — R5383 Other fatigue: Secondary | ICD-10-CM | POA: Diagnosis not present

## 2020-05-19 DIAGNOSIS — R42 Dizziness and giddiness: Secondary | ICD-10-CM | POA: Diagnosis not present

## 2020-05-19 DIAGNOSIS — R61 Generalized hyperhidrosis: Secondary | ICD-10-CM | POA: Diagnosis not present

## 2020-05-19 DIAGNOSIS — M79602 Pain in left arm: Secondary | ICD-10-CM | POA: Diagnosis not present

## 2020-05-19 DIAGNOSIS — I252 Old myocardial infarction: Secondary | ICD-10-CM | POA: Diagnosis not present

## 2020-05-19 DIAGNOSIS — R0789 Other chest pain: Secondary | ICD-10-CM | POA: Diagnosis not present

## 2020-05-19 DIAGNOSIS — Z79899 Other long term (current) drug therapy: Secondary | ICD-10-CM | POA: Diagnosis not present

## 2020-05-24 DIAGNOSIS — I2510 Atherosclerotic heart disease of native coronary artery without angina pectoris: Secondary | ICD-10-CM | POA: Diagnosis not present

## 2020-05-24 DIAGNOSIS — Z951 Presence of aortocoronary bypass graft: Secondary | ICD-10-CM | POA: Diagnosis not present

## 2020-05-24 DIAGNOSIS — E1139 Type 2 diabetes mellitus with other diabetic ophthalmic complication: Secondary | ICD-10-CM | POA: Diagnosis not present

## 2020-05-24 DIAGNOSIS — E1165 Type 2 diabetes mellitus with hyperglycemia: Secondary | ICD-10-CM | POA: Diagnosis not present

## 2020-05-24 DIAGNOSIS — I10 Essential (primary) hypertension: Secondary | ICD-10-CM | POA: Diagnosis not present

## 2020-05-24 DIAGNOSIS — Z794 Long term (current) use of insulin: Secondary | ICD-10-CM | POA: Diagnosis not present

## 2020-05-24 DIAGNOSIS — R252 Cramp and spasm: Secondary | ICD-10-CM | POA: Diagnosis not present

## 2020-06-01 DIAGNOSIS — E11319 Type 2 diabetes mellitus with unspecified diabetic retinopathy without macular edema: Secondary | ICD-10-CM | POA: Diagnosis not present

## 2020-06-01 DIAGNOSIS — I10 Essential (primary) hypertension: Secondary | ICD-10-CM | POA: Diagnosis not present

## 2020-06-01 DIAGNOSIS — R7989 Other specified abnormal findings of blood chemistry: Secondary | ICD-10-CM | POA: Diagnosis not present

## 2020-06-01 DIAGNOSIS — R1010 Upper abdominal pain, unspecified: Secondary | ICD-10-CM | POA: Diagnosis not present

## 2020-06-02 ENCOUNTER — Other Ambulatory Visit: Payer: Self-pay | Admitting: Student

## 2020-06-02 DIAGNOSIS — R1010 Upper abdominal pain, unspecified: Secondary | ICD-10-CM

## 2020-06-02 DIAGNOSIS — R7989 Other specified abnormal findings of blood chemistry: Secondary | ICD-10-CM

## 2020-06-08 ENCOUNTER — Other Ambulatory Visit: Payer: Self-pay

## 2020-06-08 ENCOUNTER — Ambulatory Visit
Admission: RE | Admit: 2020-06-08 | Discharge: 2020-06-08 | Disposition: A | Discharge status: Home / Self Care | Payer: 59 | Source: Ambulatory Visit | Attending: Student | Admitting: Student

## 2020-06-08 DIAGNOSIS — R1010 Upper abdominal pain, unspecified: Secondary | ICD-10-CM | POA: Insufficient documentation

## 2020-06-08 DIAGNOSIS — K760 Fatty (change of) liver, not elsewhere classified: Secondary | ICD-10-CM | POA: Diagnosis not present

## 2020-06-08 DIAGNOSIS — R7989 Other specified abnormal findings of blood chemistry: Secondary | ICD-10-CM | POA: Diagnosis not present

## 2020-06-08 DIAGNOSIS — R161 Splenomegaly, not elsewhere classified: Secondary | ICD-10-CM | POA: Diagnosis not present

## 2020-06-08 IMAGING — US US ABDOMEN COMPLETE
1 series · 14 of 25 positions shown · non-contrast
Comparison: None.

CLINICAL DATA: Elevated LFTs.  Upper abdominal pain.

EXAM:
ABDOMEN ULTRASOUND COMPLETE

[Series 1: us abdomen complete · 14 of 98 slices shown]
[im 1/98]
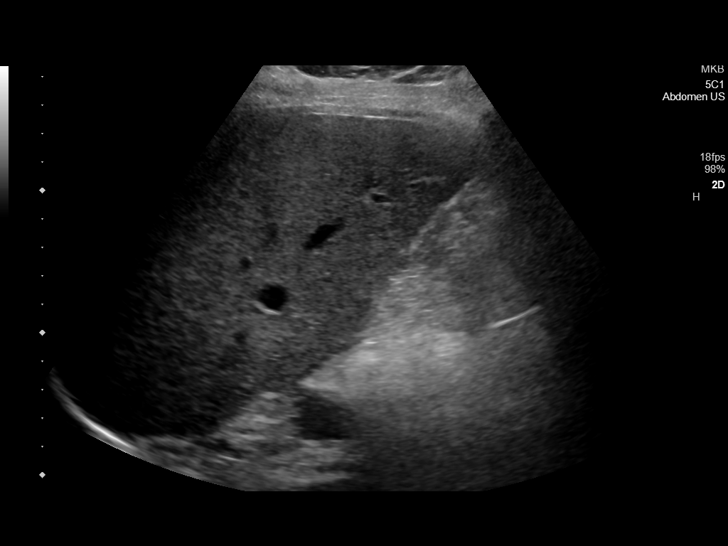
[im 9/98]
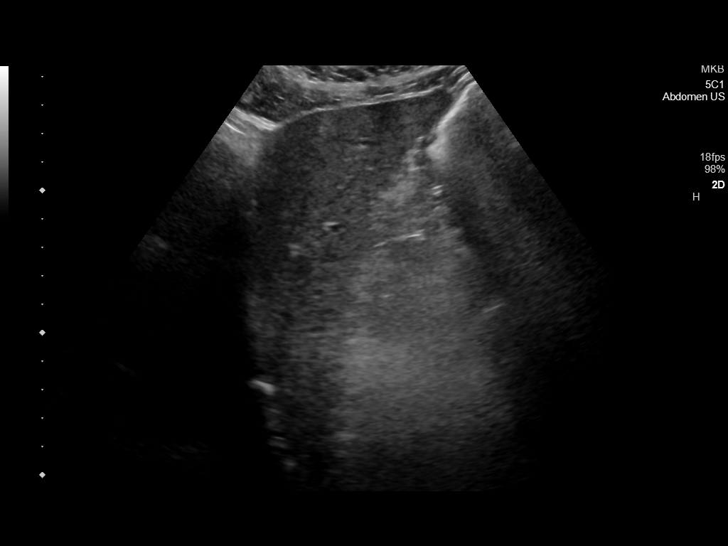
[im 17/98]
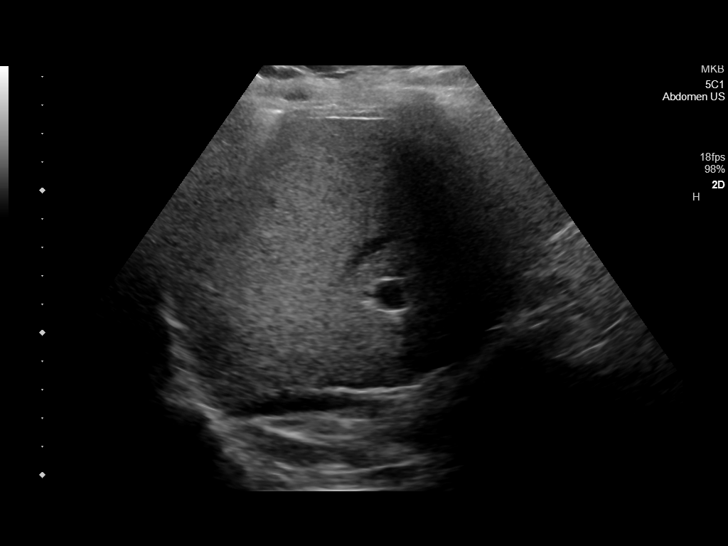
[im 25/98]
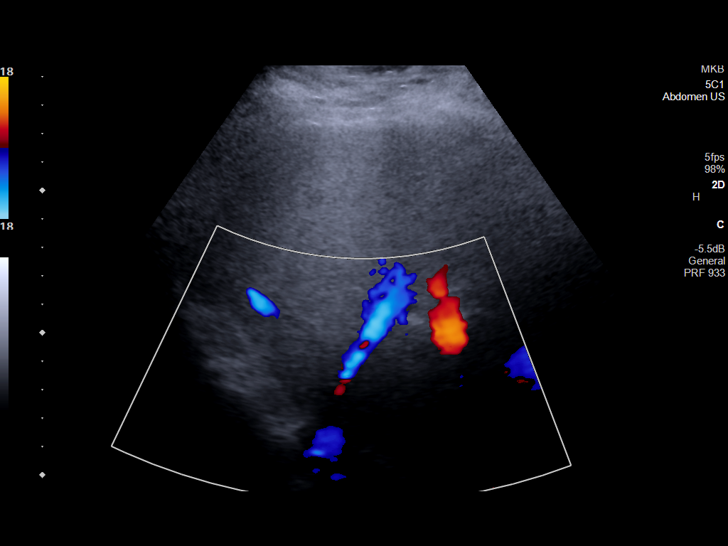
[im 33/98]
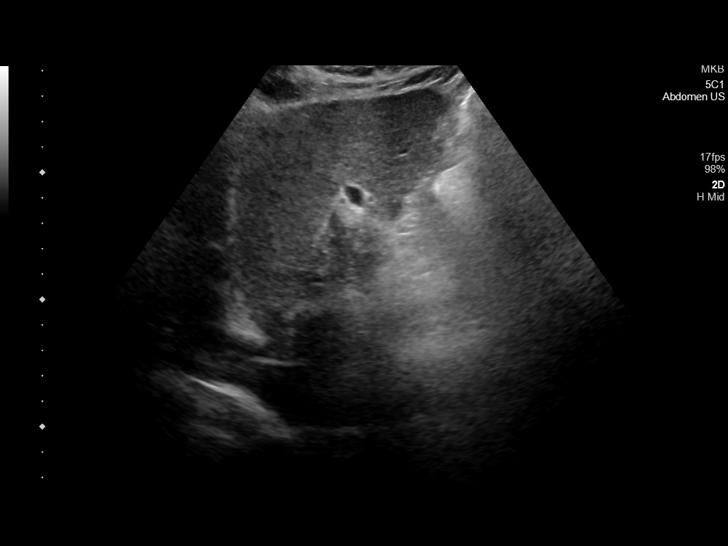
[im 37/98]
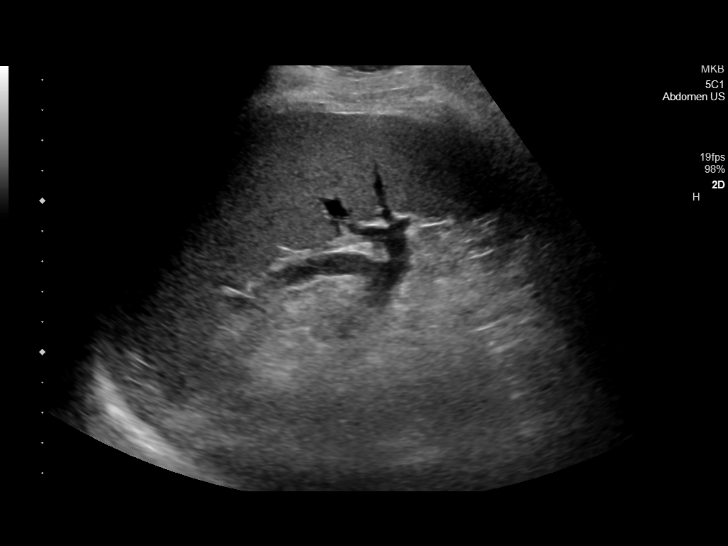
[im 45/98]
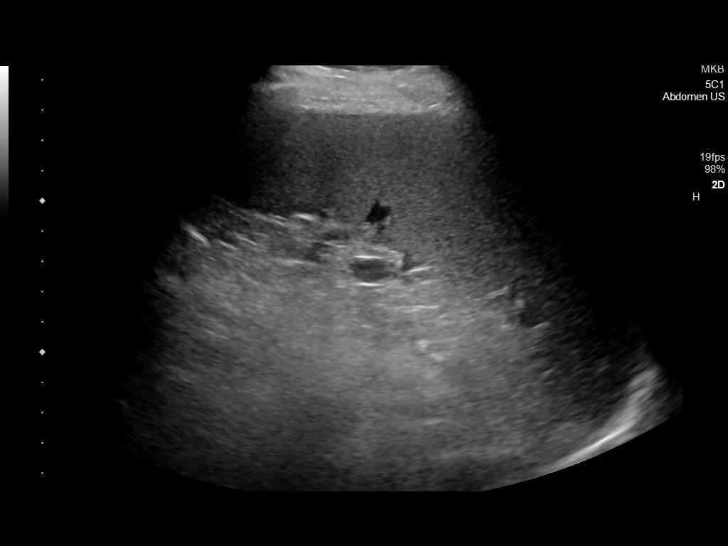
[im 53/98]
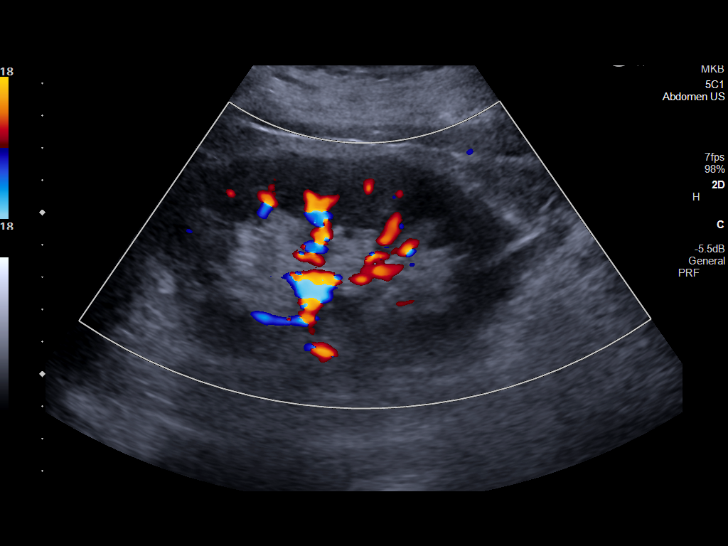
[im 61/98]
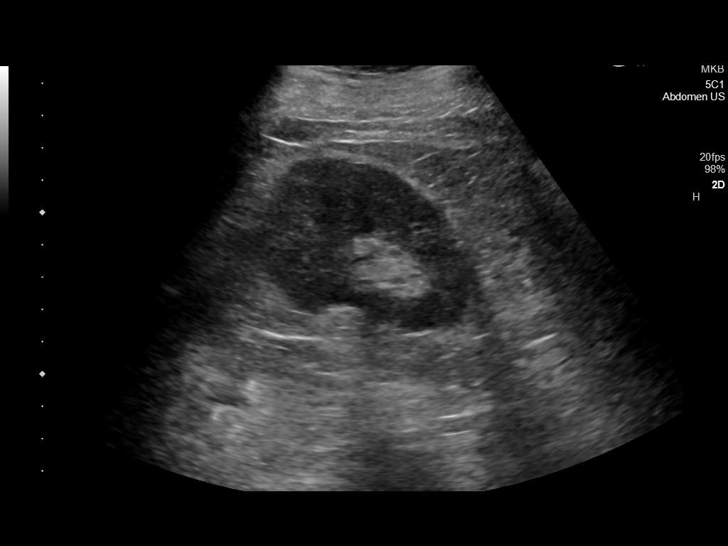
[im 65/98]
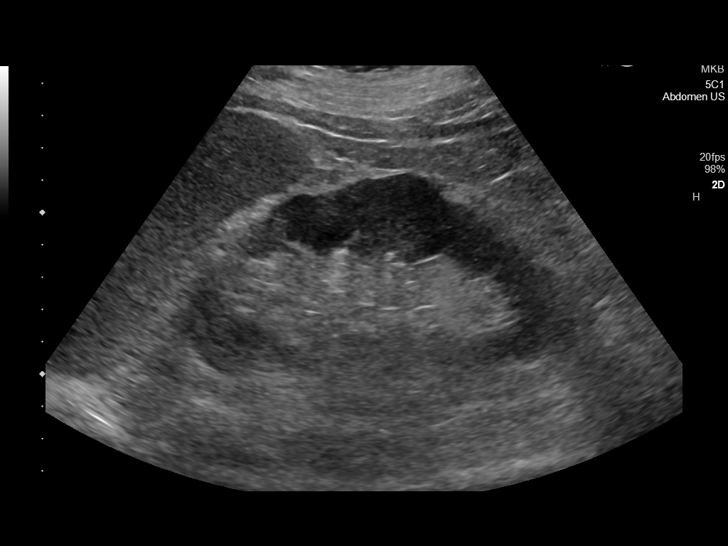
[im 73/98]
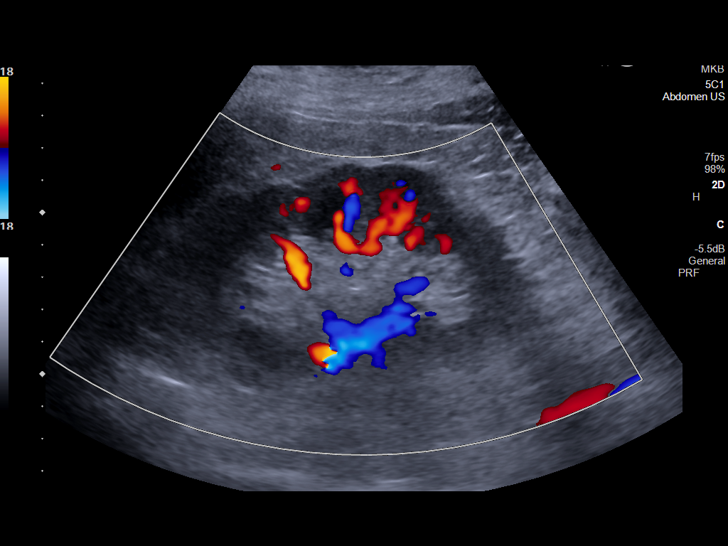
[im 81/98]
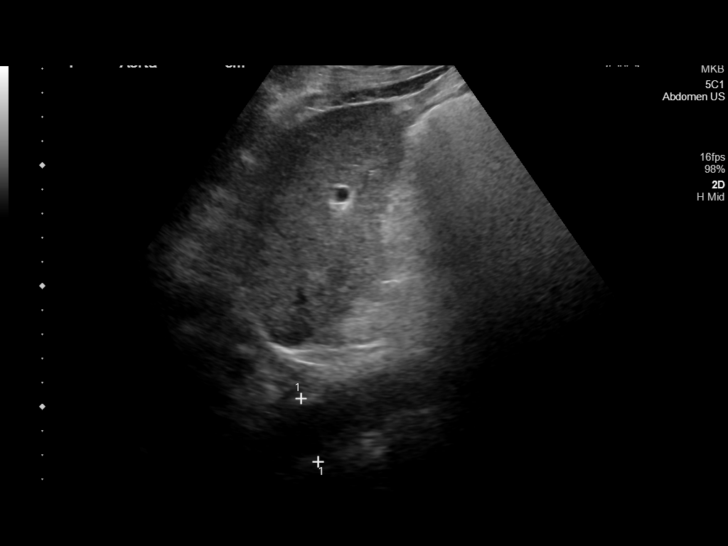
[im 89/98]
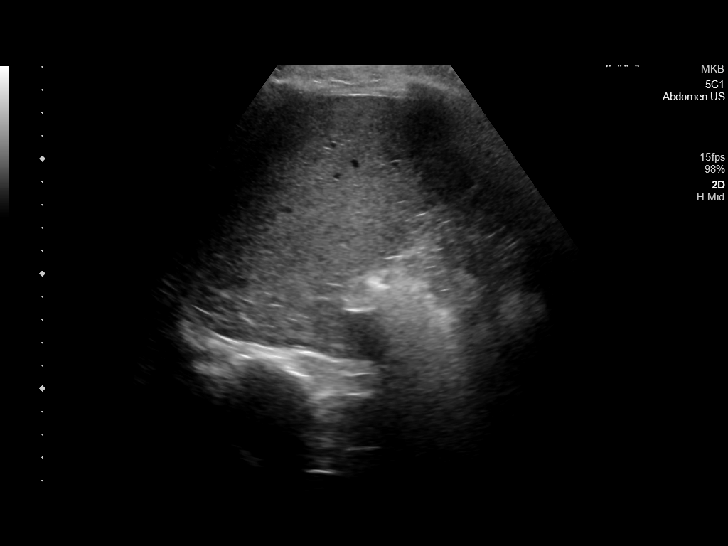
[im 98/98]
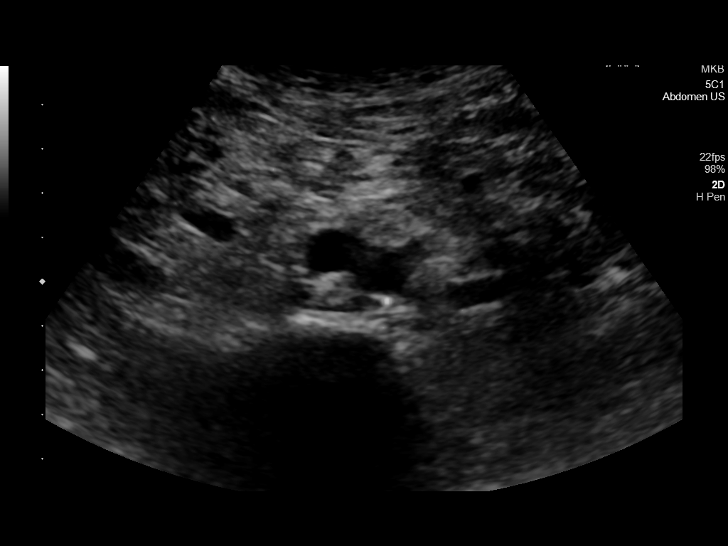

[14 of 25 positions shown; findings below may reference images not displayed]

FINDINGS: Gallbladder: Surgically absent.

Common bile duct: Diameter: 3-4 mm, normal.

Liver: No focal lesion identified. Heterogeneous and mildly
increased in parenchymal echogenicity. Portal vein is patent on
color Doppler imaging with normal direction of blood flow towards
the liver.

IVC: No abnormality visualized.

Pancreas: Obscured by bowel gas and not well visualized.

Spleen: Enlarged measuring 13.8 x 14.6 x 7.1 cm for volume of 745
cc. No focal abnormality.

Right Kidney: Length: 11.3 cm. Echogenicity within normal limits. No
mass or hydronephrosis visualized.

Left Kidney: Length: 11.4 cm. Echogenic shadowing structure may
represent a nonobstructing stone, 11 mm in the central kidney.
Echogenicity within normal limits. No mass or hydronephrosis
visualized.

Abdominal aorta: No aneurysm visualized.

Other findings: No ascites or free fluid.
IMPRESSION: 1. Post cholecystectomy without biliary dilatation.
2. Mild hepatic steatosis.
3. Splenomegaly.
4. Probable nonobstructing left renal stone.

## 2020-06-16 DIAGNOSIS — R350 Frequency of micturition: Secondary | ICD-10-CM | POA: Diagnosis not present

## 2020-06-16 DIAGNOSIS — R7989 Other specified abnormal findings of blood chemistry: Secondary | ICD-10-CM | POA: Diagnosis not present

## 2020-06-21 ENCOUNTER — Other Ambulatory Visit: Payer: Self-pay | Admitting: Student

## 2020-06-28 DIAGNOSIS — R7989 Other specified abnormal findings of blood chemistry: Secondary | ICD-10-CM | POA: Diagnosis not present

## 2020-06-28 DIAGNOSIS — R140 Abdominal distension (gaseous): Secondary | ICD-10-CM | POA: Diagnosis not present

## 2020-06-28 DIAGNOSIS — K760 Fatty (change of) liver, not elsewhere classified: Secondary | ICD-10-CM | POA: Diagnosis not present

## 2020-06-28 DIAGNOSIS — R162 Hepatomegaly with splenomegaly, not elsewhere classified: Secondary | ICD-10-CM | POA: Insufficient documentation

## 2020-06-28 DIAGNOSIS — E278 Other specified disorders of adrenal gland: Secondary | ICD-10-CM | POA: Insufficient documentation

## 2020-06-28 DIAGNOSIS — R1011 Right upper quadrant pain: Secondary | ICD-10-CM | POA: Diagnosis not present

## 2020-06-28 DIAGNOSIS — R195 Other fecal abnormalities: Secondary | ICD-10-CM | POA: Diagnosis not present

## 2020-07-01 ENCOUNTER — Other Ambulatory Visit: Payer: Self-pay | Admitting: Student

## 2020-07-01 DIAGNOSIS — E278 Other specified disorders of adrenal gland: Secondary | ICD-10-CM

## 2020-07-07 DIAGNOSIS — W57XXXA Bitten or stung by nonvenomous insect and other nonvenomous arthropods, initial encounter: Secondary | ICD-10-CM | POA: Diagnosis not present

## 2020-07-07 DIAGNOSIS — S20469A Insect bite (nonvenomous) of unspecified back wall of thorax, initial encounter: Secondary | ICD-10-CM | POA: Diagnosis not present

## 2020-07-15 DIAGNOSIS — R0602 Shortness of breath: Secondary | ICD-10-CM

## 2020-07-15 DIAGNOSIS — I10 Essential (primary) hypertension: Secondary | ICD-10-CM | POA: Diagnosis not present

## 2020-07-15 DIAGNOSIS — E782 Mixed hyperlipidemia: Secondary | ICD-10-CM | POA: Diagnosis not present

## 2020-07-15 DIAGNOSIS — Z951 Presence of aortocoronary bypass graft: Secondary | ICD-10-CM | POA: Diagnosis not present

## 2020-07-15 DIAGNOSIS — Z794 Long term (current) use of insulin: Secondary | ICD-10-CM | POA: Diagnosis not present

## 2020-07-15 DIAGNOSIS — I2510 Atherosclerotic heart disease of native coronary artery without angina pectoris: Secondary | ICD-10-CM | POA: Diagnosis not present

## 2020-07-15 DIAGNOSIS — E1139 Type 2 diabetes mellitus with other diabetic ophthalmic complication: Secondary | ICD-10-CM | POA: Diagnosis not present

## 2020-07-15 DIAGNOSIS — E1165 Type 2 diabetes mellitus with hyperglycemia: Secondary | ICD-10-CM | POA: Diagnosis not present

## 2020-07-15 HISTORY — DX: Shortness of breath: R06.02

## 2020-07-18 ENCOUNTER — Ambulatory Visit
Admission: RE | Admit: 2020-07-18 | Discharge: 2020-07-18 | Disposition: A | Discharge status: Home / Self Care | Payer: 59 | Source: Ambulatory Visit | Attending: Student | Admitting: Student

## 2020-07-18 ENCOUNTER — Other Ambulatory Visit: Payer: Self-pay

## 2020-07-18 DIAGNOSIS — E278 Other specified disorders of adrenal gland: Secondary | ICD-10-CM

## 2020-07-21 DIAGNOSIS — E1165 Type 2 diabetes mellitus with hyperglycemia: Secondary | ICD-10-CM | POA: Diagnosis not present

## 2020-07-21 DIAGNOSIS — R0602 Shortness of breath: Secondary | ICD-10-CM | POA: Diagnosis not present

## 2020-07-21 DIAGNOSIS — I2510 Atherosclerotic heart disease of native coronary artery without angina pectoris: Secondary | ICD-10-CM | POA: Diagnosis not present

## 2020-07-21 DIAGNOSIS — I10 Essential (primary) hypertension: Secondary | ICD-10-CM | POA: Diagnosis not present

## 2020-07-21 DIAGNOSIS — Z951 Presence of aortocoronary bypass graft: Secondary | ICD-10-CM | POA: Diagnosis not present

## 2020-07-21 DIAGNOSIS — E1139 Type 2 diabetes mellitus with other diabetic ophthalmic complication: Secondary | ICD-10-CM | POA: Diagnosis not present

## 2020-07-21 DIAGNOSIS — Z794 Long term (current) use of insulin: Secondary | ICD-10-CM | POA: Diagnosis not present

## 2020-07-21 DIAGNOSIS — R0600 Dyspnea, unspecified: Secondary | ICD-10-CM | POA: Diagnosis not present

## 2020-07-21 DIAGNOSIS — E782 Mixed hyperlipidemia: Secondary | ICD-10-CM | POA: Diagnosis not present

## 2020-07-22 DIAGNOSIS — I2510 Atherosclerotic heart disease of native coronary artery without angina pectoris: Secondary | ICD-10-CM | POA: Diagnosis not present

## 2020-07-22 DIAGNOSIS — E782 Mixed hyperlipidemia: Secondary | ICD-10-CM | POA: Diagnosis not present

## 2020-07-22 DIAGNOSIS — E1139 Type 2 diabetes mellitus with other diabetic ophthalmic complication: Secondary | ICD-10-CM | POA: Diagnosis not present

## 2020-07-22 DIAGNOSIS — I10 Essential (primary) hypertension: Secondary | ICD-10-CM | POA: Diagnosis not present

## 2020-07-22 DIAGNOSIS — R0602 Shortness of breath: Secondary | ICD-10-CM | POA: Diagnosis not present

## 2020-07-22 DIAGNOSIS — E1165 Type 2 diabetes mellitus with hyperglycemia: Secondary | ICD-10-CM | POA: Diagnosis not present

## 2020-07-22 DIAGNOSIS — Z951 Presence of aortocoronary bypass graft: Secondary | ICD-10-CM | POA: Diagnosis not present

## 2020-07-22 DIAGNOSIS — Z794 Long term (current) use of insulin: Secondary | ICD-10-CM | POA: Diagnosis not present

## 2020-07-28 ENCOUNTER — Ambulatory Visit
Admission: RE | Admit: 2020-07-28 | Discharge: 2020-07-28 | Disposition: A | Discharge status: Home / Self Care | Payer: 59 | Source: Ambulatory Visit | Attending: Student | Admitting: Student

## 2020-07-28 ENCOUNTER — Other Ambulatory Visit: Payer: Self-pay | Admitting: Student

## 2020-07-28 ENCOUNTER — Other Ambulatory Visit: Payer: Self-pay

## 2020-07-28 DIAGNOSIS — E278 Other specified disorders of adrenal gland: Secondary | ICD-10-CM | POA: Diagnosis not present

## 2020-07-28 DIAGNOSIS — K7460 Unspecified cirrhosis of liver: Secondary | ICD-10-CM | POA: Diagnosis not present

## 2020-07-28 DIAGNOSIS — I10 Essential (primary) hypertension: Secondary | ICD-10-CM | POA: Diagnosis not present

## 2020-07-28 DIAGNOSIS — K766 Portal hypertension: Secondary | ICD-10-CM | POA: Diagnosis not present

## 2020-07-28 DIAGNOSIS — N200 Calculus of kidney: Secondary | ICD-10-CM | POA: Diagnosis not present

## 2020-07-28 IMAGING — CT CT ABD-PELV W/O CM
2 of 4 series · 15 of 46 positions shown, 17 images · non-contrast
Comparison: No available imaging. Report from [DATE] is
available on [REDACTED].

CLINICAL DATA: Adrenal lesion seen on prior CT, previous CT not
available for review.

EXAM:
CT ABDOMEN AND PELVIS WITHOUT CONTRAST
TECHNIQUE: Multidetector CT imaging of the abdomen and pelvis was performed
following the standard protocol without IV contrast.

[Series 2: axial st · axial · 0.80mm/px · z∈[-980,-500]mm · 12 of 176 slices shown, 14 images]
[im 8/176  soft-tissue]
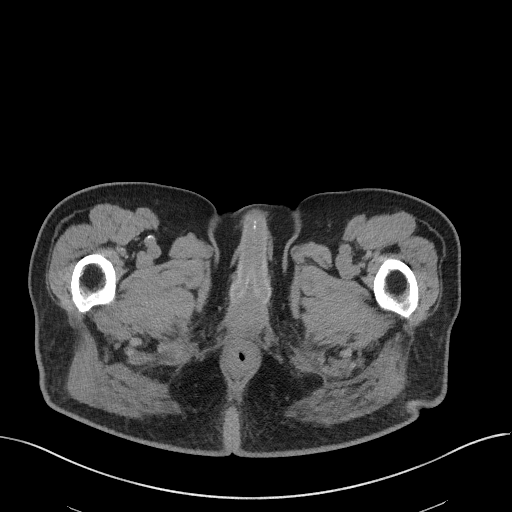
[im 8/176  bone]
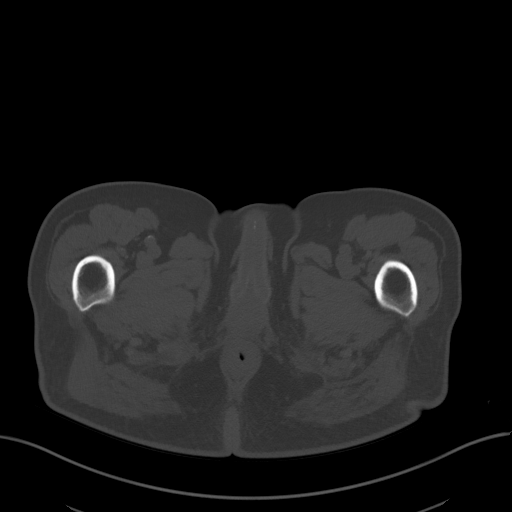
[im 22/176  soft-tissue]
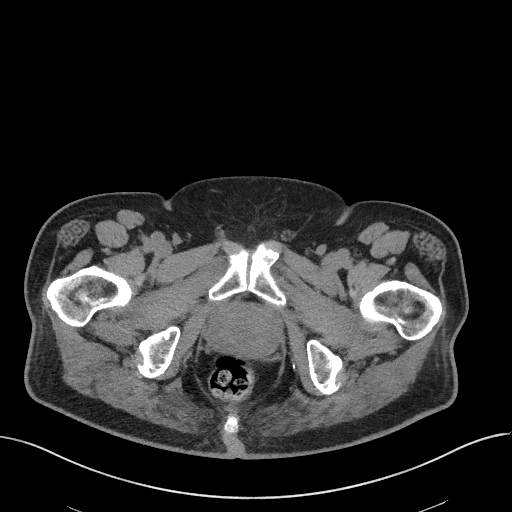
[im 37/176  soft-tissue]
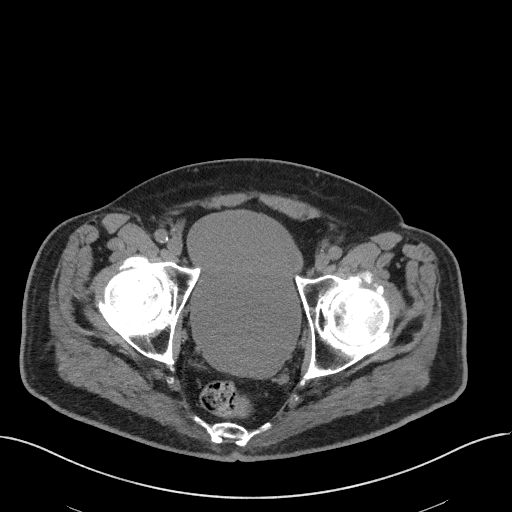
[im 52/176  soft-tissue]
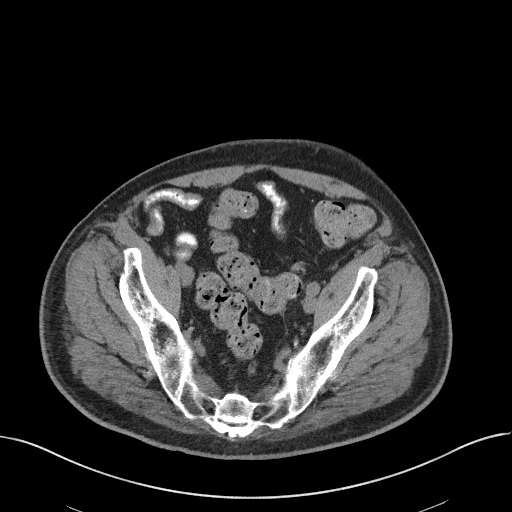
[im 66/176  soft-tissue]
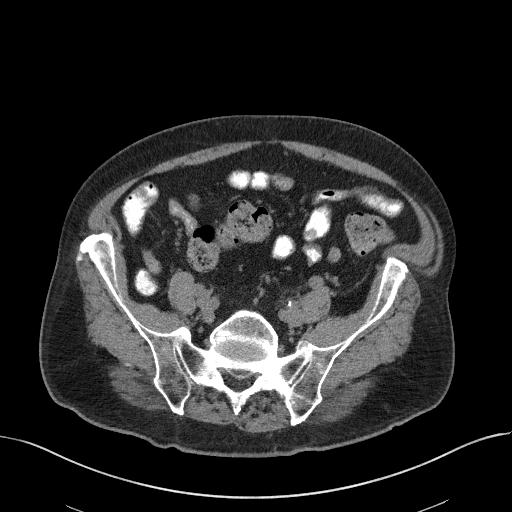
[im 81/176  soft-tissue]
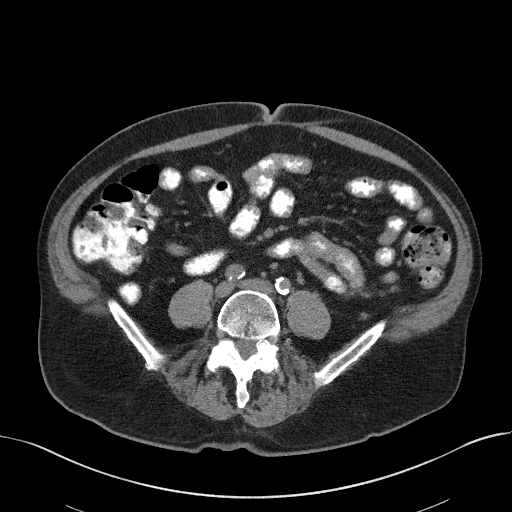
[im 95/176  soft-tissue]
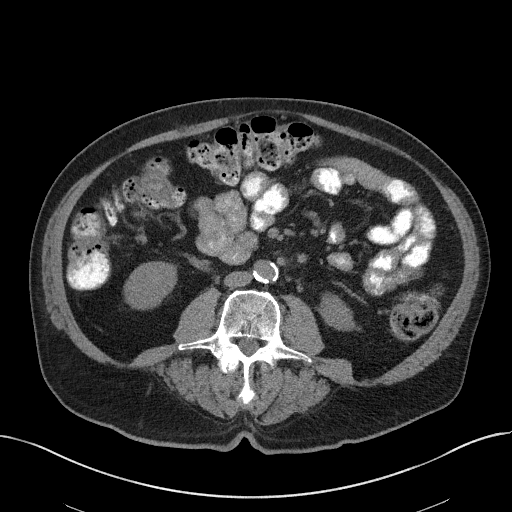
[im 110/176  soft-tissue]
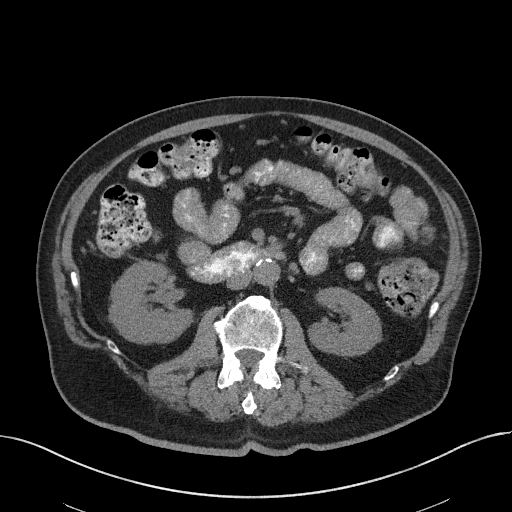
[im 124/176  soft-tissue]
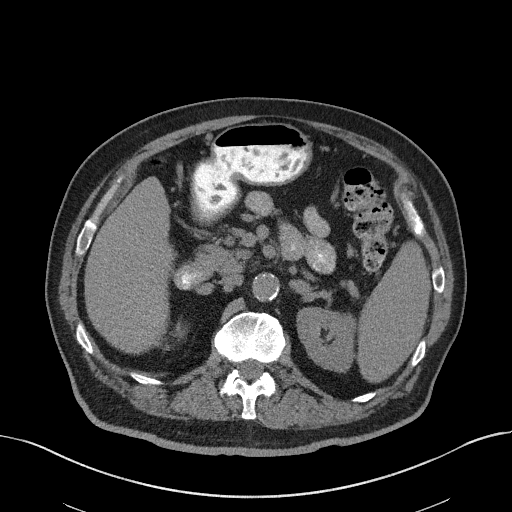
[im 124/176  bone]
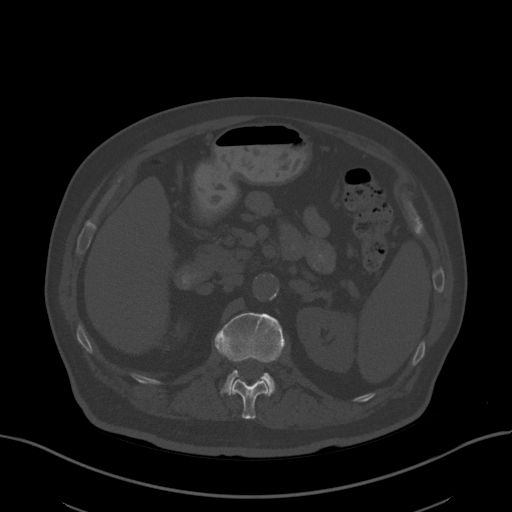
[im 139/176  soft-tissue]
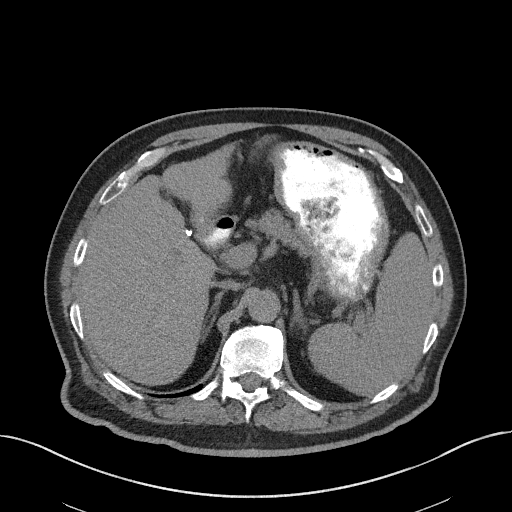
[im 154/176  soft-tissue]
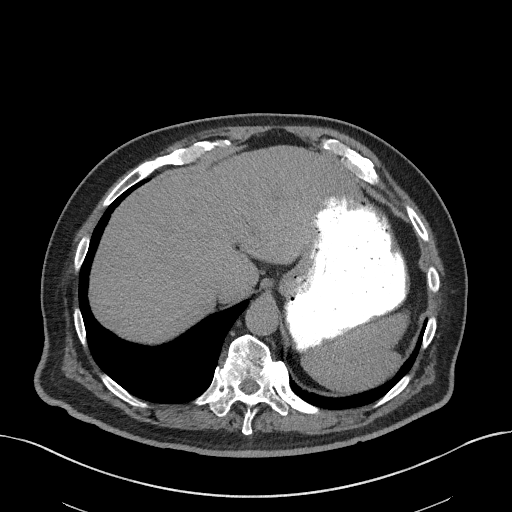
[im 168/176  soft-tissue]
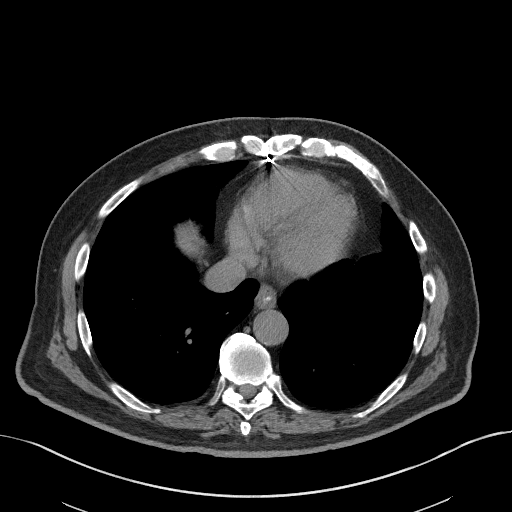

[Series 5: coronal wo · coronal · 0.84mm/px · 3 of 153 slices shown]
[im 51/153  soft-tissue]
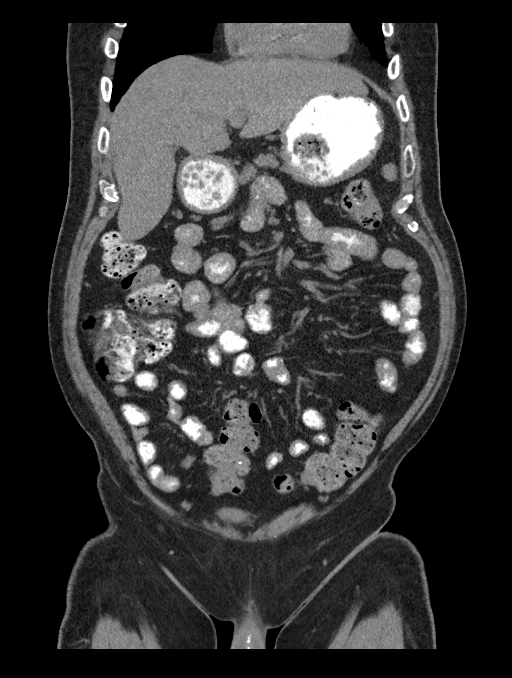
[im 68/153  soft-tissue]
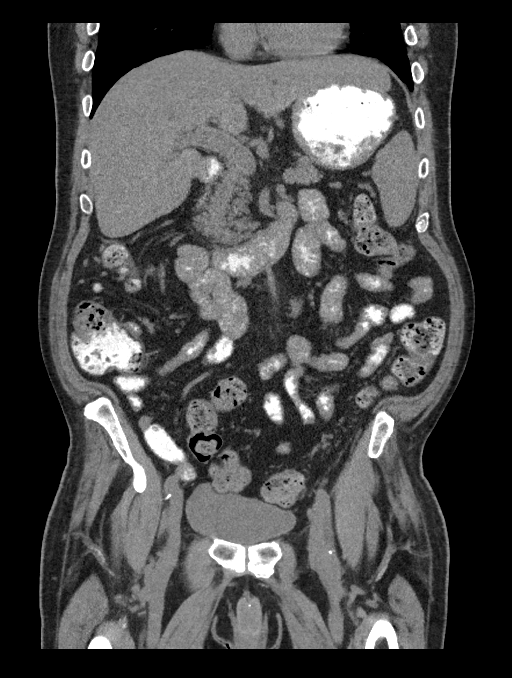
[im 85/153  soft-tissue]
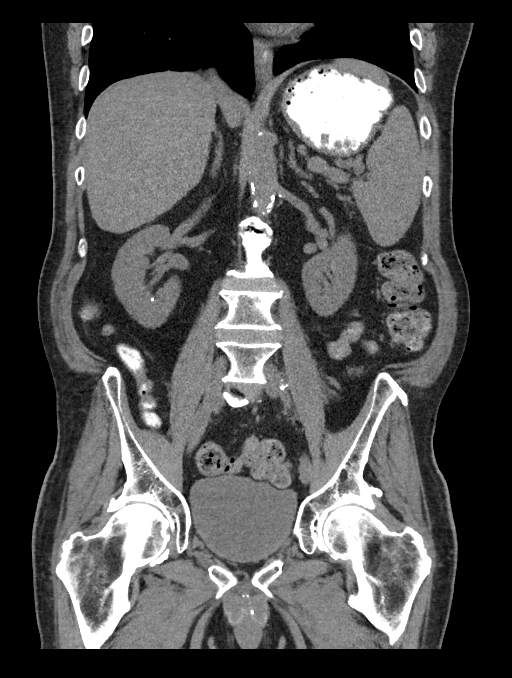

[15 of 46 positions shown; findings below may reference images not displayed]

FINDINGS: Lower chest: Basilar atelectasis. No effusion. No consolidation. No
pericardial fluid. Signs of median sternotomy. Partially imaged.

Hepatobiliary: Cirrhotic liver. Post cholecystectomy. No gross
biliary duct dilation.

Pancreas: Pancreas with normal contour. No peripancreatic
inflammation or gross ductal dilation.

Spleen: Spleen is enlarged in this patient with presumed portal
hypertension in the setting of cirrhosis measuring 14 cm greatest
craniocaudal dimension.

Adrenals/Urinary Tract: Low-density adrenal thickening versus small
adrenal lesion measuring 10 mm greatest thickness on image 36 of
series 2 on the LEFT with -3 Hounsfield unit density compatible with
either hyperplasia or small adenoma. RIGHT adrenal is normal.

Nephrolithiasis on the RIGHT with punctate calculus approximately 2
mm in the lower pole of the RIGHT kidney. No ureteral calculi.
Urinary bladder moderately distended. No hydronephrosis. Mild
cortical scarring of bilateral kidneys.

Stomach/Bowel: Stomach distended with ingested contrast material.
Contrast passes into the distal small bowel and in the colon. There
is no acute bowel process. Normal appendix. Stool fills much of the
colon.

Vascular/Lymphatic: Calcified atheromatous plaque of the abdominal
aorta without aneurysmal dilation. No pelvic lymphadenopathy. No
retroperitoneal adenopathy. No upper abdominal adenopathy.

Reproductive: Prostate heterogeneous appearance.  Nonspecific on CT.

Other: No ascites. Portosystemic collaterals present throughout the
abdomen with recanalized, early recanalized umbilical vein.

Musculoskeletal: Spinal degenerative changes without acute or
destructive bone process. Degenerative changes in the hips
bilaterally.
IMPRESSION: 1. Low-density adrenal thickening versus small adrenal lesion
measuring 10 mm greatest thickness on the LEFT with -3 Hounsfield
unit density compatible with either hyperplasia or small adenoma.
2. Cirrhotic liver with portal hypertension.
3. Post cholecystectomy.
4. Nephrolithiasis on the RIGHT with punctate calculus in the lower
pole of the RIGHT kidney.
5. Aortic atherosclerosis.

Aortic Atherosclerosis ([5Q]-[5Q]).

## 2020-07-28 MED ORDER — IOHEXOL 300 MG/ML  SOLN: 100.0000 mL | Freq: Once | INTRAMUSCULAR | Status: DC | PRN

## 2020-08-10 DIAGNOSIS — E1141 Type 2 diabetes mellitus with diabetic mononeuropathy: Secondary | ICD-10-CM | POA: Diagnosis not present

## 2020-08-10 DIAGNOSIS — E11319 Type 2 diabetes mellitus with unspecified diabetic retinopathy without macular edema: Secondary | ICD-10-CM | POA: Diagnosis not present

## 2020-08-10 DIAGNOSIS — R109 Unspecified abdominal pain: Secondary | ICD-10-CM | POA: Diagnosis not present

## 2020-08-10 DIAGNOSIS — E278 Other specified disorders of adrenal gland: Secondary | ICD-10-CM | POA: Diagnosis not present

## 2020-08-10 DIAGNOSIS — Z0000 Encounter for general adult medical examination without abnormal findings: Secondary | ICD-10-CM | POA: Diagnosis not present

## 2020-08-10 DIAGNOSIS — Z23 Encounter for immunization: Secondary | ICD-10-CM | POA: Diagnosis not present

## 2020-08-10 DIAGNOSIS — Z1211 Encounter for screening for malignant neoplasm of colon: Secondary | ICD-10-CM | POA: Diagnosis not present

## 2020-08-10 DIAGNOSIS — R1010 Upper abdominal pain, unspecified: Secondary | ICD-10-CM | POA: Diagnosis not present

## 2020-08-10 DIAGNOSIS — N200 Calculus of kidney: Secondary | ICD-10-CM | POA: Diagnosis not present

## 2020-08-15 DIAGNOSIS — E113511 Type 2 diabetes mellitus with proliferative diabetic retinopathy with macular edema, right eye: Secondary | ICD-10-CM | POA: Diagnosis not present

## 2020-08-15 DIAGNOSIS — E113412 Type 2 diabetes mellitus with severe nonproliferative diabetic retinopathy with macular edema, left eye: Secondary | ICD-10-CM | POA: Diagnosis not present

## 2020-08-15 DIAGNOSIS — E11311 Type 2 diabetes mellitus with unspecified diabetic retinopathy with macular edema: Secondary | ICD-10-CM | POA: Diagnosis not present

## 2020-09-05 DIAGNOSIS — N200 Calculus of kidney: Secondary | ICD-10-CM | POA: Diagnosis not present

## 2020-09-05 DIAGNOSIS — E278 Other specified disorders of adrenal gland: Secondary | ICD-10-CM | POA: Diagnosis not present

## 2020-09-16 DIAGNOSIS — Z6825 Body mass index (BMI) 25.0-25.9, adult: Secondary | ICD-10-CM | POA: Diagnosis not present

## 2020-09-16 DIAGNOSIS — E279 Disorder of adrenal gland, unspecified: Secondary | ICD-10-CM | POA: Diagnosis not present

## 2020-09-16 DIAGNOSIS — S36119A Unspecified injury of liver, initial encounter: Secondary | ICD-10-CM | POA: Diagnosis not present

## 2020-09-16 DIAGNOSIS — K760 Fatty (change of) liver, not elsewhere classified: Secondary | ICD-10-CM | POA: Diagnosis not present

## 2020-09-16 DIAGNOSIS — R197 Diarrhea, unspecified: Secondary | ICD-10-CM | POA: Diagnosis not present

## 2020-09-16 DIAGNOSIS — R634 Abnormal weight loss: Secondary | ICD-10-CM | POA: Diagnosis not present

## 2020-09-16 DIAGNOSIS — R7989 Other specified abnormal findings of blood chemistry: Secondary | ICD-10-CM | POA: Diagnosis not present

## 2020-09-16 DIAGNOSIS — R162 Hepatomegaly with splenomegaly, not elsewhere classified: Secondary | ICD-10-CM | POA: Diagnosis not present

## 2020-09-16 DIAGNOSIS — D329 Benign neoplasm of meninges, unspecified: Secondary | ICD-10-CM | POA: Diagnosis not present

## 2020-09-29 ENCOUNTER — Other Ambulatory Visit: Payer: Self-pay | Admitting: Family Medicine

## 2020-09-29 DIAGNOSIS — E1139 Type 2 diabetes mellitus with other diabetic ophthalmic complication: Secondary | ICD-10-CM | POA: Diagnosis not present

## 2020-09-29 DIAGNOSIS — G4762 Sleep related leg cramps: Secondary | ICD-10-CM | POA: Insufficient documentation

## 2020-09-29 DIAGNOSIS — D329 Benign neoplasm of meninges, unspecified: Secondary | ICD-10-CM | POA: Diagnosis not present

## 2020-09-29 DIAGNOSIS — R634 Abnormal weight loss: Secondary | ICD-10-CM | POA: Insufficient documentation

## 2020-09-29 DIAGNOSIS — R162 Hepatomegaly with splenomegaly, not elsewhere classified: Secondary | ICD-10-CM | POA: Diagnosis not present

## 2020-09-29 DIAGNOSIS — M791 Myalgia, unspecified site: Secondary | ICD-10-CM | POA: Diagnosis not present

## 2020-09-29 DIAGNOSIS — H6993 Unspecified Eustachian tube disorder, bilateral: Secondary | ICD-10-CM

## 2020-09-29 DIAGNOSIS — H6983 Other specified disorders of Eustachian tube, bilateral: Secondary | ICD-10-CM | POA: Diagnosis not present

## 2020-09-29 DIAGNOSIS — K529 Noninfective gastroenteritis and colitis, unspecified: Secondary | ICD-10-CM | POA: Insufficient documentation

## 2020-09-29 DIAGNOSIS — R252 Cramp and spasm: Secondary | ICD-10-CM | POA: Diagnosis not present

## 2020-09-29 DIAGNOSIS — R519 Headache, unspecified: Secondary | ICD-10-CM | POA: Diagnosis not present

## 2020-09-29 DIAGNOSIS — I2510 Atherosclerotic heart disease of native coronary artery without angina pectoris: Secondary | ICD-10-CM | POA: Diagnosis not present

## 2020-09-29 DIAGNOSIS — I10 Essential (primary) hypertension: Secondary | ICD-10-CM | POA: Diagnosis not present

## 2020-09-29 DIAGNOSIS — D320 Benign neoplasm of cerebral meninges: Secondary | ICD-10-CM | POA: Diagnosis not present

## 2020-09-29 DIAGNOSIS — J45909 Unspecified asthma, uncomplicated: Secondary | ICD-10-CM | POA: Diagnosis not present

## 2020-09-29 DIAGNOSIS — Z23 Encounter for immunization: Secondary | ICD-10-CM | POA: Diagnosis not present

## 2020-09-29 DIAGNOSIS — E1165 Type 2 diabetes mellitus with hyperglycemia: Secondary | ICD-10-CM | POA: Diagnosis not present

## 2020-09-29 DIAGNOSIS — E785 Hyperlipidemia, unspecified: Secondary | ICD-10-CM | POA: Diagnosis not present

## 2020-09-29 HISTORY — DX: Unspecified eustachian tube disorder, bilateral: H69.93

## 2020-09-29 HISTORY — DX: Sleep related leg cramps: G47.62

## 2020-09-30 ENCOUNTER — Other Ambulatory Visit: Payer: Self-pay | Admitting: Nurse Practitioner

## 2020-10-20 ENCOUNTER — Other Ambulatory Visit: Payer: Self-pay | Admitting: Student

## 2020-10-26 ENCOUNTER — Other Ambulatory Visit: Payer: Self-pay | Admitting: Physical Medicine & Rehabilitation

## 2020-10-26 DIAGNOSIS — M5412 Radiculopathy, cervical region: Secondary | ICD-10-CM | POA: Diagnosis not present

## 2020-10-26 DIAGNOSIS — M542 Cervicalgia: Secondary | ICD-10-CM | POA: Insufficient documentation

## 2020-10-27 DIAGNOSIS — J392 Other diseases of pharynx: Secondary | ICD-10-CM | POA: Diagnosis not present

## 2020-10-27 DIAGNOSIS — K529 Noninfective gastroenteritis and colitis, unspecified: Secondary | ICD-10-CM | POA: Diagnosis not present

## 2020-10-27 DIAGNOSIS — I959 Hypotension, unspecified: Secondary | ICD-10-CM | POA: Diagnosis not present

## 2020-10-27 DIAGNOSIS — R252 Cramp and spasm: Secondary | ICD-10-CM | POA: Diagnosis not present

## 2020-11-09 ENCOUNTER — Other Ambulatory Visit: Payer: Self-pay

## 2020-11-09 ENCOUNTER — Ambulatory Visit
Admission: RE | Admit: 2020-11-09 | Discharge: 2020-11-09 | Disposition: A | Discharge status: Home / Self Care | Payer: 59 | Source: Ambulatory Visit | Attending: Physical Medicine & Rehabilitation | Admitting: Physical Medicine & Rehabilitation

## 2020-11-09 DIAGNOSIS — M50122 Cervical disc disorder at C5-C6 level with radiculopathy: Secondary | ICD-10-CM | POA: Diagnosis not present

## 2020-11-09 DIAGNOSIS — M5412 Radiculopathy, cervical region: Secondary | ICD-10-CM | POA: Diagnosis not present

## 2020-11-09 DIAGNOSIS — M50121 Cervical disc disorder at C4-C5 level with radiculopathy: Secondary | ICD-10-CM | POA: Diagnosis not present

## 2020-11-09 DIAGNOSIS — M50123 Cervical disc disorder at C6-C7 level with radiculopathy: Secondary | ICD-10-CM | POA: Diagnosis not present

## 2020-11-09 DIAGNOSIS — M4722 Other spondylosis with radiculopathy, cervical region: Secondary | ICD-10-CM | POA: Diagnosis not present

## 2020-11-09 IMAGING — MR MR CERVICAL SPINE W/O CM
5 series · 41 of 48 positions shown · non-contrast
Comparison: None.

CLINICAL DATA: Cervical radiculopathy.

EXAM:
MRI CERVICAL SPINE WITHOUT CONTRAST
TECHNIQUE: Multiplanar, multisequence MR imaging of the cervical spine was
performed. No intravenous contrast was administered.

[Series 5: T2 · sagittal · 3.0mm · 0.62mm/px · 6 of 15 slices shown (1 of 2)]
[im 1/15]
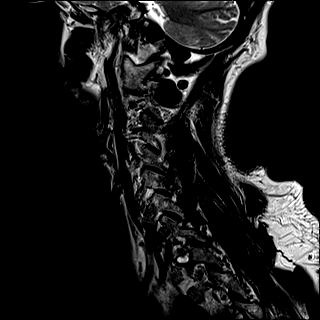
[im 3/15]
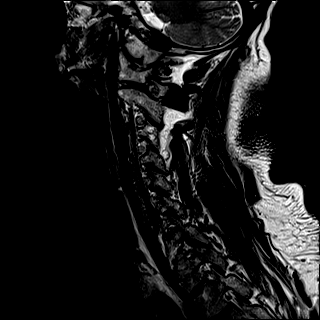
[im 6/15]
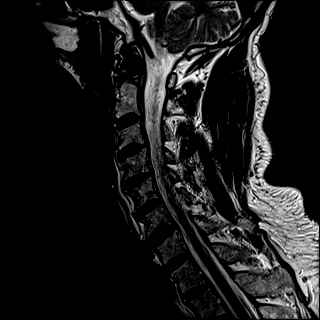
[im 9/15]
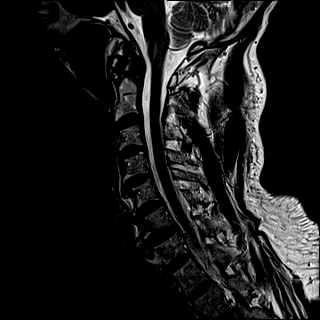
[im 12/15]
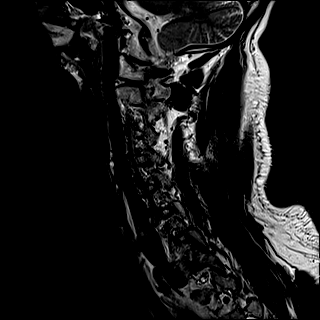
[im 15/15]
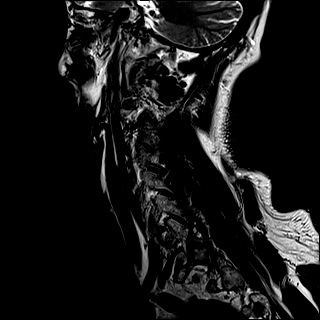

[Series 6: FLAIR · sagittal · 3.0mm · 0.78mm/px · 7 of 15 slices shown]
[im 1/15]
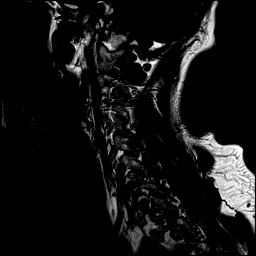
[im 3/15]
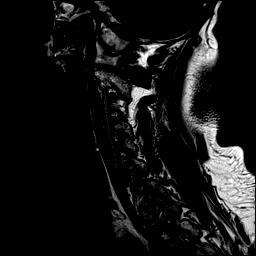
[im 5/15]
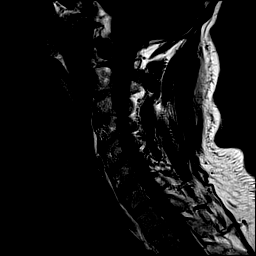
[im 8/15]
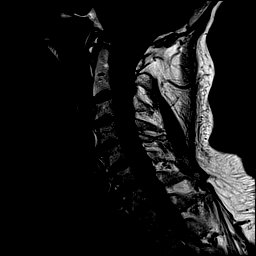
[im 10/15]
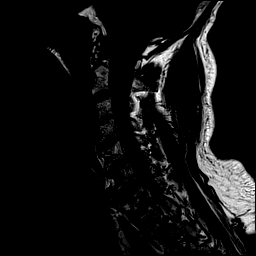
[im 12/15]
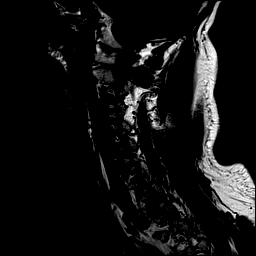
[im 15/15]
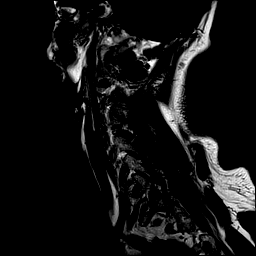

[Series 7: STIR · sagittal · 3.0mm · 0.62mm/px · 7 of 15 slices shown]
[im 1/15]
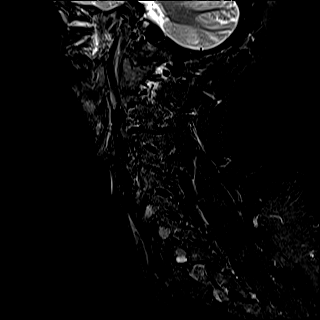
[im 3/15]
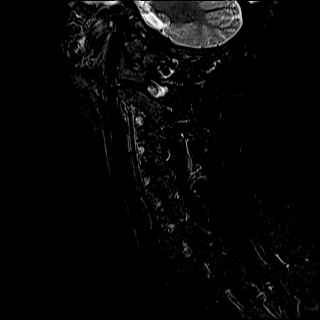
[im 5/15]
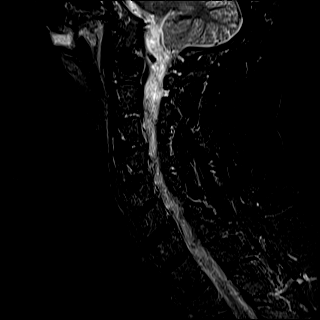
[im 8/15]
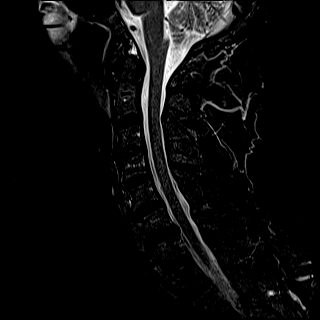
[im 10/15]
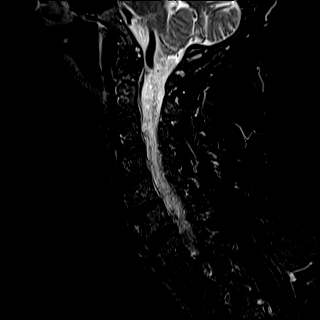
[im 12/15]
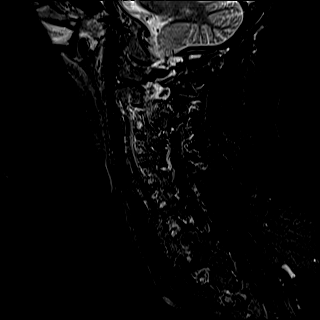
[im 15/15]
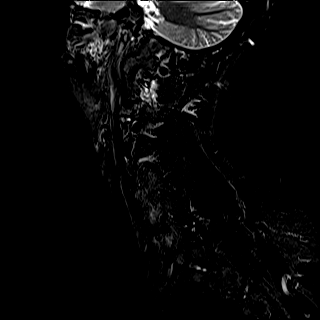

[Series 8: T2 · axial · 3.0mm · 0.70mm/px · z∈[-117,-26]mm · 13 of 29 slices shown (2 of 2)]
[im 1/29]
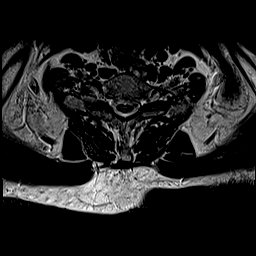
[im 3/29]
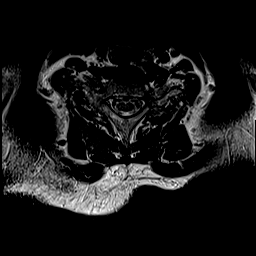
[im 5/29]
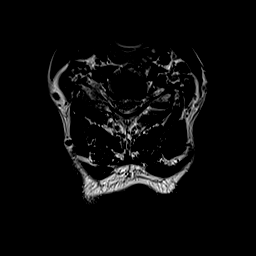
[im 7/29]
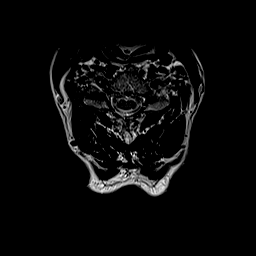
[im 9/29]
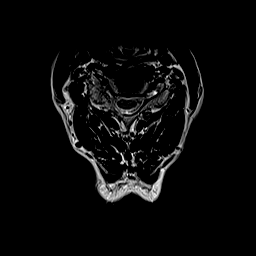
[im 11/29]
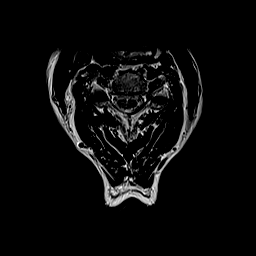
[im 13/29]
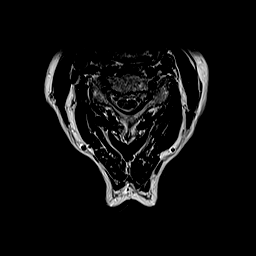
[im 16/29]
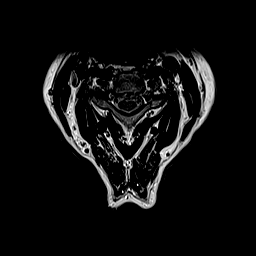
[im 18/29]
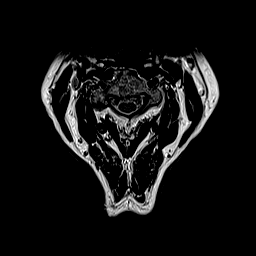
[im 20/29]
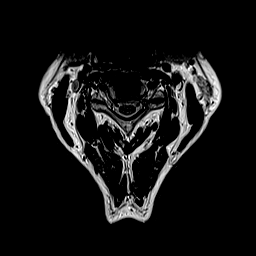
[im 22/29]
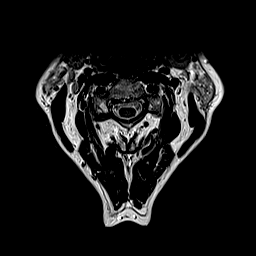
[im 24/29]
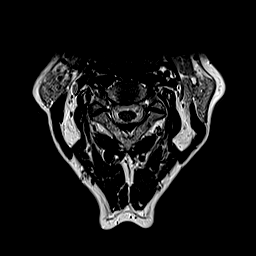
[im 29/29]
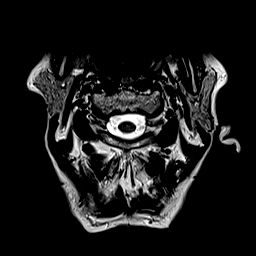

[Series 9: ax mpgr · axial · 3.0mm · 0.35mm/px · z∈[-117,-26]mm · 8 of 29 slices shown]
[im 1/29]
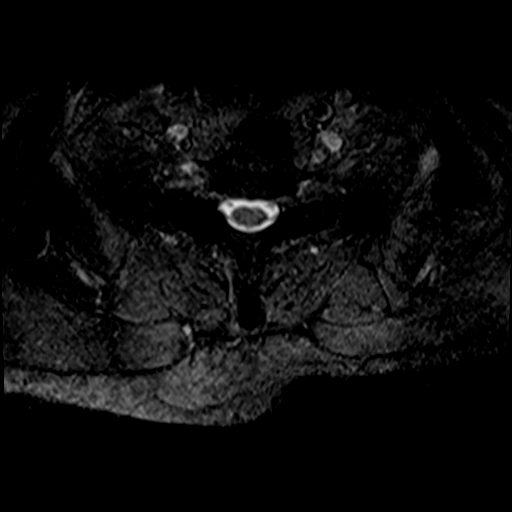
[im 5/29]
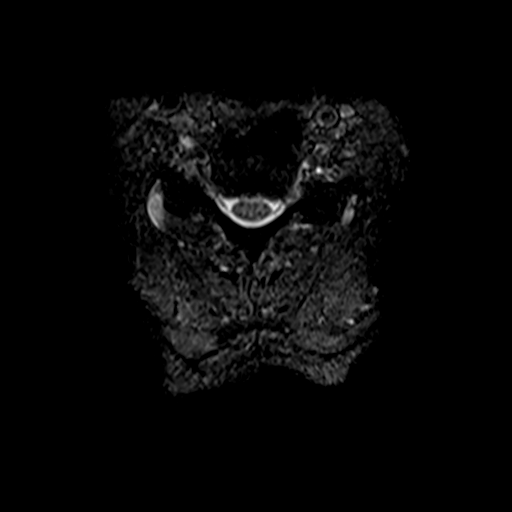
[im 9/29]
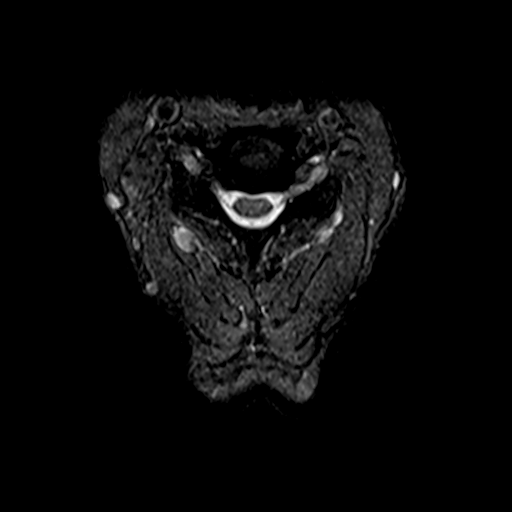
[im 13/29]
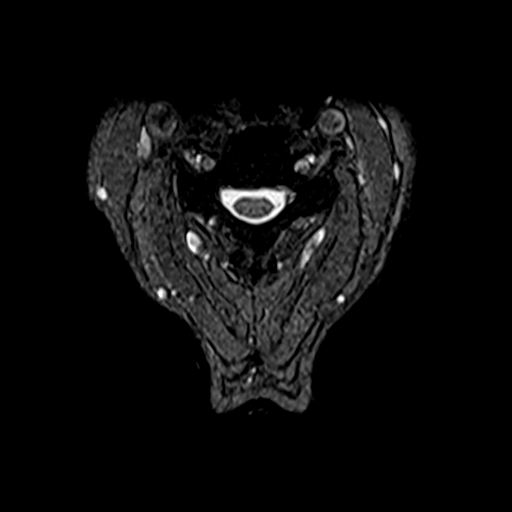
[im 16/29]
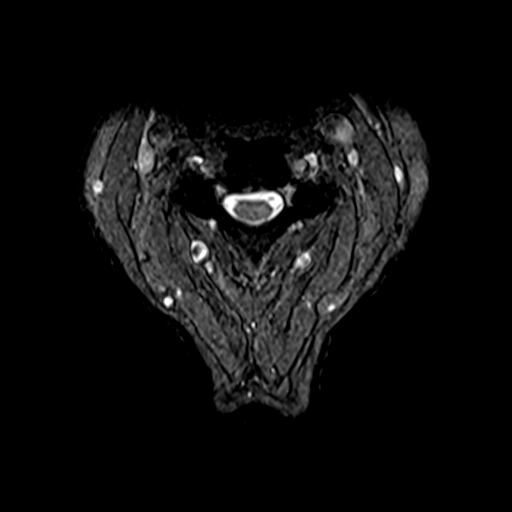
[im 20/29]
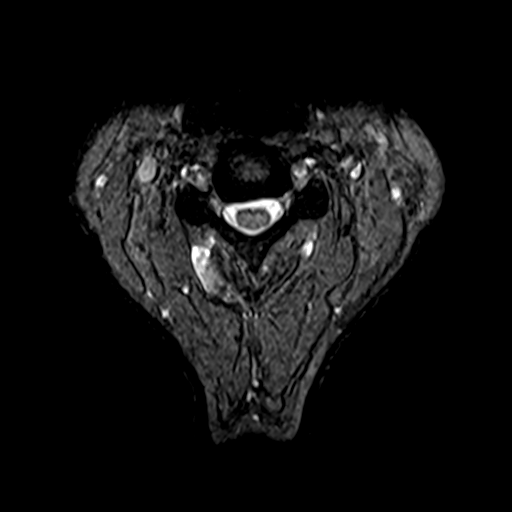
[im 24/29]
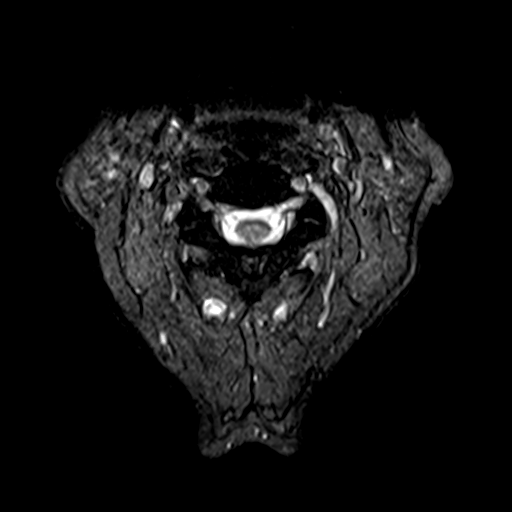
[im 29/29]
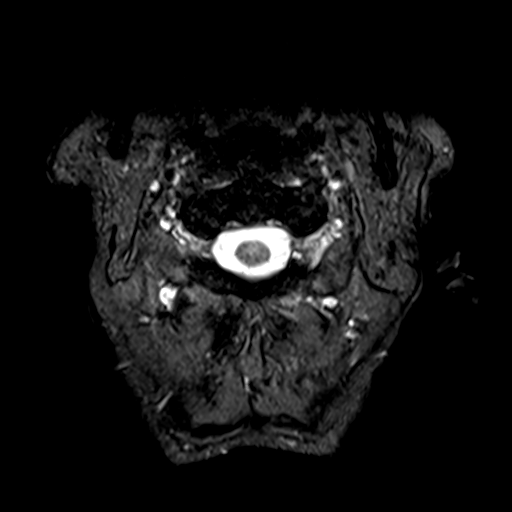

[41 of 48 positions shown; findings below may reference images not displayed]

FINDINGS: Alignment: Normal

Vertebrae: Normal bone marrow.  Negative for fracture or mass.

Cord: Normal signal and morphology

Posterior Fossa, vertebral arteries, paraspinal tissues: Negative

Disc levels:

C2-3: Negative

C3-4: Mild disc degeneration and mild uncinate spurring. No
significant stenosis.

C4-5: Mild disc degeneration. Moderate facet degeneration right
greater than left. No significant spinal or foraminal stenosis.

C5-6: Mild disc degeneration. Bilateral facet degeneration. No
significant spinal or foraminal stenosis.

C6-7: Small central disc protrusion touching the cord without
significant spinal stenosis. Neural foramina patent bilaterally.

C7-T1: Negative
IMPRESSION: Mild degenerative changes cervical spine. Mild disc and facet
degeneration at multiple levels without significant neural
impingement

## 2020-11-10 ENCOUNTER — Other Ambulatory Visit: Payer: Self-pay | Admitting: Nurse Practitioner

## 2020-11-15 DIAGNOSIS — M542 Cervicalgia: Secondary | ICD-10-CM | POA: Diagnosis not present

## 2020-11-15 DIAGNOSIS — M5412 Radiculopathy, cervical region: Secondary | ICD-10-CM | POA: Diagnosis not present

## 2020-11-22 DIAGNOSIS — K760 Fatty (change of) liver, not elsewhere classified: Secondary | ICD-10-CM | POA: Diagnosis not present

## 2020-11-22 DIAGNOSIS — K769 Liver disease, unspecified: Secondary | ICD-10-CM | POA: Diagnosis not present

## 2020-12-19 ENCOUNTER — Other Ambulatory Visit: Payer: Self-pay | Admitting: Nurse Practitioner

## 2020-12-30 ENCOUNTER — Other Ambulatory Visit: Payer: Self-pay | Admitting: Neurology

## 2021-01-16 DIAGNOSIS — E113511 Type 2 diabetes mellitus with proliferative diabetic retinopathy with macular edema, right eye: Secondary | ICD-10-CM | POA: Diagnosis not present

## 2021-01-19 DIAGNOSIS — K219 Gastro-esophageal reflux disease without esophagitis: Secondary | ICD-10-CM | POA: Diagnosis not present

## 2021-01-19 DIAGNOSIS — E119 Type 2 diabetes mellitus without complications: Secondary | ICD-10-CM | POA: Diagnosis not present

## 2021-01-19 DIAGNOSIS — J45909 Unspecified asthma, uncomplicated: Secondary | ICD-10-CM | POA: Diagnosis not present

## 2021-01-19 DIAGNOSIS — K766 Portal hypertension: Secondary | ICD-10-CM | POA: Diagnosis not present

## 2021-01-19 DIAGNOSIS — R197 Diarrhea, unspecified: Secondary | ICD-10-CM | POA: Diagnosis not present

## 2021-01-19 DIAGNOSIS — K7460 Unspecified cirrhosis of liver: Secondary | ICD-10-CM | POA: Diagnosis not present

## 2021-01-19 DIAGNOSIS — K3189 Other diseases of stomach and duodenum: Secondary | ICD-10-CM | POA: Diagnosis not present

## 2021-01-19 DIAGNOSIS — I8510 Secondary esophageal varices without bleeding: Secondary | ICD-10-CM | POA: Diagnosis not present

## 2021-01-19 DIAGNOSIS — K5520 Angiodysplasia of colon without hemorrhage: Secondary | ICD-10-CM | POA: Diagnosis not present

## 2021-02-04 ENCOUNTER — Other Ambulatory Visit: Payer: Self-pay | Admitting: Student

## 2021-02-13 ENCOUNTER — Other Ambulatory Visit: Payer: Self-pay | Admitting: Student

## 2021-02-13 DIAGNOSIS — K529 Noninfective gastroenteritis and colitis, unspecified: Secondary | ICD-10-CM | POA: Diagnosis not present

## 2021-02-13 DIAGNOSIS — M2550 Pain in unspecified joint: Secondary | ICD-10-CM | POA: Diagnosis not present

## 2021-02-13 DIAGNOSIS — Z23 Encounter for immunization: Secondary | ICD-10-CM | POA: Diagnosis not present

## 2021-02-13 DIAGNOSIS — Z794 Long term (current) use of insulin: Secondary | ICD-10-CM | POA: Diagnosis not present

## 2021-02-13 DIAGNOSIS — E1139 Type 2 diabetes mellitus with other diabetic ophthalmic complication: Secondary | ICD-10-CM | POA: Diagnosis not present

## 2021-02-13 DIAGNOSIS — I10 Essential (primary) hypertension: Secondary | ICD-10-CM | POA: Diagnosis not present

## 2021-02-13 DIAGNOSIS — E119 Type 2 diabetes mellitus without complications: Secondary | ICD-10-CM | POA: Diagnosis not present

## 2021-02-13 DIAGNOSIS — E1165 Type 2 diabetes mellitus with hyperglycemia: Secondary | ICD-10-CM | POA: Diagnosis not present

## 2021-02-20 DIAGNOSIS — Z23 Encounter for immunization: Secondary | ICD-10-CM | POA: Diagnosis not present

## 2021-02-20 DIAGNOSIS — E278 Other specified disorders of adrenal gland: Secondary | ICD-10-CM | POA: Diagnosis not present

## 2021-02-20 DIAGNOSIS — R197 Diarrhea, unspecified: Secondary | ICD-10-CM | POA: Diagnosis not present

## 2021-02-20 DIAGNOSIS — E1141 Type 2 diabetes mellitus with diabetic mononeuropathy: Secondary | ICD-10-CM | POA: Diagnosis not present

## 2021-03-06 DIAGNOSIS — E113511 Type 2 diabetes mellitus with proliferative diabetic retinopathy with macular edema, right eye: Secondary | ICD-10-CM | POA: Diagnosis not present

## 2021-03-06 DIAGNOSIS — E113412 Type 2 diabetes mellitus with severe nonproliferative diabetic retinopathy with macular edema, left eye: Secondary | ICD-10-CM | POA: Diagnosis not present

## 2021-03-06 DIAGNOSIS — Z79899 Other long term (current) drug therapy: Secondary | ICD-10-CM | POA: Diagnosis not present

## 2021-03-06 DIAGNOSIS — E113593 Type 2 diabetes mellitus with proliferative diabetic retinopathy without macular edema, bilateral: Secondary | ICD-10-CM | POA: Diagnosis not present

## 2021-03-23 ENCOUNTER — Other Ambulatory Visit: Payer: Self-pay

## 2021-03-27 ENCOUNTER — Other Ambulatory Visit: Payer: Self-pay

## 2021-03-27 MED ORDER — LORAZEPAM 1 MG PO TABS
1.0000 mg | ORAL_TABLET | ORAL | 0 refills | Status: DC
  Filled 2021-03-27: qty 2, 1d supply, fill #0

## 2021-03-28 ENCOUNTER — Other Ambulatory Visit: Payer: Self-pay | Admitting: *Deleted

## 2021-03-28 NOTE — Patient Outreach (Addendum)
[name]) Care Management  03/28/2021  Robert Fernandez [birth year] [id number]   Transition of care call/case closure   Referral received:[id number] Initial outreach:4/[phone] Insurance: [employer]    Subjective: Initial successful telephone call to patient's preferred number in order to complete transition of care assessment; 2 HIPAA identifiers verified. Explained purpose of call. .  Patient explains that he has not had inpatient hospital admission recently or within the last year at [facility].  He discussed recent outpatient  visit to ophthalmology for injection for eye condition and he has an upcoming outpatient MRI appointment.  He discussed recent follow up telephone visit with [employer] management health coach, due to chronic condition of Diabetes, that is well controlled.  Objective   Per electronic record, Reviewed Care everywhere no inpatient admissions noted.     Plan:  Using [place] website, verified that patient is an active participate in [place] [facility] chronic disease management program.    No ongoing care management needs identified so will close case to [facility] services and route successful outreach letter with [place] [facility] pamphlet and 24 Hour Nurse Line Magnet to [place] [facility] clinical pool to be mailed to patient's home address.   Addendum Incoming call from patient wife , Robert Fernandez, subscriber of [employer] plan. Explained referral received and process for transition of care , she states patient has not had inpatient admission .   Robert Draft, RN, BSN  [place] Management Coordinator  [phone]- Mobile [phone]- Toll Free Main Office

## 2021-04-03 DIAGNOSIS — E11311 Type 2 diabetes mellitus with unspecified diabetic retinopathy with macular edema: Secondary | ICD-10-CM | POA: Diagnosis not present

## 2021-04-03 DIAGNOSIS — Z79899 Other long term (current) drug therapy: Secondary | ICD-10-CM | POA: Diagnosis not present

## 2021-04-06 ENCOUNTER — Other Ambulatory Visit: Payer: Self-pay

## 2021-04-06 MED FILL — Omeprazole Cap Delayed Release 40 MG: ORAL | 90 days supply | Qty: 90 | Fill #0 | Status: AC

## 2021-04-10 DIAGNOSIS — D320 Benign neoplasm of cerebral meninges: Secondary | ICD-10-CM | POA: Diagnosis not present

## 2021-04-10 DIAGNOSIS — D329 Benign neoplasm of meninges, unspecified: Secondary | ICD-10-CM | POA: Diagnosis not present

## 2021-04-11 ENCOUNTER — Other Ambulatory Visit: Payer: Self-pay

## 2021-04-11 MED ORDER — METAXALONE 800 MG PO TABS
800.0000 mg | ORAL_TABLET | Freq: Three times a day (TID) | ORAL | 5 refills | Status: DC
  Filled 2021-04-11 – 2021-04-17 (×2): qty 30, 10d supply, fill #0

## 2021-04-13 ENCOUNTER — Other Ambulatory Visit: Payer: Self-pay

## 2021-04-17 ENCOUNTER — Other Ambulatory Visit: Payer: Self-pay

## 2021-04-17 MED FILL — Triamcinolone Acetonide Cream 0.5%: CUTANEOUS | 15 days supply | Qty: 15 | Fill #0 | Status: AC

## 2021-04-17 MED FILL — Empagliflozin Tab 25 MG: ORAL | 90 days supply | Qty: 90 | Fill #0 | Status: AC

## 2021-04-18 ENCOUNTER — Other Ambulatory Visit: Payer: Self-pay

## 2021-04-18 MED ORDER — TIZANIDINE HCL 4 MG PO TABS
ORAL_TABLET | ORAL | 2 refills | Status: DC
  Filled 2021-04-18: qty 30, 10d supply, fill #0
  Filled 2021-05-18: qty 30, 10d supply, fill #1

## 2021-04-19 ENCOUNTER — Other Ambulatory Visit: Payer: Self-pay

## 2021-04-19 MED FILL — Gabapentin Cap 300 MG: ORAL | 30 days supply | Qty: 150 | Fill #0 | Status: AC

## 2021-04-21 ENCOUNTER — Other Ambulatory Visit: Payer: Self-pay

## 2021-04-24 ENCOUNTER — Other Ambulatory Visit: Payer: Self-pay

## 2021-04-25 ENCOUNTER — Other Ambulatory Visit: Payer: Self-pay

## 2021-04-25 MED ORDER — VERAPAMIL HCL ER 240 MG PO TBCR
EXTENDED_RELEASE_TABLET | ORAL | 0 refills | Status: DC
  Filled 2021-04-25: qty 30, 30d supply, fill #0

## 2021-04-25 MED FILL — Continuous Glucose System Sensor: 28 days supply | Qty: 2 | Fill #0 | Status: AC

## 2021-04-26 ENCOUNTER — Other Ambulatory Visit: Payer: Self-pay

## 2021-05-01 DIAGNOSIS — H35373 Puckering of macula, bilateral: Secondary | ICD-10-CM | POA: Diagnosis not present

## 2021-05-01 DIAGNOSIS — E113412 Type 2 diabetes mellitus with severe nonproliferative diabetic retinopathy with macular edema, left eye: Secondary | ICD-10-CM | POA: Diagnosis not present

## 2021-05-01 DIAGNOSIS — H43823 Vitreomacular adhesion, bilateral: Secondary | ICD-10-CM | POA: Diagnosis not present

## 2021-05-01 DIAGNOSIS — E11311 Type 2 diabetes mellitus with unspecified diabetic retinopathy with macular edema: Secondary | ICD-10-CM | POA: Diagnosis not present

## 2021-05-02 ENCOUNTER — Other Ambulatory Visit: Payer: Self-pay

## 2021-05-09 ENCOUNTER — Other Ambulatory Visit: Payer: Self-pay

## 2021-05-09 MED ORDER — MUPIROCIN 2 % EX OINT
TOPICAL_OINTMENT | CUTANEOUS | 1 refills | Status: DC
  Filled 2021-05-09: qty 22, 7d supply, fill #0

## 2021-05-09 MED ORDER — CLINDAMYCIN HCL 300 MG PO CAPS
ORAL_CAPSULE | ORAL | 0 refills | Status: DC
  Filled 2021-05-09: qty 30, 10d supply, fill #0

## 2021-05-17 ENCOUNTER — Other Ambulatory Visit: Payer: Self-pay

## 2021-05-17 MED FILL — Diclofenac Sodium Tab Delayed Release 75 MG: ORAL | 90 days supply | Qty: 90 | Fill #0 | Status: AC

## 2021-05-18 ENCOUNTER — Other Ambulatory Visit: Payer: Self-pay

## 2021-05-22 MED FILL — Gabapentin Cap 300 MG: ORAL | 30 days supply | Qty: 150 | Fill #1 | Status: AC

## 2021-05-23 ENCOUNTER — Other Ambulatory Visit: Payer: Self-pay

## 2021-05-24 ENCOUNTER — Other Ambulatory Visit: Payer: Self-pay

## 2021-05-24 MED FILL — Continuous Glucose System Sensor: 28 days supply | Qty: 2 | Fill #1 | Status: AC

## 2021-05-25 ENCOUNTER — Other Ambulatory Visit: Payer: Self-pay

## 2021-05-26 ENCOUNTER — Other Ambulatory Visit: Payer: Self-pay

## 2021-05-29 ENCOUNTER — Other Ambulatory Visit: Payer: Self-pay

## 2021-05-29 DIAGNOSIS — E113412 Type 2 diabetes mellitus with severe nonproliferative diabetic retinopathy with macular edema, left eye: Secondary | ICD-10-CM | POA: Diagnosis not present

## 2021-05-29 DIAGNOSIS — E113511 Type 2 diabetes mellitus with proliferative diabetic retinopathy with macular edema, right eye: Secondary | ICD-10-CM | POA: Diagnosis not present

## 2021-05-29 MED ORDER — METOPROLOL SUCCINATE ER 25 MG PO TB24
1.0000 | ORAL_TABLET | Freq: Every day | ORAL | 3 refills | Status: DC
  Filled 2021-05-29: qty 90, 90d supply, fill #0
  Filled 2021-09-06: qty 90, 90d supply, fill #1
  Filled 2021-12-19: qty 90, 90d supply, fill #2

## 2021-06-01 ENCOUNTER — Other Ambulatory Visit (HOSPITAL_COMMUNITY): Payer: Self-pay

## 2021-06-05 ENCOUNTER — Other Ambulatory Visit: Payer: Self-pay

## 2021-06-06 ENCOUNTER — Other Ambulatory Visit: Payer: Self-pay

## 2021-06-07 ENCOUNTER — Other Ambulatory Visit: Payer: Self-pay

## 2021-06-07 MED ORDER — VERAPAMIL HCL ER 240 MG PO TBCR
EXTENDED_RELEASE_TABLET | ORAL | 0 refills | Status: DC
  Filled 2021-06-07: qty 30, 30d supply, fill #0

## 2021-06-13 DIAGNOSIS — M19072 Primary osteoarthritis, left ankle and foot: Secondary | ICD-10-CM | POA: Diagnosis not present

## 2021-06-20 ENCOUNTER — Other Ambulatory Visit (HOSPITAL_COMMUNITY): Payer: Self-pay

## 2021-06-21 DIAGNOSIS — M19072 Primary osteoarthritis, left ankle and foot: Secondary | ICD-10-CM | POA: Diagnosis not present

## 2021-06-21 DIAGNOSIS — M25872 Other specified joint disorders, left ankle and foot: Secondary | ICD-10-CM | POA: Diagnosis not present

## 2021-06-21 DIAGNOSIS — M7732 Calcaneal spur, left foot: Secondary | ICD-10-CM | POA: Diagnosis not present

## 2021-06-21 DIAGNOSIS — Z8639 Personal history of other endocrine, nutritional and metabolic disease: Secondary | ICD-10-CM | POA: Diagnosis not present

## 2021-06-21 DIAGNOSIS — Z8679 Personal history of other diseases of the circulatory system: Secondary | ICD-10-CM | POA: Diagnosis not present

## 2021-06-22 ENCOUNTER — Other Ambulatory Visit: Payer: Self-pay

## 2021-06-22 MED FILL — Gabapentin Cap 300 MG: ORAL | 30 days supply | Qty: 150 | Fill #2 | Status: AC

## 2021-06-30 ENCOUNTER — Other Ambulatory Visit: Payer: Self-pay

## 2021-07-04 ENCOUNTER — Other Ambulatory Visit: Payer: Self-pay

## 2021-07-04 MED FILL — Evolocumab Subcutaneous Soln Auto-Injector 140 MG/ML: SUBCUTANEOUS | 28 days supply | Qty: 2 | Fill #0 | Status: AC

## 2021-07-04 MED FILL — Continuous Glucose System Sensor: 28 days supply | Qty: 2 | Fill #2 | Status: AC

## 2021-07-07 ENCOUNTER — Other Ambulatory Visit: Payer: Self-pay

## 2021-07-07 DIAGNOSIS — R0681 Apnea, not elsewhere classified: Secondary | ICD-10-CM | POA: Diagnosis not present

## 2021-07-07 DIAGNOSIS — R0683 Snoring: Secondary | ICD-10-CM | POA: Diagnosis not present

## 2021-07-07 DIAGNOSIS — G4719 Other hypersomnia: Secondary | ICD-10-CM | POA: Diagnosis not present

## 2021-07-07 DIAGNOSIS — G43709 Chronic migraine without aura, not intractable, without status migrainosus: Secondary | ICD-10-CM | POA: Diagnosis not present

## 2021-07-07 MED ORDER — VERAPAMIL HCL ER 360 MG PO CP24
ORAL_CAPSULE | ORAL | 3 refills | Status: DC
  Filled 2021-07-07: qty 90, 90d supply, fill #0
  Filled 2021-10-05: qty 90, 90d supply, fill #1
  Filled 2022-01-08: qty 90, 90d supply, fill #2
  Filled 2022-04-06: qty 90, 90d supply, fill #3

## 2021-07-07 MED ORDER — GABAPENTIN 300 MG PO CAPS
ORAL_CAPSULE | ORAL | 3 refills | Status: DC
  Filled 2021-07-07 – 2021-07-26 (×2): qty 450, 90d supply, fill #0
  Filled 2021-10-26: qty 450, 90d supply, fill #1
  Filled 2022-01-15: qty 450, 90d supply, fill #2
  Filled 2022-04-17: qty 450, 90d supply, fill #3

## 2021-07-10 ENCOUNTER — Other Ambulatory Visit: Payer: Self-pay

## 2021-07-11 ENCOUNTER — Other Ambulatory Visit: Payer: Self-pay

## 2021-07-24 DIAGNOSIS — M19071 Primary osteoarthritis, right ankle and foot: Secondary | ICD-10-CM | POA: Diagnosis not present

## 2021-07-24 DIAGNOSIS — M19072 Primary osteoarthritis, left ankle and foot: Secondary | ICD-10-CM | POA: Diagnosis not present

## 2021-07-24 DIAGNOSIS — M25872 Other specified joint disorders, left ankle and foot: Secondary | ICD-10-CM | POA: Diagnosis not present

## 2021-07-25 ENCOUNTER — Other Ambulatory Visit: Payer: Self-pay

## 2021-07-25 DIAGNOSIS — E1141 Type 2 diabetes mellitus with diabetic mononeuropathy: Secondary | ICD-10-CM | POA: Diagnosis not present

## 2021-07-25 MED ORDER — OZEMPIC (2 MG/DOSE) 8 MG/3ML ~~LOC~~ SOPN
PEN_INJECTOR | SUBCUTANEOUS | 3 refills | Status: DC
  Filled 2021-07-25: qty 3, 28d supply, fill #0
  Filled 2021-09-14: qty 3, 28d supply, fill #1
  Filled 2021-12-19: qty 6, 56d supply, fill #2

## 2021-07-26 ENCOUNTER — Other Ambulatory Visit: Payer: Self-pay

## 2021-07-26 MED FILL — Empagliflozin Tab 25 MG: ORAL | 90 days supply | Qty: 90 | Fill #1 | Status: AC

## 2021-07-31 DIAGNOSIS — H353231 Exudative age-related macular degeneration, bilateral, with active choroidal neovascularization: Secondary | ICD-10-CM | POA: Diagnosis not present

## 2021-07-31 DIAGNOSIS — E113511 Type 2 diabetes mellitus with proliferative diabetic retinopathy with macular edema, right eye: Secondary | ICD-10-CM | POA: Diagnosis not present

## 2021-07-31 DIAGNOSIS — H35373 Puckering of macula, bilateral: Secondary | ICD-10-CM | POA: Diagnosis not present

## 2021-07-31 DIAGNOSIS — E113412 Type 2 diabetes mellitus with severe nonproliferative diabetic retinopathy with macular edema, left eye: Secondary | ICD-10-CM | POA: Diagnosis not present

## 2021-08-01 ENCOUNTER — Other Ambulatory Visit: Payer: Self-pay

## 2021-08-01 MED FILL — Continuous Glucose System Sensor: 28 days supply | Qty: 2 | Fill #3 | Status: AC

## 2021-08-10 ENCOUNTER — Other Ambulatory Visit: Payer: Self-pay

## 2021-08-10 MED ORDER — OMEPRAZOLE 40 MG PO CPDR
40.0000 mg | DELAYED_RELEASE_CAPSULE | Freq: Every day | ORAL | 3 refills | Status: DC
  Filled 2021-08-10: qty 90, 90d supply, fill #0
  Filled 2021-11-14: qty 90, 90d supply, fill #1
  Filled 2022-02-21: qty 90, 90d supply, fill #2

## 2021-08-15 ENCOUNTER — Other Ambulatory Visit: Payer: Self-pay

## 2021-08-15 MED ORDER — CARESTART COVID-19 HOME TEST VI KIT
PACK | 0 refills | Status: DC
  Filled 2021-08-15: qty 2, 4d supply, fill #0

## 2021-08-16 ENCOUNTER — Other Ambulatory Visit: Payer: Self-pay

## 2021-08-16 DIAGNOSIS — M79672 Pain in left foot: Secondary | ICD-10-CM | POA: Diagnosis not present

## 2021-08-16 DIAGNOSIS — M62838 Other muscle spasm: Secondary | ICD-10-CM | POA: Diagnosis not present

## 2021-08-16 DIAGNOSIS — E1141 Type 2 diabetes mellitus with diabetic mononeuropathy: Secondary | ICD-10-CM | POA: Diagnosis not present

## 2021-08-16 DIAGNOSIS — I10 Essential (primary) hypertension: Secondary | ICD-10-CM | POA: Diagnosis not present

## 2021-08-16 MED ORDER — TIZANIDINE HCL 4 MG PO TABS
ORAL_TABLET | ORAL | 2 refills | Status: DC
  Filled 2021-08-16: qty 30, 10d supply, fill #0

## 2021-08-18 ENCOUNTER — Other Ambulatory Visit: Payer: Self-pay

## 2021-08-25 ENCOUNTER — Other Ambulatory Visit: Payer: Self-pay

## 2021-08-25 MED FILL — Diclofenac Sodium Tab Delayed Release 75 MG: ORAL | 90 days supply | Qty: 90 | Fill #1 | Status: AC

## 2021-08-31 ENCOUNTER — Other Ambulatory Visit: Payer: Self-pay

## 2021-08-31 DIAGNOSIS — I959 Hypotension, unspecified: Secondary | ICD-10-CM | POA: Diagnosis not present

## 2021-08-31 DIAGNOSIS — R252 Cramp and spasm: Secondary | ICD-10-CM | POA: Diagnosis not present

## 2021-08-31 MED ORDER — METHOCARBAMOL 500 MG PO TABS
ORAL_TABLET | ORAL | 2 refills | Status: DC
  Filled 2021-08-31: qty 60, 30d supply, fill #0
  Filled 2021-11-14: qty 60, 30d supply, fill #1
  Filled 2022-01-08: qty 60, 30d supply, fill #2

## 2021-09-01 DIAGNOSIS — S02411A LeFort I fracture, initial encounter for closed fracture: Secondary | ICD-10-CM | POA: Diagnosis not present

## 2021-09-01 DIAGNOSIS — E113412 Type 2 diabetes mellitus with severe nonproliferative diabetic retinopathy with macular edema, left eye: Secondary | ICD-10-CM | POA: Diagnosis not present

## 2021-09-06 ENCOUNTER — Other Ambulatory Visit: Payer: Self-pay

## 2021-09-14 ENCOUNTER — Other Ambulatory Visit: Payer: Self-pay

## 2021-09-14 MED FILL — Continuous Glucose System Sensor: 28 days supply | Qty: 2 | Fill #4 | Status: AC

## 2021-09-14 MED FILL — Evolocumab Subcutaneous Soln Auto-Injector 140 MG/ML: SUBCUTANEOUS | 28 days supply | Qty: 2 | Fill #1 | Status: AC

## 2021-09-15 ENCOUNTER — Other Ambulatory Visit: Payer: Self-pay

## 2021-09-24 ENCOUNTER — Emergency Department
Admission: EM | Admit: 2021-09-24 | Discharge: 2021-09-24 | Disposition: A | Discharge status: Home / Self Care | Payer: 59 | Attending: Emergency Medicine | Admitting: Emergency Medicine

## 2021-09-24 ENCOUNTER — Other Ambulatory Visit: Payer: Self-pay

## 2021-09-24 ENCOUNTER — Emergency Department: Payer: 59

## 2021-09-24 DIAGNOSIS — S7002XA Contusion of left hip, initial encounter: Secondary | ICD-10-CM | POA: Diagnosis not present

## 2021-09-24 DIAGNOSIS — S73102A Unspecified sprain of left hip, initial encounter: Secondary | ICD-10-CM | POA: Diagnosis not present

## 2021-09-24 DIAGNOSIS — W010XXA Fall on same level from slipping, tripping and stumbling without subsequent striking against object, initial encounter: Secondary | ICD-10-CM | POA: Insufficient documentation

## 2021-09-24 DIAGNOSIS — S79912A Unspecified injury of left hip, initial encounter: Secondary | ICD-10-CM | POA: Insufficient documentation

## 2021-09-24 DIAGNOSIS — M25552 Pain in left hip: Secondary | ICD-10-CM | POA: Diagnosis not present

## 2021-09-24 DIAGNOSIS — M79652 Pain in left thigh: Secondary | ICD-10-CM | POA: Insufficient documentation

## 2021-09-24 IMAGING — CT CT HIP*L* W/O CM
2 of 4 series · 16 of 46 positions shown, 18 images · non-contrast
Comparison: CT abdomen pelvis dated [DATE].

CLINICAL DATA: Left hip pain after fall.

EXAM:
CT OF THE LEFT HIP WITHOUT CONTRAST
TECHNIQUE: Multidetector CT imaging of the left hip was performed according to
the standard protocol. Multiplanar CT image reconstructions were
also generated.

[Series 2: pelvis 5.0 b31f · axial · 0.43mm/px · z∈[-1166,-906]mm · 13 of 61 slices shown, 15 images]
[im 5/61  soft-tissue]
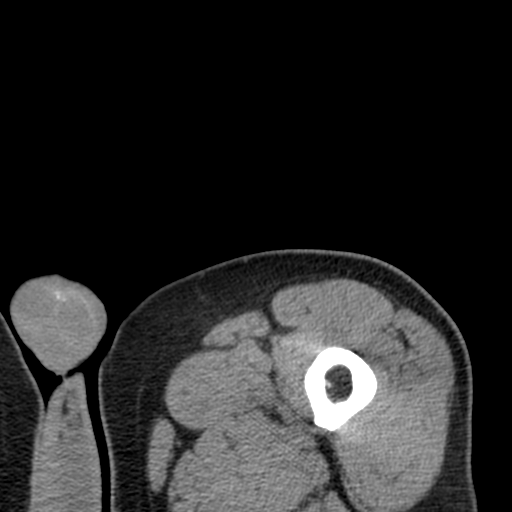
[im 5/61  bone]
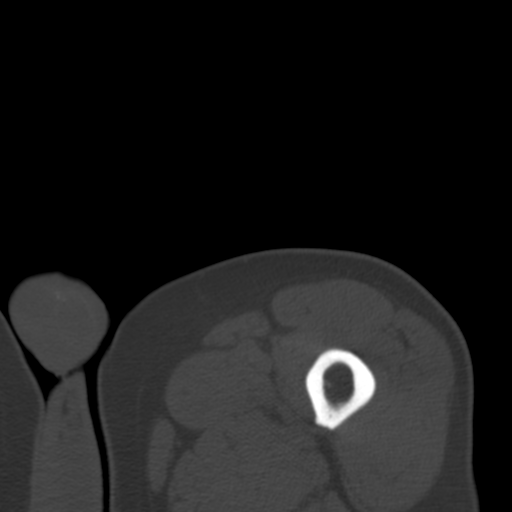
[im 9/61  soft-tissue]
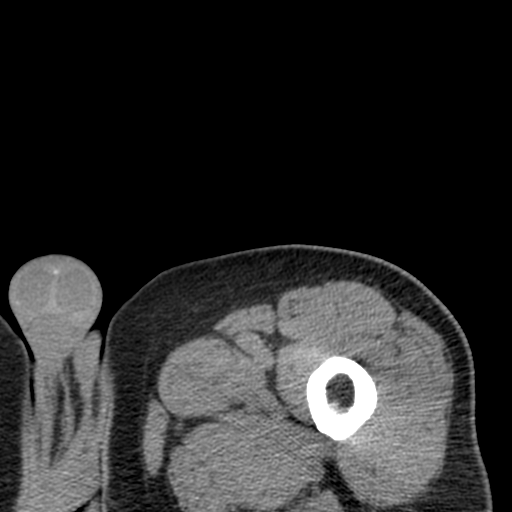
[im 13/61  soft-tissue]
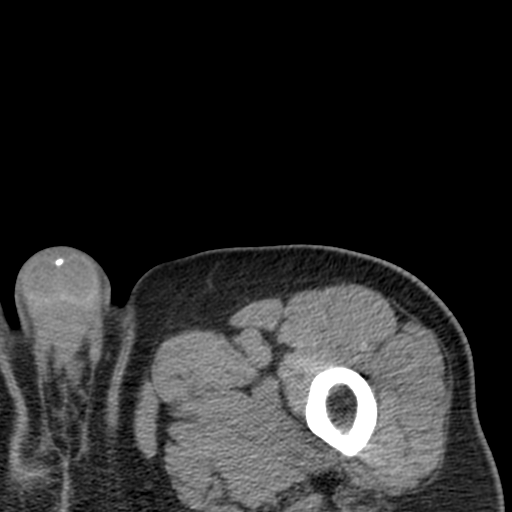
[im 17/61  soft-tissue]
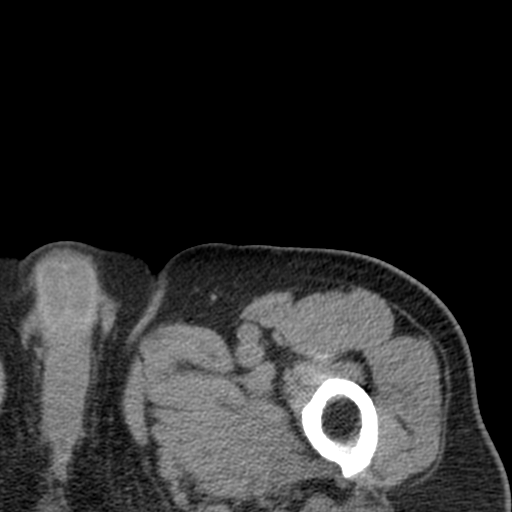
[im 21/61  soft-tissue]
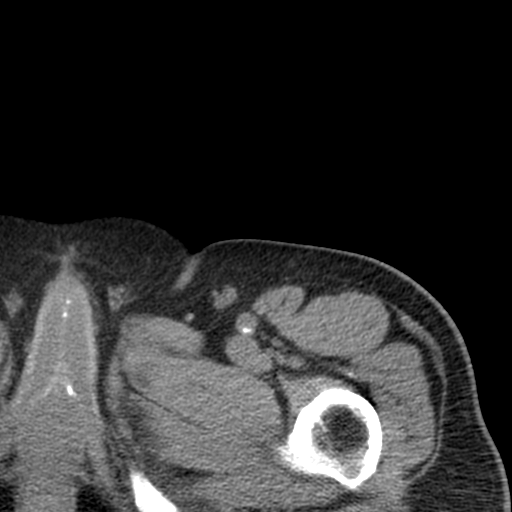
[im 25/61  soft-tissue]
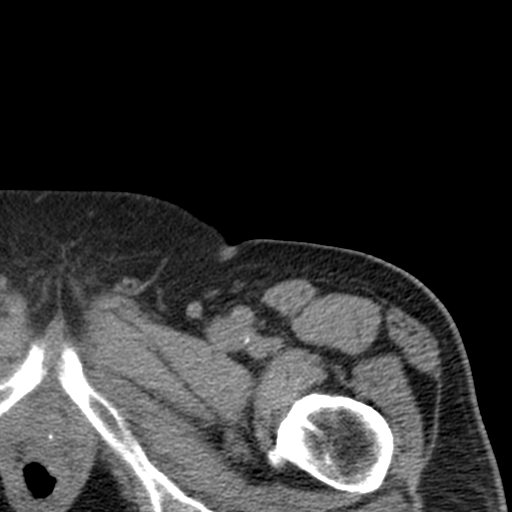
[im 33/61  soft-tissue]
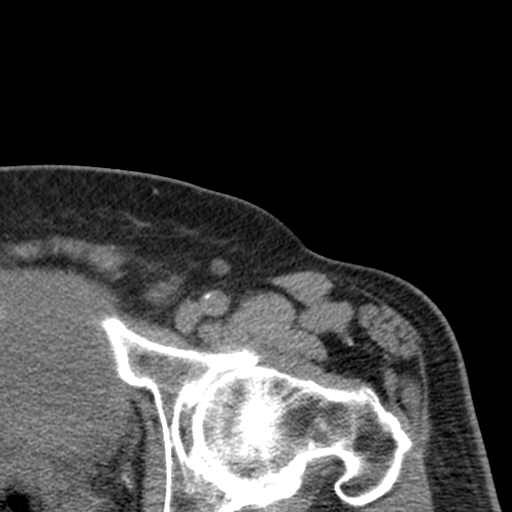
[im 37/61  soft-tissue]
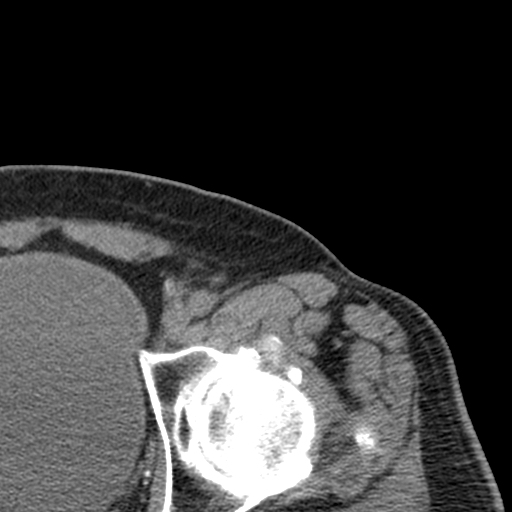
[im 41/61  soft-tissue]
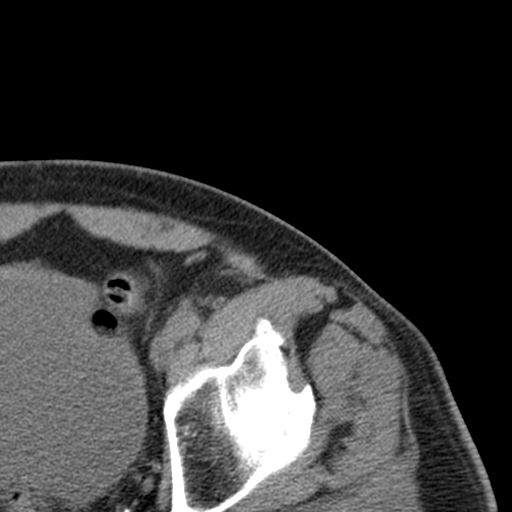
[im 41/61  bone]
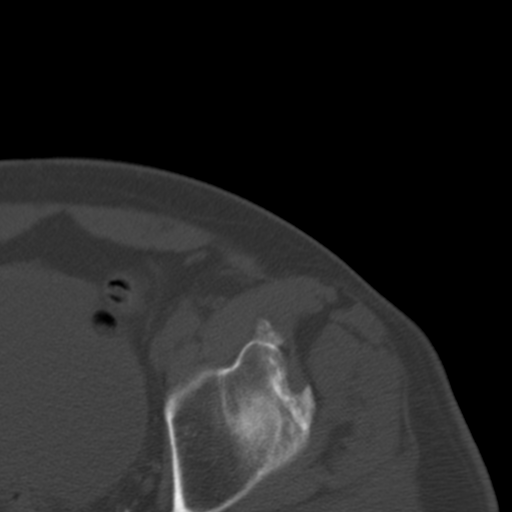
[im 45/61  soft-tissue]
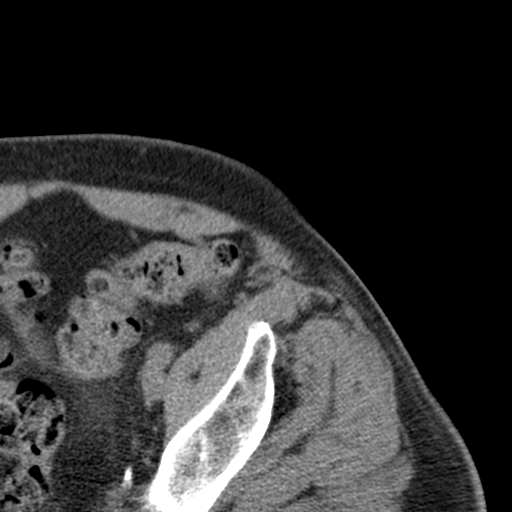
[im 49/61  soft-tissue]
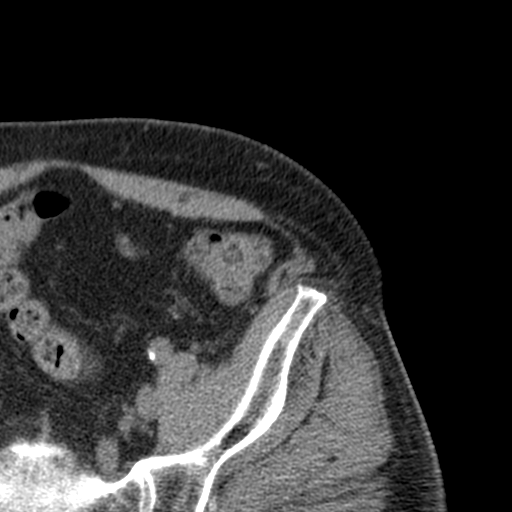
[im 53/61  soft-tissue]
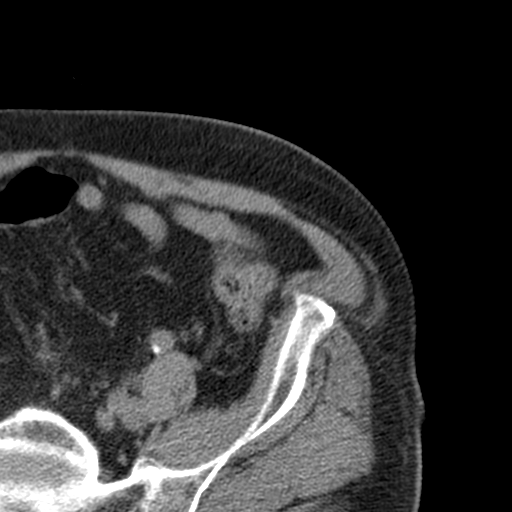
[im 57/61  soft-tissue]
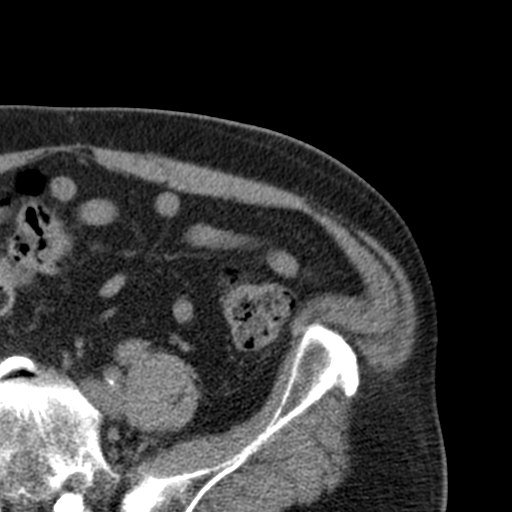

[Series 5: pelvis 3.0 mpr cor · coronal · 0.44mm/px · 3 of 84 slices shown]
[im 28/84  soft-tissue]
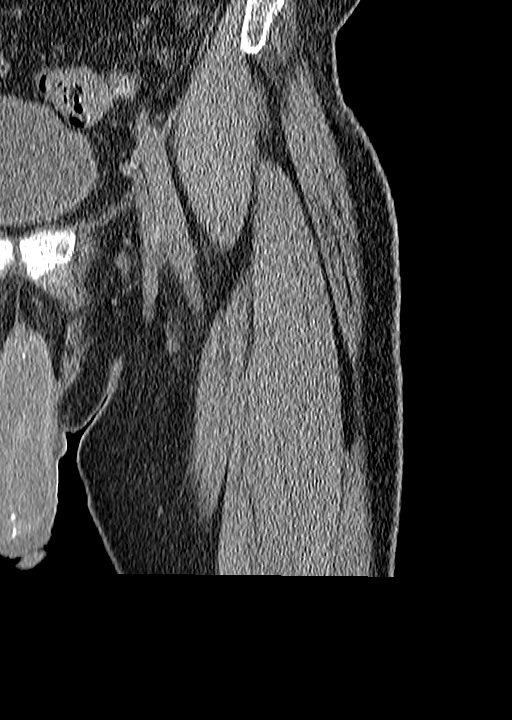
[im 37/84  soft-tissue]
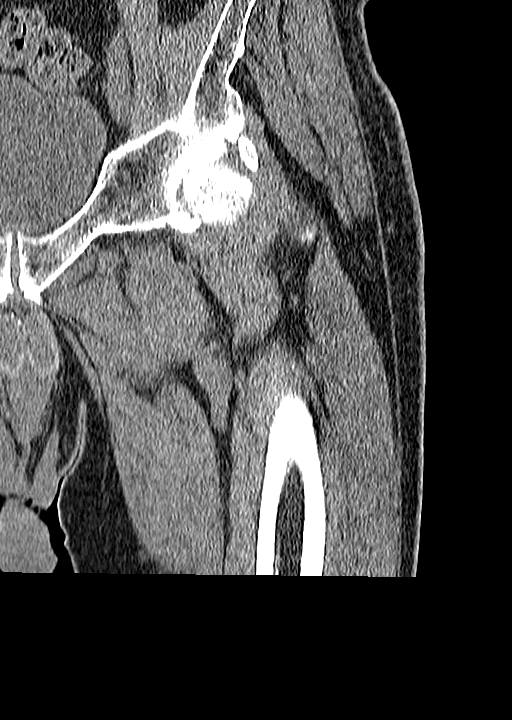
[im 47/84  soft-tissue]
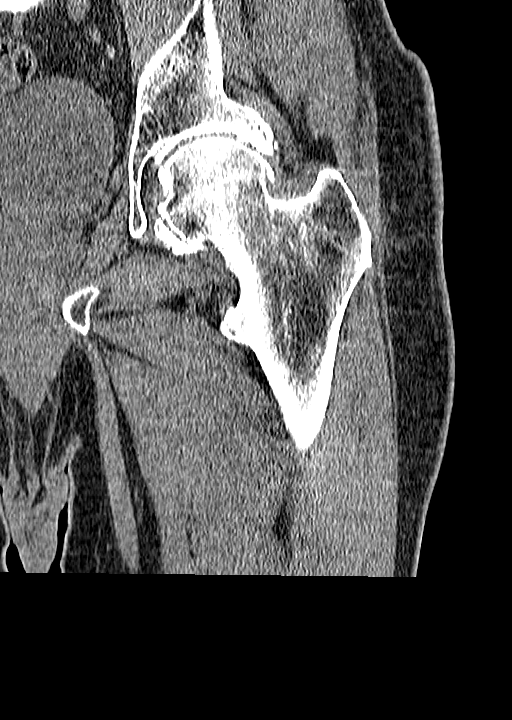

[16 of 46 positions shown; findings below may reference images not displayed]

FINDINGS: Bones/Joint/Cartilage

No fracture or dislocation. Moderate right hip osteoarthritis. No
joint effusion.

Ligaments

Ligaments are suboptimally evaluated by CT.

Muscles and Tendons
Grossly intact.  No muscle atrophy.

Soft tissue
No fluid collection or hematoma.  No soft tissue mass.
IMPRESSION: 1. No acute osseous abnormality.
2. Moderate right hip osteoarthritis.

## 2021-09-24 MED ORDER — CYCLOBENZAPRINE HCL 10 MG PO TABS
10.0000 mg | ORAL_TABLET | Freq: Once | ORAL | Status: AC
  Administered 2021-09-24: 10 mg via ORAL
  Filled 2021-09-24: qty 1

## 2021-09-24 MED ORDER — HYDROCODONE-ACETAMINOPHEN 5-325 MG PO TABS: 1.0000 | ORAL_TABLET | ORAL | 0 refills | Status: AC | PRN

## 2021-09-24 MED ORDER — CYCLOBENZAPRINE HCL 10 MG PO TABS: 10.0000 mg | ORAL_TABLET | Freq: Three times a day (TID) | ORAL | 0 refills | Status: DC | PRN

## 2021-09-24 MED ORDER — HYDROCODONE-ACETAMINOPHEN 5-325 MG PO TABS
1.0000 | ORAL_TABLET | Freq: Once | ORAL | Status: AC
  Administered 2021-09-24: 1 via ORAL
  Filled 2021-09-24: qty 1

## 2021-09-24 NOTE — Discharge Instructions (Addendum)
You likely have a tendon or ligament tear, or possibly a muscle injury to the left hip and thigh.  You may take the Vicodin and Flexeril as needed for pain over the next few days.  Follow-up with an orthopedist of your choice.  Return to the ER for new, worsening, or persistent severe pain, weakness or numbness, difficulty moving the leg, inability to put weight on it, or any other new or worsening symptoms that concern you.

## 2021-09-24 NOTE — ED Provider Notes (Signed)
[facility] Emergency Department Provider Note ____________________________________________   Event Date/Time   First MD Initiated Contact with Patient 09/24/21 1535     (approximate)  I have reviewed the triage vital signs and the nursing notes.   HISTORY  Chief Complaint Fall    HPI Robert Fernandez is a 65 y.o. male with PMH as noted below who presents with left hip and thigh pain after a fall yesterday.  The patient states that he tripped over a root and fell forward.  He denies injuring his back or hitting his head.  He has been able to bear weight on the left leg but has pain on moving around or when he stands on it.  He denies any weakness or numbness.  He had an x-ray done as an outpatient which was negative.  History reviewed. No pertinent past medical history.  There are no problems to display for this patient.   History reviewed. No pertinent surgical history.  Prior to Admission medications   Medication Sig Start Date End Date Taking? Authorizing Provider  cyclobenzaprine (FLEXERIL) 10 MG tablet Take 1 tablet (10 mg total) by mouth 3 (three) times daily as needed for muscle spasms. 09/24/21  Yes Arta Silence, MD  HYDROcodone-acetaminophen (NORCO/VICODIN) 5-325 MG tablet Take 1 tablet by mouth every 4 (four) hours as needed for up to 5 days for severe pain. 09/24/21 09/29/21 Yes Arta Silence, MD  clindamycin (CLEOCIN) 150 MG capsule TAKE 2 CAPSULES BY MOUTH IMMEDIATELY, THEN TAKE 1 CAPSULE BY MOUTH 3 TIMES DAILY UNTIL GONE 03/23/21 03/23/22    clindamycin (CLEOCIN) 300 MG capsule Take 1 capsule (300 mg total) by mouth 3 (three) times daily for 10 days 05/09/21     Continuous Blood Gluc Sensor (FREESTYLE LIBRE 14 DAY SENSOR) MISC INJECT 1 KIT SUBCUTANEOUSLY EVERY 14 (FOURTEEN) DAYS 11/10/20 11/10/21  Marvene Staff, NP  COVID-19 At Home Antigen Test [facility] COVID-19 HOME TEST) KIT use as directed within package instructions 08/15/21    Letta Median, RPH  diclofenac (VOLTAREN) 75 MG EC tablet TAKE 1 TABLET BY MOUTH ONCE DAILY 02/04/21 02/04/22  Gustavo Lah, MD  empagliflozin (JARDIANCE) 25 MG TABS tablet TAKE 1 TABLET BY MOUTH DAILY WITH BREAKFAST 12/19/20 12/19/21  Marvene Staff, NP  Evolocumab 140 MG/ML SOAJ INJECT 140MG UNDER THE SKIN EVERY 14 DAYS 02/04/21 02/04/22  Gustavo Lah, MD  fluticasone [facility]) 50 MCG/ACT nasal spray PLACE 2 SPRAYS INTO BOTH NOSTRILS ONCE DAILY 09/29/20 09/29/21  Wardell Honour, MD  gabapentin (NEURONTIN) 300 MG capsule TAKE 1 CAPSULE BY MOUTH 3 TIMES A DAY AND 2 CAPSULES AT BEDTIME 12/30/20 12/30/21  Ross Marcus, PA-C  gabapentin (NEURONTIN) 300 MG capsule TAKE 2 TO 3 CAPSULES BY MOUTH DAILY AT BEDTIME 04/15/20 04/15/21  Ross Marcus, PA-C  gabapentin (NEURONTIN) 300 MG capsule Take 1 capsule twice a day and 3 capsules at bedtime 07/07/21     LORazepam (ATIVAN) 1 MG tablet Take 1 tablet (1 mg total) by mouth. 03/27/21     losartan (COZAAR) 25 MG tablet TAKE 1 TABLET (25 MG TOTAL) BY MOUTH ONCE DAILY 10/20/20 10/20/21  Gustavo Lah, MD  metaxalone [facility]) 800 MG tablet Take 1 tablet (800 mg total) by mouth 3 (three) times daily as needed for Pain 04/11/21     metFORMIN (GLUCOPHAGE-XR) 500 MG 24 hr tablet TAKE 2 TABLETS (1,000 MG) BY MOUTH 2 TIMES DAILY WITH MEALS 02/04/21 02/04/22  Gustavo Lah, MD  methocarbamol (ROBAXIN) 500  MG tablet Take 1 tablet (500 mg total) by mouth 2 (two) times daily as needed 08/31/21     metoprolol succinate (TOPROL-XL) 25 MG 24 hr tablet TAKE 1 TABLET (25 MG TOTAL) BY MOUTH ONCE DAILY 04/20/20 04/20/21  Gustavo Lah, MD  metoprolol succinate (TOPROL-XL) 25 MG 24 hr tablet Take 1 tablet (25 mg total) by mouth once daily 05/26/21     mupirocin ointment (BACTROBAN) 2 % Apply topically 3 (three) times daily for 7 days 05/09/21     omeprazole (PRILOSEC) 40 MG capsule TAKE 1 CAPSULE BY MOUTH ONCE DAILY 06/21/20 07/05/21  Gustavo Lah, MD  omeprazole (PRILOSEC) 40 MG capsule TAKE 1 CAPSULE BY MOUTH ONCE DAILY 08/10/21     Semaglutide, 1 MG/DOSE, 4 MG/3ML SOPN INJECT 1 MG SUBCUTANEOUSLY EVERY 7 DAYS 09/30/20 09/30/21  Marvene Staff, NP  Semaglutide, 2 MG/DOSE, (OZEMPIC, 2 MG/DOSE,) 8 MG/3ML SOPN Inject 0.75 mLs (2 mg total) subcutaneously once a week 07/25/21     tiZANidine (ZANAFLEX) 4 MG tablet Take 1 tablet (4 mg total) by mouth every 8 (eight) hours as needed 04/17/21     tiZANidine (ZANAFLEX) 4 MG tablet Take 1 tablet (4 mg total) by mouth every 8 (eight) hours as needed 08/16/21     triamcinolone cream (KENALOG) 0.5 % APPLY TO THE AFFECTED AREA(S) TOPICALLY 2 TIMES DAILY FOR UP TO 2 WEEKS AS NEEDED 02/13/21 02/13/22  Gustavo Lah, MD  verapamil (CALAN-SR) 240 MG CR tablet TAKE 1 TABLET (240 MG TOTAL) BY MOUTH ONCE DAILY 04/15/20 04/15/21  Ross Marcus, PA-C  verapamil (CALAN-SR) 240 MG CR tablet Take 1 tablet (240 mg total) by mouth once daily 06/07/21     verapamil (VERELAN PM) 360 MG 24 hr capsule Take 1 capsule (360 mg total) by mouth nightly 07/07/21       Allergies Patient has no allergy information on record.  No family history on file.  Social History    Review of Systems  Constitutional: No fever/chills Eyes: No visual changes. ENT: No sore throat. Cardiovascular: Denies chest pain. Respiratory: Denies shortness of breath. Gastrointestinal: No nausea, no vomiting.  No diarrhea.  Genitourinary: Negative for dysuria.  Musculoskeletal: Negative for back pain.  Positive for left hip pain. Skin: Negative for rash. Neurological: Negative for headaches, focal weakness or numbness.   ____________________________________________   PHYSICAL EXAM:  VITAL SIGNS: ED Triage Vitals  Enc Vitals Group     BP 09/24/21 1403 112/73     Pulse Rate 09/24/21 1403 74     Resp 09/24/21 1403 16     Temp 09/24/21 1403 97.8 F (36.6 C)     Temp Source 09/24/21 1403 Oral     SpO2 09/24/21 1403 97 %      Weight --      Height --      Head Circumference --      Peak Flow --      Pain Score 09/24/21 1407 8     Pain Loc --      Pain Edu? --      Excl. in [place]? --     Constitutional: Alert and oriented. Well appearing and in no acute distress. Eyes: Conjunctivae are normal.  Head: Atraumatic. Nose: No congestion/rhinnorhea. Mouth/Throat: Mucous membranes are moist.   Neck: Normal range of motion.  Cardiovascular: Normal rate, regular rhythm. Good peripheral circulation. Respiratory: Normal respiratory effort.  No retractions.  Gastrointestinal:  No distention.  Musculoskeletal: No lower extremity edema.  Extremities warm  and well perfused.  No midline spinal tenderness.  No left hip deformity.  Patient able to flex left hip to 90 degrees although ROM somewhat limited by pain.  Mild tenderness to palpation to the anterior left hip and thigh.  Full range of motion at the knee.  2+ DP pulse.  Cap refill less than 2 seconds.  No erythema, induration, or abnormal warmth. Neurologic:  Normal speech and language. No gross focal neurologic deficits are appreciated.  Skin:  Skin is warm and dry. No rash noted. Psychiatric: Mood and affect are normal. Speech and behavior are normal.  ____________________________________________   LABS (all labs ordered are listed, but only abnormal results are displayed)  Labs Reviewed - No data to display ____________________________________________  EKG   ____________________________________________  RADIOLOGY  CT L hip:  IMPRESSION:  1. No acute osseous abnormality.  2. Moderate right hip osteoarthritis.    ____________________________________________   PROCEDURES  Procedure(s) performed: No  Procedures  Critical Care performed: No ____________________________________________   INITIAL IMPRESSION / ASSESSMENT AND PLAN / ED COURSE  Pertinent labs & imaging results that were available during my care of the patient were reviewed by me  and considered in my medical decision making (see chart for details).   65 year old male presents with left hip and thigh pain after mechanical fall yesterday.  The patient has pain when standing but is able to bear weight on the leg.  He had a negative x-ray as an outpatient.  On exam the patient is overall well-appearing.  His vital signs are normal.  Physical exam is as above.  The patient is able to range the left hip and move around in bed.  He is able to sit and stand.  He does have pain on range of motion.  There is no deformity.  The left lower extremity is neuro/vascular intact.  CT of the left hip was ordered from triage and is negative for acute fracture or effusion.  Overall presentation is more consistent with a hip sprain or other ligamentous or tendon injury versus muscle strain or tear.  Given that the patient is able to bear weight there is no evidence of an occult fracture not seen on the CT, or indication for emergent MRI.  The patient is stable for discharge home.  We will give hydrocodone and Flexeril for pain relief and he will follow-up with his own orthopedist.  Return precautions given, and he expresses understanding.   ____________________________________________   FINAL CLINICAL IMPRESSION(S) / ED DIAGNOSES  Final diagnoses:  Hip injury, left, initial encounter      NEW MEDICATIONS STARTED DURING THIS VISIT:  Discharge Medication List as of 09/24/2021  4:54 PM     START taking these medications   Details  cyclobenzaprine (FLEXERIL) 10 MG tablet Take 1 tablet (10 mg total) by mouth 3 (three) times daily as needed for muscle spasms., Starting Sun 09/24/2021, Normal    HYDROcodone-acetaminophen (NORCO/VICODIN) 5-325 MG tablet Take 1 tablet by mouth every 4 (four) hours as needed for up to 5 days for severe pain., Starting Sun 09/24/2021, Until Fri 09/29/2021 at 2359, Normal         Note:  This document was prepared using Dragon voice recognition software and  may include unintentional dictation errors.    Arta Silence, MD 09/24/21 [phone]

## 2021-09-24 NOTE — ED Triage Notes (Addendum)
Pt comes with c/o trip and fall last night. Pt stats pain to left hip. Pt states pain when walking and trying to lift it.  Pt did have left hip xray performed and not obvious injuries noted. Pt was sent here for further evaluation.

## 2021-09-24 NOTE — ED Provider Notes (Signed)
Emergency Medicine Provider Triage Evaluation Note  Robert Fernandez , a 65 y.o. male  was evaluated in triage.  Pt complains of left hip pain and difficulty with gait status post fall last night.  He went to [facility] they did x-ray which was negative for fracture.  They sent him here for further evaluation given his difficulty walking.  Review of Systems  Positive: Left hip pain and difficulty with Negative: Back pain, knee pain, ankle pain or swelling  Physical Exam  BP 112/73   Pulse 74   Temp 97.8 F (36.6 C) (Oral)   Resp 16   SpO2 97%  Gen:   Awake, appears in pain but in NAD Resp:  Normal effort  MSK:   Limping gait   Medical Decision Making  Medically screening exam initiated at 2:11 PM.  Appropriate orders placed.  Candee Furbish was informed that the remainder of the evaluation will be completed by another provider, this initial triage assessment does not replace that evaluation, and the importance of remaining in the ED until their evaluation is complete.     Jearld Fenton, NP 09/24/21 1414    Lucrezia Starch, MD 09/24/21 [phone]

## 2021-09-29 DIAGNOSIS — E113412 Type 2 diabetes mellitus with severe nonproliferative diabetic retinopathy with macular edema, left eye: Secondary | ICD-10-CM | POA: Diagnosis not present

## 2021-10-05 ENCOUNTER — Other Ambulatory Visit: Payer: Self-pay

## 2021-10-05 MED ORDER — CARESTART COVID-19 HOME TEST VI KIT
PACK | 0 refills | Status: DC
Start: 2021-10-05 — End: 2023-12-12
  Filled 2021-10-05: qty 2, 4d supply, fill #0

## 2021-10-06 ENCOUNTER — Other Ambulatory Visit: Payer: Self-pay

## 2021-10-26 ENCOUNTER — Other Ambulatory Visit: Payer: Self-pay

## 2021-10-26 MED FILL — Empagliflozin Tab 25 MG: ORAL | 90 days supply | Qty: 90 | Fill #2 | Status: AC

## 2021-11-06 ENCOUNTER — Other Ambulatory Visit: Payer: Self-pay

## 2021-11-06 ENCOUNTER — Encounter: Payer: Self-pay | Admitting: Emergency Medicine

## 2021-11-06 ENCOUNTER — Emergency Department: Payer: 59

## 2021-11-06 ENCOUNTER — Emergency Department
Admission: EM | Admit: 2021-11-06 | Discharge: 2021-11-06 | Disposition: A | Discharge status: Home / Self Care | Payer: 59 | Attending: Emergency Medicine | Admitting: Emergency Medicine

## 2021-11-06 DIAGNOSIS — W01198A Fall on same level from slipping, tripping and stumbling with subsequent striking against other object, initial encounter: Secondary | ICD-10-CM | POA: Diagnosis not present

## 2021-11-06 DIAGNOSIS — I2510 Atherosclerotic heart disease of native coronary artery without angina pectoris: Secondary | ICD-10-CM | POA: Insufficient documentation

## 2021-11-06 DIAGNOSIS — Z79899 Other long term (current) drug therapy: Secondary | ICD-10-CM | POA: Diagnosis not present

## 2021-11-06 DIAGNOSIS — Y92814 Boat as the place of occurrence of the external cause: Secondary | ICD-10-CM | POA: Diagnosis not present

## 2021-11-06 DIAGNOSIS — Z7984 Long term (current) use of oral hypoglycemic drugs: Secondary | ICD-10-CM | POA: Insufficient documentation

## 2021-11-06 DIAGNOSIS — E119 Type 2 diabetes mellitus without complications: Secondary | ICD-10-CM | POA: Diagnosis not present

## 2021-11-06 DIAGNOSIS — S2232XA Fracture of one rib, left side, initial encounter for closed fracture: Secondary | ICD-10-CM | POA: Diagnosis not present

## 2021-11-06 DIAGNOSIS — S2242XA Multiple fractures of ribs, left side, initial encounter for closed fracture: Secondary | ICD-10-CM | POA: Diagnosis not present

## 2021-11-06 DIAGNOSIS — S299XXA Unspecified injury of thorax, initial encounter: Secondary | ICD-10-CM | POA: Diagnosis present

## 2021-11-06 HISTORY — DX: Type 2 diabetes mellitus without complications: E11.9

## 2021-11-06 HISTORY — DX: Atherosclerotic heart disease of native coronary artery without angina pectoris: I25.10

## 2021-11-06 IMAGING — CR DG RIBS W/ CHEST 3+V*L*
1 series · 5 of 5 positions shown · non-contrast
Comparison: None.

CLINICAL DATA: Left rib pain after fall last night.

EXAM:
LEFT RIBS AND CHEST - 3+ VIEW

[Series 1: dg ribs unilateral w/chest left · 0.14mm/px · 5 of 5 slices shown]
[im 1/5]
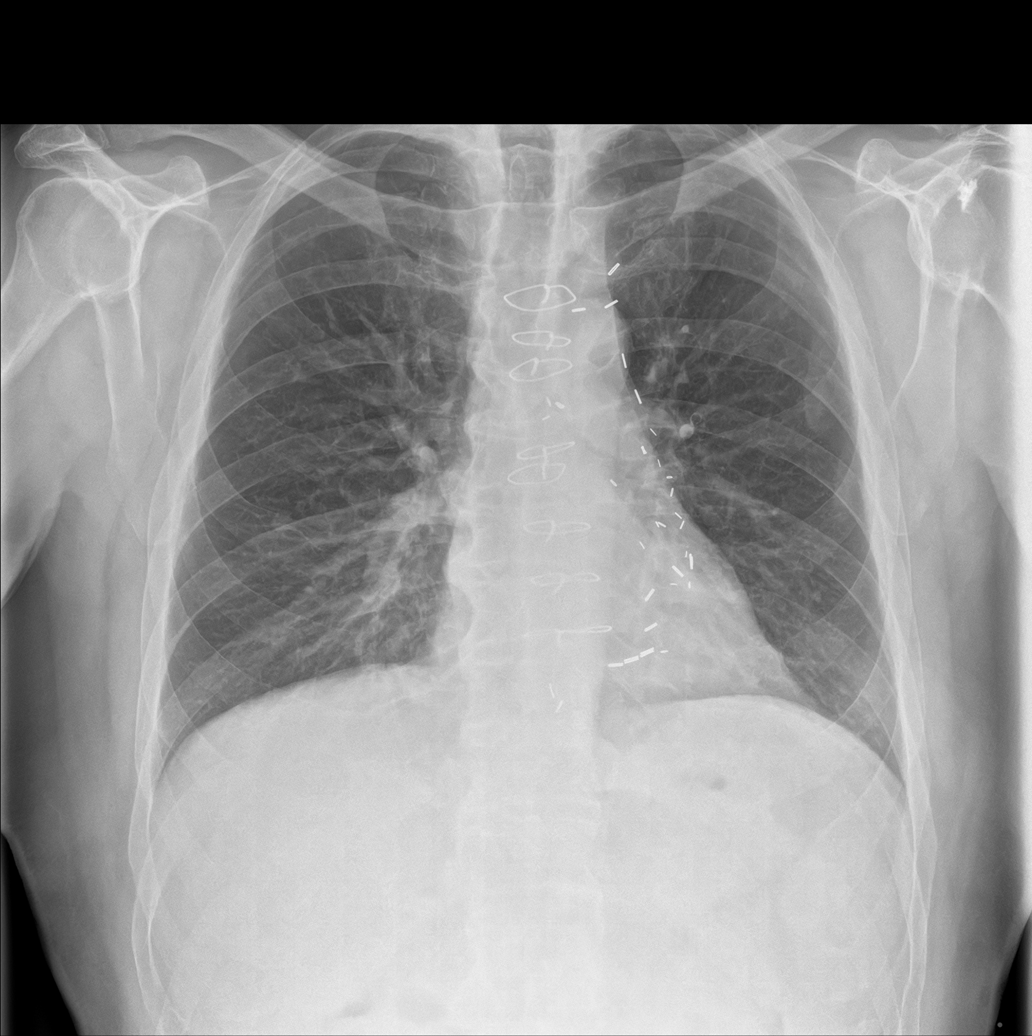
[im 2/5]
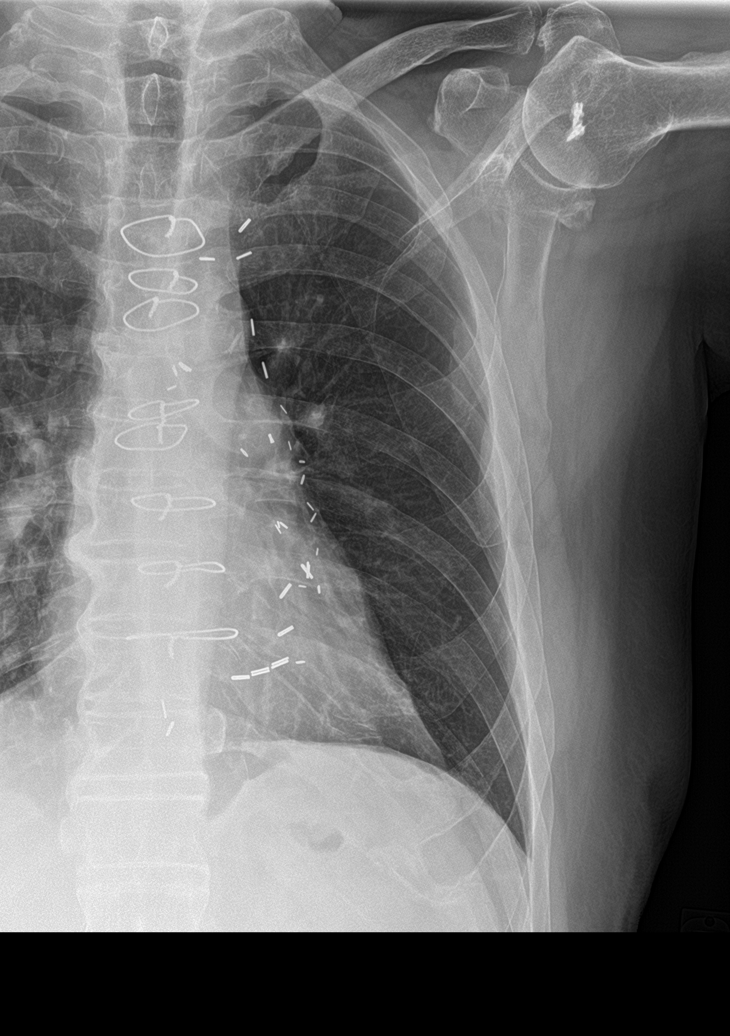
[im 3/5]
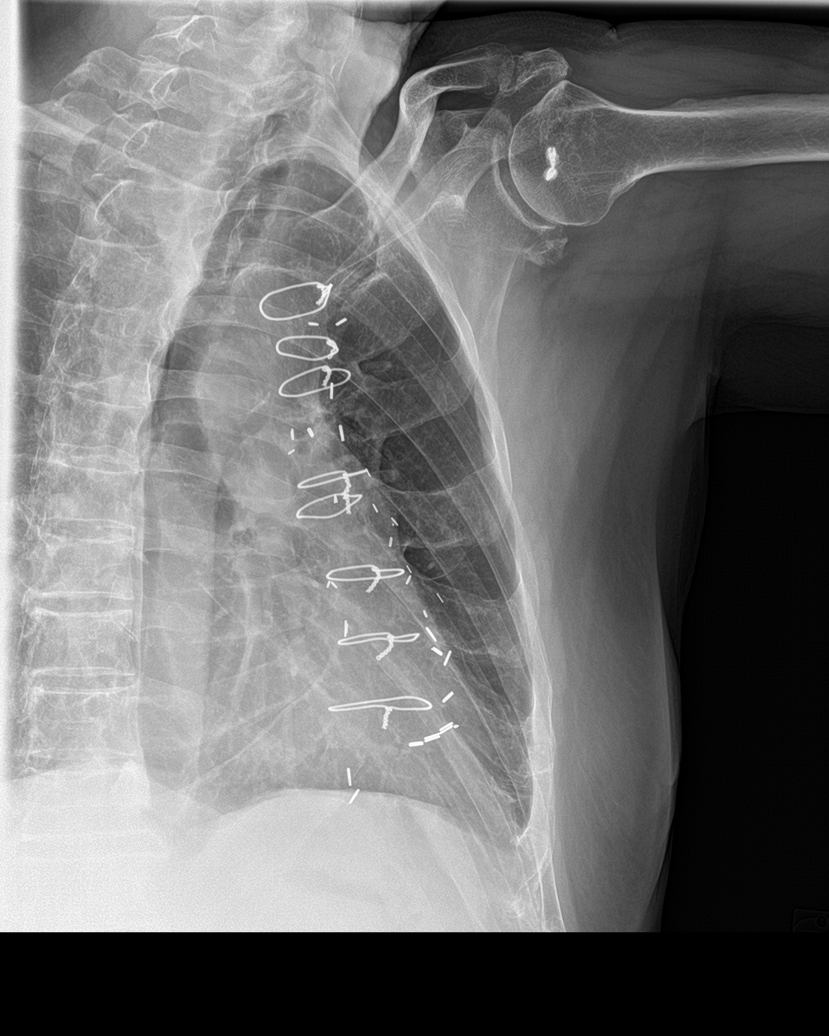
[im 4/5]
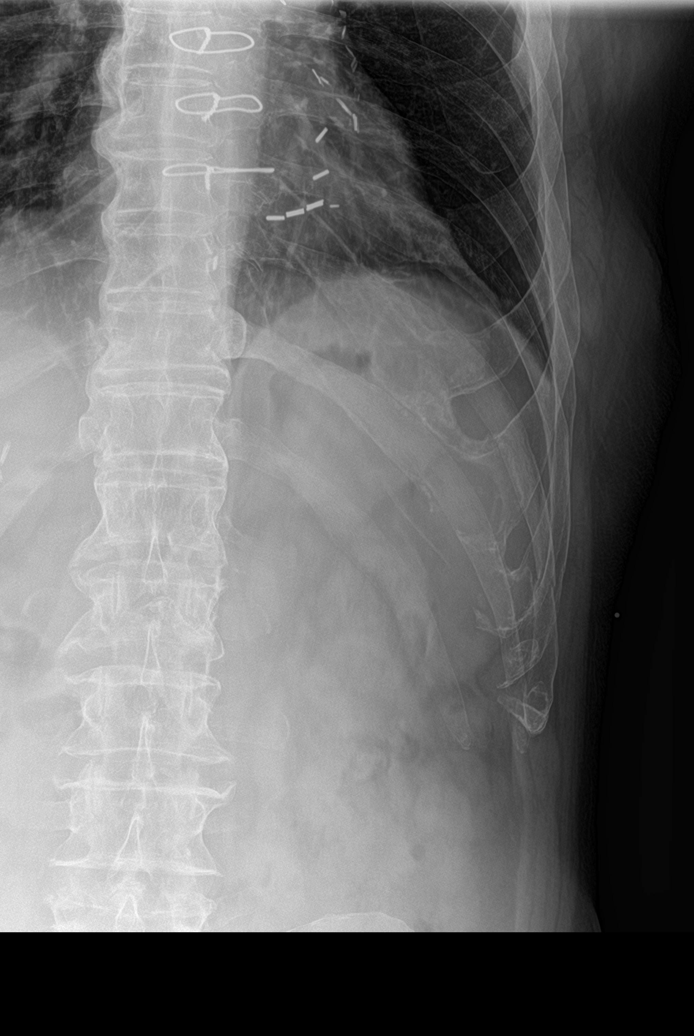
[im 5/5]
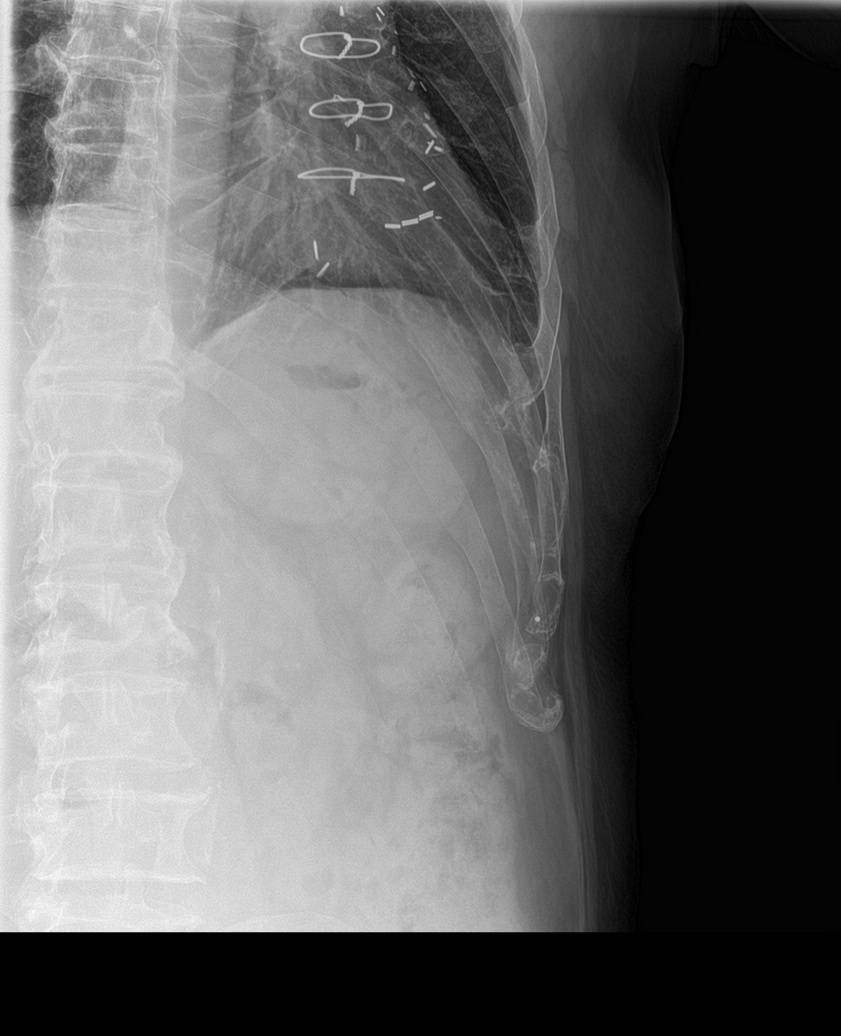

[5 of 5 positions shown; findings below may reference images not displayed]

FINDINGS: Minimally displaced fracture is seen involving the anterior portion
of the left tenth rib. There is no evidence of pneumothorax or
pleural effusion. Both lungs are clear. Heart size and mediastinal
contours are within normal limits.
IMPRESSION: Minimally displaced left tenth rib fracture.

## 2021-11-06 MED ORDER — OXYCODONE-ACETAMINOPHEN 5-325 MG PO TABS
1.0000 | ORAL_TABLET | ORAL | 0 refills | Status: DC | PRN
  Filled 2021-11-06: qty 20, 4d supply, fill #0

## 2021-11-06 NOTE — ED Provider Notes (Signed)
[facility] Emergency Department Provider Note  ____________________________________________   Event Date/Time   First MD Initiated Contact with Patient 11/06/21 1020     (approximate)  I have reviewed the triage vital signs and the nursing notes.   HISTORY  Chief Complaint Rib Injury    HPI Robert Fernandez is a 65 y.o. male presents emergency department complaining of left rib pain.  Patient states he slipped and fell against a aluminum side of the boat with his ribs.  States he did not hit the ground.  No abdominal pain.  No head injury.  Past Medical History:  Diagnosis Date   Coronary artery disease    Diabetes mellitus without complication ([place])     There are no problems to display for this patient.   No past surgical history on file.  Prior to Admission medications   Medication Sig Start Date End Date Taking? Authorizing Provider  oxyCODONE-acetaminophen (PERCOCET) 5-325 MG tablet Take 1 tablet by mouth every 4 (four) hours as needed for severe pain. 11/06/21 11/06/22 Yes Easter Kennebrew, Linden Dolin, PA-C  Continuous Blood Gluc Sensor (FREESTYLE LIBRE 14 DAY SENSOR) MISC INJECT 1 KIT SUBCUTANEOUSLY EVERY 14 (FOURTEEN) DAYS 11/10/20 11/10/21  Marvene Staff, NP  COVID-19 At [facility] Antigen Test [facility] COVID-19 HOME TEST) KIT use as directed 10/05/21   Letta Median, RPH  cyclobenzaprine (FLEXERIL) 10 MG tablet Take 1 tablet (10 mg total) by mouth 3 (three) times daily as needed for muscle spasms. 09/24/21   Arta Silence, MD  diclofenac (VOLTAREN) 75 MG EC tablet TAKE 1 TABLET BY MOUTH ONCE DAILY 02/04/21 02/04/22  Gustavo Lah, MD  empagliflozin (JARDIANCE) 25 MG TABS tablet TAKE 1 TABLET BY MOUTH DAILY WITH BREAKFAST 12/19/20 01/25/22  Marvene Staff, NP  Evolocumab 140 MG/ML SOAJ INJECT 140MG UNDER THE SKIN EVERY 14 DAYS 02/04/21 02/04/22  Gustavo Lah, MD  fluticasone [facility]) 50 MCG/ACT nasal spray PLACE 2 SPRAYS INTO BOTH NOSTRILS ONCE  DAILY 09/29/20 09/29/21  Wardell Honour, MD  gabapentin (NEURONTIN) 300 MG capsule TAKE 1 CAPSULE BY MOUTH 3 TIMES A DAY AND 2 CAPSULES AT BEDTIME 12/30/20 12/30/21  Ross Marcus, PA-C  gabapentin (NEURONTIN) 300 MG capsule TAKE 2 TO 3 CAPSULES BY MOUTH DAILY AT BEDTIME 04/15/20 04/15/21  Ross Marcus, PA-C  gabapentin (NEURONTIN) 300 MG capsule Take 1 capsule twice a day and 3 capsules at bedtime 07/07/21     LORazepam (ATIVAN) 1 MG tablet Take 1 tablet (1 mg total) by mouth. 03/27/21     losartan (COZAAR) 25 MG tablet TAKE 1 TABLET (25 MG TOTAL) BY MOUTH ONCE DAILY 10/20/20 10/20/21  Gustavo Lah, MD  metaxalone [facility]) 800 MG tablet Take 1 tablet (800 mg total) by mouth 3 (three) times daily as needed for Pain 04/11/21     metFORMIN (GLUCOPHAGE-XR) 500 MG 24 hr tablet TAKE 2 TABLETS (1,000 MG) BY MOUTH 2 TIMES DAILY WITH MEALS 02/04/21 02/04/22  Gustavo Lah, MD  methocarbamol (ROBAXIN) 500 MG tablet Take 1 tablet (500 mg total) by mouth 2 (two) times daily as needed 08/31/21     metoprolol succinate (TOPROL-XL) 25 MG 24 hr tablet TAKE 1 TABLET (25 MG TOTAL) BY MOUTH ONCE DAILY 04/20/20 04/20/21  Gustavo Lah, MD  metoprolol succinate (TOPROL-XL) 25 MG 24 hr tablet Take 1 tablet (25 mg total) by mouth once daily 05/26/21     mupirocin ointment (BACTROBAN) 2 % Apply topically 3 (three) times daily for 7 days  05/09/21     omeprazole (PRILOSEC) 40 MG capsule TAKE 1 CAPSULE BY MOUTH ONCE DAILY 06/21/20 07/05/21  Gustavo Lah, MD  omeprazole (PRILOSEC) 40 MG capsule TAKE 1 CAPSULE BY MOUTH ONCE DAILY 08/10/21     Semaglutide, 2 MG/DOSE, (OZEMPIC, 2 MG/DOSE,) 8 MG/3ML SOPN Inject 0.75 mLs (2 mg total) subcutaneously once a week 07/25/21     tiZANidine (ZANAFLEX) 4 MG tablet Take 1 tablet (4 mg total) by mouth every 8 (eight) hours as needed 04/17/21     tiZANidine (ZANAFLEX) 4 MG tablet Take 1 tablet (4 mg total) by mouth every 8 (eight) hours as needed 08/16/21      triamcinolone cream (KENALOG) 0.5 % APPLY TO THE AFFECTED AREA(S) TOPICALLY 2 TIMES DAILY FOR UP TO 2 WEEKS AS NEEDED 02/13/21 02/13/22  Gustavo Lah, MD  verapamil (CALAN-SR) 240 MG CR tablet TAKE 1 TABLET (240 MG TOTAL) BY MOUTH ONCE DAILY 04/15/20 04/15/21  Ross Marcus, PA-C  verapamil (CALAN-SR) 240 MG CR tablet Take 1 tablet (240 mg total) by mouth once daily 06/07/21     verapamil (VERELAN PM) 360 MG 24 hr capsule Take 1 capsule (360 mg total) by mouth nightly 07/07/21       Allergies Augmentin [amoxicillin-pot clavulanate], Codeine, and Dilaudid [hydromorphone hcl]  No family history on file.  Social History    Review of Systems  Constitutional: No fever/chills Eyes: No visual changes. ENT: No sore throat. Respiratory: Denies cough Cardiovascular: Denies chest pain Gastrointestinal: Denies abdominal pain Genitourinary: Negative for dysuria. Musculoskeletal: Negative for back pain. Skin: Negative for rash. Psychiatric: no mood changes,     ____________________________________________   PHYSICAL EXAM:  VITAL SIGNS: ED Triage Vitals  Enc Vitals Group     BP 11/06/21 1002 129/70     Pulse Rate 11/06/21 1002 72     Resp 11/06/21 1002 16     Temp 11/06/21 1002 97.8 F (36.6 C)     Temp Source 11/06/21 1002 Oral     SpO2 11/06/21 1002 95 %     Weight 11/06/21 0943 160 lb (72.6 kg)     Height 11/06/21 0943 _0  (1.778 m)     Head Circumference --      Peak Flow --      Pain Score 11/06/21 0943 7     Pain Loc --      Pain Edu? --      Excl. in [place]? --     Constitutional: Alert and oriented. Well appearing and in no acute distress. Eyes: Conjunctivae are normal.  Head: Atraumatic. Nose: No congestion/rhinnorhea. Mouth/Throat: Mucous membranes are moist.   Neck:  supple no lymphadenopathy noted Cardiovascular: Normal rate, regular rhythm. Heart sounds are normal Respiratory: Normal respiratory effort.  No retractions, lungs c t a, left rib  tender to palpation Abd: soft nontender bs normal all 4 quad GU: deferred Musculoskeletal: FROM all extremities, warm and well perfused Neurologic:  Normal speech and language.  Skin:  Skin is warm, dry and intact. No rash noted. Psychiatric: Mood and affect are normal. Speech and behavior are normal.  ____________________________________________   LABS (all labs ordered are listed, but only abnormal results are displayed)  Labs Reviewed - No data to display ____________________________________________   ____________________________________________  RADIOLOGY  X-ray of the left ribs  ____________________________________________   PROCEDURES  Procedure(s) performed: No  Procedures    ____________________________________________   INITIAL IMPRESSION / ASSESSMENT AND PLAN / ED COURSE  Pertinent labs & imaging results  that were available during my care of the patient were reviewed by me and considered in my medical decision making (see chart for details).   Patient 65 year old male presents after a fall.  See HPI.  Physical exam shows patient appears stable  X-ray of the left ribs reviewed by me confirmed by radiology to have a left rib fracture.  I did explain the findings to the patient.  I did offer to do CT to check his spleen, the area is minimally tender but the patient wants to defer at this time.  He is to return if he is worsening in this area.  Discharged in stable condition with prescription for Percocet.  Also given incentive spirometer     Robert Fernandez was evaluated in Emergency Department on 11/06/2021 for the symptoms described in the history of present illness. He was evaluated in the context of the global COVID-19 pandemic, which necessitated consideration that the patient might be at risk for infection with the SARS-CoV-2 virus that causes COVID-19. Institutional protocols and algorithms that pertain to the evaluation of patients at risk for COVID-19  are in a state of rapid change based on information released by regulatory bodies including the CDC and federal and state organizations. These policies and algorithms were followed during the patient's care in the ED.    As part of my medical decision making, I reviewed the following data within the [place] notes reviewed and incorporated, Old chart reviewed, Radiograph reviewed , Notes from prior ED visits, and [place] Controlled Substance Database  ____________________________________________   FINAL CLINICAL IMPRESSION(S) / ED DIAGNOSES  Final diagnoses:  Closed fracture of one rib of left side, initial encounter      NEW MEDICATIONS STARTED DURING THIS VISIT:  New Prescriptions   OXYCODONE-ACETAMINOPHEN (PERCOCET) 5-325 MG TABLET    Take 1 tablet by mouth every 4 (four) hours as needed for severe pain.     Note:  This document was prepared using Dragon voice recognition software and may include unintentional dictation errors.    Versie Starks, PA-C 11/06/21 1102    Lucrezia Starch, MD 11/06/21 1537

## 2021-11-06 NOTE — ED Notes (Signed)
See triage note  presents with pain to left lateral rib area  states he slipped and fell hitting the side of boat

## 2021-11-06 NOTE — ED Triage Notes (Signed)
C/O slipping off of a boat yesterday, hit aluminum side of boat with left ribs.  C/O rib pain.  AAOx3.  Skin warm and dry. No SOB/ DOE. NAD

## 2021-11-06 NOTE — Discharge Instructions (Addendum)
Follow-up with your regular doctor as needed.  Return emergency department worsening Take the medication as prescribed Use the incentive spirometer to keep your lungs clear

## 2021-11-14 ENCOUNTER — Other Ambulatory Visit: Payer: Self-pay

## 2021-11-20 DIAGNOSIS — E113511 Type 2 diabetes mellitus with proliferative diabetic retinopathy with macular edema, right eye: Secondary | ICD-10-CM | POA: Diagnosis not present

## 2021-11-20 DIAGNOSIS — H35373 Puckering of macula, bilateral: Secondary | ICD-10-CM | POA: Diagnosis not present

## 2021-11-20 DIAGNOSIS — E113412 Type 2 diabetes mellitus with severe nonproliferative diabetic retinopathy with macular edema, left eye: Secondary | ICD-10-CM | POA: Diagnosis not present

## 2021-12-05 ENCOUNTER — Other Ambulatory Visit: Payer: Self-pay

## 2021-12-05 DIAGNOSIS — E1141 Type 2 diabetes mellitus with diabetic mononeuropathy: Secondary | ICD-10-CM | POA: Diagnosis not present

## 2021-12-05 MED FILL — Diclofenac Sodium Tab Delayed Release 75 MG: ORAL | 90 days supply | Qty: 90 | Fill #2 | Status: AC

## 2021-12-07 ENCOUNTER — Other Ambulatory Visit: Payer: Self-pay

## 2021-12-07 DIAGNOSIS — Z23 Encounter for immunization: Secondary | ICD-10-CM | POA: Diagnosis not present

## 2021-12-07 DIAGNOSIS — Z0000 Encounter for general adult medical examination without abnormal findings: Secondary | ICD-10-CM | POA: Diagnosis not present

## 2021-12-07 DIAGNOSIS — W1849XD Other slipping, tripping and stumbling without falling, subsequent encounter: Secondary | ICD-10-CM | POA: Diagnosis not present

## 2021-12-07 DIAGNOSIS — E11319 Type 2 diabetes mellitus with unspecified diabetic retinopathy without macular edema: Secondary | ICD-10-CM | POA: Diagnosis not present

## 2021-12-07 DIAGNOSIS — R252 Cramp and spasm: Secondary | ICD-10-CM | POA: Diagnosis not present

## 2021-12-07 DIAGNOSIS — E1141 Type 2 diabetes mellitus with diabetic mononeuropathy: Secondary | ICD-10-CM | POA: Diagnosis not present

## 2021-12-07 DIAGNOSIS — Z87891 Personal history of nicotine dependence: Secondary | ICD-10-CM | POA: Diagnosis not present

## 2021-12-07 DIAGNOSIS — Z79899 Other long term (current) drug therapy: Secondary | ICD-10-CM | POA: Diagnosis not present

## 2021-12-07 DIAGNOSIS — I10 Essential (primary) hypertension: Secondary | ICD-10-CM | POA: Diagnosis not present

## 2021-12-07 DIAGNOSIS — K7460 Unspecified cirrhosis of liver: Secondary | ICD-10-CM | POA: Diagnosis not present

## 2021-12-07 DIAGNOSIS — Z7984 Long term (current) use of oral hypoglycemic drugs: Secondary | ICD-10-CM | POA: Diagnosis not present

## 2021-12-07 DIAGNOSIS — S2232XD Fracture of one rib, left side, subsequent encounter for fracture with routine healing: Secondary | ICD-10-CM | POA: Diagnosis not present

## 2021-12-07 MED ORDER — JARDIANCE 25 MG PO TABS
ORAL_TABLET | ORAL | 3 refills | Status: DC
  Filled 2021-12-07 – 2022-02-14 (×2): qty 90, 90d supply, fill #0
  Filled 2022-05-27: qty 90, 90d supply, fill #1
  Filled 2022-09-10: qty 90, 90d supply, fill #2

## 2021-12-14 ENCOUNTER — Other Ambulatory Visit: Payer: Self-pay

## 2021-12-14 MED ORDER — PIOGLITAZONE HCL 15 MG PO TABS
ORAL_TABLET | ORAL | 11 refills | Status: DC
  Filled 2021-12-14: qty 30, 30d supply, fill #0
  Filled 2022-01-16: qty 30, 30d supply, fill #1
  Filled 2022-02-14: qty 30, 30d supply, fill #2
  Filled 2022-03-18: qty 30, 30d supply, fill #3
  Filled 2022-04-24: qty 30, 30d supply, fill #4
  Filled 2022-07-30: qty 30, 30d supply, fill #5
  Filled 2022-08-30: qty 30, 30d supply, fill #6
  Filled 2022-10-01: qty 30, 30d supply, fill #7
  Filled 2022-11-02: qty 30, 30d supply, fill #8
  Filled 2022-12-05: qty 30, 30d supply, fill #9

## 2021-12-19 ENCOUNTER — Other Ambulatory Visit: Payer: Self-pay

## 2021-12-19 MED FILL — Evolocumab Subcutaneous Soln Auto-Injector 140 MG/ML: SUBCUTANEOUS | 28 days supply | Qty: 2 | Fill #2 | Status: AC

## 2021-12-20 ENCOUNTER — Other Ambulatory Visit: Payer: Self-pay

## 2021-12-21 ENCOUNTER — Other Ambulatory Visit: Payer: Self-pay

## 2021-12-22 ENCOUNTER — Other Ambulatory Visit: Payer: Self-pay

## 2021-12-25 ENCOUNTER — Other Ambulatory Visit: Payer: Self-pay

## 2021-12-26 ENCOUNTER — Other Ambulatory Visit: Payer: Self-pay

## 2021-12-26 MED ORDER — FREESTYLE LIBRE 14 DAY SENSOR MISC
11 refills | Status: DC
  Filled 2021-12-26: qty 2, 28d supply, fill #0
  Filled 2022-03-07: qty 2, 28d supply, fill #1
  Filled 2022-03-09 – 2022-10-25 (×2): qty 2, 28d supply, fill #2

## 2022-01-01 DIAGNOSIS — E113412 Type 2 diabetes mellitus with severe nonproliferative diabetic retinopathy with macular edema, left eye: Secondary | ICD-10-CM | POA: Diagnosis not present

## 2022-01-05 ENCOUNTER — Other Ambulatory Visit: Payer: Self-pay

## 2022-01-05 DIAGNOSIS — K7460 Unspecified cirrhosis of liver: Secondary | ICD-10-CM | POA: Diagnosis not present

## 2022-01-05 DIAGNOSIS — G43709 Chronic migraine without aura, not intractable, without status migrainosus: Secondary | ICD-10-CM | POA: Diagnosis not present

## 2022-01-05 MED ORDER — CARVEDILOL 6.25 MG PO TABS
ORAL_TABLET | ORAL | 11 refills | Status: DC
  Filled 2022-01-05: qty 60, 30d supply, fill #0
  Filled 2022-02-11: qty 60, 30d supply, fill #1
  Filled 2022-03-18: qty 60, 30d supply, fill #2
  Filled 2022-04-17: qty 60, 30d supply, fill #3
  Filled 2022-05-18: qty 60, 30d supply, fill #4
  Filled 2022-06-21: qty 60, 30d supply, fill #5

## 2022-01-05 MED ORDER — OMEPRAZOLE 40 MG PO CPDR
DELAYED_RELEASE_CAPSULE | ORAL | 11 refills | Status: DC
  Filled 2022-01-05 – 2022-01-08 (×2): qty 60, 30d supply, fill #0
  Filled 2022-02-21: qty 180, 90d supply, fill #0
  Filled 2022-08-30: qty 180, 90d supply, fill #1

## 2022-01-08 ENCOUNTER — Other Ambulatory Visit: Payer: Self-pay

## 2022-01-09 ENCOUNTER — Other Ambulatory Visit: Payer: Self-pay

## 2022-01-15 ENCOUNTER — Other Ambulatory Visit: Payer: Self-pay

## 2022-01-16 ENCOUNTER — Other Ambulatory Visit: Payer: Self-pay

## 2022-01-18 DIAGNOSIS — K7460 Unspecified cirrhosis of liver: Secondary | ICD-10-CM | POA: Diagnosis not present

## 2022-01-19 ENCOUNTER — Other Ambulatory Visit: Payer: Self-pay

## 2022-01-19 MED ORDER — TRAMADOL HCL 50 MG PO TABS
ORAL_TABLET | ORAL | 0 refills | Status: DC
  Filled 2022-01-19: qty 30, 30d supply, fill #0

## 2022-02-12 ENCOUNTER — Other Ambulatory Visit: Payer: Self-pay

## 2022-02-12 DIAGNOSIS — E113512 Type 2 diabetes mellitus with proliferative diabetic retinopathy with macular edema, left eye: Secondary | ICD-10-CM | POA: Diagnosis not present

## 2022-02-12 DIAGNOSIS — E113591 Type 2 diabetes mellitus with proliferative diabetic retinopathy without macular edema, right eye: Secondary | ICD-10-CM | POA: Diagnosis not present

## 2022-02-12 DIAGNOSIS — E113593 Type 2 diabetes mellitus with proliferative diabetic retinopathy without macular edema, bilateral: Secondary | ICD-10-CM | POA: Diagnosis not present

## 2022-02-14 ENCOUNTER — Other Ambulatory Visit: Payer: Self-pay

## 2022-02-15 ENCOUNTER — Other Ambulatory Visit: Payer: Self-pay

## 2022-02-15 MED ORDER — METHOCARBAMOL 500 MG PO TABS
ORAL_TABLET | ORAL | 2 refills | Status: DC
  Filled 2022-02-15: qty 60, 30d supply, fill #0
  Filled 2022-04-06: qty 60, 30d supply, fill #1
  Filled 2022-05-27: qty 60, 30d supply, fill #2

## 2022-02-16 DIAGNOSIS — R0683 Snoring: Secondary | ICD-10-CM | POA: Diagnosis not present

## 2022-02-16 DIAGNOSIS — G4733 Obstructive sleep apnea (adult) (pediatric): Secondary | ICD-10-CM | POA: Diagnosis not present

## 2022-02-16 DIAGNOSIS — R0681 Apnea, not elsewhere classified: Secondary | ICD-10-CM | POA: Diagnosis not present

## 2022-02-16 DIAGNOSIS — G4719 Other hypersomnia: Secondary | ICD-10-CM | POA: Diagnosis not present

## 2022-02-21 ENCOUNTER — Other Ambulatory Visit: Payer: Self-pay

## 2022-03-03 DIAGNOSIS — G4733 Obstructive sleep apnea (adult) (pediatric): Secondary | ICD-10-CM | POA: Insufficient documentation

## 2022-03-07 ENCOUNTER — Other Ambulatory Visit: Payer: Self-pay

## 2022-03-08 ENCOUNTER — Other Ambulatory Visit: Payer: Self-pay

## 2022-03-08 MED ORDER — DICLOFENAC SODIUM 75 MG PO TBEC
75.0000 mg | DELAYED_RELEASE_TABLET | Freq: Every day | ORAL | 2 refills | Status: DC
  Filled 2022-03-08: qty 90, 90d supply, fill #0
  Filled 2022-05-27: qty 90, 90d supply, fill #1

## 2022-03-09 ENCOUNTER — Other Ambulatory Visit: Payer: Self-pay

## 2022-03-09 ENCOUNTER — Other Ambulatory Visit (HOSPITAL_COMMUNITY): Payer: Self-pay

## 2022-03-09 MED ORDER — REPATHA SURECLICK 140 MG/ML ~~LOC~~ SOAJ
SUBCUTANEOUS | 0 refills | Status: DC
  Filled 2022-03-09: qty 2, 28d supply, fill #0

## 2022-03-13 DIAGNOSIS — E1141 Type 2 diabetes mellitus with diabetic mononeuropathy: Secondary | ICD-10-CM | POA: Diagnosis not present

## 2022-03-19 ENCOUNTER — Other Ambulatory Visit: Payer: Self-pay

## 2022-03-20 DIAGNOSIS — E1141 Type 2 diabetes mellitus with diabetic mononeuropathy: Secondary | ICD-10-CM | POA: Diagnosis not present

## 2022-03-21 ENCOUNTER — Other Ambulatory Visit: Payer: Self-pay

## 2022-03-23 ENCOUNTER — Other Ambulatory Visit: Payer: Self-pay

## 2022-03-26 DIAGNOSIS — E113412 Type 2 diabetes mellitus with severe nonproliferative diabetic retinopathy with macular edema, left eye: Secondary | ICD-10-CM | POA: Diagnosis not present

## 2022-03-26 DIAGNOSIS — E113511 Type 2 diabetes mellitus with proliferative diabetic retinopathy with macular edema, right eye: Secondary | ICD-10-CM | POA: Diagnosis not present

## 2022-04-06 ENCOUNTER — Other Ambulatory Visit: Payer: Self-pay

## 2022-04-09 ENCOUNTER — Other Ambulatory Visit: Payer: Self-pay

## 2022-04-09 DIAGNOSIS — D329 Benign neoplasm of meninges, unspecified: Secondary | ICD-10-CM | POA: Diagnosis not present

## 2022-04-09 DIAGNOSIS — Z79899 Other long term (current) drug therapy: Secondary | ICD-10-CM | POA: Diagnosis not present

## 2022-04-09 DIAGNOSIS — D320 Benign neoplasm of cerebral meninges: Secondary | ICD-10-CM | POA: Diagnosis not present

## 2022-04-17 ENCOUNTER — Other Ambulatory Visit: Payer: Self-pay

## 2022-04-24 ENCOUNTER — Other Ambulatory Visit: Payer: Self-pay

## 2022-04-30 DIAGNOSIS — G4733 Obstructive sleep apnea (adult) (pediatric): Secondary | ICD-10-CM | POA: Diagnosis not present

## 2022-04-30 DIAGNOSIS — I10 Essential (primary) hypertension: Secondary | ICD-10-CM | POA: Diagnosis not present

## 2022-04-30 DIAGNOSIS — I2510 Atherosclerotic heart disease of native coronary artery without angina pectoris: Secondary | ICD-10-CM | POA: Diagnosis not present

## 2022-05-07 DIAGNOSIS — E113412 Type 2 diabetes mellitus with severe nonproliferative diabetic retinopathy with macular edema, left eye: Secondary | ICD-10-CM | POA: Diagnosis not present

## 2022-05-16 DIAGNOSIS — G4733 Obstructive sleep apnea (adult) (pediatric): Secondary | ICD-10-CM | POA: Diagnosis not present

## 2022-05-18 ENCOUNTER — Other Ambulatory Visit: Payer: Self-pay

## 2022-05-27 ENCOUNTER — Other Ambulatory Visit: Payer: Self-pay

## 2022-05-28 ENCOUNTER — Other Ambulatory Visit: Payer: Self-pay

## 2022-06-14 ENCOUNTER — Other Ambulatory Visit: Payer: Self-pay

## 2022-06-14 DIAGNOSIS — M79672 Pain in left foot: Secondary | ICD-10-CM | POA: Diagnosis not present

## 2022-06-14 DIAGNOSIS — R252 Cramp and spasm: Secondary | ICD-10-CM | POA: Diagnosis not present

## 2022-06-14 DIAGNOSIS — E1141 Type 2 diabetes mellitus with diabetic mononeuropathy: Secondary | ICD-10-CM | POA: Diagnosis not present

## 2022-06-14 DIAGNOSIS — M79605 Pain in left leg: Secondary | ICD-10-CM | POA: Diagnosis not present

## 2022-06-14 DIAGNOSIS — I10 Essential (primary) hypertension: Secondary | ICD-10-CM | POA: Diagnosis not present

## 2022-06-14 MED ORDER — METHOCARBAMOL 750 MG PO TABS
ORAL_TABLET | ORAL | 5 refills | Status: DC
  Filled 2022-06-14: qty 60, 30d supply, fill #0
  Filled 2023-01-23: qty 60, 30d supply, fill #1
  Filled 2023-02-25: qty 60, 30d supply, fill #2
  Filled 2023-03-28: qty 60, 30d supply, fill #3
  Filled 2023-05-10: qty 60, 30d supply, fill #4

## 2022-06-14 MED ORDER — DICLOFENAC SODIUM 75 MG PO TBEC
DELAYED_RELEASE_TABLET | ORAL | 3 refills | Status: DC
  Filled 2022-06-14: qty 90, 90d supply, fill #0

## 2022-06-16 DIAGNOSIS — G4733 Obstructive sleep apnea (adult) (pediatric): Secondary | ICD-10-CM | POA: Diagnosis not present

## 2022-06-19 ENCOUNTER — Other Ambulatory Visit: Payer: Self-pay

## 2022-06-19 DIAGNOSIS — Z7985 Long-term (current) use of injectable non-insulin antidiabetic drugs: Secondary | ICD-10-CM | POA: Diagnosis not present

## 2022-06-19 DIAGNOSIS — E1165 Type 2 diabetes mellitus with hyperglycemia: Secondary | ICD-10-CM | POA: Diagnosis not present

## 2022-06-19 DIAGNOSIS — E11319 Type 2 diabetes mellitus with unspecified diabetic retinopathy without macular edema: Secondary | ICD-10-CM | POA: Diagnosis not present

## 2022-06-19 DIAGNOSIS — I10 Essential (primary) hypertension: Secondary | ICD-10-CM | POA: Diagnosis not present

## 2022-06-19 DIAGNOSIS — E785 Hyperlipidemia, unspecified: Secondary | ICD-10-CM | POA: Diagnosis not present

## 2022-06-19 DIAGNOSIS — E278 Other specified disorders of adrenal gland: Secondary | ICD-10-CM | POA: Diagnosis not present

## 2022-06-19 DIAGNOSIS — E1140 Type 2 diabetes mellitus with diabetic neuropathy, unspecified: Secondary | ICD-10-CM | POA: Diagnosis not present

## 2022-06-19 MED ORDER — OZEMPIC (2 MG/DOSE) 8 MG/3ML ~~LOC~~ SOPN
PEN_INJECTOR | SUBCUTANEOUS | 3 refills | Status: DC
  Filled 2022-06-19: qty 9, 84d supply, fill #0
  Filled 2022-09-25: qty 3, 28d supply, fill #1

## 2022-06-21 ENCOUNTER — Other Ambulatory Visit: Payer: Self-pay

## 2022-06-22 ENCOUNTER — Other Ambulatory Visit: Payer: Self-pay | Admitting: Student

## 2022-06-22 DIAGNOSIS — M79605 Pain in left leg: Secondary | ICD-10-CM

## 2022-06-25 ENCOUNTER — Ambulatory Visit
Admission: RE | Admit: 2022-06-25 | Discharge: 2022-06-25 | Disposition: A | Discharge status: Home / Self Care | Payer: 59 | Source: Ambulatory Visit | Attending: Student | Admitting: Student

## 2022-06-25 DIAGNOSIS — M7989 Other specified soft tissue disorders: Secondary | ICD-10-CM | POA: Diagnosis not present

## 2022-06-25 DIAGNOSIS — M79605 Pain in left leg: Secondary | ICD-10-CM | POA: Insufficient documentation

## 2022-06-29 DIAGNOSIS — S86819A Strain of other muscle(s) and tendon(s) at lower leg level, unspecified leg, initial encounter: Secondary | ICD-10-CM | POA: Insufficient documentation

## 2022-06-29 DIAGNOSIS — M79662 Pain in left lower leg: Secondary | ICD-10-CM | POA: Insufficient documentation

## 2022-06-29 DIAGNOSIS — E113511 Type 2 diabetes mellitus with proliferative diabetic retinopathy with macular edema, right eye: Secondary | ICD-10-CM | POA: Diagnosis not present

## 2022-06-29 DIAGNOSIS — S86112A Strain of other muscle(s) and tendon(s) of posterior muscle group at lower leg level, left leg, initial encounter: Secondary | ICD-10-CM | POA: Diagnosis not present

## 2022-06-29 DIAGNOSIS — E113412 Type 2 diabetes mellitus with severe nonproliferative diabetic retinopathy with macular edema, left eye: Secondary | ICD-10-CM | POA: Diagnosis not present

## 2022-07-04 ENCOUNTER — Other Ambulatory Visit: Payer: Self-pay

## 2022-07-04 ENCOUNTER — Other Ambulatory Visit: Payer: Self-pay | Admitting: Student

## 2022-07-04 DIAGNOSIS — R252 Cramp and spasm: Secondary | ICD-10-CM | POA: Diagnosis not present

## 2022-07-04 DIAGNOSIS — M79605 Pain in left leg: Secondary | ICD-10-CM | POA: Diagnosis not present

## 2022-07-04 DIAGNOSIS — E1141 Type 2 diabetes mellitus with diabetic mononeuropathy: Secondary | ICD-10-CM

## 2022-07-04 DIAGNOSIS — M25562 Pain in left knee: Secondary | ICD-10-CM | POA: Diagnosis not present

## 2022-07-04 DIAGNOSIS — S79912A Unspecified injury of left hip, initial encounter: Secondary | ICD-10-CM | POA: Diagnosis not present

## 2022-07-04 DIAGNOSIS — G8929 Other chronic pain: Secondary | ICD-10-CM | POA: Diagnosis not present

## 2022-07-04 MED ORDER — VERAPAMIL HCL ER 360 MG PO CP24
ORAL_CAPSULE | ORAL | 3 refills | Status: DC
  Filled 2022-07-04: qty 90, 90d supply, fill #0
  Filled 2022-09-25: qty 90, 90d supply, fill #1
  Filled 2022-11-29: qty 90, 90d supply, fill #2
  Filled 2023-02-25: qty 90, 90d supply, fill #3

## 2022-07-04 MED ORDER — CARVEDILOL 3.125 MG PO TABS
ORAL_TABLET | ORAL | 3 refills | Status: DC
  Filled 2022-07-04 (×2): qty 60, 30d supply, fill #0
  Filled 2022-08-07: qty 60, 30d supply, fill #1
  Filled 2022-09-07: qty 60, 30d supply, fill #2
  Filled 2022-10-15: qty 60, 30d supply, fill #3

## 2022-07-05 ENCOUNTER — Other Ambulatory Visit: Payer: Self-pay

## 2022-07-16 DIAGNOSIS — G4733 Obstructive sleep apnea (adult) (pediatric): Secondary | ICD-10-CM | POA: Diagnosis not present

## 2022-07-18 ENCOUNTER — Other Ambulatory Visit: Payer: Self-pay

## 2022-07-18 DIAGNOSIS — M2550 Pain in unspecified joint: Secondary | ICD-10-CM | POA: Diagnosis not present

## 2022-07-18 DIAGNOSIS — M170 Bilateral primary osteoarthritis of knee: Secondary | ICD-10-CM | POA: Diagnosis not present

## 2022-07-18 DIAGNOSIS — M25562 Pain in left knee: Secondary | ICD-10-CM | POA: Diagnosis not present

## 2022-07-18 DIAGNOSIS — M25561 Pain in right knee: Secondary | ICD-10-CM | POA: Diagnosis not present

## 2022-07-18 DIAGNOSIS — G8929 Other chronic pain: Secondary | ICD-10-CM | POA: Diagnosis not present

## 2022-07-18 DIAGNOSIS — I959 Hypotension, unspecified: Secondary | ICD-10-CM | POA: Diagnosis not present

## 2022-07-19 ENCOUNTER — Other Ambulatory Visit: Payer: Self-pay

## 2022-07-20 ENCOUNTER — Other Ambulatory Visit: Payer: Self-pay

## 2022-07-20 DIAGNOSIS — K7469 Other cirrhosis of liver: Secondary | ICD-10-CM | POA: Diagnosis not present

## 2022-07-20 DIAGNOSIS — K7460 Unspecified cirrhosis of liver: Secondary | ICD-10-CM | POA: Insufficient documentation

## 2022-07-20 MED ORDER — GABAPENTIN 300 MG PO CAPS
ORAL_CAPSULE | ORAL | 0 refills | Status: DC
Start: 2022-07-20 — End: 2022-10-23
  Filled 2022-07-20: qty 450, 90d supply, fill #0

## 2022-07-23 DIAGNOSIS — K7469 Other cirrhosis of liver: Secondary | ICD-10-CM | POA: Diagnosis not present

## 2022-07-23 DIAGNOSIS — K7460 Unspecified cirrhosis of liver: Secondary | ICD-10-CM | POA: Diagnosis not present

## 2022-07-27 DIAGNOSIS — Z23 Encounter for immunization: Secondary | ICD-10-CM | POA: Diagnosis not present

## 2022-07-30 ENCOUNTER — Other Ambulatory Visit: Payer: Self-pay

## 2022-07-30 MED ORDER — OXYCODONE HCL 5 MG PO TABS
ORAL_TABLET | ORAL | 0 refills | Status: DC
  Filled 2022-07-30: qty 15, 5d supply, fill #0

## 2022-07-31 ENCOUNTER — Other Ambulatory Visit: Payer: Self-pay

## 2022-08-03 DIAGNOSIS — G4733 Obstructive sleep apnea (adult) (pediatric): Secondary | ICD-10-CM | POA: Diagnosis not present

## 2022-08-06 DIAGNOSIS — M25561 Pain in right knee: Secondary | ICD-10-CM | POA: Diagnosis not present

## 2022-08-06 DIAGNOSIS — G8929 Other chronic pain: Secondary | ICD-10-CM | POA: Diagnosis not present

## 2022-08-06 DIAGNOSIS — M25562 Pain in left knee: Secondary | ICD-10-CM | POA: Diagnosis not present

## 2022-08-07 ENCOUNTER — Other Ambulatory Visit: Payer: Self-pay

## 2022-08-07 DIAGNOSIS — R682 Dry mouth, unspecified: Secondary | ICD-10-CM | POA: Diagnosis not present

## 2022-08-07 DIAGNOSIS — G4733 Obstructive sleep apnea (adult) (pediatric): Secondary | ICD-10-CM | POA: Diagnosis not present

## 2022-08-07 DIAGNOSIS — Z9989 Dependence on other enabling machines and devices: Secondary | ICD-10-CM | POA: Diagnosis not present

## 2022-08-08 ENCOUNTER — Other Ambulatory Visit: Payer: Self-pay

## 2022-08-08 MED ORDER — OXYCODONE HCL 5 MG PO TABS
ORAL_TABLET | ORAL | 0 refills | Status: DC
  Filled 2022-08-08: qty 20, 5d supply, fill #0

## 2022-08-16 ENCOUNTER — Other Ambulatory Visit: Payer: Self-pay

## 2022-08-16 DIAGNOSIS — M7918 Myalgia, other site: Secondary | ICD-10-CM | POA: Insufficient documentation

## 2022-08-16 DIAGNOSIS — M791 Myalgia, unspecified site: Secondary | ICD-10-CM | POA: Diagnosis not present

## 2022-08-16 DIAGNOSIS — G4733 Obstructive sleep apnea (adult) (pediatric): Secondary | ICD-10-CM | POA: Diagnosis not present

## 2022-08-16 MED ORDER — BACLOFEN 5 MG PO TABS
ORAL_TABLET | ORAL | 1 refills | Status: DC
  Filled 2022-08-16: qty 30, 30d supply, fill #0

## 2022-08-24 DIAGNOSIS — Z23 Encounter for immunization: Secondary | ICD-10-CM | POA: Diagnosis not present

## 2022-08-30 ENCOUNTER — Other Ambulatory Visit: Payer: Self-pay

## 2022-08-31 DIAGNOSIS — E113412 Type 2 diabetes mellitus with severe nonproliferative diabetic retinopathy with macular edema, left eye: Secondary | ICD-10-CM | POA: Diagnosis not present

## 2022-08-31 DIAGNOSIS — E113511 Type 2 diabetes mellitus with proliferative diabetic retinopathy with macular edema, right eye: Secondary | ICD-10-CM | POA: Diagnosis not present

## 2022-09-07 ENCOUNTER — Other Ambulatory Visit: Payer: Self-pay

## 2022-09-07 ENCOUNTER — Emergency Department: Payer: 59

## 2022-09-07 ENCOUNTER — Emergency Department
Admission: EM | Admit: 2022-09-07 | Discharge: 2022-09-07 | Disposition: A | Discharge status: Home / Self Care | Payer: 59 | Attending: Emergency Medicine | Admitting: Emergency Medicine

## 2022-09-07 DIAGNOSIS — B029 Zoster without complications: Secondary | ICD-10-CM | POA: Diagnosis not present

## 2022-09-07 DIAGNOSIS — H5711 Ocular pain, right eye: Secondary | ICD-10-CM | POA: Diagnosis not present

## 2022-09-07 DIAGNOSIS — R519 Headache, unspecified: Secondary | ICD-10-CM | POA: Diagnosis not present

## 2022-09-07 DIAGNOSIS — I2510 Atherosclerotic heart disease of native coronary artery without angina pectoris: Secondary | ICD-10-CM | POA: Insufficient documentation

## 2022-09-07 DIAGNOSIS — B0229 Other postherpetic nervous system involvement: Secondary | ICD-10-CM

## 2022-09-07 LAB — COMPREHENSIVE METABOLIC PANEL
ALT: 20 U/L (ref 0–44)
AST: 24 U/L (ref 15–41)
Albumin: 3.7 g/dL (ref 3.5–5.0)
Alkaline Phosphatase: 78 U/L (ref 38–126)
Anion gap: 5 (ref 5–15)
BUN: 19 mg/dL (ref 8–23)
CO2: 29 mmol/L (ref 22–32)
Calcium: 9.1 mg/dL (ref 8.9–10.3)
Chloride: 103 mmol/L (ref 98–111)
Creatinine, Ser: 0.75 mg/dL (ref 0.61–1.24)
GFR, Estimated: >60 mL/min (ref >60)
Glucose, Bld: 220 mg/dL — ABNORMAL HIGH (ref 70–99)
Potassium: 4 mmol/L (ref 3.5–5.1)
Sodium: 137 mmol/L (ref 135–145)
Total Bilirubin: 1 mg/dL (ref 0.3–1.2)
Total Protein: 7 g/dL (ref 6.5–8.1)

## 2022-09-07 LAB — CBC WITH DIFFERENTIAL/PLATELET
Abs Immature Granulocytes: 0.01 10*3/uL (ref 0.00–0.07)
Basophils Absolute: 0 10*3/uL (ref 0.0–0.1)
Basophils Relative: 0 %
Eosinophils Absolute: 0.2 10*3/uL (ref 0.0–0.5)
Eosinophils Relative: 3 %
HCT: 39.4 % (ref 39.0–52.0)
Hemoglobin: 13.6 g/dL (ref 13.0–17.0)
Immature Granulocytes: 0 %
Lymphocytes Relative: 22 %
Lymphs Abs: 1.2 10*3/uL (ref 0.7–4.0)
MCH: 30.4 pg (ref 26.0–34.0)
MCHC: 34.5 g/dL (ref 30.0–36.0)
MCV: 88.1 fL (ref 80.0–100.0)
Monocytes Absolute: 0.4 10*3/uL (ref 0.1–1.0)
Monocytes Relative: 7 %
Neutro Abs: 3.7 10*3/uL (ref 1.7–7.7)
Neutrophils Relative %: 68 %
Platelets: 122 10*3/uL — ABNORMAL LOW (ref 150–400)
RBC: 4.47 MIL/uL (ref 4.22–5.81)
RDW: 13.2 % (ref 11.5–15.5)
WBC: 5.4 10*3/uL (ref 4.0–10.5)
nRBC: 0 % (ref 0.0–0.2)

## 2022-09-07 LAB — SEDIMENTATION RATE: Sed Rate: 8 mm/hr (ref 0–20)

## 2022-09-07 MED ORDER — FAMCICLOVIR 500 MG PO TABS: 500.0000 mg | ORAL_TABLET | Freq: Three times a day (TID) | ORAL | 0 refills | Status: DC

## 2022-09-07 MED ORDER — IOHEXOL 350 MG/ML SOLN
75.0000 mL | Freq: Once | INTRAVENOUS | Status: AC | PRN
  Administered 2022-09-07: 75 mL via INTRAVENOUS

## 2022-09-07 MED ORDER — ONDANSETRON HCL 4 MG/2ML IJ SOLN
4.0000 mg | Freq: Once | INTRAMUSCULAR | Status: AC
  Administered 2022-09-07: 4 mg via INTRAVENOUS
  Filled 2022-09-07: qty 2

## 2022-09-07 MED ORDER — MORPHINE SULFATE (PF) 4 MG/ML IV SOLN
4.0000 mg | Freq: Once | INTRAVENOUS | Status: AC
  Administered 2022-09-07: 4 mg via INTRAVENOUS
  Filled 2022-09-07: qty 1

## 2022-09-07 MED ORDER — SODIUM CHLORIDE 0.9 % IV BOLUS
1000.0000 mL | Freq: Once | INTRAVENOUS | Status: AC
  Administered 2022-09-07: 1000 mL via INTRAVENOUS

## 2022-09-07 MED ORDER — ERYTHROMYCIN 5 MG/GM OP OINT: 1.0000 | TOPICAL_OINTMENT | Freq: Three times a day (TID) | OPHTHALMIC | 0 refills | Status: DC

## 2022-09-07 MED ORDER — OXYCODONE HCL 5 MG PO TABS: 5.0000 mg | ORAL_TABLET | Freq: Three times a day (TID) | ORAL | 0 refills | Status: AC | PRN

## 2022-09-07 MED ORDER — METHYLPREDNISOLONE 4 MG PO TBPK: ORAL_TABLET | ORAL | 0 refills | Status: DC

## 2022-09-07 NOTE — ED Provider Notes (Signed)
[facility] Provider Note    Event Date/Time   First MD Initiated Contact with Patient 09/07/22 1435     (approximate)   History   Headache (Headache , redness to bilateral eyes x 2 weeks , pt seen at eye doctor for dry eyes )   HPI  Robert Fernandez is a 66 y.o. male with history of coronary artery disease, chronic back pain and migraines presents emergency department complaining of right eye pain.  Patient states been ongoing for about a week.  Worsened today and has some blurred vision.  Patient states he saw his eye doctor last Friday and was told it was dry eyes.  He states it does feel little better when he puts the drops in but now both eyes are little bloodshot.  States pain is at the temporal area on the right side and radiates up to the top of his head.  States felt like a pop in the area couple of days ago.  No rash.  No fever or chills.  No known injury.      Physical Exam   Triage Vital Signs: ED Triage Vitals  Enc Vitals Group     BP 09/07/22 1223 (!) 155/87     Pulse Rate 09/07/22 1223 83     Resp --      Temp 09/07/22 1223 98.7 F (37.1 C)     Temp Source 09/07/22 1223 Oral     SpO2 09/07/22 1223 97 %     Weight 09/07/22 1224 187 lb 6.3 oz (85 kg)     Height 09/07/22 1224 6' (1.829 m)     Head Circumference --      Peak Flow --      Pain Score 09/07/22 1224 6     Pain Loc --      Pain Edu? --      Excl. in [place]? --     Most recent vital signs: Vitals:   09/07/22 1223  BP: (!) 155/87  Pulse: 83  Temp: 98.7 F (37.1 C)  SpO2: 97%     General: Awake, no distress.   CV:  Good peripheral perfusion. regular rate and  rhythm Resp:  Normal effort.  Abd:  No distention.   Other:  PERRL, EOMI, conjunctive are slightly injected, no rash noted, cranial nerves II through XII grossly intact, grips equal bilaterally, no pronator drift noted, patient is ambulatory   ED Results / Procedures / Treatments   Labs (all labs ordered are  listed, but only abnormal results are displayed) Labs Reviewed  COMPREHENSIVE METABOLIC PANEL - Abnormal; Notable for the following components:      Result Value   Glucose, Bld 220 (*)    All other components within normal limits  CBC WITH DIFFERENTIAL/PLATELET - Abnormal; Notable for the following components:   Platelets 122 (*)    All other components within normal limits  SEDIMENTATION RATE     EKG     RADIOLOGY CTA of the head    PROCEDURES:   Procedures   MEDICATIONS ORDERED IN ED: Medications  sodium chloride 0.9 % bolus 1,000 mL (1,000 mLs Intravenous New Bag/Given 09/07/22 1530)  ondansetron (ZOFRAN) injection 4 mg (4 mg Intravenous Given 09/07/22 1531)  morphine (PF) 4 MG/ML injection 4 mg (4 mg Intravenous Given 09/07/22 1533)  iohexol (OMNIPAQUE) 350 MG/ML injection 75 mL (75 mLs Intravenous Contrast Given 09/07/22 1626)     IMPRESSION / MDM / [place] / ED  COURSE  I reviewed the triage vital signs and the nursing notes.                              Differential diagnosis includes, but is not limited to, temporal arteritis, venous or arterial occlusion, CVA, migraine  Patient's presentation is most consistent with acute presentation with potential threat to life or bodily function.   Due to the tenderness along the right temporal area and popping sensation along with blurred vision need to assess patient for CVA.  CBC, metabolic panel, CTA of the head ordered  Patient was given 1 L normal saline IV, morphine 4 mg IV and Zofran 4 mg IV.  Did not want to start with Toradol due to possible CVA   Patient had relief with pain medications.  CBC metabolic panel are reassuring, CTA of the head independently reviewed and interpreted by me as being negative for any acute abnormality.  I did explain all of these findings to the patient.  He does have a small rash on the scalp and I have concerns this could be more of a shingles type rash.  It is in a  dermatome and would be affecting the areas of concern where he has pain.  He was placed on Famvir, Medrol Dosepak, oxycodone, and erythromycin ophthalmic ointment.  He is to follow-up with his regular doctor.  Return emergency department worsening.  He is in agreement treatment plan.  Discharged stable condition.   FINAL CLINICAL IMPRESSION(S) / ED DIAGNOSES   Final diagnoses:  HZV (herpes zoster virus) post herpetic neuralgia  Bad headache     Rx / DC Orders   ED Discharge Orders          Ordered    famciclovir (FAMVIR) 500 MG tablet  3 times daily        09/07/22 1740    methylPREDNISolone (MEDROL DOSEPAK) 4 MG TBPK tablet        09/07/22 1740    erythromycin ophthalmic ointment  3 times daily        09/07/22 1740    oxyCODONE (ROXICODONE) 5 MG immediate release tablet  Every 8 hours PRN        09/07/22 1740             Note:  This document was prepared using Dragon voice recognition software and may include unintentional dictation errors.    Versie Starks, PA-C 09/07/22 1753    Nena Polio, MD 09/08/22 [phone]

## 2022-09-07 NOTE — ED Triage Notes (Signed)
Headache , redness to bilateral eyes x 2 weeks , pt seen at eye doctor for dry eyes

## 2022-09-08 ENCOUNTER — Emergency Department
Admission: EM | Admit: 2022-09-08 | Discharge: 2022-09-08 | Disposition: A | Discharge status: Home / Self Care | Payer: 59 | Attending: Emergency Medicine | Admitting: Emergency Medicine

## 2022-09-08 ENCOUNTER — Other Ambulatory Visit: Payer: Self-pay

## 2022-09-08 ENCOUNTER — Encounter: Payer: Self-pay | Admitting: Emergency Medicine

## 2022-09-08 DIAGNOSIS — H5789 Other specified disorders of eye and adnexa: Secondary | ICD-10-CM | POA: Diagnosis present

## 2022-09-08 DIAGNOSIS — H109 Unspecified conjunctivitis: Secondary | ICD-10-CM | POA: Diagnosis not present

## 2022-09-08 DIAGNOSIS — H1033 Unspecified acute conjunctivitis, bilateral: Secondary | ICD-10-CM | POA: Diagnosis not present

## 2022-09-08 MED ORDER — FLUORESCEIN SODIUM 1 MG OP STRP
1.0000 | ORAL_STRIP | Freq: Once | OPHTHALMIC | Status: DC
  Filled 2022-09-08: qty 1

## 2022-09-08 NOTE — ED Provider Notes (Signed)
   [facility] Provider Note    Event Date/Time   First MD Initiated Contact with Patient 09/08/22 [phone]     (approximate)   History   Eye Pain   HPI  Robert Fernandez is a 66 y.o. male who had come in yesterday and was thought to possibly have shingles.  He had had red eyes and there apparently was a suspicion of shingles in the dermatome of the eyes so I wanted to check and make sure that he had no sign of any shingles in the eye.      Physical Exam   Triage Vital Signs: ED Triage Vitals  Enc Vitals Group     BP 09/08/22 0917 128/69     Pulse Rate 09/08/22 0917 68     Resp 09/08/22 0917 18     Temp 09/08/22 0917 98 F (36.7 C)     Temp src --      SpO2 09/08/22 0917 98 %     Weight 09/08/22 0915 187 lb 6.3 oz (85 kg)     Height 09/08/22 0915 6' (1.829 m)     Head Circumference --      Peak Flow --      Pain Score 09/08/22 0915 7     Pain Loc --      Pain Edu? --      Excl. in [place]? --     Most recent vital signs: Vitals:   09/08/22 0917  BP: 128/69  Pulse: 68  Resp: 18  Temp: 98 F (36.7 C)  SpO2: 98%     General: Awake, no distress.  Eyes: Pupils equal and round.  Some injection bilaterally with fluorescein I do not see any difference between either eye and I do not see any focal uptake that would look like shingles.  I will let the patient go. CV:  Good peripheral perfusion.  Resp:  Normal effort.  Abd:  No distention.     ED Results / Procedures / Treatments   Labs (all labs ordered are listed, but only abnormal results are displayed) Labs Reviewed - No data to display   EKG     RADIOLOGY    PROCEDURES:  Critical Care performed:   Procedures   MEDICATIONS ORDERED IN ED: Medications  fluorescein ophthalmic strip 1 strip (has no administration in time range)     IMPRESSION / MDM / ASSESSMENT AND PLAN / ED COURSE  I reviewed the triage vital signs and the nursing notes.   Differential diagnosis  includes, but is not limited to,   Patient's presentation is most consistent with   I will have patient follow-up with primary care and his eye doctor.  He will return for any further problems.     FINAL CLINICAL IMPRESSION(S) / ED DIAGNOSES   Final diagnoses:  Conjunctivitis of both eyes, unspecified conjunctivitis type     Rx / DC Orders   ED Discharge Orders     None        Note:  This document was prepared using Dragon voice recognition software and may include unintentional dictation errors.   Nena Polio, MD 09/08/22 [phone]

## 2022-09-08 NOTE — Discharge Instructions (Addendum)
I do not see any sign of anything in your cornea as well I looked with the dye.  Please continue the medicine that you were prescribed.  Follow-up with primary care and have your eye doctor keep checking on your eyes if they continue to bother you.  Please return for any worsening of any symptoms.

## 2022-09-08 NOTE — ED Notes (Signed)
Pt seen X1 week ago for eye irritation @ [facility] and was diagnosed with dry eye syndrome. Pt has prior hx of shingles on his back and has been vaccinated with shingrix vaccine. Pt currently has shingles rash on forehead and is being seen in the ED for possible Shingles in eyes.

## 2022-09-10 ENCOUNTER — Other Ambulatory Visit: Payer: Self-pay

## 2022-09-12 ENCOUNTER — Other Ambulatory Visit: Payer: Self-pay

## 2022-09-12 DIAGNOSIS — H40051 Ocular hypertension, right eye: Secondary | ICD-10-CM

## 2022-09-12 DIAGNOSIS — H5711 Ocular pain, right eye: Secondary | ICD-10-CM | POA: Diagnosis not present

## 2022-09-12 DIAGNOSIS — E113511 Type 2 diabetes mellitus with proliferative diabetic retinopathy with macular edema, right eye: Secondary | ICD-10-CM | POA: Diagnosis not present

## 2022-09-12 DIAGNOSIS — H35373 Puckering of macula, bilateral: Secondary | ICD-10-CM | POA: Diagnosis not present

## 2022-09-12 HISTORY — DX: Ocular hypertension, right eye: H40.051

## 2022-09-12 MED ORDER — DORZOLAMIDE HCL-TIMOLOL MAL 2-0.5 % OP SOLN
OPHTHALMIC | 0 refills | Status: DC
  Filled 2022-09-12: qty 10, 60d supply, fill #0

## 2022-09-12 MED ORDER — VALACYCLOVIR HCL 1 G PO TABS
ORAL_TABLET | ORAL | 0 refills | Status: DC
  Filled 2022-09-12: qty 30, 10d supply, fill #0

## 2022-09-12 MED ORDER — PREDNISOLONE ACETATE 1 % OP SUSP
OPHTHALMIC | 3 refills | Status: DC
  Filled 2022-09-12: qty 5, 25d supply, fill #0

## 2022-09-25 ENCOUNTER — Other Ambulatory Visit: Payer: Self-pay

## 2022-10-01 ENCOUNTER — Other Ambulatory Visit: Payer: Self-pay

## 2022-10-01 DIAGNOSIS — E113511 Type 2 diabetes mellitus with proliferative diabetic retinopathy with macular edema, right eye: Secondary | ICD-10-CM | POA: Diagnosis not present

## 2022-10-01 DIAGNOSIS — E113412 Type 2 diabetes mellitus with severe nonproliferative diabetic retinopathy with macular edema, left eye: Secondary | ICD-10-CM | POA: Diagnosis not present

## 2022-10-01 MED ORDER — BRIMONIDINE TARTRATE 0.2 % OP SOLN
OPHTHALMIC | 11 refills | Status: DC
  Filled 2022-10-01: qty 5, 25d supply, fill #0
  Filled 2023-02-19: qty 5, 25d supply, fill #1

## 2022-10-03 DIAGNOSIS — H4053X4 Glaucoma secondary to other eye disorders, bilateral, indeterminate stage: Secondary | ICD-10-CM | POA: Diagnosis not present

## 2022-10-03 DIAGNOSIS — H40051 Ocular hypertension, right eye: Secondary | ICD-10-CM | POA: Diagnosis not present

## 2022-10-03 DIAGNOSIS — E113511 Type 2 diabetes mellitus with proliferative diabetic retinopathy with macular edema, right eye: Secondary | ICD-10-CM | POA: Diagnosis not present

## 2022-10-05 DIAGNOSIS — Z79899 Other long term (current) drug therapy: Secondary | ICD-10-CM | POA: Diagnosis not present

## 2022-10-05 DIAGNOSIS — E113513 Type 2 diabetes mellitus with proliferative diabetic retinopathy with macular edema, bilateral: Secondary | ICD-10-CM | POA: Diagnosis not present

## 2022-10-08 ENCOUNTER — Other Ambulatory Visit: Payer: Self-pay

## 2022-10-08 DIAGNOSIS — E663 Overweight: Secondary | ICD-10-CM | POA: Diagnosis not present

## 2022-10-08 DIAGNOSIS — I2510 Atherosclerotic heart disease of native coronary artery without angina pectoris: Secondary | ICD-10-CM | POA: Diagnosis not present

## 2022-10-08 DIAGNOSIS — M8580 Other specified disorders of bone density and structure, unspecified site: Secondary | ICD-10-CM | POA: Diagnosis not present

## 2022-10-08 DIAGNOSIS — E785 Hyperlipidemia, unspecified: Secondary | ICD-10-CM | POA: Diagnosis not present

## 2022-10-08 DIAGNOSIS — H4053X4 Glaucoma secondary to other eye disorders, bilateral, indeterminate stage: Secondary | ICD-10-CM | POA: Diagnosis not present

## 2022-10-08 DIAGNOSIS — N529 Male erectile dysfunction, unspecified: Secondary | ICD-10-CM | POA: Diagnosis not present

## 2022-10-08 DIAGNOSIS — J45909 Unspecified asthma, uncomplicated: Secondary | ICD-10-CM | POA: Diagnosis not present

## 2022-10-08 DIAGNOSIS — H4089 Other specified glaucoma: Secondary | ICD-10-CM | POA: Diagnosis not present

## 2022-10-08 DIAGNOSIS — I10 Essential (primary) hypertension: Secondary | ICD-10-CM | POA: Diagnosis not present

## 2022-10-08 DIAGNOSIS — H40051 Ocular hypertension, right eye: Secondary | ICD-10-CM | POA: Diagnosis not present

## 2022-10-08 DIAGNOSIS — E113511 Type 2 diabetes mellitus with proliferative diabetic retinopathy with macular edema, right eye: Secondary | ICD-10-CM | POA: Diagnosis not present

## 2022-10-08 MED ORDER — POLYMYXIN B-TRIMETHOPRIM 10000-0.1 UNIT/ML-% OP SOLN
OPHTHALMIC | 0 refills | Status: DC
  Filled 2022-10-08: qty 10, 7d supply, fill #0

## 2022-10-08 MED ORDER — PREDNISOLONE ACETATE 1 % OP SUSP
OPHTHALMIC | 1 refills | Status: DC
  Filled 2022-10-08: qty 5, 10d supply, fill #0
  Filled 2022-11-02: qty 5, 10d supply, fill #1

## 2022-10-09 ENCOUNTER — Other Ambulatory Visit: Payer: Self-pay

## 2022-10-15 ENCOUNTER — Other Ambulatory Visit: Payer: Self-pay

## 2022-10-22 ENCOUNTER — Other Ambulatory Visit: Payer: Self-pay

## 2022-10-22 DIAGNOSIS — E1141 Type 2 diabetes mellitus with diabetic mononeuropathy: Secondary | ICD-10-CM | POA: Diagnosis not present

## 2022-10-23 ENCOUNTER — Other Ambulatory Visit: Payer: Self-pay

## 2022-10-23 MED ORDER — GABAPENTIN 300 MG PO CAPS
ORAL_CAPSULE | ORAL | 0 refills | Status: DC
  Filled 2022-10-23: qty 450, 90d supply, fill #0

## 2022-10-24 DIAGNOSIS — G4733 Obstructive sleep apnea (adult) (pediatric): Secondary | ICD-10-CM | POA: Diagnosis not present

## 2022-10-25 ENCOUNTER — Other Ambulatory Visit: Payer: Self-pay

## 2022-10-26 ENCOUNTER — Other Ambulatory Visit: Payer: Self-pay

## 2022-10-26 DIAGNOSIS — E113513 Type 2 diabetes mellitus with proliferative diabetic retinopathy with macular edema, bilateral: Secondary | ICD-10-CM | POA: Diagnosis not present

## 2022-10-26 MED ORDER — ERYTHROMYCIN 5 MG/GM OP OINT
TOPICAL_OINTMENT | OPHTHALMIC | 1 refills | Status: DC
  Filled 2022-10-26: qty 3.5, 30d supply, fill #0

## 2022-10-26 MED ORDER — ATROPINE SULFATE 1 % OP SOLN
OPHTHALMIC | 0 refills | Status: DC
  Filled 2022-10-26: qty 5, 37d supply, fill #0

## 2022-10-26 MED ORDER — BRIMONIDINE TARTRATE 0.2 % OP SOLN
OPHTHALMIC | 6 refills | Status: DC
  Filled 2022-10-26: qty 5, 37d supply, fill #0
  Filled 2023-01-03: qty 5, 30d supply, fill #1

## 2022-10-26 MED ORDER — PREDNISOLONE ACETATE 1 % OP SUSP
OPHTHALMIC | 1 refills | Status: DC
  Filled 2022-10-26: qty 10, 37d supply, fill #0

## 2022-10-29 ENCOUNTER — Other Ambulatory Visit: Payer: Self-pay

## 2022-11-02 ENCOUNTER — Other Ambulatory Visit: Payer: Self-pay

## 2022-11-02 MED ORDER — DORZOLAMIDE HCL-TIMOLOL MAL 2-0.5 % OP SOLN
OPHTHALMIC | 6 refills | Status: DC
  Filled 2022-11-02: qty 10, 75d supply, fill #0
  Filled 2022-11-02: qty 10, 60d supply, fill #0
  Filled 2023-01-03: qty 10, 60d supply, fill #1
  Filled 2023-04-25: qty 10, 60d supply, fill #2

## 2022-11-14 ENCOUNTER — Other Ambulatory Visit: Payer: Self-pay

## 2022-11-14 MED ORDER — CARVEDILOL 3.125 MG PO TABS
ORAL_TABLET | ORAL | 3 refills | Status: DC
  Filled 2022-11-14: qty 180, 90d supply, fill #0
  Filled 2023-02-25: qty 180, 90d supply, fill #1

## 2022-11-23 DIAGNOSIS — H4053X4 Glaucoma secondary to other eye disorders, bilateral, indeterminate stage: Secondary | ICD-10-CM | POA: Diagnosis not present

## 2022-11-23 DIAGNOSIS — E113513 Type 2 diabetes mellitus with proliferative diabetic retinopathy with macular edema, bilateral: Secondary | ICD-10-CM | POA: Diagnosis not present

## 2022-11-29 ENCOUNTER — Other Ambulatory Visit: Payer: Self-pay

## 2022-12-05 ENCOUNTER — Other Ambulatory Visit: Payer: Self-pay

## 2022-12-10 DIAGNOSIS — Z125 Encounter for screening for malignant neoplasm of prostate: Secondary | ICD-10-CM | POA: Diagnosis not present

## 2022-12-10 DIAGNOSIS — E11319 Type 2 diabetes mellitus with unspecified diabetic retinopathy without macular edema: Secondary | ICD-10-CM | POA: Diagnosis not present

## 2022-12-10 DIAGNOSIS — H409 Unspecified glaucoma: Secondary | ICD-10-CM | POA: Diagnosis not present

## 2022-12-10 DIAGNOSIS — M25511 Pain in right shoulder: Secondary | ICD-10-CM | POA: Diagnosis not present

## 2022-12-10 DIAGNOSIS — I10 Essential (primary) hypertension: Secondary | ICD-10-CM | POA: Diagnosis not present

## 2022-12-10 DIAGNOSIS — Z23 Encounter for immunization: Secondary | ICD-10-CM | POA: Diagnosis not present

## 2022-12-10 DIAGNOSIS — Z0000 Encounter for general adult medical examination without abnormal findings: Secondary | ICD-10-CM | POA: Diagnosis not present

## 2022-12-10 DIAGNOSIS — K7469 Other cirrhosis of liver: Secondary | ICD-10-CM | POA: Diagnosis not present

## 2022-12-10 DIAGNOSIS — E1142 Type 2 diabetes mellitus with diabetic polyneuropathy: Secondary | ICD-10-CM | POA: Diagnosis not present

## 2022-12-18 ENCOUNTER — Other Ambulatory Visit: Payer: Self-pay

## 2022-12-19 ENCOUNTER — Other Ambulatory Visit: Payer: Self-pay

## 2022-12-26 ENCOUNTER — Other Ambulatory Visit: Payer: Self-pay

## 2022-12-28 ENCOUNTER — Other Ambulatory Visit: Payer: Self-pay

## 2022-12-28 MED ORDER — JARDIANCE 25 MG PO TABS
25.0000 mg | ORAL_TABLET | Freq: Every day | ORAL | 3 refills | Status: DC
  Filled 2022-12-28: qty 90, 90d supply, fill #0
  Filled 2023-04-16: qty 90, 90d supply, fill #1
  Filled 2023-07-19: qty 90, 90d supply, fill #2
  Filled 2023-10-23: qty 90, 90d supply, fill #3

## 2023-01-01 ENCOUNTER — Other Ambulatory Visit: Payer: Self-pay

## 2023-01-03 ENCOUNTER — Other Ambulatory Visit: Payer: Self-pay

## 2023-01-04 ENCOUNTER — Other Ambulatory Visit: Payer: Self-pay

## 2023-01-04 DIAGNOSIS — H4053X4 Glaucoma secondary to other eye disorders, bilateral, indeterminate stage: Secondary | ICD-10-CM | POA: Diagnosis not present

## 2023-01-04 DIAGNOSIS — E113513 Type 2 diabetes mellitus with proliferative diabetic retinopathy with macular edema, bilateral: Secondary | ICD-10-CM | POA: Diagnosis not present

## 2023-01-04 MED ORDER — DORZOLAMIDE HCL-TIMOLOL MAL 2-0.5 % OP SOLN
1.0000 [drp] | Freq: Two times a day (BID) | OPHTHALMIC | 11 refills | Status: DC
  Filled 2023-01-04: qty 10, 90d supply, fill #0

## 2023-01-04 MED ORDER — BRIMONIDINE TARTRATE 0.2 % OP SOLN
1.0000 [drp] | Freq: Two times a day (BID) | OPHTHALMIC | 11 refills | Status: DC
  Filled 2023-01-04: qty 5, 50d supply, fill #0
  Filled 2023-03-28: qty 5, 25d supply, fill #0
  Filled 2023-05-15: qty 5, 25d supply, fill #1
  Filled 2023-06-17: qty 5, 25d supply, fill #2
  Filled 2023-07-09: qty 5, 25d supply, fill #3

## 2023-01-07 ENCOUNTER — Other Ambulatory Visit: Payer: Self-pay

## 2023-01-08 ENCOUNTER — Other Ambulatory Visit: Payer: Self-pay

## 2023-01-08 MED ORDER — REPATHA SURECLICK 140 MG/ML ~~LOC~~ SOAJ
140.0000 mg | SUBCUTANEOUS | 0 refills | Status: DC
  Filled 2023-01-08 (×2): qty 2, 28d supply, fill #0

## 2023-01-16 ENCOUNTER — Other Ambulatory Visit: Payer: Self-pay

## 2023-01-17 ENCOUNTER — Other Ambulatory Visit: Payer: Self-pay

## 2023-01-17 MED ORDER — PIOGLITAZONE HCL 15 MG PO TABS
15.0000 mg | ORAL_TABLET | Freq: Every day | ORAL | 3 refills | Status: DC
  Filled 2023-01-17: qty 90, 90d supply, fill #0
  Filled 2023-04-16: qty 90, 90d supply, fill #1

## 2023-01-18 DIAGNOSIS — E113513 Type 2 diabetes mellitus with proliferative diabetic retinopathy with macular edema, bilateral: Secondary | ICD-10-CM | POA: Diagnosis not present

## 2023-01-18 DIAGNOSIS — H52223 Regular astigmatism, bilateral: Secondary | ICD-10-CM | POA: Diagnosis not present

## 2023-01-21 ENCOUNTER — Other Ambulatory Visit: Payer: Self-pay

## 2023-01-21 DIAGNOSIS — E278 Other specified disorders of adrenal gland: Secondary | ICD-10-CM | POA: Diagnosis not present

## 2023-01-21 DIAGNOSIS — E1141 Type 2 diabetes mellitus with diabetic mononeuropathy: Secondary | ICD-10-CM | POA: Diagnosis not present

## 2023-01-21 MED ORDER — UNIFINE PENTIPS PLUS 31G X 6 MM MISC
12 refills | Status: AC
  Filled 2023-01-21: qty 100, 90d supply, fill #0
  Filled 2023-10-23: qty 100, 90d supply, fill #1

## 2023-01-21 MED ORDER — FREESTYLE LIBRE 3 SENSOR MISC
11 refills | Status: DC
  Filled 2023-01-21: qty 2, 28d supply, fill #0

## 2023-01-21 MED ORDER — INSULIN DEGLUDEC FLEXTOUCH 100 UNIT/ML ~~LOC~~ SOPN
20.0000 [IU] | PEN_INJECTOR | Freq: Every evening | SUBCUTANEOUS | 12 refills | Status: DC
  Filled 2023-01-21: qty 15, 75d supply, fill #0
  Filled 2023-03-21: qty 15, 75d supply, fill #1
  Filled 2023-06-03: qty 15, 75d supply, fill #2
  Filled 2023-10-23: qty 15, 75d supply, fill #3
  Filled 2023-12-02: qty 15, 75d supply, fill #4

## 2023-01-24 ENCOUNTER — Other Ambulatory Visit: Payer: Self-pay

## 2023-01-24 MED ORDER — GABAPENTIN 300 MG PO CAPS
ORAL_CAPSULE | ORAL | 0 refills | Status: DC
  Filled 2023-01-24: qty 450, 90d supply, fill #0

## 2023-02-06 DIAGNOSIS — H02831 Dermatochalasis of right upper eyelid: Secondary | ICD-10-CM | POA: Diagnosis not present

## 2023-02-06 DIAGNOSIS — H57813 Brow ptosis, bilateral: Secondary | ICD-10-CM | POA: Diagnosis not present

## 2023-02-06 DIAGNOSIS — Z83518 Family history of other specified eye disorder: Secondary | ICD-10-CM | POA: Diagnosis not present

## 2023-02-06 DIAGNOSIS — H18413 Arcus senilis, bilateral: Secondary | ICD-10-CM | POA: Diagnosis not present

## 2023-02-06 DIAGNOSIS — H02834 Dermatochalasis of left upper eyelid: Secondary | ICD-10-CM | POA: Diagnosis not present

## 2023-02-06 DIAGNOSIS — Z961 Presence of intraocular lens: Secondary | ICD-10-CM | POA: Diagnosis not present

## 2023-02-14 ENCOUNTER — Other Ambulatory Visit: Payer: Self-pay

## 2023-02-14 MED ORDER — FREESTYLE LIBRE 14 DAY SENSOR MISC
3 refills | Status: AC
  Filled 2023-02-15: qty 2, 28d supply, fill #0
  Filled 2023-03-11: qty 2, 28d supply, fill #1
  Filled 2023-05-10: qty 2, 28d supply, fill #2
  Filled 2023-06-03 (×2): qty 2, 28d supply, fill #3
  Filled 2023-07-19: qty 2, 28d supply, fill #4
  Filled 2023-09-24: qty 2, 28d supply, fill #5
  Filled 2023-11-12: qty 2, 28d supply, fill #6
  Filled 2023-12-13: qty 2, 28d supply, fill #7
  Filled 2024-01-30: qty 2, 28d supply, fill #8

## 2023-02-14 MED ORDER — FREESTYLE LIBRE 14 DAY READER DEVI: 0 refills | Status: AC

## 2023-02-15 ENCOUNTER — Other Ambulatory Visit: Payer: Self-pay

## 2023-02-19 ENCOUNTER — Other Ambulatory Visit: Payer: Self-pay

## 2023-02-25 ENCOUNTER — Other Ambulatory Visit: Payer: Self-pay

## 2023-03-05 DIAGNOSIS — Z794 Long term (current) use of insulin: Secondary | ICD-10-CM | POA: Diagnosis not present

## 2023-03-05 DIAGNOSIS — E1141 Type 2 diabetes mellitus with diabetic mononeuropathy: Secondary | ICD-10-CM | POA: Diagnosis not present

## 2023-03-11 ENCOUNTER — Other Ambulatory Visit: Payer: Self-pay

## 2023-03-18 DIAGNOSIS — H02413 Mechanical ptosis of bilateral eyelids: Secondary | ICD-10-CM | POA: Diagnosis not present

## 2023-03-18 DIAGNOSIS — H02834 Dermatochalasis of left upper eyelid: Secondary | ICD-10-CM | POA: Diagnosis not present

## 2023-03-18 DIAGNOSIS — Z7984 Long term (current) use of oral hypoglycemic drugs: Secondary | ICD-10-CM | POA: Diagnosis not present

## 2023-03-18 DIAGNOSIS — E119 Type 2 diabetes mellitus without complications: Secondary | ICD-10-CM | POA: Diagnosis not present

## 2023-03-18 DIAGNOSIS — Z885 Allergy status to narcotic agent status: Secondary | ICD-10-CM | POA: Diagnosis not present

## 2023-03-18 DIAGNOSIS — H02831 Dermatochalasis of right upper eyelid: Secondary | ICD-10-CM | POA: Diagnosis not present

## 2023-03-18 DIAGNOSIS — I10 Essential (primary) hypertension: Secondary | ICD-10-CM | POA: Diagnosis not present

## 2023-03-18 DIAGNOSIS — Z794 Long term (current) use of insulin: Secondary | ICD-10-CM | POA: Diagnosis not present

## 2023-03-18 DIAGNOSIS — Z87891 Personal history of nicotine dependence: Secondary | ICD-10-CM | POA: Diagnosis not present

## 2023-03-18 DIAGNOSIS — Z880 Allergy status to penicillin: Secondary | ICD-10-CM | POA: Diagnosis not present

## 2023-03-20 DIAGNOSIS — Z951 Presence of aortocoronary bypass graft: Secondary | ICD-10-CM | POA: Diagnosis not present

## 2023-03-20 DIAGNOSIS — R0789 Other chest pain: Secondary | ICD-10-CM | POA: Diagnosis not present

## 2023-03-20 DIAGNOSIS — K7469 Other cirrhosis of liver: Secondary | ICD-10-CM | POA: Diagnosis not present

## 2023-03-21 ENCOUNTER — Other Ambulatory Visit: Payer: Self-pay

## 2023-03-22 ENCOUNTER — Other Ambulatory Visit: Payer: Self-pay

## 2023-03-28 ENCOUNTER — Other Ambulatory Visit: Payer: Self-pay

## 2023-03-28 MED ORDER — OMEPRAZOLE 40 MG PO CPDR
40.0000 mg | DELAYED_RELEASE_CAPSULE | Freq: Two times a day (BID) | ORAL | 3 refills | Status: DC
  Filled 2023-03-28: qty 180, 90d supply, fill #0
  Filled 2023-09-23: qty 180, 90d supply, fill #1
  Filled 2024-02-24: qty 180, 90d supply, fill #2

## 2023-03-29 ENCOUNTER — Other Ambulatory Visit: Payer: Self-pay

## 2023-03-29 DIAGNOSIS — M79605 Pain in left leg: Secondary | ICD-10-CM | POA: Diagnosis not present

## 2023-03-29 DIAGNOSIS — R799 Abnormal finding of blood chemistry, unspecified: Secondary | ICD-10-CM | POA: Diagnosis not present

## 2023-03-29 DIAGNOSIS — K7460 Unspecified cirrhosis of liver: Secondary | ICD-10-CM | POA: Diagnosis not present

## 2023-03-29 DIAGNOSIS — R162 Hepatomegaly with splenomegaly, not elsewhere classified: Secondary | ICD-10-CM | POA: Diagnosis not present

## 2023-03-29 DIAGNOSIS — K760 Fatty (change of) liver, not elsewhere classified: Secondary | ICD-10-CM | POA: Diagnosis not present

## 2023-03-29 DIAGNOSIS — I10 Essential (primary) hypertension: Secondary | ICD-10-CM | POA: Diagnosis not present

## 2023-03-29 DIAGNOSIS — R188 Other ascites: Secondary | ICD-10-CM | POA: Diagnosis not present

## 2023-03-29 DIAGNOSIS — K7469 Other cirrhosis of liver: Secondary | ICD-10-CM | POA: Diagnosis not present

## 2023-03-29 DIAGNOSIS — Z86718 Personal history of other venous thrombosis and embolism: Secondary | ICD-10-CM | POA: Diagnosis not present

## 2023-03-29 MED ORDER — CARVEDILOL 6.25 MG PO TABS
6.2500 mg | ORAL_TABLET | Freq: Two times a day (BID) | ORAL | 3 refills | Status: DC
  Filled 2023-03-29: qty 180, 90d supply, fill #0
  Filled 2023-06-28: qty 180, 90d supply, fill #1
  Filled 2023-10-07: qty 180, 90d supply, fill #2
  Filled 2024-01-08: qty 180, 90d supply, fill #3

## 2023-03-31 ENCOUNTER — Other Ambulatory Visit: Payer: Self-pay

## 2023-03-31 MED ORDER — SPIRONOLACTONE 50 MG PO TABS
50.0000 mg | ORAL_TABLET | Freq: Every day | ORAL | 3 refills | Status: DC
  Filled 2023-03-31: qty 30, 30d supply, fill #0
  Filled 2023-04-29: qty 30, 30d supply, fill #1
  Filled 2023-06-03: qty 30, 30d supply, fill #2
  Filled 2023-07-02: qty 30, 30d supply, fill #3

## 2023-03-31 MED ORDER — FUROSEMIDE 20 MG PO TABS
20.0000 mg | ORAL_TABLET | Freq: Every day | ORAL | 3 refills | Status: DC
  Filled 2023-03-31: qty 30, 30d supply, fill #0
  Filled 2023-04-29: qty 30, 30d supply, fill #1
  Filled 2023-06-03: qty 30, 30d supply, fill #2
  Filled 2023-07-02: qty 30, 30d supply, fill #3

## 2023-04-01 ENCOUNTER — Other Ambulatory Visit: Payer: Self-pay

## 2023-04-02 ENCOUNTER — Other Ambulatory Visit: Payer: Self-pay

## 2023-04-05 DIAGNOSIS — M7989 Other specified soft tissue disorders: Secondary | ICD-10-CM | POA: Diagnosis not present

## 2023-04-05 DIAGNOSIS — M79605 Pain in left leg: Secondary | ICD-10-CM | POA: Diagnosis not present

## 2023-04-09 ENCOUNTER — Other Ambulatory Visit: Payer: Self-pay

## 2023-04-09 DIAGNOSIS — Z8709 Personal history of other diseases of the respiratory system: Secondary | ICD-10-CM | POA: Diagnosis not present

## 2023-04-09 DIAGNOSIS — J209 Acute bronchitis, unspecified: Secondary | ICD-10-CM | POA: Diagnosis not present

## 2023-04-09 MED ORDER — DOXYCYCLINE HYCLATE 100 MG PO CAPS
100.0000 mg | ORAL_CAPSULE | Freq: Two times a day (BID) | ORAL | 0 refills | Status: DC
  Filled 2023-04-09: qty 14, 7d supply, fill #0

## 2023-04-09 MED ORDER — BENZONATATE 200 MG PO CAPS
200.0000 mg | ORAL_CAPSULE | Freq: Three times a day (TID) | ORAL | 0 refills | Status: DC | PRN
  Filled 2023-04-09: qty 20, 7d supply, fill #0

## 2023-04-09 MED ORDER — ALBUTEROL SULFATE HFA 108 (90 BASE) MCG/ACT IN AERS
2.0000 | INHALATION_SPRAY | RESPIRATORY_TRACT | 0 refills | Status: DC | PRN
  Filled 2023-04-09: qty 6.7, 30d supply, fill #0

## 2023-04-12 DIAGNOSIS — R1012 Left upper quadrant pain: Secondary | ICD-10-CM | POA: Diagnosis not present

## 2023-04-12 DIAGNOSIS — C220 Liver cell carcinoma: Secondary | ICD-10-CM | POA: Diagnosis not present

## 2023-04-12 DIAGNOSIS — K7460 Unspecified cirrhosis of liver: Secondary | ICD-10-CM | POA: Diagnosis not present

## 2023-04-13 DIAGNOSIS — G4733 Obstructive sleep apnea (adult) (pediatric): Secondary | ICD-10-CM | POA: Diagnosis not present

## 2023-04-16 ENCOUNTER — Other Ambulatory Visit: Payer: Self-pay

## 2023-04-25 ENCOUNTER — Other Ambulatory Visit: Payer: Self-pay

## 2023-04-29 ENCOUNTER — Other Ambulatory Visit: Payer: Self-pay

## 2023-04-29 MED ORDER — GABAPENTIN 300 MG PO CAPS
ORAL_CAPSULE | ORAL | 0 refills | Status: DC
  Filled 2023-04-29: qty 360, 72d supply, fill #0

## 2023-05-10 ENCOUNTER — Other Ambulatory Visit: Payer: Self-pay

## 2023-05-13 ENCOUNTER — Other Ambulatory Visit: Payer: Self-pay

## 2023-05-13 DIAGNOSIS — R0789 Other chest pain: Secondary | ICD-10-CM | POA: Diagnosis not present

## 2023-05-13 DIAGNOSIS — M545 Low back pain, unspecified: Secondary | ICD-10-CM | POA: Diagnosis not present

## 2023-05-13 DIAGNOSIS — M546 Pain in thoracic spine: Secondary | ICD-10-CM | POA: Diagnosis not present

## 2023-05-13 DIAGNOSIS — G4733 Obstructive sleep apnea (adult) (pediatric): Secondary | ICD-10-CM | POA: Diagnosis not present

## 2023-05-13 DIAGNOSIS — M549 Dorsalgia, unspecified: Secondary | ICD-10-CM | POA: Diagnosis not present

## 2023-05-13 DIAGNOSIS — M47816 Spondylosis without myelopathy or radiculopathy, lumbar region: Secondary | ICD-10-CM | POA: Diagnosis not present

## 2023-05-13 DIAGNOSIS — M25562 Pain in left knee: Secondary | ICD-10-CM | POA: Diagnosis not present

## 2023-05-13 MED ORDER — TRAMADOL HCL 50 MG PO TABS
50.0000 mg | ORAL_TABLET | Freq: Three times a day (TID) | ORAL | 0 refills | Status: DC | PRN
  Filled 2023-05-13: qty 15, 5d supply, fill #0

## 2023-05-13 MED ORDER — BACLOFEN 5 MG PO TABS
5.0000 mg | ORAL_TABLET | Freq: Three times a day (TID) | ORAL | 0 refills | Status: DC
  Filled 2023-05-13: qty 30, 10d supply, fill #0

## 2023-05-13 MED ORDER — PREDNISONE 10 MG PO TABS
10.0000 mg | ORAL_TABLET | Freq: Two times a day (BID) | ORAL | 0 refills | Status: DC
  Filled 2023-05-13: qty 10, 5d supply, fill #0

## 2023-05-15 ENCOUNTER — Other Ambulatory Visit: Payer: Self-pay

## 2023-05-17 ENCOUNTER — Other Ambulatory Visit: Payer: Self-pay

## 2023-05-17 DIAGNOSIS — H4053X4 Glaucoma secondary to other eye disorders, bilateral, indeterminate stage: Secondary | ICD-10-CM | POA: Diagnosis not present

## 2023-05-17 MED ORDER — BRIMONIDINE TARTRATE 0.2 % OP SOLN
1.0000 [drp] | Freq: Two times a day (BID) | OPHTHALMIC | 11 refills | Status: DC
  Filled 2023-05-17: qty 10, 90d supply, fill #0
  Filled 2023-08-21: qty 5, 25d supply, fill #0
  Filled 2023-10-10: qty 5, 25d supply, fill #1
  Filled 2023-10-23: qty 10, 50d supply, fill #2
  Filled 2023-10-30 – 2023-11-18 (×2): qty 5, 25d supply, fill #2

## 2023-05-27 ENCOUNTER — Other Ambulatory Visit: Payer: Self-pay

## 2023-05-27 MED ORDER — VERAPAMIL HCL ER 360 MG PO CP24
360.0000 mg | ORAL_CAPSULE | Freq: Every day | ORAL | 0 refills | Status: DC
  Filled 2023-05-27: qty 30, 30d supply, fill #0

## 2023-06-03 ENCOUNTER — Other Ambulatory Visit: Payer: Self-pay

## 2023-06-11 ENCOUNTER — Encounter: Payer: Self-pay | Admitting: *Deleted

## 2023-06-11 ENCOUNTER — Emergency Department: Payer: Commercial Managed Care - PPO

## 2023-06-11 ENCOUNTER — Emergency Department
Admission: EM | Admit: 2023-06-11 | Discharge: 2023-06-11 | Disposition: A | Discharge status: Home / Self Care | Payer: Commercial Managed Care - PPO | Attending: Emergency Medicine | Admitting: Emergency Medicine

## 2023-06-11 ENCOUNTER — Other Ambulatory Visit: Payer: Self-pay

## 2023-06-11 DIAGNOSIS — W208XXA Other cause of strike by thrown, projected or falling object, initial encounter: Secondary | ICD-10-CM | POA: Diagnosis not present

## 2023-06-11 DIAGNOSIS — E119 Type 2 diabetes mellitus without complications: Secondary | ICD-10-CM | POA: Diagnosis not present

## 2023-06-11 DIAGNOSIS — I2510 Atherosclerotic heart disease of native coronary artery without angina pectoris: Secondary | ICD-10-CM | POA: Insufficient documentation

## 2023-06-11 DIAGNOSIS — S90812A Abrasion, left foot, initial encounter: Secondary | ICD-10-CM | POA: Diagnosis not present

## 2023-06-11 DIAGNOSIS — M79672 Pain in left foot: Secondary | ICD-10-CM | POA: Diagnosis not present

## 2023-06-11 NOTE — Discharge Instructions (Signed)
Apply ice to the affected area as needed.

## 2023-06-11 NOTE — ED Triage Notes (Signed)
Pt dropped a piece of steel onto left foot tonight.  Abrasion to top of foot.  Pt alert.

## 2023-06-11 NOTE — ED Provider Notes (Signed)
[facility] Provider Note    Event Date/Time   First MD Initiated Contact with Patient 06/11/23 2054     (approximate)   History   Foot Pain   HPI  Robert Fernandez is a 67 y.o. male with PMH of CAD and diabetes who presents for evaluation of foot pain.  Patient states that he was moving things in the garage when a wood splitter fell on his foot.  He has had localized pain to the top of his foot where it struck.  Patient is ambulatory.  He states he had a tetanus shot within the last 5 years.      Physical Exam   Triage Vital Signs: ED Triage Vitals  Enc Vitals Group     BP 06/11/23 2049 (!) 149/72     Pulse Rate 06/11/23 2049 71     Resp 06/11/23 2049 20     Temp 06/11/23 2049 97.8 F (36.6 C)     Temp Source 06/11/23 2049 Oral     SpO2 06/11/23 2049 95 %     Weight 06/11/23 2048 212 lb (96.2 kg)     Height 06/11/23 2048 6' (1.829 m)     Head Circumference --      Peak Flow --      Pain Score 06/11/23 2048 8     Pain Loc --      Pain Edu? --      Excl. in [place]? --     Most recent vital signs: Vitals:   06/11/23 2049  BP: (!) 149/72  Pulse: 71  Resp: 20  Temp: 97.8 F (36.6 C)  SpO2: 95%    General: Awake, no distress.  CV:  Good peripheral perfusion.  Resp:  Normal effort.  Abd:  No distention.  Other:  Abrasion to the top of left foot, TTP surrounding abrasion, dorsalis pedis pulse 2+ and regular.   ED Results / Procedures / Treatments   Labs (all labs ordered are listed, but only abnormal results are displayed) Labs Reviewed - No data to display   RADIOLOGY  Left foot x-rays obtained in the ED today.  I interpreted the images as well as reviewed the radiologist report.   PROCEDURES:  Critical Care performed: No  Procedures   MEDICATIONS ORDERED IN ED: Medications - No data to display   IMPRESSION / MDM / ASSESSMENT AND PLAN / ED COURSE  I reviewed the triage vital signs and the nursing notes.                               Differential diagnosis includes, but is not limited to, abrasion, contusion, fracture, crush injury.  Patient's presentation is most consistent with acute complicated illness / injury requiring diagnostic workup.  Patient presented to the ED with left foot pain after a wood splitter fell on the top of his foot.  On exam patient has an abrasion to the top of his left foot and is TTP surrounding the area.  He is neurovascularly intact.  Patient stated he has a history of easily breakable bones and wanted an x-ray.  I felt this was reasonable so left foot x-rays were obtained.  I interpreted the images as well as reviewed the radiologist report.  There was no acute fracture but it did show osteopenia and degenerative changes.  Patient's abrasion was cleaned with saline and bandage was applied.  I stated that patient can  apply ice to the area to help reduce his pain.  All questions were answered and patient was stable at discharge.     FINAL CLINICAL IMPRESSION(S) / ED DIAGNOSES   Final diagnoses:  Abrasion of left foot, initial encounter     Rx / DC Orders   ED Discharge Orders     None        Note:  This document was prepared using Dragon voice recognition software and may include unintentional dictation errors.   Cameron Ali, PA-C 06/11/23 2228    Georga Hacking, MD 06/11/23 2253

## 2023-06-14 ENCOUNTER — Other Ambulatory Visit: Payer: Self-pay

## 2023-06-14 DIAGNOSIS — L03116 Cellulitis of left lower limb: Secondary | ICD-10-CM | POA: Diagnosis not present

## 2023-06-14 DIAGNOSIS — K7469 Other cirrhosis of liver: Secondary | ICD-10-CM | POA: Diagnosis not present

## 2023-06-14 DIAGNOSIS — I10 Essential (primary) hypertension: Secondary | ICD-10-CM | POA: Diagnosis not present

## 2023-06-14 DIAGNOSIS — R799 Abnormal finding of blood chemistry, unspecified: Secondary | ICD-10-CM | POA: Diagnosis not present

## 2023-06-14 DIAGNOSIS — R188 Other ascites: Secondary | ICD-10-CM | POA: Diagnosis not present

## 2023-06-14 DIAGNOSIS — E1141 Type 2 diabetes mellitus with diabetic mononeuropathy: Secondary | ICD-10-CM | POA: Diagnosis not present

## 2023-06-14 MED ORDER — SULFAMETHOXAZOLE-TRIMETHOPRIM 800-160 MG PO TABS
1.0000 | ORAL_TABLET | Freq: Two times a day (BID) | ORAL | 0 refills | Status: DC
  Filled 2023-06-14: qty 14, 7d supply, fill #0

## 2023-06-17 ENCOUNTER — Other Ambulatory Visit: Payer: Self-pay

## 2023-06-18 ENCOUNTER — Other Ambulatory Visit: Payer: Self-pay

## 2023-06-18 DIAGNOSIS — G43909 Migraine, unspecified, not intractable, without status migrainosus: Secondary | ICD-10-CM | POA: Diagnosis not present

## 2023-06-18 MED ORDER — GABAPENTIN 300 MG PO CAPS
ORAL_CAPSULE | ORAL | 3 refills | Status: DC
  Filled 2023-06-18 – 2023-07-09 (×2): qty 450, 90d supply, fill #0
  Filled 2023-10-07: qty 450, 90d supply, fill #1

## 2023-06-18 MED ORDER — VERAPAMIL HCL ER 360 MG PO CP24
360.0000 mg | ORAL_CAPSULE | Freq: Every evening | ORAL | 3 refills | Status: DC
  Filled 2023-06-18: qty 90, 90d supply, fill #0
  Filled 2023-09-23: qty 90, 90d supply, fill #1
  Filled 2023-12-26: qty 90, 90d supply, fill #2
  Filled 2024-03-23: qty 30, 30d supply, fill #3
  Filled 2024-03-24: qty 90, 90d supply, fill #3

## 2023-06-18 MED ORDER — METHOCARBAMOL 750 MG PO TABS
750.0000 mg | ORAL_TABLET | Freq: Two times a day (BID) | ORAL | 11 refills | Status: DC | PRN
  Filled 2023-06-18: qty 60, 30d supply, fill #0
  Filled 2023-07-16: qty 60, 30d supply, fill #1
  Filled 2023-08-21: qty 60, 30d supply, fill #2
  Filled 2023-09-20: qty 60, 30d supply, fill #3
  Filled 2023-10-23: qty 60, 30d supply, fill #4
  Filled 2023-11-18: qty 60, 30d supply, fill #5
  Filled 2023-12-17: qty 60, 30d supply, fill #6
  Filled 2024-01-20: qty 60, 30d supply, fill #7
  Filled 2024-02-17: qty 60, 30d supply, fill #8
  Filled 2024-03-23: qty 60, 30d supply, fill #9
  Filled 2024-04-21: qty 60, 30d supply, fill #10
  Filled 2024-05-19: qty 60, 30d supply, fill #11

## 2023-06-19 ENCOUNTER — Other Ambulatory Visit: Payer: Self-pay

## 2023-06-20 ENCOUNTER — Other Ambulatory Visit: Payer: Self-pay

## 2023-06-24 ENCOUNTER — Other Ambulatory Visit: Payer: Self-pay

## 2023-06-24 DIAGNOSIS — E113513 Type 2 diabetes mellitus with proliferative diabetic retinopathy with macular edema, bilateral: Secondary | ICD-10-CM | POA: Diagnosis not present

## 2023-06-24 DIAGNOSIS — H35373 Puckering of macula, bilateral: Secondary | ICD-10-CM | POA: Diagnosis not present

## 2023-06-25 ENCOUNTER — Other Ambulatory Visit: Payer: Self-pay

## 2023-06-25 DIAGNOSIS — E1141 Type 2 diabetes mellitus with diabetic mononeuropathy: Secondary | ICD-10-CM | POA: Diagnosis not present

## 2023-06-25 DIAGNOSIS — Z794 Long term (current) use of insulin: Secondary | ICD-10-CM | POA: Diagnosis not present

## 2023-06-25 MED ORDER — PIOGLITAZONE HCL 30 MG PO TABS
30.0000 mg | ORAL_TABLET | Freq: Every day | ORAL | 3 refills | Status: DC
  Filled 2023-06-25: qty 90, 90d supply, fill #0
  Filled 2023-10-01: qty 90, 90d supply, fill #1
  Filled 2023-12-13: qty 90, 90d supply, fill #2
  Filled 2024-03-20: qty 90, 90d supply, fill #3

## 2023-06-25 MED ORDER — OZEMPIC (0.25 OR 0.5 MG/DOSE) 2 MG/3ML ~~LOC~~ SOPN
0.2500 mg | PEN_INJECTOR | SUBCUTANEOUS | 5 refills | Status: DC
  Filled 2023-06-25: qty 3, 30d supply, fill #0

## 2023-07-09 ENCOUNTER — Other Ambulatory Visit: Payer: Self-pay

## 2023-07-16 ENCOUNTER — Other Ambulatory Visit: Payer: Self-pay

## 2023-07-17 DIAGNOSIS — H4051X3 Glaucoma secondary to other eye disorders, right eye, severe stage: Secondary | ICD-10-CM | POA: Diagnosis not present

## 2023-07-17 DIAGNOSIS — E113513 Type 2 diabetes mellitus with proliferative diabetic retinopathy with macular edema, bilateral: Secondary | ICD-10-CM | POA: Diagnosis not present

## 2023-07-17 DIAGNOSIS — H4052X4 Glaucoma secondary to other eye disorders, left eye, indeterminate stage: Secondary | ICD-10-CM | POA: Diagnosis not present

## 2023-07-17 DIAGNOSIS — H4053X4 Glaucoma secondary to other eye disorders, bilateral, indeterminate stage: Secondary | ICD-10-CM | POA: Diagnosis not present

## 2023-07-19 ENCOUNTER — Other Ambulatory Visit: Payer: Self-pay

## 2023-07-19 DIAGNOSIS — Z01818 Encounter for other preprocedural examination: Secondary | ICD-10-CM | POA: Insufficient documentation

## 2023-07-19 DIAGNOSIS — H4052X4 Glaucoma secondary to other eye disorders, left eye, indeterminate stage: Secondary | ICD-10-CM | POA: Insufficient documentation

## 2023-07-22 ENCOUNTER — Other Ambulatory Visit: Payer: Self-pay

## 2023-07-22 DIAGNOSIS — J45909 Unspecified asthma, uncomplicated: Secondary | ICD-10-CM | POA: Diagnosis not present

## 2023-07-22 DIAGNOSIS — Z8673 Personal history of transient ischemic attack (TIA), and cerebral infarction without residual deficits: Secondary | ICD-10-CM | POA: Diagnosis not present

## 2023-07-22 DIAGNOSIS — G4733 Obstructive sleep apnea (adult) (pediatric): Secondary | ICD-10-CM | POA: Diagnosis not present

## 2023-07-22 DIAGNOSIS — H4052X4 Glaucoma secondary to other eye disorders, left eye, indeterminate stage: Secondary | ICD-10-CM | POA: Diagnosis not present

## 2023-07-22 DIAGNOSIS — I2510 Atherosclerotic heart disease of native coronary artery without angina pectoris: Secondary | ICD-10-CM | POA: Diagnosis not present

## 2023-07-22 DIAGNOSIS — Z951 Presence of aortocoronary bypass graft: Secondary | ICD-10-CM | POA: Diagnosis not present

## 2023-07-22 DIAGNOSIS — E113511 Type 2 diabetes mellitus with proliferative diabetic retinopathy with macular edema, right eye: Secondary | ICD-10-CM | POA: Diagnosis not present

## 2023-07-22 DIAGNOSIS — I10 Essential (primary) hypertension: Secondary | ICD-10-CM | POA: Diagnosis not present

## 2023-07-22 DIAGNOSIS — Z880 Allergy status to penicillin: Secondary | ICD-10-CM | POA: Diagnosis not present

## 2023-07-22 DIAGNOSIS — E11319 Type 2 diabetes mellitus with unspecified diabetic retinopathy without macular edema: Secondary | ICD-10-CM | POA: Diagnosis not present

## 2023-07-22 MED ORDER — PREDNISOLONE ACETATE 1 % OP SUSP
1.0000 [drp] | Freq: Four times a day (QID) | OPHTHALMIC | 1 refills | Status: DC
  Filled 2023-07-22: qty 5, 25d supply, fill #0
  Filled 2023-08-21: qty 5, 25d supply, fill #1

## 2023-07-22 MED ORDER — OFLOXACIN 0.3 % OP SOLN
1.0000 [drp] | Freq: Four times a day (QID) | OPHTHALMIC | 0 refills | Status: DC
  Filled 2023-07-22: qty 5, 25d supply, fill #0

## 2023-07-23 ENCOUNTER — Other Ambulatory Visit: Payer: Self-pay

## 2023-07-23 MED ORDER — OXYCODONE HCL 5 MG PO TABS
5.0000 mg | ORAL_TABLET | ORAL | 0 refills | Status: DC | PRN
  Filled 2023-07-23: qty 6, 1d supply, fill #0

## 2023-08-08 ENCOUNTER — Other Ambulatory Visit: Payer: Self-pay

## 2023-08-08 DIAGNOSIS — H8309 Labyrinthitis, unspecified ear: Secondary | ICD-10-CM | POA: Diagnosis not present

## 2023-08-09 ENCOUNTER — Other Ambulatory Visit: Payer: Self-pay

## 2023-08-12 ENCOUNTER — Other Ambulatory Visit: Payer: Self-pay

## 2023-08-13 ENCOUNTER — Other Ambulatory Visit: Payer: Self-pay

## 2023-08-14 ENCOUNTER — Other Ambulatory Visit: Payer: Self-pay

## 2023-08-15 ENCOUNTER — Other Ambulatory Visit: Payer: Self-pay

## 2023-08-15 DIAGNOSIS — R188 Other ascites: Secondary | ICD-10-CM | POA: Diagnosis not present

## 2023-08-15 DIAGNOSIS — R42 Dizziness and giddiness: Secondary | ICD-10-CM | POA: Diagnosis not present

## 2023-08-15 DIAGNOSIS — G43909 Migraine, unspecified, not intractable, without status migrainosus: Secondary | ICD-10-CM | POA: Diagnosis not present

## 2023-08-15 MED ORDER — SUMATRIPTAN SUCCINATE 50 MG PO TABS
50.0000 mg | ORAL_TABLET | ORAL | 2 refills | Status: DC
  Filled 2023-08-15: qty 10, 30d supply, fill #0

## 2023-08-15 MED ORDER — SPIRONOLACTONE 50 MG PO TABS
50.0000 mg | ORAL_TABLET | Freq: Every day | ORAL | 0 refills | Status: DC
  Filled 2023-08-15: qty 90, 90d supply, fill #0

## 2023-08-15 MED ORDER — FUROSEMIDE 20 MG PO TABS
20.0000 mg | ORAL_TABLET | Freq: Every day | ORAL | 0 refills | Status: DC
  Filled 2023-08-15: qty 90, 90d supply, fill #0

## 2023-08-16 ENCOUNTER — Other Ambulatory Visit: Payer: Self-pay

## 2023-08-19 ENCOUNTER — Other Ambulatory Visit: Payer: Self-pay

## 2023-08-19 MED ORDER — MECLIZINE HCL 25 MG PO TABS
25.0000 mg | ORAL_TABLET | Freq: Three times a day (TID) | ORAL | 1 refills | Status: DC | PRN
  Filled 2023-08-19: qty 30, 10d supply, fill #0

## 2023-08-21 ENCOUNTER — Other Ambulatory Visit: Payer: Self-pay

## 2023-08-23 ENCOUNTER — Other Ambulatory Visit: Payer: Self-pay

## 2023-09-03 ENCOUNTER — Other Ambulatory Visit: Payer: Self-pay

## 2023-09-03 DIAGNOSIS — R42 Dizziness and giddiness: Secondary | ICD-10-CM | POA: Diagnosis not present

## 2023-09-03 DIAGNOSIS — M542 Cervicalgia: Secondary | ICD-10-CM | POA: Diagnosis not present

## 2023-09-03 DIAGNOSIS — M5030 Other cervical disc degeneration, unspecified cervical region: Secondary | ICD-10-CM | POA: Diagnosis not present

## 2023-09-03 DIAGNOSIS — G43909 Migraine, unspecified, not intractable, without status migrainosus: Secondary | ICD-10-CM | POA: Diagnosis not present

## 2023-09-03 MED ORDER — SUMATRIPTAN SUCCINATE 50 MG PO TABS
50.0000 mg | ORAL_TABLET | ORAL | 2 refills | Status: DC
  Filled 2023-09-03: qty 9, 15d supply, fill #0
  Filled 2023-09-23: qty 9, 30d supply, fill #0

## 2023-09-03 MED ORDER — MECLIZINE HCL 25 MG PO TABS
25.0000 mg | ORAL_TABLET | Freq: Three times a day (TID) | ORAL | 1 refills | Status: AC | PRN
  Filled 2023-09-03: qty 30, 10d supply, fill #0

## 2023-09-04 ENCOUNTER — Other Ambulatory Visit (HOSPITAL_COMMUNITY): Payer: Self-pay

## 2023-09-06 DIAGNOSIS — M542 Cervicalgia: Secondary | ICD-10-CM | POA: Diagnosis not present

## 2023-09-06 DIAGNOSIS — R42 Dizziness and giddiness: Secondary | ICD-10-CM | POA: Diagnosis not present

## 2023-09-12 NOTE — Progress Notes (Signed)
Referring Physician:  No referring provider defined for this encounter.  Primary Physician:  Lynwood Dawley, MD  History of Present Illness: 09/16/2023 Robert Fernandez has a history of asthma, CAD, DM, GERD, hyperlipidemia, HTN, osteoporosis, RA. History of CABG.   He saw Dr. Mariah Milling backin 2021 and she discussed C7-T1 IL ESI. Does not appear this was done.   6 month history of constant neck pain with right arm pain into his hand. He has dizziness when he turns his head from side to side. He has numbness and tingling in right hand. Both hands are cold. He has weakness in right hand. He has pain in both shoulders as well.   Help with motrin- but not supposed to take due to NAFLD and cirrhosis.   Bowel/Bladder Dysfunction: some bowel urgency, no incontinence issues.   Conservative measures:  Physical therapy: has not participated in PT  Multimodal medical therapy including regular antiinflammatories: ultram, voltaren, skelaxin  Injections: has not had epidural steroid injections  Past Surgery:  History of lumbar surgery years ago  Robert Fernandez has some balance and dexterity issues. He thinks his balance is getting worse. Being treated for vertigo.   The symptoms are causing a significant impact on the patient's life.   Review of Systems:  A 10 point review of systems is negative, except for the pertinent positives and negatives detailed in the HPI.  Past Medical History: Past Medical History:  Diagnosis Date   Coronary artery disease    Diabetes mellitus without complication (HCC)     Past Surgical History: History reviewed. No pertinent surgical history.  Allergies: Allergies as of 09/16/2023 - Review Complete 09/16/2023  Allergen Reaction Noted   Rosuvastatin Other (See Comments) 10/16/2019   Augmentin [amoxicillin-pot clavulanate]  11/06/2021   Codeine  11/06/2021   Dilaudid [hydromorphone hcl]  11/06/2021    Medications: Outpatient Encounter  Medications as of 09/16/2023  Medication Sig   albuterol (VENTOLIN HFA) 108 (90 Base) MCG/ACT inhaler Inhale 2 puffs into the lungs every 4 (four) hours as needed.   aspirin EC 81 MG tablet Take 81 mg by mouth once.   atropine 1 % ophthalmic solution Place 1 drop into the right eye 2 (two) times daily   Baclofen 5 MG TABS Take 0.5 tablets as needed by oral route at bedtime for 30 days.   brimonidine (ALPHAGAN) 0.2 % ophthalmic solution Place 1 drop into both eyes 2 (two) times daily.   carvedilol (COREG) 6.25 MG tablet Take 1 tablet (6.25 mg total) by mouth 2 (two) times daily with meals   Continuous Blood Gluc Receiver (FREESTYLE LIBRE 14 DAY READER) DEVI Use as directed   Continuous Blood Gluc Sensor (FREESTYLE LIBRE 14 DAY SENSOR) MISC Inject 1 kit subcutaneously every 14 (fourteen) days   Continuous Glucose Sensor (FREESTYLE LIBRE 14 DAY SENSOR) MISC for glucose monitoring   COVID-19 At Home Antigen Test (CARESTART COVID-19 HOME TEST) KIT use as directed   dorzolamide-timolol (COSOPT) 2-0.5 % ophthalmic solution Place 1 drop into the right eye 2 (two) times daily   Dulaglutide (TRULICITY) 1.5 MG/0.5ML SOPN ceived the following from [facility]: Outside name: TRULICITY 1.5 mg/0.5 mL sub-q pen   empagliflozin (JARDIANCE) 25 MG TABS tablet Take 1 tablet (25 mg total) by mouth daily with breakfast.   Evolocumab (REPATHA SURECLICK) 140 MG/ML SOAJ Inject 140 mg under the skin every 14 (fourteen) days.   furosemide (LASIX) 20 MG tablet Take 1 tablet (20 mg total) by  mouth daily.   gabapentin (NEURONTIN) 300 MG capsule Take 1 capsule (300 mg total) by mouth 2 (two) times daily AND 3 capsules (900 mg total) at bedtime.   Insulin Degludec FlexTouch 100 UNIT/ML SOPN Inject 20 Units into the skin at bedtime.   Insulin Pen Needle (UNIFINE PENTIPS PLUS) 31G X 6 MM MISC use as directed   meclizine (ANTIVERT) 25 MG tablet Take 1 tablet (25 mg total) by mouth 3 (three) times daily as needed for  Dizziness   meclizine (ANTIVERT) 25 MG tablet Take 1 tablet (25 mg total) by mouth 3 (three) times daily as needed for dizziness   metaxalone (SKELAXIN) 800 MG tablet Take 1 tablet (800 mg total) by mouth 3 (three) times daily as needed for Pain   methocarbamol (ROBAXIN) 750 MG tablet Take 1 tablet (750 mg total) by mouth 2 (two) times daily as needed.   metoprolol succinate (TOPROL-XL) 25 MG 24 hr tablet Take 1 tablet (25 mg total) by mouth once daily   ofloxacin (OCUFLOX) 0.3 % ophthalmic solution Place 1 drop into the left eye 4 (four) times daily.   omeprazole (PRILOSEC) 40 MG capsule Take 1 capsule (40 mg total) by mouth 2 (two) times daily  before meals.   pioglitazone (ACTOS) 30 MG tablet Take 1 tablet (30 mg total) by mouth daily.   prednisoLONE acetate (PRED FORTE) 1 % ophthalmic suspension Place 1 drop into the left eye 4 (four) times daily.   Semaglutide,0.25 or 0.5MG /DOS, (OZEMPIC, 0.25 OR 0.5 MG/DOSE,) 2 MG/3ML SOPN Inject 0.25 mg into the skin once a week. After 4 weeks, increase to 0.5 mg weekly   spironolactone (ALDACTONE) 50 MG tablet Take 1 tablet (50 mg total) by mouth daily.   SUMAtriptan (IMITREX) 50 MG tablet Take 1 tablet (50 mg total) by mouth as directed for Migraine May take a second dose after 2 hours if needed.   trimethoprim-polymyxin b (POLYTRIM) ophthalmic solution Place 1 drop into the right eye 4 (four) times daily   verapamil (VERELAN) 360 MG 24 hr capsule Take 1 capsule (360 mg total) by mouth at bedtime.   losartan (COZAAR) 25 MG tablet TAKE 1 TABLET (25 MG TOTAL) BY MOUTH ONCE DAILY   [DISCONTINUED] Baclofen 5 MG TABS Take 1 tablet (5 mg total) by mouth 3 (three) times daily.   [DISCONTINUED] benzonatate (TESSALON) 200 MG capsule Take 1 capsule (200 mg total) by mouth 3 (three) times daily as needed for cough   [DISCONTINUED] brimonidine (ALPHAGAN) 0.2 % ophthalmic solution Place 1 drop into the left eye 2 (two) times daily.   [DISCONTINUED] carvedilol (COREG)  3.125 MG tablet Take 1 tablet (3.125 mg total) by mouth 2 (two) times daily with meals   [DISCONTINUED] carvedilol (COREG) 6.25 MG tablet Take 1 tablet (6.25 mg total) by mouth 2 (two) times daily with a meal.   [DISCONTINUED] cyclobenzaprine (FLEXERIL) 10 MG tablet Take 1 tablet (10 mg total) by mouth 3 (three) times daily as needed for muscle spasms. (Patient not taking: Reported on 09/16/2023)   [DISCONTINUED] diclofenac (VOLTAREN) 75 MG EC tablet TAKE 1 TABLET BY MOUTH ONCE DAILY   [DISCONTINUED] diclofenac (VOLTAREN) 75 MG EC tablet Take 1 tablet (75 mg total) by mouth once daily   [DISCONTINUED] doxycycline (VIBRAMYCIN) 100 MG capsule Take 1 capsule (100 mg total) by mouth 2 (two) times daily for 7 days   [DISCONTINUED] erythromycin ophthalmic ointment Place 1 Application into the right eye 3 (three) times daily.   [DISCONTINUED] Evolocumab (REPATHA SURECLICK)  140 MG/ML SOAJ INJECT 140MG  UNDER THE SKIN EVERY 14 DAYS   [DISCONTINUED] famciclovir (FAMVIR) 500 MG tablet Take 1 tablet (500 mg total) by mouth 3 (three) times daily.   [DISCONTINUED] fluticasone (FLONASE) 50 MCG/ACT nasal spray PLACE 2 SPRAYS INTO BOTH NOSTRILS ONCE DAILY   [DISCONTINUED] gabapentin (NEURONTIN) 300 MG capsule TAKE 1 CAPSULE BY MOUTH 3 TIMES A DAY AND 2 CAPSULES AT BEDTIME   [DISCONTINUED] gabapentin (NEURONTIN) 300 MG capsule TAKE 2 TO 3 CAPSULES BY MOUTH DAILY AT BEDTIME   [DISCONTINUED] gabapentin (NEURONTIN) 300 MG capsule Take 1 capsule twice a day and 3 capsules at bedtime   [DISCONTINUED] LORazepam (ATIVAN) 1 MG tablet Take 1 tablet (1 mg total) by mouth.   [DISCONTINUED] metFORMIN (GLUCOPHAGE-XR) 500 MG 24 hr tablet TAKE 2 TABLETS (1,000 MG) BY MOUTH 2 TIMES DAILY WITH MEALS   [DISCONTINUED] methocarbamol (ROBAXIN) 500 MG tablet Take 1 tablet (500 mg total) by mouth 2 (two) times daily as needed   [DISCONTINUED] methocarbamol (ROBAXIN) 750 MG tablet Take 1 tablet (750 mg total) by mouth 2 (two) times daily as  needed   [DISCONTINUED] methylPREDNISolone (MEDROL DOSEPAK) 4 MG TBPK tablet Take 6 pills on day one then decrease by 1 pill each day   [DISCONTINUED] metoprolol succinate (TOPROL-XL) 25 MG 24 hr tablet TAKE 1 TABLET (25 MG TOTAL) BY MOUTH ONCE DAILY   [DISCONTINUED] mupirocin ointment (BACTROBAN) 2 % Apply topically 3 (three) times daily for 7 days   [DISCONTINUED] omeprazole (PRILOSEC) 40 MG capsule TAKE 1 CAPSULE BY MOUTH ONCE DAILY   [DISCONTINUED] omeprazole (PRILOSEC) 40 MG capsule TAKE 1 CAPSULE BY MOUTH ONCE DAILY   [DISCONTINUED] omeprazole (PRILOSEC) 40 MG capsule Take 1 capsule (40 mg total) by mouth 2 (two) times daily before meals   [DISCONTINUED] oxyCODONE (OXY IR/ROXICODONE) 5 MG immediate release tablet Take 1 tablet (5 mg total) by mouth every 4 (four) hours as needed.   [DISCONTINUED] pioglitazone (ACTOS) 15 MG tablet Take 1 tablet (15 mg total) by mouth daily.   [DISCONTINUED] prednisoLONE acetate (PRED FORTE) 1 % ophthalmic suspension Place 1 drop into the right eye 4 (four) times daily for 30 days   [DISCONTINUED] prednisoLONE acetate (PRED FORTE) 1 % ophthalmic suspension Place 1 drop into the right eye 4 (four) times daily   [DISCONTINUED] predniSONE (DELTASONE) 10 MG tablet Take 1 tablet (10 mg total) by mouth 2 (two) times daily.   [DISCONTINUED] Semaglutide, 2 MG/DOSE, (OZEMPIC, 2 MG/DOSE,) 8 MG/3ML SOPN Inject 0.75 mLs (2 mg total) subcutaneously once a week   [DISCONTINUED] Semaglutide, 2 MG/DOSE, (OZEMPIC, 2 MG/DOSE,) 8 MG/3ML SOPN Inject 0.75 mLs (2 mg total) subcutaneously once a week   [DISCONTINUED] sulfamethoxazole-trimethoprim (BACTRIM DS) 800-160 MG tablet Take 1 tablet by mouth 2 (two) times daily for 7 days.   [DISCONTINUED] SUMAtriptan (IMITREX) 50 MG tablet Take 1 tablet (50 mg total) by mouth as directed for Migraine May take a second dose after 2 hours if needed.   [DISCONTINUED] tiZANidine (ZANAFLEX) 4 MG tablet Take 1 tablet (4 mg total) by mouth every 8  (eight) hours as needed   [DISCONTINUED] tiZANidine (ZANAFLEX) 4 MG tablet Take 1 tablet (4 mg total) by mouth every 8 (eight) hours as needed   [DISCONTINUED] traMADol (ULTRAM) 50 MG tablet Take 1 tablet (50 mg total) by mouth every 8 (eight) hours as needed for pain.   [DISCONTINUED] valACYclovir (VALTREX) 1000 MG tablet Take 1 tablet (1,000 mg total) by mouth 3 (three) times daily for 10 days   [  DISCONTINUED] verapamil (CALAN-SR) 240 MG CR tablet TAKE 1 TABLET (240 MG TOTAL) BY MOUTH ONCE DAILY   [DISCONTINUED] verapamil (CALAN-SR) 240 MG CR tablet Take 1 tablet (240 mg total) by mouth once daily   [DISCONTINUED] verapamil (VERELAN PM) 360 MG 24 hr capsule Take 1 capsule (360 mg total) by mouth nightly   No facility-administered encounter medications on file as of 09/16/2023.    Social History: Social History   Tobacco Use   Smoking status: Never   Smokeless tobacco: Never  Substance Use Topics   Alcohol use: Never   Drug use: Not Currently    Family Medical History: History reviewed. No pertinent family history.  Physical Examination: Vitals:   09/16/23 0959  BP: 108/60    General: Patient is well developed, well nourished, calm, collected, and in no apparent distress. Attention to examination is appropriate.  Respiratory: Patient is breathing without any difficulty.   NEUROLOGICAL:     Awake, alert, oriented to person, place, and time.  Speech is clear and fluent. Fund of knowledge is appropriate.   Cranial Nerves: Pupils equal round and reactive to light.  Facial tone is symmetric.    Mild lower posterior cervical tenderness. Mild tenderness in right trapezial region.   He has limited ROM of both shoulders with pain.   No abnormal lesions on exposed skin.   Strength: Side Biceps Triceps Deltoid Interossei Grip Wrist Ext. Wrist Flex.  R 5 5 5 5 5 5 5   L 5 5 5 5 5 5 5    Side Iliopsoas Quads Hamstring PF DF EHL  R 5 5 5 5 5 5   L 5 5 5 5 5 5    He is missing  half of his ring finger on his left hand.   Reflexes are 2+ and symmetric at the biceps, brachioradialis, patella and achilles.   Hoffman's is absent.  Clonus is not present.   Bilateral upper and lower extremity sensation is intact to light touch.     Gait is slow.   Medical Decision Making  Imaging: MRI cervical spine dated 11/09/20:  FINDINGS: Alignment: Normal   Vertebrae: Normal bone marrow.  Negative for fracture or mass.   Cord: Normal signal and morphology   Posterior Fossa, vertebral arteries, paraspinal tissues: Negative   Disc levels:   C2-3: Negative   C3-4: Mild disc degeneration and mild uncinate spurring. No significant stenosis.   C4-5: Mild disc degeneration. Moderate facet degeneration right greater than left. No significant spinal or foraminal stenosis.   C5-6: Mild disc degeneration. Bilateral facet degeneration. No significant spinal or foraminal stenosis.   C6-7: Small central disc protrusion touching the cord without significant spinal stenosis. Neural foramina patent bilaterally.   C7-T1: Negative   IMPRESSION: Mild degenerative changes cervical spine. Mild disc and facet degeneration at multiple levels without significant neural impingement     Electronically Signed   By: Marlan Palau M.D.   On: 11/10/2020 10:05  I have personally reviewed the images and agree with the above interpretation.  Assessment and Plan: Robert Fernandez is a pleasant 67 y.o. male who has a 6 month history of constant neck pain with right arm pain into his hand. He has numbness and tingling in right hand. Both hands are cold. He has weakness in right hand.   Imaging from 2021 showed multilevel cervical spondylosis and DDD.   Shoulder pain is likely from bilateral shoulders- he has limited/painful ROM.   He has some balance and dexterity issues. He is  being treated for vertigo. No signs of cervical myelopathy on exam.   Treatment options discussed with patient  and following plan made:   - MRI of cervical spine to further evaluate cervical radiculopathy. Requests WIDE BORE. He will need valium as well- he will let me know when MRI is scheduled.  - Cervical xrays to be done when he gets MRI.  - Discussed dizziness is not likely cervical mediated.  - Will schedule phone visit to review MRI results once I get them back.  - Depending on results, may consider PT and/or injections with pain management.   I spent a total of 30 minutes in face-to-face and non-face-to-face activities related to this patient's care today including review of outside records, review of imaging, review of symptoms, physical exam, discussion of differential diagnosis, discussion of treatment options, and documentation.   Thank you for involving me in the care of this patient.   Drake Leach PA-C Dept. of Neurosurgery

## 2023-09-16 ENCOUNTER — Ambulatory Visit (INDEPENDENT_AMBULATORY_CARE_PROVIDER_SITE_OTHER): Payer: Commercial Managed Care - PPO | Admitting: Orthopedic Surgery

## 2023-09-16 ENCOUNTER — Encounter: Payer: Self-pay | Admitting: Orthopedic Surgery

## 2023-09-16 VITALS — BP 108/60 | Ht 72.0 in | Wt 217.0 lb

## 2023-09-16 DIAGNOSIS — M25511 Pain in right shoulder: Secondary | ICD-10-CM

## 2023-09-16 DIAGNOSIS — M5030 Other cervical disc degeneration, unspecified cervical region: Secondary | ICD-10-CM

## 2023-09-16 DIAGNOSIS — M25512 Pain in left shoulder: Secondary | ICD-10-CM

## 2023-09-16 DIAGNOSIS — M50223 Other cervical disc displacement at C6-C7 level: Secondary | ICD-10-CM | POA: Diagnosis not present

## 2023-09-16 DIAGNOSIS — M50322 Other cervical disc degeneration at C5-C6 level: Secondary | ICD-10-CM | POA: Diagnosis not present

## 2023-09-16 DIAGNOSIS — M50321 Other cervical disc degeneration at C4-C5 level: Secondary | ICD-10-CM

## 2023-09-16 DIAGNOSIS — M5412 Radiculopathy, cervical region: Secondary | ICD-10-CM

## 2023-09-16 DIAGNOSIS — M4722 Other spondylosis with radiculopathy, cervical region: Secondary | ICD-10-CM

## 2023-09-16 DIAGNOSIS — M47812 Spondylosis without myelopathy or radiculopathy, cervical region: Secondary | ICD-10-CM

## 2023-09-16 NOTE — Patient Instructions (Addendum)
It was so nice to see you today. Thank you so much for coming in.    I want to get an MRI of your neck to look into things further. We will get this approved through your insurance and [facility] will call you to schedule the appointment. Make sure it's the WIDE BORE MRI.   I want to get some xrays of your neck as well.   Once you have the MRI scheduled, let me know so I can call in valium for you to take prior. You will need a driver.   After you have the MRI, it takes 10-14 days for me to get the results back. Once I have them, we will call you to schedule a follow up phone visit with me to review them.   Please do not hesitate to call if you have any questions or concerns. You can also message me in MyChart.   If you have not heard back about any of the above things in the next week, please call the office so we can help you get them scheduled.   Drake Leach PA-C [phone]

## 2023-09-18 ENCOUNTER — Other Ambulatory Visit: Payer: Self-pay

## 2023-09-19 ENCOUNTER — Other Ambulatory Visit: Payer: Self-pay

## 2023-09-19 MED ORDER — DORZOLAMIDE HCL-TIMOLOL MAL 2-0.5 % OP SOLN
1.0000 [drp] | Freq: Two times a day (BID) | OPHTHALMIC | 11 refills | Status: DC
Start: 2023-09-19 — End: 2023-11-29
  Filled 2023-09-19: qty 10, 90d supply, fill #0
  Filled 2023-11-18: qty 10, 90d supply, fill #1

## 2023-09-20 ENCOUNTER — Other Ambulatory Visit: Payer: Self-pay

## 2023-09-20 ENCOUNTER — Telehealth: Payer: Self-pay | Admitting: Orthopedic Surgery

## 2023-09-20 DIAGNOSIS — M5030 Other cervical disc degeneration, unspecified cervical region: Secondary | ICD-10-CM

## 2023-09-20 DIAGNOSIS — M47812 Spondylosis without myelopathy or radiculopathy, cervical region: Secondary | ICD-10-CM

## 2023-09-20 DIAGNOSIS — M5412 Radiculopathy, cervical region: Secondary | ICD-10-CM

## 2023-09-20 MED ORDER — DIAZEPAM 5 MG PO TABS
5.0000 mg | ORAL_TABLET | ORAL | 0 refills | Status: DC
Start: 2023-09-20 — End: 2023-12-12
  Filled 2023-09-20: qty 2, 1d supply, fill #0

## 2023-09-20 NOTE — Telephone Encounter (Signed)
Patient made his appointment to complete his MRI exam- needing claustrophobia medication sent in to [facility].  Please advise

## 2023-09-20 NOTE — Telephone Encounter (Signed)
Valium sent to pharmacy for his MRI. Please remind him that he will need a driver.

## 2023-09-23 ENCOUNTER — Other Ambulatory Visit: Payer: Self-pay

## 2023-09-23 DIAGNOSIS — R42 Dizziness and giddiness: Secondary | ICD-10-CM | POA: Diagnosis not present

## 2023-09-24 ENCOUNTER — Ambulatory Visit
Admission: RE | Admit: 2023-09-24 | Discharge: 2023-09-24 | Disposition: A | Discharge status: Home / Self Care | Payer: Commercial Managed Care - PPO | Source: Ambulatory Visit | Attending: Orthopedic Surgery | Admitting: Orthopedic Surgery

## 2023-09-24 ENCOUNTER — Other Ambulatory Visit: Payer: Self-pay

## 2023-09-24 DIAGNOSIS — M5030 Other cervical disc degeneration, unspecified cervical region: Secondary | ICD-10-CM | POA: Insufficient documentation

## 2023-09-24 DIAGNOSIS — M4802 Spinal stenosis, cervical region: Secondary | ICD-10-CM | POA: Diagnosis not present

## 2023-09-24 DIAGNOSIS — M47812 Spondylosis without myelopathy or radiculopathy, cervical region: Secondary | ICD-10-CM | POA: Insufficient documentation

## 2023-09-24 DIAGNOSIS — M2578 Osteophyte, vertebrae: Secondary | ICD-10-CM | POA: Diagnosis not present

## 2023-09-24 DIAGNOSIS — Z794 Long term (current) use of insulin: Secondary | ICD-10-CM | POA: Diagnosis not present

## 2023-09-24 DIAGNOSIS — M47813 Spondylosis without myelopathy or radiculopathy, cervicothoracic region: Secondary | ICD-10-CM | POA: Diagnosis not present

## 2023-09-24 DIAGNOSIS — M542 Cervicalgia: Secondary | ICD-10-CM | POA: Diagnosis not present

## 2023-09-24 DIAGNOSIS — M47811 Spondylosis without myelopathy or radiculopathy, occipito-atlanto-axial region: Secondary | ICD-10-CM | POA: Diagnosis not present

## 2023-09-24 DIAGNOSIS — E1141 Type 2 diabetes mellitus with diabetic mononeuropathy: Secondary | ICD-10-CM | POA: Diagnosis not present

## 2023-09-24 DIAGNOSIS — M5412 Radiculopathy, cervical region: Secondary | ICD-10-CM | POA: Insufficient documentation

## 2023-09-24 MED ORDER — OZEMPIC (1 MG/DOSE) 4 MG/3ML ~~LOC~~ SOPN
1.0000 mg | PEN_INJECTOR | SUBCUTANEOUS | 11 refills | Status: DC
  Filled 2023-09-24: qty 3, 28d supply, fill #0

## 2023-09-26 ENCOUNTER — Other Ambulatory Visit (HOSPITAL_COMMUNITY): Payer: Self-pay

## 2023-10-01 ENCOUNTER — Other Ambulatory Visit: Payer: Self-pay

## 2023-10-03 ENCOUNTER — Other Ambulatory Visit: Payer: Self-pay

## 2023-10-03 MED ORDER — GABAPENTIN 300 MG PO CAPS
ORAL_CAPSULE | ORAL | 3 refills | Status: AC
  Filled 2024-01-08 (×2): qty 450, 90d supply, fill #0
  Filled 2024-04-16: qty 450, 90d supply, fill #1
  Filled 2024-07-16: qty 450, 90d supply, fill #2

## 2023-10-04 DIAGNOSIS — Z794 Long term (current) use of insulin: Secondary | ICD-10-CM | POA: Diagnosis not present

## 2023-10-04 DIAGNOSIS — N644 Mastodynia: Secondary | ICD-10-CM | POA: Diagnosis not present

## 2023-10-04 DIAGNOSIS — Z23 Encounter for immunization: Secondary | ICD-10-CM | POA: Diagnosis not present

## 2023-10-04 DIAGNOSIS — E1141 Type 2 diabetes mellitus with diabetic mononeuropathy: Secondary | ICD-10-CM | POA: Diagnosis not present

## 2023-10-04 DIAGNOSIS — Z87891 Personal history of nicotine dependence: Secondary | ICD-10-CM | POA: Diagnosis not present

## 2023-10-07 ENCOUNTER — Other Ambulatory Visit: Payer: Self-pay

## 2023-10-08 ENCOUNTER — Other Ambulatory Visit: Payer: Self-pay | Admitting: Student

## 2023-10-08 DIAGNOSIS — N644 Mastodynia: Secondary | ICD-10-CM

## 2023-10-10 ENCOUNTER — Other Ambulatory Visit: Payer: Self-pay

## 2023-10-14 ENCOUNTER — Ambulatory Visit
Admission: RE | Admit: 2023-10-14 | Discharge: 2023-10-14 | Disposition: A | Discharge status: Home / Self Care | Payer: Commercial Managed Care - PPO | Source: Ambulatory Visit | Attending: Student | Admitting: Student

## 2023-10-14 DIAGNOSIS — N62 Hypertrophy of breast: Secondary | ICD-10-CM | POA: Diagnosis not present

## 2023-10-14 DIAGNOSIS — R92323 Mammographic fibroglandular density, bilateral breasts: Secondary | ICD-10-CM | POA: Diagnosis not present

## 2023-10-14 DIAGNOSIS — N644 Mastodynia: Secondary | ICD-10-CM

## 2023-10-16 ENCOUNTER — Telehealth: Payer: Self-pay

## 2023-10-16 NOTE — Telephone Encounter (Signed)
The patient and his wife called in wanting to switch his care to our practice from [facility]. I would like to provide some context regarding a patient currently seen at the [facility] within the main hospital. The patient has been under the care of rotating medical students, which has contributed to some challenges in continuity of care. Notably, the patient has experienced difficulties in obtaining prescription refills and has been seen by different providers every six months.  The patient's wife, who is employed by [employer], has indicated that her husband is covered under her insurance. This may be a contributing factor in their decision to continue care at our facility.  The patient has a diagnosis of fatty liver disease and cirrhosis, and I believe a more consistent provider approach could significantly enhance his management and treatment outcomes.  Thank you for your attention to this matter. Please let me know if you need any further information.

## 2023-10-20 NOTE — Progress Notes (Unsigned)
Telephone Visit- Progress Note: Referring Physician:  Lynwood Dawley, MD [address] [place],  [place] [address]  Primary Physician:  Robert Dawley, MD  This visit was performed via telephone.  Patient location: home Provider location: office  I spent a total of 12 minutes non-face-to-face activities for this visit on the date of this encounter including review of current clinical condition and response to treatment.    Patient has given verbal consent to this telephone visits and we reviewed the limitations of a telephone visit. Patient wishes to proceed.    Chief Complaint:  f/u MRI  History of Present Illness: Robert Fernandez is a 67 y.o. male has a history of asthma, CAD, DM, GERD, hyperlipidemia, HTN, osteoporosis, RA. History of CABG.    Last saw me on 09/16/23 for neck and right arm pain. Imaging from 2021 showed multilevel cervical spondylosis and DDD.    Shoulder pain is likely from bilateral shoulders- he has limited/painful ROM.    He has some balance and dexterity issues. He is being treated for vertigo. No signs of cervical myelopathy on exam.   Phone visit scheduled to review his cervical MRI scan and cervical xrays.    No change in symptoms, except his dizziness is better. He continues with constant neck pain with right arm pain into his hand. He has numbness and tingling in right hand with weakness. He has pain in both shoulders as well, right > left.    Bowel/Bladder Dysfunction: some bowel urgency, no incontinence issues.    Conservative measures:  Physical therapy: has not participated in PT  Multimodal medical therapy including regular antiinflammatories: ultram, voltaren, skelaxin  Injections: has not had epidural steroid injections   Past Surgery:  History of lumbar surgery years ago   Robert Fernandez has some balance and dexterity issues. He thinks his balance is getting worse. Being treated for vertigo.    The  symptoms are causing a significant impact on the patient's life.    Exam: No exam done as this was a telephone encounter.     Imaging: Cervical xrays dated 09/24/23:  FINDINGS: The cervical spine is visualized from C1-the superior endplate of C7. Cervical alignment is maintained. No evidence of dynamic instability on limited excursion flexion and extension views. Vertebral body heights are maintained: no evidence of acute fracture. Relative preservation of the disc spaces with flowing anterior osteophytes. No prevertebral soft tissue swelling. LEFT-sided carotid calcifications.   IMPRESSION: 1. No acute fracture or traumatic listhesis. 2. Flowing anterior osteophytes which can be seen in the setting of diffuse idiopathic skeletal hyperostosis.     Electronically Signed   By: Meda Klinefelter M.D.   On: 10/12/2023 14:14    Cervical MRI dated 09/24/23:  FINDINGS: Alignment: Physiologic.   Vertebrae: No fracture, evidence of discitis, or bone lesion.   Cord: Normal signal and morphology.   Posterior Fossa, vertebral arteries, paraspinal tissues: Negative.   Disc levels:   Limitations: Motion degraded exam   C1-C2: Mild degenerative change.   C2-C3: Mild bilateral facet degenerative change. No spinal canal or neural foraminal narrowing.   C3-C4: Mild bilateral facet degenerative change. Uncovertebral hypertrophy. Mild right neural foraminal narrowing. No spinal canal narrowing.   C4-C5: Mild bilateral facet degenerative change. Uncovertebral hypertrophy. Mild bilateral neural foraminal narrowing. No spinal canal narrowing.   C5-C6: Mild bilateral facet degenerative change. Uncovertebral hypertrophy. Mild bilateral neural foraminal narrowing. No spinal canal narrowing.   C6-C7: Circumferential disc bulge. Mild  spinal canal narrowing. Uncovertebral hypertrophy. Mild bilateral neural foraminal narrowing, right-greater-than-left.   C7-T1: Mild bilateral facet  degenerative change. No spinal canal or neural foraminal narrowing.   IMPRESSION: The degree of motion artifact limits accurate assessment of the degree of spinal canal or neural foraminal stenosis.   1. Mild multilevel degenerative changes without evidence of high-grade spinal canal narrowing.   2. There is mild neural foraminal narrowing at C3-C4 (right) and C4-C7 (bilateral).     Electronically Signed   By: Lorenza Cambridge M.D.   On: 10/10/2023 12:55    I have personally reviewed the images and agree with the above interpretation.  Assessment and Plan: [name] is a pleasant 67 y.o. male who has a 6 month history of constant neck pain with right arm pain into his hand. He has numbness and tingling in right hand with weakness. He has pain in both shoulders as well, right > left.   He has known cervical spondylosis with anterior osteophytes along with multilevel foraminal stenosis. He has disc at C6-C7 with mild central stenosis and mild bilateral foraminal stenosis.   Cervical xray also shows left carotid calcifications.    Shoulder pain is likely from bilateral shoulders- he had limited/painful ROM at his last visit.    Treatment options discussed with patient and following plan made:    - Referral to pain management (Robert Fernandez) to discuss cervical injections.  - Recommend PT for cervical spine and shoulders. He declines.  - Referral to ortho Robert Fernandez) for bilateral shoulder pain.  - Message to PCP regarding left carotid calcifications seen on xray. Discussed with patient as well.  - Follow up with me in 6-8 weeks.   Robert Leach PA-C Neurosurgery

## 2023-10-21 NOTE — Telephone Encounter (Signed)
I called the patient to let them know the final say for the transfer care. The patient and his wife stated that it doesn't make any sense they would rather speak to Dr. Tobi Bastos directly. The patient wife said that the insurance is not covered with [facility] because he is on her insurance, and she work for [employer]. I informed them I can only read what's in my notes because they had some clinical questions that I am unable to answer.

## 2023-10-22 ENCOUNTER — Ambulatory Visit (INDEPENDENT_AMBULATORY_CARE_PROVIDER_SITE_OTHER): Payer: Commercial Managed Care - PPO | Admitting: Orthopedic Surgery

## 2023-10-22 ENCOUNTER — Encounter: Payer: Self-pay | Admitting: Orthopedic Surgery

## 2023-10-22 DIAGNOSIS — M4802 Spinal stenosis, cervical region: Secondary | ICD-10-CM

## 2023-10-22 DIAGNOSIS — M4722 Other spondylosis with radiculopathy, cervical region: Secondary | ICD-10-CM | POA: Diagnosis not present

## 2023-10-22 DIAGNOSIS — M25511 Pain in right shoulder: Secondary | ICD-10-CM | POA: Diagnosis not present

## 2023-10-22 DIAGNOSIS — M5030 Other cervical disc degeneration, unspecified cervical region: Secondary | ICD-10-CM

## 2023-10-22 DIAGNOSIS — M50323 Other cervical disc degeneration at C6-C7 level: Secondary | ICD-10-CM

## 2023-10-22 DIAGNOSIS — M47812 Spondylosis without myelopathy or radiculopathy, cervical region: Secondary | ICD-10-CM

## 2023-10-22 DIAGNOSIS — M25512 Pain in left shoulder: Secondary | ICD-10-CM

## 2023-10-22 DIAGNOSIS — M5412 Radiculopathy, cervical region: Secondary | ICD-10-CM

## 2023-10-23 ENCOUNTER — Other Ambulatory Visit: Payer: Self-pay

## 2023-10-23 ENCOUNTER — Other Ambulatory Visit (HOSPITAL_COMMUNITY): Payer: Self-pay

## 2023-11-04 DIAGNOSIS — H35373 Puckering of macula, bilateral: Secondary | ICD-10-CM | POA: Diagnosis not present

## 2023-11-04 DIAGNOSIS — E113513 Type 2 diabetes mellitus with proliferative diabetic retinopathy with macular edema, bilateral: Secondary | ICD-10-CM | POA: Diagnosis not present

## 2023-11-08 DIAGNOSIS — M19011 Primary osteoarthritis, right shoulder: Secondary | ICD-10-CM | POA: Diagnosis not present

## 2023-11-11 DIAGNOSIS — E113513 Type 2 diabetes mellitus with proliferative diabetic retinopathy with macular edema, bilateral: Secondary | ICD-10-CM | POA: Diagnosis not present

## 2023-11-12 ENCOUNTER — Other Ambulatory Visit: Payer: Self-pay | Admitting: Orthopedic Surgery

## 2023-11-12 ENCOUNTER — Other Ambulatory Visit: Payer: Self-pay

## 2023-11-12 DIAGNOSIS — M19011 Primary osteoarthritis, right shoulder: Secondary | ICD-10-CM

## 2023-11-13 ENCOUNTER — Other Ambulatory Visit: Payer: Self-pay

## 2023-11-13 MED ORDER — FUROSEMIDE 20 MG PO TABS
20.0000 mg | ORAL_TABLET | Freq: Every day | ORAL | 3 refills | Status: AC
Start: 2023-11-13 — End: ?
  Filled 2023-11-13: qty 90, 90d supply, fill #0
  Filled 2024-02-26: qty 90, 90d supply, fill #1
  Filled 2024-06-01: qty 90, 90d supply, fill #2
  Filled 2024-09-09: qty 90, 90d supply, fill #3

## 2023-11-14 ENCOUNTER — Encounter: Payer: Self-pay | Admitting: Student in an Organized Health Care Education/Training Program

## 2023-11-14 ENCOUNTER — Ambulatory Visit
Payer: Commercial Managed Care - PPO | Attending: Student in an Organized Health Care Education/Training Program | Admitting: Student in an Organized Health Care Education/Training Program

## 2023-11-14 VITALS — BP 109/68 | HR 71 | Temp 97.2°F | Ht 72.0 in | Wt 218.0 lb

## 2023-11-14 DIAGNOSIS — G8929 Other chronic pain: Secondary | ICD-10-CM

## 2023-11-14 DIAGNOSIS — M25511 Pain in right shoulder: Secondary | ICD-10-CM | POA: Diagnosis not present

## 2023-11-14 DIAGNOSIS — M5412 Radiculopathy, cervical region: Secondary | ICD-10-CM | POA: Diagnosis not present

## 2023-11-14 DIAGNOSIS — M19011 Primary osteoarthritis, right shoulder: Secondary | ICD-10-CM | POA: Diagnosis not present

## 2023-11-14 NOTE — Progress Notes (Signed)
Patient: Robert Fernandez  Service Category: E/M  Provider: Edward Jolly, MD  DOB: [birth year]  DOS: 11/14/2023  Referring Provider: Gardiner Rhyme  MRN: [id number]  Setting: Ambulatory outpatient  PCP: Lynwood Dawley, MD  Type: New Patient  Specialty: Interventional Pain Management    Location: Office  Delivery: Face-to-face     Primary Reason(s) for Visit: Encounter for initial evaluation of one or more chronic problems (new to examiner) potentially causing chronic pain, and posing a threat to normal musculoskeletal function. (Level of risk: High) CC: Shoulder Pain (right)  HPI  Robert Fernandez is a 67 y.o. year old, male patient, who comes for the first time to our practice referred by Drake Leach, PA-C for our initial evaluation of his chronic pain. 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; Diabetic retinopathy of both eyes (HCC); Dysfunction of Eustachian tube, bilateral; Erectile dysfunction; Facial droop; Hepatosplenomegaly; History of deep venous thrombosis; HLD (hyperlipidemia); HTN (hypertension); Hx of CABG; Left adrenal mass (HCC); Meningioma (HCC); Migraines; Myofascial pain; Neck pain; Neovascular glaucoma, indeterminate stage, left; Nocturnal leg cramps; Obstructive sleep apnea; Osteoarthritis; Osteopenia; Overweight (BMI 25.0-29.9); Pain of left calf; Pain of left lower leg; Plantar fasciitis; Preoperative evaluation of a medical condition to rule out surgical contraindications (TAR required); Raised intraocular pressure of right eye; Slurred speech; SOBOE (shortness of breath on exertion); Strain of calf muscle; 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 Today he comes in for evaluation of his Shoulder Pain (right)  Pain Assessment: Location: Right (neck) Shoulder Radiating: denies Onset: More than a month ago Duration: Chronic pain Quality: Aching, Burning, Constant, Stabbing Severity: 8 /10 (subjective, self-reported pain score)  Effect on ADL: limits ADLs Timing: Constant Modifying factors: denies BP: 109/68  HR: 71  Onset and Duration: Gradual and Present longer than 3 months Cause of pain: Unknown Severity: Getting worse, NAS-11 at its worse: 10/10, NAS-11 at its best: 5/10, NAS-11 now: 7/10, and NAS-11 on the average: 7/10 Timing: Not influenced by the time of the day Aggravating Factors: Motion Alleviating Factors:  pt states nothing Associated Problems: Night-time cramps, Numbness, Spasms, Tingling, and Pain that wakes patient up Quality of Pain: Constant, Distressing, Splitting, and Stabbing Previous Examinations or Tests: MRI scan, X-rays, and Orthopedic evaluation Previous Treatments: The patient denies treatment  Robert Fernandez is being evaluated for possible interventional pain management therapies for the treatment of his chronic pain.   History of Present Illness   The patient, Robert Fernandez, presents with a chief complaint of severe right shoulder pain and neck pain. The right shoulder pain has been ongoing for over a year and has progressively worsened to the point where the patient reports difficulty in performing basic tasks such as lifting a cup of coffee. The patient describes the pain as severe, particularly when lying on the affected shoulder. The patient has not had any prior surgery on the right shoulder, but has had a rotator cuff repair on the left shoulder approximately 10-12 years ago.  In addition to the shoulder pain, the patient also reports neck pain that radiates into the arms. The patient describes a sensation of his neck 'moving from side to side' when pressure is applied. The  patient has a known  history of bone spurs in the neck. The patient's range of motion in the neck is limited, with pain reported on extension and flexion.  The patient has a history of open heart surgery and is currently managing diabetes, with a goal to reduce his A1c levels prior to the upcoming shoulder surgery. The patient also reports neuropathy in the feet, with symptoms of burning and tingling. The patient is currently on gabapentin for this condition.  The patient also mentions a history of liver and spleen problems, which have led to restrictions on certain medications. The patient also receives regular injections in the eyes from an ophthalmologist for an unspecified condition.      Meds   Current Outpatient Medications:    albuterol (VENTOLIN HFA) 108 (90 Base) MCG/ACT inhaler, Inhale 2 puffs into the lungs every 4 (four) hours as needed., Disp: 6.7 g, Rfl: 0   aspirin EC 81 MG tablet, Take 81 mg by mouth once., Disp: , Rfl:    atropine 1 % ophthalmic solution, Place 1 drop into the right eye 2 (two) times daily, Disp: 5 mL, Rfl: 0   brimonidine (ALPHAGAN) 0.2 % ophthalmic solution, Place 1 drop into both eyes 2 (two) times daily., Disp: 10 mL, Rfl: 11   carvedilol (COREG) 6.25 MG tablet, Take 1 tablet (6.25 mg total) by mouth 2 (two) times daily with meals, Disp: 60 tablet, Rfl: 11   Continuous Blood Gluc Receiver (FREESTYLE LIBRE 14 DAY READER) DEVI, Use as directed, Disp: 1 each, Rfl: 0   Continuous Blood Gluc Sensor (FREESTYLE LIBRE 14 DAY SENSOR) MISC, Inject 1 kit subcutaneously every 14 (fourteen) days, Disp: 2 each, Rfl: 11   Continuous Glucose Sensor (FREESTYLE LIBRE 14 DAY SENSOR) MISC, for glucose monitoring, Disp: 7 each, Rfl: 3   dorzolamide-timolol (COSOPT) 2-0.5 % ophthalmic solution, Place 1 drop into the right eye 2 (two) times daily, Disp: 10 mL, Rfl: 6   Dulaglutide (TRULICITY) 1.5 MG/0.5ML SOPN, ceived the following from [facility]: Outside name:  TRULICITY 1.5 mg/0.5 mL sub-q pen, Disp: , Rfl:    empagliflozin (JARDIANCE) 25 MG TABS tablet, Take 1 tablet (25 mg total) by mouth daily with breakfast., Disp: 90 tablet, Rfl: 3   Evolocumab (REPATHA SURECLICK) 140 MG/ML SOAJ, Inject 140 mg under the skin every 14 (fourteen) days., Disp: 2 mL, Rfl: 0   furosemide (LASIX) 20 MG tablet, Take 1 tablet (20 mg total) by mouth daily., Disp: 100 tablet, Rfl: 3   gabapentin (NEURONTIN) 300 MG capsule, Take 1 capsule (300 mg total) by mouth 2 (two) times daily AND 3 capsules (900 mg total) at bedtime., Disp: 450 capsule, Rfl: 3   Insulin Degludec FlexTouch 100 UNIT/ML SOPN, Inject 20 Units into the skin at bedtime., Disp: 15 mL, Rfl: 12   Insulin Pen Needle (UNIFINE PENTIPS PLUS) 31G X 6 MM MISC, use as directed, Disp: 100 each, Rfl: 12   losartan (COZAAR) 25 MG tablet, TAKE 1 TABLET (25 MG TOTAL) BY MOUTH ONCE DAILY, Disp: 90 tablet, Rfl: 3   omeprazole (PRILOSEC) 40 MG capsule, Take 1 capsule (40 mg total) by mouth 2 (two) times daily  before meals., Disp: 180 capsule, Rfl: 3   pioglitazone (ACTOS) 30 MG tablet, Take 1 tablet (30 mg total) by mouth daily., Disp: 90 tablet, Rfl: 3   Semaglutide, 1 MG/DOSE, (OZEMPIC, 1 MG/DOSE,) 4 MG/3ML SOPN, Inject 1 mg into the skin once a week., Disp: 3 mL, Rfl: 11   spironolactone (ALDACTONE) 50 MG  tablet, Take 1 tablet (50 mg total) by mouth daily., Disp: 90 tablet, Rfl: 0   SUMAtriptan (IMITREX) 50 MG tablet, Take 1 tablet (50 mg total) by mouth as directed for Migraine May take a second dose after 2 hours if needed., Disp: 9 tablet, Rfl: 2   verapamil (VERELAN) 360 MG 24 hr capsule, Take 1 capsule (360 mg total) by mouth at bedtime., Disp: 90 capsule, Rfl: 3   Baclofen 5 MG TABS, Take 0.5 tablets as needed by oral route at bedtime for 30 days. (Patient not taking: Reported on 11/14/2023), Disp: 30 tablet, Rfl: 1   carvedilol (COREG) 6.25 MG tablet, Take 1 tablet (6.25 mg total) by mouth 2 (two) times daily with a  meal. (Patient not taking: Reported on 11/14/2023), Disp: 180 tablet, Rfl: 3   COVID-19 At Home Antigen Test (CARESTART COVID-19 HOME TEST) KIT, use as directed (Patient not taking: Reported on 11/14/2023), Disp: 2 kit, Rfl: 0   diazepam (VALIUM) 5 MG tablet, Take 1 tablet (5 mg total) by mouth 30 (thirty) minutes prior to MRI scan. May repeat once right before MRI scan if needed. You will need a driver. (Patient not taking: Reported on 11/14/2023), Disp: 2 tablet, Rfl: 0   dorzolamide-timolol (COSOPT) 2-0.5 % ophthalmic solution, Place 1 drop into the left eye 2 (two) times daily (Patient not taking: Reported on 11/14/2023), Disp: 10 mL, Rfl: 11   gabapentin (NEURONTIN) 300 MG capsule, Take 1 capsule (300 mg total) by mouth 2 (two) times daily AND 3 capsules (900 mg total) at bedtime. (Patient not taking: Reported on 11/14/2023), Disp: 450 capsule, Rfl: 3   meclizine (ANTIVERT) 25 MG tablet, Take 1 tablet (25 mg total) by mouth 3 (three) times daily as needed for Dizziness (Patient not taking: Reported on 11/14/2023), Disp: 30 tablet, Rfl: 1   meclizine (ANTIVERT) 25 MG tablet, Take 1 tablet (25 mg total) by mouth 3 (three) times daily as needed for dizziness (Patient not taking: Reported on 11/14/2023), Disp: 30 tablet, Rfl: 1   metaxalone (SKELAXIN) 800 MG tablet, Take 1 tablet (800 mg total) by mouth 3 (three) times daily as needed for Pain (Patient not taking: Reported on 11/14/2023), Disp: 30 tablet, Rfl: 5   methocarbamol (ROBAXIN) 750 MG tablet, Take 1 tablet (750 mg total) by mouth 2 (two) times daily as needed. (Patient not taking: Reported on 11/14/2023), Disp: 60 tablet, Rfl: 11   metoprolol succinate (TOPROL-XL) 25 MG 24 hr tablet, Take 1 tablet (25 mg total) by mouth once daily (Patient not taking: Reported on 11/14/2023), Disp: 90 tablet, Rfl: 3   prednisoLONE acetate (PRED FORTE) 1 % ophthalmic suspension, Place 1 drop into the left eye 4 (four) times daily. (Patient not taking: Reported on  11/14/2023), Disp: 5 mL, Rfl: 1   trimethoprim-polymyxin b (POLYTRIM) ophthalmic solution, Place 1 drop into the right eye 4 (four) times daily (Patient not taking: Reported on 11/14/2023), Disp: 10 mL, Rfl: 0  Imaging Review  Cervical Imaging: Cervical MR wo contrast: Results for orders placed during the hospital encounter of 09/24/23  MR CERVICAL SPINE WO CONTRAST  Narrative CLINICAL DATA:  Neck pain, chronic, degenerative changes on xray  EXAM: MRI CERVICAL SPINE WITHOUT CONTRAST  TECHNIQUE: Multiplanar, multisequence MR imaging of the cervical spine was performed. No intravenous contrast was administered.  COMPARISON:  MR C Spine 11/09/20  FINDINGS: Alignment: Physiologic.  Vertebrae: No fracture, evidence of discitis, or bone lesion.  Cord: Normal signal and morphology.  Posterior Fossa, vertebral arteries, paraspinal  tissues: Negative.  Disc levels:  Limitations: Motion degraded exam  C1-C2: Mild degenerative change.  C2-C3: Mild bilateral facet degenerative change. No spinal canal or neural foraminal narrowing.  C3-C4: Mild bilateral facet degenerative change. Uncovertebral hypertrophy. Mild right neural foraminal narrowing. No spinal canal narrowing.  C4-C5: Mild bilateral facet degenerative change. Uncovertebral hypertrophy. Mild bilateral neural foraminal narrowing. No spinal canal narrowing.  C5-C6: Mild bilateral facet degenerative change. Uncovertebral hypertrophy. Mild bilateral neural foraminal narrowing. No spinal canal narrowing.  C6-C7: Circumferential disc bulge. Mild spinal canal narrowing. Uncovertebral hypertrophy. Mild bilateral neural foraminal narrowing, right-greater-than-left.  C7-T1: Mild bilateral facet degenerative change. No spinal canal or neural foraminal narrowing.  IMPRESSION: The degree of motion artifact limits accurate assessment of the degree of spinal canal or neural foraminal stenosis.  1. Mild multilevel  degenerative changes without evidence of high-grade spinal canal narrowing.  2. There is mild neural foraminal narrowing at C3-C4 (right) and C4-C7 (bilateral).   Electronically Signed By: Lorenza Cambridge M.D. On: 10/10/2023 12:55  DG Cervical Spine Complete  Narrative CLINICAL DATA:  neck pain  EXAM: CERVICAL SPINE - COMPLETE 4+ VIEW  COMPARISON:  None Available.  FINDINGS: The cervical spine is visualized from C1-the superior endplate of C7. Cervical alignment is maintained. No evidence of dynamic instability on limited excursion flexion and extension views. Vertebral body heights are maintained: no evidence of acute fracture. Relative preservation of the disc spaces with flowing anterior osteophytes. No prevertebral soft tissue swelling. LEFT-sided carotid calcifications.  IMPRESSION: 1. No acute fracture or traumatic listhesis. 2. Flowing anterior osteophytes which can be seen in the setting of diffuse idiopathic skeletal hyperostosis.   Electronically Signed By: Meda Klinefelter M.D. On: 10/12/2023 14:14  CT Hip Left Wo Contrast  Narrative CLINICAL DATA:  Left hip pain after fall.  EXAM: CT OF THE LEFT HIP WITHOUT CONTRAST  TECHNIQUE: Multidetector CT imaging of the left hip was performed according to the standard protocol. Multiplanar CT image reconstructions were also generated.  COMPARISON:  CT abdomen pelvis dated July 28, 2020.  FINDINGS: Bones/Joint/Cartilage  No fracture or dislocation. Moderate right hip osteoarthritis. No joint effusion.  Ligaments  Ligaments are suboptimally evaluated by CT.  Muscles and Tendons Grossly intact.  No muscle atrophy.  Soft tissue No fluid collection or hematoma.  No soft tissue mass.  IMPRESSION: 1. No acute osseous abnormality. 2. Moderate right hip osteoarthritis.   Electronically Signed By: Obie Dredge M.D. On: 09/24/2021 15:21  DG Foot Complete Left  Narrative CLINICAL DATA:  Left  foot pain.  EXAM: LEFT FOOT - COMPLETE 3+ VIEW  COMPARISON:  None Available.  FINDINGS: There is no acute fracture or dislocation. The bones are osteopenic. Degenerative changes and osteophyte at the dorsal aspect of the anterior talus. Anchor pins noted in the posterior calcaneus. The soft tissues are unremarkable. Vascular calcifications noted.  IMPRESSION: 1. No acute fracture or dislocation. 2. Osteopenia and degenerative changes.   Electronically Signed By: Elgie Collard M.D. On: 06/11/2023 22:06      Complexity Note: Imaging results reviewed.                         ROS  Cardiovascular: Daily Aspirin intake, Heart surgery, and Heart catheterization Pulmonary or Respiratory: Wheezing and difficulty taking a deep full breath (Asthma) and Temporary stoppage of breathing during sleep Neurological: Abnormal skin sensations (Peripheral Neuropathy) Psychological-Psychiatric: No reported psychological or psychiatric signs or symptoms such as difficulty sleeping, anxiety, depression, delusions  or hallucinations (schizophrenial), mood swings (bipolar disorders) or suicidal ideations or attempts Gastrointestinal: Reflux or heatburn and Scarred liver (Cirrhosis) Genitourinary: No reported renal or genitourinary signs or symptoms such as difficulty voiding or producing urine, peeing blood, non-functioning kidney, kidney stones, difficulty emptying the bladder, difficulty controlling the flow of urine, or chronic kidney disease Hematological: No reported hematological signs or symptoms such as prolonged bleeding, low or poor functioning platelets, bruising or bleeding easily, hereditary bleeding problems, low energy levels due to low hemoglobin or being anemic Endocrine: High blood sugar requiring insulin (IDDM) Rheumatologic: Joint aches and or swelling due to excess weight (Osteoarthritis) Musculoskeletal: Negative for myasthenia gravis, muscular dystrophy, multiple sclerosis or  malignant hyperthermia Work History: Retiredpt did not mark work history on questionnaire  Allergies  Robert Fernandez is allergic to rosuvastatin, augmentin [amoxicillin-pot clavulanate], codeine, and dilaudid [hydromorphone hcl].  Laboratory Chemistry Profile   Renal Lab Results  Component Value Date   BUN 19 09/07/2022   CREATININE 0.75 09/07/2022   GFRNONAA >60 09/07/2022     Electrolytes Lab Results  Component Value Date   NA 137 09/07/2022   K 4.0 09/07/2022   CL 103 09/07/2022   CALCIUM 9.1 09/07/2022     Hepatic Lab Results  Component Value Date   AST 24 09/07/2022   ALT 20 09/07/2022   ALBUMIN 3.7 09/07/2022   ALKPHOS 78 09/07/2022     ID No results found for: "LYMEIGGIGMAB", "HIV", "SARSCOV2NAA", "STAPHAUREUS", "MRSAPCR", "HCVAB", "PREGTESTUR", "RMSFIGG", "QFVRPH1IGG", "QFVRPH2IGG"   Bone No results found for: "VD25OH", "VD125OH2TOT", "WU9811BJ4", "NW2956OZ3", "25OHVITD1", "25OHVITD2", "25OHVITD3", "TESTOFREE", "TESTOSTERONE"   Endocrine Lab Results  Component Value Date   GLUCOSE 220 (H) 09/07/2022     Neuropathy No results found for: "VITAMINB12", "FOLATE", "HGBA1C", "HIV"   CNS No results found for: "COLORCSF", "APPEARCSF", "RBCCOUNTCSF", "WBCCSF", "POLYSCSF", "LYMPHSCSF", "EOSCSF", "PROTEINCSF", "GLUCCSF", "JCVIRUS", "CSFOLI", "IGGCSF", "LABACHR", "ACETBL"   Inflammation (CRP: Acute  ESR: Chronic) Lab Results  Component Value Date   ESRSEDRATE 8 09/07/2022     Rheumatology No results found for: "RF", "ANA", "LABURIC", "URICUR", "LYMEIGGIGMAB", "LYMEABIGMQN", "HLAB27"   Coagulation Lab Results  Component Value Date   PLT 122 (L) 09/07/2022     Cardiovascular Lab Results  Component Value Date   HGB 13.6 09/07/2022   HCT 39.4 09/07/2022     Screening No results found for: "SARSCOV2NAA", "COVIDSOURCE", "STAPHAUREUS", "MRSAPCR", "HCVAB", "HIV", "PREGTESTUR"   Cancer No results found for: "CEA", "CA125", "LABCA2"   Allergens No results  found for: "ALMOND", "APPLE", "ASPARAGUS", "AVOCADO", "BANANA", "BARLEY", "BASIL", "BAYLEAF", "GREENBEAN", "LIMABEAN", "WHITEBEAN", "BEEFIGE", "REDBEET", "BLUEBERRY", "BROCCOLI", "CABBAGE", "MELON", "CARROT", "CASEIN", "CASHEWNUT", "CAULIFLOWER", "CELERY"     Note: Lab results reviewed.  PFSH  Drug: Robert Fernandez  reports that he does not currently use drugs. Alcohol:  reports no history of alcohol use. Tobacco:  reports that he has never smoked. He has never used smokeless tobacco. Medical:  has a past medical history of Coronary artery disease and Diabetes mellitus without complication (HCC). Family: family history includes Breast cancer in his mother and sister.  History reviewed. No pertinent surgical history. Active Ambulatory Problems    Diagnosis Date Noted   Ankle fracture 11/06/2018   Asthma 08/13/2019   Cervical radiculitis 10/26/2020   Chest pain 11/06/2018   Chronic diarrhea 09/29/2020   Cirrhosis of liver (HCC) 07/20/2022   Coronary artery disease involving native heart without angina pectoris 11/06/2018   Diabetic retinopathy of both eyes (HCC) 06/01/2020   Dysfunction of Eustachian tube, bilateral 09/29/2020   Erectile  dysfunction 08/13/2019   Facial droop 11/06/2018   Hepatosplenomegaly 06/28/2020   History of deep venous thrombosis 09/06/2019   HLD (hyperlipidemia) 11/06/2018   HTN (hypertension) 11/06/2018   Hx of CABG 11/06/2018   Left adrenal mass (HCC) 06/28/2020   Meningioma (HCC) 11/26/2018   Migraines 09/06/2019   Myofascial pain 08/16/2022   Neck pain 10/26/2020   Neovascular glaucoma, indeterminate stage, 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   Nocturnal leg cramps 09/29/2020   Obstructive sleep apnea 03/03/2022   Osteoarthritis 07/15/2015   Osteopenia 11/06/2018   Overweight (BMI 25.0-29.9) 10/02/2018   Pain of left calf 06/29/2022   Pain of left lower leg 06/29/2022   Plantar fasciitis 09/06/2019   Preoperative evaluation of a medical condition to rule out surgical  contraindications (TAR required) 07/19/2023   Raised intraocular pressure of right eye 09/12/2022   Slurred speech 11/06/2018   SOBOE (shortness of breath on exertion) 07/15/2020   Strain of calf muscle 06/29/2022   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 In no apparent acute distress Vitals:   11/14/23 0803  BP: 109/68  Pulse: 71  Temp: (!) 97.2 F (36.2 C)  SpO2: 96%  Weight: 218 lb (98.9 kg)  Height: 6' (1.829 m)   BMI Assessment: Estimated body mass index is 29.57 kg/m as calculated from the following:   Height as of this encounter: 6' (1.829 m).   Weight as of this encounter: 218 lb (98.9 kg).  BMI interpretation table: BMI level Category Range association with higher incidence of chronic pain  <18 kg/m2 Underweight   18.5-24.9 kg/m2 Ideal body weight   25-29.9 kg/m2 Overweight Increased incidence by 20%  30-34.9 kg/m2 Obese (Class I) Increased incidence by 68%  35-39.9 kg/m2 Severe obesity (Class II) Increased incidence by 136%  >40 kg/m2 Extreme obesity (Class III) Increased incidence by 254%   Patient's current BMI Ideal Body weight  Body mass index is 29.57 kg/m. Ideal body weight: 77.6 kg (171 lb 1.2 oz) Adjusted ideal body weight: 86.1 kg (189 lb 13.5 oz)   BMI Readings from Last 4 Encounters:  11/14/23 29.57 kg/m  09/16/23 29.43 kg/m  06/11/23 28.75 kg/m  09/08/22 25.41 kg/m   Wt Readings from Last 4  Encounters:  11/14/23 218 lb (98.9 kg)  09/16/23 217 lb (98.4 kg)  06/11/23 212 lb (96.2 kg)  09/08/22 187 lb 6.3 oz (85 kg)    Psych/Mental status: Alert, oriented x 3 (person, place, & time)       Eyes: PERLA Respiratory: No evidence of acute respiratory distress  Cervical Spine Area Exam  Skin & Axial Inspection: No masses, redness, edema, swelling, or associated skin lesions Alignment: Symmetrical Functional ROM: Pain restricted ROM, bilaterally Stability: No instability detected Muscle Tone/Strength: Functionally intact. No obvious neuro-muscular anomalies detected. Sensory (Neurological): Dermatomal pain pattern Palpation: No palpable anomalies             Upper Extremity (UE) Exam    Side: Right upper extremity  Side: Left upper extremity  Skin & Extremity Inspection: Skin color, temperature, and hair growth are WNL. No peripheral edema or cyanosis. No masses, redness, swelling, asymmetry, or associated skin lesions. No contractures.  Skin & Extremity Inspection: Skin color, temperature, and hair growth are WNL. No peripheral edema or cyanosis. No masses, redness, swelling, asymmetry, or associated skin lesions. No contractures.  Functional ROM: Pain restricted ROM for shoulder  Functional ROM: Unrestricted ROM          Muscle Tone/Strength: Functionally intact. No obvious neuro-muscular anomalies detected.  Muscle Tone/Strength: Functionally intact. No obvious neuro-muscular anomalies detected.  Sensory (Neurological): Arthropathic arthralgia          Sensory (Neurological): Unimpaired          Palpation: No palpable anomalies              Palpation: No palpable anomalies              Provocative Test(s):  Phalen's test: deferred Tinel's test: deferred Apley's scratch test (touch opposite shoulder):  Action 1 (Across chest): Decreased ROM Action 2 (Overhead): Decreased ROM Action 3 (LB reach): Decreased ROM   Provocative Test(s):  Phalen's test: deferred Tinel's test:  deferred Apley's scratch test (touch opposite shoulder):  Action 1 (Across chest): deferred Action 2 (Overhead): deferred Action 3 (LB reach): deferred     Assessment  Primary Diagnosis & Pertinent Problem List: The primary encounter diagnosis was Localized osteoarthritis of right shoulder. Diagnoses of Chronic right shoulder pain and Cervical radicular pain were also pertinent to this visit.  Visit Diagnosis (New problems to examiner): 1. Localized osteoarthritis of right shoulder   2. Chronic right shoulder pain   3. Cervical radicular pain    Plan of Care (Initial workup plan)      Right Shoulder Pain Severe right shoulder pain has significantly impaired his ability to lift objects or perform daily activities for over a year. The pain worsens with movement and when lying on the affected shoulder. An MRI has been ordered by an orthopedist, who plans surgical intervention with hardware placement due to the ineffectiveness of a previous unguided injection. We discussed the risks of a steroid injection potentially delaying surgery if not approved by the orthopedic surgeon, though it could offer temporary pain relief. As an alternative, we considered an injection without steroid. We will confirm with the orthopedic surgeon regarding the steroid injection before surgery, perform the right shoulder injection under x-ray guidance if approved, administer IV sedation during the procedure, and ensure he has a driver and fast from the night before the procedure.  Cervical Radiculopathy He has chronic neck pain radiating to the arms, associated with bone spurs and a limited range of motion, leading to significant pain, weakness, and hand freezing. Previous x-rays have confirmed the presence of bone spurs. C-MRI shows Mild multilevel degenerative changes without evidence of high-grade spinal canal narrowing and mild-moderate neural foraminal narrowing at C4-5 and on right at C3-4. We discussed a  cervical epidural injection without steroid due to his diabetes, offering potential pain relief. An alternative is using saline and local anesthetic instead of steroid. We will perform the cervical epidural injection without steroid, administer IV sedation during the procedure, and monitor his A1c levels, aiming for a target of 7.5 by December.  Diabetic Neuropathy He experiences burning and tingling in his feet, consistent with diabetic neuropathy. Currently, he is on gabapentin, which has limited efficacy and side effects. We discussed a potential alternative treatment with Qutenza, a capsaicin-based film, which could reduce pain  and allow for a reduction in gabapentin dosage. The alternative is to continue current management. We will consider Qutenza treatment if current management remains ineffective.  General Health Maintenance Diabetes management is crucial, with the goal of lowering A1c to target levels. We emphasized the importance of maintaining an A1c below 7.5 for surgical clearance. We will monitor his A1c levels, aiming for a target of 7.5 by December.       Procedure Orders         SHOULDER INJECTION         Cervical Epidural Injection    Interventional management options: Robert Fernandez was informed that there is no guarantee that he would be a candidate for interventional therapies. The decision will be based on the results of diagnostic studies, as well as Robert Fernandez's risk profile.  Procedure(s) under consideration:  Cervical epidural Right shoulder injection Right suprascapular nerve block Qutenza     Provider-requested follow-up: Return in about 27 days (around 12/11/2023) for C-ESI and Right shoulder injection, in clinic IV Versed.  Future Appointments  Date Time Provider Department Center  11/27/2023  9:20 AM [facility] [place] [facility] 1 [facility]  12/05/2023  9:30 AM Drake Leach, PA-C [facility] None  12/11/2023  9:20 AM Edward Jolly, MD [facility] None  12/13/2023   2:00 PM Debbe Odea, MD [facility] None  12/24/2023  2:00 PM Midge Minium, MD [facility] None    Duration of encounter: .  Total time on encounter, as per AMA guidelines included both the face-to-face and non-face-to-face time personally spent by the physician and/or other qualified health care professional(s) on the day of the encounter (includes time in activities that require the physician or other qualified health care professional and does not include time in activities normally performed by clinical staff). Physician's time may include the following activities when performed: Preparing to see the patient (e.g., pre-charting review of records, searching for previously ordered imaging, lab work, and nerve conduction tests) Review of prior analgesic pharmacotherapies. Reviewing PMP Interpreting ordered tests (e.g., lab work, imaging, nerve conduction tests) Performing post-procedure evaluations, including interpretation of diagnostic procedures Obtaining and/or reviewing separately obtained history Performing a medically appropriate examination and/or evaluation Counseling and educating the patient/family/caregiver Ordering medications, tests, or procedures Referring and communicating with other health care professionals (when not separately reported) Documenting clinical information in the electronic or other health record Independently interpreting results (not separately reported) and communicating results to the patient/ family/caregiver Care coordination (not separately reported)  Note by: Edward Jolly, MD (AI and TTS technology used. I apologize for any typographical errors that were not detected and corrected.) Date: 11/14/2023; Time: 9:07 AM

## 2023-11-14 NOTE — Patient Instructions (Signed)
______________________________________________________________________    Preparing for your procedure  Appointments: If you think you may not be able to keep your appointment, call 24-48 hours in advance to cancel. We need time to make it available to others.  During your procedure appointment there will be: No Prescription Refills. No disability issues to discussed. No medication changes or discussions.  Instructions: Food intake: Avoid eating anything solid for at least 8 hours prior to your procedure. Clear liquid intake: You may take clear liquids such as water up to 2 hours prior to your procedure. (No carbonated drinks. No soda.) Transportation: Unless otherwise stated by your physician, bring a driver. (Driver cannot be a Market researcher, Pharmacist, community, or any other form of public transportation.) Morning Medicines: Except for blood thinners, take all of your other morning medications with a sip of water. Make sure to take your heart and blood pressure medicines. If your blood pressure's lower number is above 100, the case will be rescheduled. Blood thinners: Make sure to stop your blood thinners as instructed.  If you take a blood thinner, but were not instructed to stop it, call our office [phone] and ask to talk to a nurse. Not stopping a blood thinner prior to certain procedures could lead to serious complications. Diabetics on insulin: Notify the staff so that you can be scheduled 1st case in the morning. If your diabetes requires high dose insulin, take only  of your normal insulin dose the morning of the procedure and notify the staff that you have done so. Preventing infections: Shower with an antibacterial soap the morning of your procedure.  Build-up your immune system: Take 1000 mg of Vitamin C with every meal (3 times a day) the day prior to your procedure. Antibiotics: Inform the nursing staff if you are taking any antibiotics or if you have any conditions that may require antibiotics  prior to procedures. (Example: recent joint implants)   Pregnancy: If you are pregnant make sure to notify the nursing staff. Not doing so may result in injury to the fetus, including death.  Sickness: If you have a cold, fever, or any active infections, call and cancel or reschedule your procedure. Receiving steroids while having an infection may result in complications. Arrival: You must be in the facility at least 30 minutes prior to your scheduled procedure. Tardiness: Your scheduled time is also the cutoff time. If you do not arrive at least 15 minutes prior to your procedure, you will be rescheduled.  Children: Do not bring any children with you. Make arrangements to keep them home. Dress appropriately: There is always a possibility that your clothing may get soiled. Avoid long dresses. Valuables: Do not bring any jewelry or valuables.  Reasons to call and reschedule or cancel your procedure: (Following these recommendations will minimize the risk of a serious complication.) Surgeries: Avoid having procedures within 2 weeks of any surgery. (Avoid for 2 weeks before or after any surgery). Flu Shots: Avoid having procedures within 2 weeks of a flu shots or . (Avoid for 2 weeks before or after immunizations). Barium: Avoid having a procedure within 7-10 days after having had a radiological study involving the use of radiological contrast. (Myelograms, Barium swallow or enema study). Heart attacks: Avoid any elective procedures or surgeries for the initial 6 months after a "Myocardial Infarction" (Heart Attack). Blood thinners: It is imperative that you stop these medications before procedures. Let us know if you if you take any blood thinner.  Infection: Avoid procedures during or within  two weeks of an infection (including chest colds or gastrointestinal problems). Symptoms associated with infections include: Localized redness, fever, chills, night sweats or profuse sweating, burning sensation  when voiding, cough, congestion, stuffiness, runny nose, sore throat, diarrhea, nausea, vomiting, cold or Flu symptoms, recent or current infections. It is specially important if the infection is over the area that we intend to treat. Heart and lung problems: Symptoms that may suggest an active cardiopulmonary problem include: cough, chest pain, breathing difficulties or shortness of breath, dizziness, ankle swelling, uncontrolled high or unusually low blood pressure, and/or palpitations. If you are experiencing any of these symptoms, cancel your procedure and contact your primary care physician for an evaluation.  Remember:  Regular Business hours are:  Monday to Thursday 8:00 AM to 4:00 PM  Provider's Schedule: Delano Metz, MD:  Procedure days: Tuesday and Thursday 7:30 AM to 4:00 PM  Edward Jolly, MD:  Procedure days: Monday and Wednesday 7:30 AM to 4:00 PM Last  Updated: 08/13/2023 ______________________________________________________________________      ______________________________________________________________________    General Risks and Possible Complications  Patient Responsibilities: It is important that you read this as it is part of your informed consent. It is our duty to inform you of the risks and possible complications associated with treatments offered to you. It is your responsibility as a patient to read this and to ask questions about anything that is not clear or that you believe was not covered in this document.  Patient's Rights: You have the right to refuse treatment. You also have the right to change your mind, even after initially having agreed to have the treatment done. However, under this last option, if you wait until the last second to change your mind, you may be charged for the materials used up to that point.  Introduction: Medicine is not an Visual merchandiser. Everything in Medicine, including the lack of treatment(s), carries the potential for  danger, harm, or loss (which is by definition: Risk). In Medicine, a complication is a secondary problem, condition, or disease that can aggravate an already existing one. All treatments carry the risk of possible complications. The fact that a side effects or complications occurs, does not imply that the treatment was conducted incorrectly. It must be clearly understood that these can happen even when everything is done following the highest safety standards.  No treatment: You can choose not to proceed with the proposed treatment alternative. The "PRO(s)" would include: avoiding the risk of complications associated with the therapy. The "CON(s)" would include: not getting any of the treatment benefits. These benefits fall under one of three categories: diagnostic; therapeutic; and/or palliative. Diagnostic benefits include: getting information which can ultimately lead to improvement of the disease or symptom(s). Therapeutic benefits are those associated with the successful treatment of the disease. Finally, palliative benefits are those related to the decrease of the primary symptoms, without necessarily curing the condition (example: decreasing the pain from a flare-up of a chronic condition, such as incurable terminal cancer).  General Risks and Complications: These are associated to most interventional treatments. They can occur alone, or in combination. They fall under one of the following six (6) categories: no benefit or worsening of symptoms; bleeding; infection; nerve damage; allergic reactions; and/or death. No benefits or worsening of symptoms: In Medicine there are no guarantees, only probabilities. No healthcare provider can ever guarantee that a medical treatment will work, they can only state the probability that it may. Furthermore, there is always the possibility that the  condition may worsen, either directly, or indirectly, as a consequence of the treatment. Bleeding: This is more common if  the patient is taking a blood thinner, either prescription or over the counter (example: Goody Powders, Fish oil, Aspirin, Garlic, etc.), or if suffering a condition associated with impaired coagulation (example: Hemophilia, cirrhosis of the liver, low platelet counts, etc.). However, even if you do not have one on these, it can still happen. If you have any of these conditions, or take one of these drugs, make sure to notify your treating physician. Infection: This is more common in patients with a compromised immune system, either due to disease (example: diabetes, cancer, human immunodeficiency virus [HIV], etc.), or due to medications or treatments (example: therapies used to treat cancer and rheumatological diseases). However, even if you do not have one on these, it can still happen. If you have any of these conditions, or take one of these drugs, make sure to notify your treating physician. Nerve Damage: This is more common when the treatment is an invasive one, but it can also happen with the use of medications, such as those used in the treatment of cancer. The damage can occur to small secondary nerves, or to large primary ones, such as those in the spinal cord and brain. This damage may be temporary or permanent and it may lead to impairments that can range from temporary numbness to permanent paralysis and/or brain death. Allergic Reactions: Any time a substance or material comes in contact with our body, there is the possibility of an allergic reaction. These can range from a mild skin rash (contact dermatitis) to a severe systemic reaction (anaphylactic reaction), which can result in death. Death: In general, any medical intervention can result in death, most of the time due to an unforeseen complication. ______________________________________________________________________     Epidural Steroid Injection Patient Information  Description: The epidural space surrounds the nerves as they exit  the spinal cord.  In some patients, the nerves can be compressed and inflamed by a bulging disc or a tight spinal canal (spinal stenosis).  By injecting steroids into the epidural space, we can bring irritated nerves into direct contact with a potentially helpful medication.  These steroids act directly on the irritated nerves and can reduce swelling and inflammation which often leads to decreased pain.  Epidural steroids may be injected anywhere along the spine and from the neck to the low back depending upon the location of your pain.   After numbing the skin with local anesthetic (like Novocaine), a small needle is passed into the epidural space slowly.  You may experience a sensation of pressure while this is being done.  The entire block usually last less than 10 minutes.  Conditions which may be treated by epidural steroids:  Low back and leg pain Neck and arm pain Spinal stenosis Post-laminectomy syndrome Herpes zoster (shingles) pain Pain from compression fractures  Preparation for the injection:  Do not eat any solid food or dairy products within 8 hours of your appointment.  You may drink clear liquids up to 3 hours before appointment.  Clear liquids include water, black coffee, juice or soda.  No milk or cream please. You may take your regular medication, including pain medications, with a sip of water before your appointment  Diabetics should hold regular insulin (if taken separately) and take 1/2 normal NPH dos the morning of the procedure.  Carry some sugar containing items with you to your appointment. A driver must accompany you  and be prepared to drive you home after your procedure.  Bring all your current medications with your. An IV may be inserted and sedation may be given at the discretion of the physician.   A blood pressure cuff, EKG and other monitors will often be applied during the procedure.  Some patients may need to have extra oxygen administered for a short  period. You will be asked to provide medical information, including your allergies, prior to the procedure.  We must know immediately if you are taking blood thinners (like Coumadin/Warfarin)  Or if you are allergic to IV iodine contrast (dye). We must know if you could possible be pregnant.  Possible side-effects: Bleeding from needle site Infection (rare, may require surgery) Nerve injury (rare) Numbness & tingling (temporary) Difficulty urinating (rare, temporary) Spinal headache ( a headache worse with upright posture) Light -headedness (temporary) Pain at injection site (several days) Decreased blood pressure (temporary) Weakness in arm/leg (temporary) Pressure sensation in back/neck (temporary)  Call if you experience: Fever/chills associated with headache or increased back/neck pain. Headache worsened by an upright position. New onset weakness or numbness of an extremity below the injection site Hives or difficulty breathing (go to the emergency room) Inflammation or drainage at the infection site Severe back/neck pain Any new symptoms which are concerning to you  Please note:  Although the local anesthetic injected can often make your back or neck feel good for several hours after the injection, the pain will likely return.  It takes 3-7 days for steroids to work in the epidural space.  You may not notice any pain relief for at least that one week.  If effective, we will often do a series of three injections spaced 3-6 weeks apart to maximally decrease your pain.  After the initial series, we generally will wait several months before considering a repeat injection of the same type.  If you have any questions, please call [phone] [facility]

## 2023-11-14 NOTE — Progress Notes (Signed)
Safety precautions to be maintained throughout the outpatient stay will include: orient to surroundings, keep bed in low position, maintain call bell within reach at all times, provide assistance with transfer out of bed and ambulation.  

## 2023-11-18 ENCOUNTER — Other Ambulatory Visit: Payer: Self-pay

## 2023-11-18 ENCOUNTER — Other Ambulatory Visit (HOSPITAL_COMMUNITY): Payer: Self-pay

## 2023-11-18 MED ORDER — SPIRONOLACTONE 50 MG PO TABS
50.0000 mg | ORAL_TABLET | Freq: Every day | ORAL | 1 refills | Status: DC
  Filled 2023-11-18: qty 90, 90d supply, fill #0
  Filled 2024-04-16: qty 90, 90d supply, fill #1

## 2023-11-19 ENCOUNTER — Other Ambulatory Visit: Payer: Self-pay

## 2023-11-26 ENCOUNTER — Other Ambulatory Visit: Payer: Self-pay

## 2023-11-26 MED ORDER — BRIMONIDINE TARTRATE 0.2 % OP SOLN
1.0000 [drp] | Freq: Two times a day (BID) | OPHTHALMIC | 4 refills | Status: DC
  Filled 2023-11-26: qty 5, 50d supply, fill #0

## 2023-11-27 ENCOUNTER — Ambulatory Visit
Admission: RE | Admit: 2023-11-27 | Discharge: 2023-11-27 | Disposition: A | Discharge status: Home / Self Care | Payer: Commercial Managed Care - PPO | Source: Ambulatory Visit | Attending: Orthopedic Surgery | Admitting: Orthopedic Surgery

## 2023-11-27 DIAGNOSIS — M67813 Other specified disorders of tendon, right shoulder: Secondary | ICD-10-CM | POA: Diagnosis not present

## 2023-11-27 DIAGNOSIS — M75111 Incomplete rotator cuff tear or rupture of right shoulder, not specified as traumatic: Secondary | ICD-10-CM | POA: Diagnosis not present

## 2023-11-27 DIAGNOSIS — M19011 Primary osteoarthritis, right shoulder: Secondary | ICD-10-CM

## 2023-11-28 ENCOUNTER — Other Ambulatory Visit: Payer: Self-pay

## 2023-11-28 DIAGNOSIS — H4053X4 Glaucoma secondary to other eye disorders, bilateral, indeterminate stage: Secondary | ICD-10-CM | POA: Diagnosis not present

## 2023-11-28 MED ORDER — DORZOLAMIDE HCL-TIMOLOL MAL 2-0.5 % OP SOLN
1.0000 [drp] | Freq: Two times a day (BID) | OPHTHALMIC | 4 refills | Status: DC
  Filled 2023-11-28: qty 10, 35d supply, fill #0
  Filled 2023-11-29: qty 10, 90d supply, fill #0
  Filled 2024-02-14 (×2): qty 10, 90d supply, fill #1
  Filled 2024-02-14: qty 30, 300d supply, fill #1

## 2023-11-28 MED ORDER — BRIMONIDINE TARTRATE 0.2 % OP SOLN
1.0000 [drp] | Freq: Two times a day (BID) | OPHTHALMIC | 4 refills | Status: DC
  Filled 2023-11-28: qty 5, 20d supply, fill #0
  Filled 2023-11-29: qty 5, 50d supply, fill #0
  Filled 2024-01-08: qty 10, 90d supply, fill #0
  Filled 2024-03-18: qty 10, 90d supply, fill #1
  Filled 2024-03-20: qty 10, 50d supply, fill #1
  Filled 2024-05-05: qty 10, 50d supply, fill #2
  Filled 2024-09-09: qty 10, 50d supply, fill #3

## 2023-11-29 ENCOUNTER — Other Ambulatory Visit: Payer: Self-pay

## 2023-12-02 ENCOUNTER — Other Ambulatory Visit: Payer: Self-pay

## 2023-12-05 ENCOUNTER — Ambulatory Visit: Payer: Medicare Other | Admitting: Orthopedic Surgery

## 2023-12-09 DIAGNOSIS — M75111 Incomplete rotator cuff tear or rupture of right shoulder, not specified as traumatic: Secondary | ICD-10-CM | POA: Diagnosis not present

## 2023-12-09 DIAGNOSIS — M7521 Bicipital tendinitis, right shoulder: Secondary | ICD-10-CM | POA: Diagnosis not present

## 2023-12-09 DIAGNOSIS — M7501 Adhesive capsulitis of right shoulder: Secondary | ICD-10-CM | POA: Diagnosis not present

## 2023-12-09 DIAGNOSIS — M7581 Other shoulder lesions, right shoulder: Secondary | ICD-10-CM | POA: Diagnosis not present

## 2023-12-11 ENCOUNTER — Ambulatory Visit
Admission: RE | Admit: 2023-12-11 | Discharge: 2023-12-11 | Disposition: A | Discharge status: Home / Self Care | Payer: Commercial Managed Care - PPO | Source: Ambulatory Visit | Attending: Student in an Organized Health Care Education/Training Program | Admitting: Student in an Organized Health Care Education/Training Program

## 2023-12-11 ENCOUNTER — Ambulatory Visit
Payer: Commercial Managed Care - PPO | Attending: Student in an Organized Health Care Education/Training Program | Admitting: Student in an Organized Health Care Education/Training Program

## 2023-12-11 ENCOUNTER — Encounter: Payer: Self-pay | Admitting: Student in an Organized Health Care Education/Training Program

## 2023-12-11 VITALS — BP 173/104 | HR 79 | Temp 97.0°F | Resp 16 | Ht 72.0 in | Wt 200.0 lb

## 2023-12-11 DIAGNOSIS — M5412 Radiculopathy, cervical region: Secondary | ICD-10-CM | POA: Diagnosis not present

## 2023-12-11 MED ORDER — MIDAZOLAM HCL 2 MG/2ML IJ SOLN
INTRAMUSCULAR | Status: AC
  Filled 2023-12-11: qty 2

## 2023-12-11 MED ORDER — ROPIVACAINE HCL 2 MG/ML IJ SOLN
INTRAMUSCULAR | Status: AC
Start: 2023-12-11 — End: ?
  Filled 2023-12-11: qty 20

## 2023-12-11 MED ORDER — ROPIVACAINE HCL 2 MG/ML IJ SOLN
1.0000 mL | Freq: Once | INTRAMUSCULAR | Status: AC
  Administered 2023-12-11: 1 mL via EPIDURAL

## 2023-12-11 MED ORDER — LACTATED RINGERS IV SOLN: Freq: Once | INTRAVENOUS | Status: AC

## 2023-12-11 MED ORDER — IOHEXOL 180 MG/ML  SOLN
10.0000 mL | Freq: Once | INTRAMUSCULAR | Status: AC
  Administered 2023-12-11: 10 mL via EPIDURAL

## 2023-12-11 MED ORDER — DEXAMETHASONE SODIUM PHOSPHATE 10 MG/ML IJ SOLN
INTRAMUSCULAR | Status: AC
Start: 2023-12-11 — End: ?
  Filled 2023-12-11: qty 1

## 2023-12-11 MED ORDER — DEXAMETHASONE SODIUM PHOSPHATE 10 MG/ML IJ SOLN
10.0000 mg | Freq: Once | INTRAMUSCULAR | Status: AC
  Administered 2023-12-11: 10 mg

## 2023-12-11 MED ORDER — LIDOCAINE HCL 2 % IJ SOLN
20.0000 mL | Freq: Once | INTRAMUSCULAR | Status: AC
  Administered 2023-12-11: 100 mg

## 2023-12-11 MED ORDER — LIDOCAINE HCL (PF) 2 % IJ SOLN
INTRAMUSCULAR | Status: AC
Start: 2023-12-11 — End: ?
  Filled 2023-12-11: qty 10

## 2023-12-11 MED ORDER — SODIUM CHLORIDE 0.9% FLUSH
1.0000 mL | Freq: Once | INTRAVENOUS | Status: AC
  Administered 2023-12-11: 1 mL

## 2023-12-11 MED ORDER — SODIUM CHLORIDE (PF) 0.9 % IJ SOLN
INTRAMUSCULAR | Status: AC
Start: 2023-12-11 — End: ?
  Filled 2023-12-11: qty 10

## 2023-12-11 MED ORDER — IOHEXOL 180 MG/ML  SOLN
INTRAMUSCULAR | Status: AC
Start: 2023-12-11 — End: ?
  Filled 2023-12-11: qty 20

## 2023-12-11 MED ORDER — MIDAZOLAM HCL 2 MG/2ML IJ SOLN
0.5000 mg | Freq: Once | INTRAMUSCULAR | Status: AC
  Administered 2023-12-11: 2 mg via INTRAVENOUS

## 2023-12-11 NOTE — Patient Instructions (Signed)

## 2023-12-11 NOTE — Progress Notes (Signed)
Safety precautions to be maintained throughout the outpatient stay will include: orient to surroundings, keep bed in low position, maintain call bell within reach at all times, provide assistance with transfer out of bed and ambulation.  

## 2023-12-11 NOTE — Progress Notes (Signed)
PROVIDER NOTE: Interpretation of information contained herein should be left to medically-trained personnel. Specific patient instructions are provided elsewhere under "Patient Instructions" section of medical record. This document was created in part using STT-dictation technology, any transcriptional errors that may result from this process are unintentional.  Patient: Robert Fernandez Type: Established DOB: 01-20-1956 MRN: [id number] PCP: Lynwood Dawley, MD  Service: Procedure DOS: 12/11/2023 Setting: Ambulatory Location: Ambulatory outpatient facility Delivery: Face-to-face Provider: Edward Jolly, MD Specialty: Interventional Pain Management Specialty designation: 09 Location: Outpatient facility Ref. Prov.: Lynwood Dawley, MD       Interventional Therapy   Procedure: Cervical Epidural Steroid injection (CESI) (Interlaminar) #1  Laterality: Right  Level: C7-T1 Imaging: Fluoroscopy-assisted DOS: 12/11/2023  Performed by: Edward Jolly, MD Anesthesia: Local anesthesia (1-2% Lidocaine) Sedation: Minimal Sedation                         Purpose: Diagnostic/Therapeutic Indications: Cervicalgia, cervical radicular pain, degenerative disc disease, severe enough to impact quality of life or function. 1. Cervical radicular pain    NAS-11 score:   Pre-procedure: 8 /10   Post-procedure: 0-No pain/10      Position  Prep  Materials:  Location setting: Procedure suite Position: Prone, on modified reverse trendelenburg to facilitate breathing, with head in head-cradle. Pillows positioned under chest (below chin-level) with cervical spine flexed. Safety Precautions: Patient was assessed for positional comfort and pressure points before starting the procedure. Prepping solution: DuraPrep (Iodine Povacrylex [0.7% available iodine] and Isopropyl Alcohol, 74% w/w) Prep Area: Entire  cervicothoracic region Approach: percutaneous, paramedial Intended target: Posterior cervical  epidural space Materials Procedure:  Tray: Epidural Needle(s): Epidural (Tuohy) Qty: 1 Length: (90mm) 3.5-inch Gauge: 22G   H&P (Pre-op Assessment):  Robert Fernandez is a 67 y.o. (year old), male patient, seen today for interventional treatment. He  has no past surgical history on file. Robert Fernandez has a current medication list which includes the following prescription(s): albuterol, aspirin ec, atropine, brimonidine, brimonidine, carvedilol, carvedilol, freestyle libre 14 day reader, freestyle libre 14 day sensor, freestyle libre 14 day sensor, carestart covid-19 home test, diazepam, dorzolamide-timolol, dorzolamide-timolol, trulicity, jardiance, repatha sureclick, furosemide, gabapentin, gabapentin, insulin degludec flextouch, unifine pentips plus, losartan, meclizine, meclizine, metaxalone, methocarbamol, metoprolol succinate, omeprazole, pioglitazone, ozempic (1 mg/dose), spironolactone, sumatriptan, trimethoprim-polymyxin b, verapamil, and baclofen. His primarily concern today is the Neck Pain (Neck and right shoulder and right arm)  Initial Vital Signs:  Pulse/HCG Rate: 79ECG Heart Rate: 75 Temp: (!) 97 F (36.1 C) Resp: 18 BP: 109/64 SpO2: 97 %  BMI: Estimated body mass index is 27.12 kg/m as calculated from the following:   Height as of this encounter: 6' (1.829 m).   Weight as of this encounter: 200 lb (90.7 kg).  Risk Assessment: Allergies: Reviewed. He is allergic to rosuvastatin, augmentin [amoxicillin-pot clavulanate], codeine, and dilaudid [hydromorphone hcl].  Allergy Precautions: None required Coagulopathies: Reviewed. None identified.  Blood-thinner therapy: None at this time Active Infection(s): Reviewed. None identified. Robert Fernandez is afebrile  Site Confirmation: Robert Fernandez was asked to confirm the procedure and laterality before marking the site Procedure checklist: Completed Consent: Before the procedure and under the influence of no sedative(s), amnesic(s),  or anxiolytics, the patient was informed of the treatment options, risks and possible complications. To fulfill our ethical and legal obligations, as recommended by the American Medical Association's Code of Ethics, I have informed the patient of my clinical impression; the nature and purpose of the treatment or procedure; the  risks, benefits, and possible complications of the intervention; the alternatives, including doing nothing; the risk(s) and benefit(s) of the alternative treatment(s) or procedure(s); and the risk(s) and benefit(s) of doing nothing. The patient was provided information about the general risks and possible complications associated with the procedure. These may include, but are not limited to: failure to achieve desired goals, infection, bleeding, organ or nerve damage, allergic reactions, paralysis, and death. In addition, the patient was informed of those risks and complications associated to Spine-related procedures, such as failure to decrease pain; infection (i.e.: Meningitis, epidural or intraspinal abscess); bleeding (i.e.: epidural hematoma, subarachnoid hemorrhage, or any other type of intraspinal or peri-dural bleeding); organ or nerve damage (i.e.: Any type of peripheral nerve, nerve root, or spinal cord injury) with subsequent damage to sensory, motor, and/or autonomic systems, resulting in permanent pain, numbness, and/or weakness of one or several areas of the body; allergic reactions; (i.e.: anaphylactic reaction); and/or death. Furthermore, the patient was informed of those risks and complications associated with the medications. These include, but are not limited to: allergic reactions (i.e.: anaphylactic or anaphylactoid reaction(s)); adrenal axis suppression; blood sugar elevation that in diabetics may result in ketoacidosis or comma; water retention that in patients with history of congestive heart failure may result in shortness of breath, pulmonary edema, and  decompensation with resultant heart failure; weight gain; swelling or edema; medication-induced neural toxicity; particulate matter embolism and blood vessel occlusion with resultant organ, and/or nervous system infarction; and/or aseptic necrosis of one or more joints. Finally, the patient was informed that Medicine is not an exact science; therefore, there is also the possibility of unforeseen or unpredictable risks and/or possible complications that may result in a catastrophic outcome. The patient indicated having understood very clearly. We have given the patient no guarantees and we have made no promises. Enough time was given to the patient to ask questions, all of which were answered to the patient's satisfaction. Mr. Loria has indicated that he wanted to continue with the procedure. Attestation: I, the ordering provider, attest that I have discussed with the patient the benefits, risks, side-effects, alternatives, likelihood of achieving goals, and potential problems during recovery for the procedure that I have provided informed consent. Date  Time: 12/11/2023  9:09 AM   Pre-Procedure Preparation:  Monitoring: As per clinic protocol. Respiration, ETCO2, SpO2, BP, heart rate and rhythm monitor placed and checked for adequate function Safety Precautions: Patient was assessed for positional comfort and pressure points before starting the procedure. Time-out: I initiated and conducted the "Time-out" before starting the procedure, as per protocol. The patient was asked to participate by confirming the accuracy of the "Time Out" information. Verification of the correct person, site, and procedure were performed and confirmed by me, the nursing staff, and the patient. "Time-out" conducted as per Joint Commission's Universal Protocol (UP.01.01.01). Time: 1025 Start Time: 1025 hrs.  Description  Narrative of Procedure:          Rationale (medical necessity): procedure needed and proper for the  diagnosis and/or treatment of the patient's medical symptoms and needs. Start Time: 1025 hrs. Safety Precautions: Aspiration looking for blood return was conducted prior to all injections. At no point did we inject any substances, as a needle was being advanced. No attempts were made at seeking any paresthesias. Safe injection practices and needle disposal techniques used. Medications properly checked for expiration dates. SDV (single dose vial) medications used. Description of procedure: Protocol guidelines were followed. The patient was assisted into a comfortable position.  The target area was identified and the area prepped in the usual manner. Skin & deeper tissues infiltrated with local anesthetic. Appropriate amount of time allowed to pass for local anesthetics to take effect. Using fluoroscopic guidance, the epidural needle was introduced through the skin, ipsilateral to the reported pain, and advanced to the target area. Posterior laminar os was contacted and the needle walked caudad, until the lamina was cleared. The ligamentum flavum was engaged and the epidural space identified using "loss-of-resistance technique" with 2-3 ml of PF-NaCl (0.9% NSS), in a 5cc dedicated LOR syringe. (See "Imaging guidance" below for use of contrast details.) Once proper needle placement was secured, and negative aspiration confirmed, the solution was injected in intermittent fashion, asking for systemic symptoms every 0.5cc. The needles were then removed and the area cleansed, making sure to leave some of the prepping solution back to take advantage of its long term bactericidal properties.  Vitals:   12/11/23 0914 12/11/23 1025 12/11/23 1030 12/11/23 1046  BP: 109/64 (!) 159/91 (!) 142/83 (!) 173/104  Pulse: 79     Resp:  18 16 16   Temp: (!) 97 F (36.1 C)     SpO2: 97% 95% 95% 97%  Weight: 200 lb (90.7 kg)     Height: 6' (1.829 m)        End Time: 1030 hrs.  Imaging Guidance (Spinal):          Type of  Imaging Technique: Fluoroscopy Guidance (Spinal) Indication(s): Fluoroscopy guidance for needle placement to enhance accuracy in procedures requiring precise needle localization for targeted delivery of medication in or near specific anatomical locations not easily accessible without such real-time imaging assistance. Exposure Time: Please see nurses notes. Contrast: Before injecting any contrast, we confirmed that the patient did not have an allergy to iodine, shellfish, or radiological contrast. Once satisfactory needle placement was completed at the desired level, radiological contrast was injected. Contrast injected under live fluoroscopy. No contrast complications. See chart for type and volume of contrast used. Fluoroscopic Guidance: I was personally present during the use of fluoroscopy. "Tunnel Vision Technique" used to obtain the best possible view of the target area. Parallax error corrected before commencing the procedure. "Direction-depth-direction" technique used to introduce the needle under continuous pulsed fluoroscopy. Once target was reached, antero-posterior, oblique, and lateral fluoroscopic projection used confirm needle placement in all planes. Images permanently stored in EMR. Interpretation: I personally interpreted the imaging intraoperatively. Adequate needle placement confirmed in multiple planes. Appropriate spread of contrast into desired area was observed. No evidence of afferent or efferent intravascular uptake. No intrathecal or subarachnoid spread observed. Permanent images saved into the patient's record.  Post-operative Assessment:  Post-procedure Vital Signs:  Pulse/HCG Rate: 7973 Temp: (!) 97 F (36.1 C) Resp: 16 BP: (!) 173/104 SpO2: 97 %  EBL: None  Complications: No immediate post-treatment complications observed by team, or reported by patient.  Note: The patient tolerated the entire procedure well. A repeat set of vitals were taken after the procedure and  the patient was kept under observation following institutional policy, for this type of procedure. Post-procedural neurological assessment was performed, showing return to baseline, prior to discharge. The patient was provided with post-procedure discharge instructions, including a section on how to identify potential problems. Should any problems arise concerning this procedure, the patient was given instructions to immediately contact us, at any time, without hesitation. In any case, we plan to contact the patient by telephone for a follow-up status report regarding this interventional procedure.  Comments:  No additional relevant information.  Plan of Care (POC)  Orders:  Orders Placed This Encounter  Procedures   DG PAIN CLINIC C-ARM 1-60 MIN NO REPORT    Intraoperative interpretation by procedural physician at [facility].    Standing Status:   Standing    Number of Occurrences:   1    Reason for exam::   Assistance in needle guidance and placement for procedures requiring needle placement in or near specific anatomical locations not easily accessible without such assistance.     Medications ordered for procedure: Meds ordered this encounter  Medications   iohexol (OMNIPAQUE) 180 MG/ML injection 10 mL    Must be Myelogram-compatible. If not available, you may substitute with a water-soluble, non-ionic, hypoallergenic, myelogram-compatible radiological contrast medium.   lidocaine (XYLOCAINE) 2 % (with pres) injection 400 mg   lactated ringers infusion   midazolam (VERSED) injection 0.5-2 mg    Make sure Flumazenil is available in the pyxis when using this medication. If oversedation occurs, administer 0.2 mg IV over 15 sec. If after 45 sec no response, administer 0.2 mg again over 1 min; may repeat at 1 min intervals; not to exceed 4 doses (1 mg)   ropivacaine (PF) 2 mg/mL (0.2%) (NAROPIN) injection 1 mL   sodium chloride flush (NS) 0.9 % injection 1 mL   dexamethasone  (DECADRON) injection 10 mg   Medications administered: We administered iohexol, lidocaine, lactated ringers, midazolam, ropivacaine (PF) 2 mg/mL (0.2%), sodium chloride flush, and dexamethasone.  See the medical record for exact dosing, route, and time of administration.  Follow-up plan:   Return in about 6 weeks (around 01/22/2024) for PPE, F2F.       Recent Visits Date Type Provider Dept  11/14/23 Office Visit Edward Jolly, MD [facility]  Showing recent visits within past 90 days and meeting all other requirements Today's Visits Date Type Provider Dept  12/11/23 Procedure visit Edward Jolly, MD [facility]  Showing today's visits and meeting all other requirements Future Appointments Date Type Provider Dept  01/22/24 Appointment Edward Jolly, MD [facility]  Showing future appointments within next 90 days and meeting all other requirements  Disposition: Discharge home  Discharge (Date  Time): 12/11/2023; 1050 hrs.   Primary Care Physician: Lynwood Dawley, MD Location: [facility] Note by: Edward Jolly, MD (TTS technology used. I apologize for any typographical errors that were not detected and corrected.) Date: 12/11/2023; Time: 11:52 AM  Disclaimer:  Medicine is not an Visual merchandiser. The only guarantee in medicine is that nothing is guaranteed. It is important to note that the decision to proceed with this intervention was based on the information collected from the patient. The Data and conclusions were drawn from the patient's questionnaire, the interview, and the physical examination. Because the information was provided in large part by the patient, it cannot be guaranteed that it has not been purposely or unconsciously manipulated. Every effort has been made to obtain as much relevant data as possible for this evaluation. It is important to note that the conclusions that lead to this procedure are derived in  large part from the available data. Always take into account that the treatment will also be dependent on availability of resources and existing treatment guidelines, considered by other Pain Management Practitioners as being common knowledge and practice, at the time of the intervention. For Medico-Legal purposes, it is also important to point out that variation in procedural techniques and  pharmacological choices are the acceptable norm. The indications, contraindications, technique, and results of the above procedure should only be interpreted and judged by a Board-Certified Interventional Pain Specialist with extensive familiarity and expertise in the same exact procedure and technique.

## 2023-12-12 ENCOUNTER — Telehealth: Payer: Self-pay

## 2023-12-12 ENCOUNTER — Other Ambulatory Visit: Payer: Self-pay | Admitting: Surgery

## 2023-12-12 NOTE — Telephone Encounter (Signed)
Post procedure follow up..  Patient states he is doing good 

## 2023-12-13 ENCOUNTER — Other Ambulatory Visit: Payer: Self-pay

## 2023-12-13 ENCOUNTER — Encounter
Admission: RE | Admit: 2023-12-13 | Discharge: 2023-12-13 | Disposition: A | Discharge status: Home / Self Care | Payer: Commercial Managed Care - PPO | Source: Ambulatory Visit | Attending: Surgery | Admitting: Surgery

## 2023-12-13 ENCOUNTER — Ambulatory Visit: Payer: Commercial Managed Care - PPO | Attending: Cardiology | Admitting: Cardiology

## 2023-12-13 ENCOUNTER — Encounter: Payer: Self-pay | Admitting: Cardiology

## 2023-12-13 VITALS — BP 110/70 | HR 80 | Ht 72.0 in | Wt 220.0 lb

## 2023-12-13 VITALS — Ht 72.0 in | Wt 205.0 lb

## 2023-12-13 DIAGNOSIS — Z951 Presence of aortocoronary bypass graft: Secondary | ICD-10-CM

## 2023-12-13 DIAGNOSIS — I10 Essential (primary) hypertension: Secondary | ICD-10-CM

## 2023-12-13 DIAGNOSIS — G4733 Obstructive sleep apnea (adult) (pediatric): Secondary | ICD-10-CM

## 2023-12-13 DIAGNOSIS — E782 Mixed hyperlipidemia: Secondary | ICD-10-CM

## 2023-12-13 DIAGNOSIS — E113513 Type 2 diabetes mellitus with proliferative diabetic retinopathy with macular edema, bilateral: Secondary | ICD-10-CM | POA: Diagnosis not present

## 2023-12-13 DIAGNOSIS — E113511 Type 2 diabetes mellitus with proliferative diabetic retinopathy with macular edema, right eye: Secondary | ICD-10-CM

## 2023-12-13 DIAGNOSIS — H35373 Puckering of macula, bilateral: Secondary | ICD-10-CM | POA: Diagnosis not present

## 2023-12-13 HISTORY — DX: Other specified postprocedural states: Z98.890

## 2023-12-13 HISTORY — DX: Nausea with vomiting, unspecified: R11.2

## 2023-12-13 MED ORDER — REPATHA SURECLICK 140 MG/ML ~~LOC~~ SOAJ
140.0000 mg | SUBCUTANEOUS | 3 refills | Status: AC
  Filled 2023-12-13 – 2024-02-04 (×4): qty 2, 28d supply, fill #0

## 2023-12-13 NOTE — Patient Instructions (Signed)
Medication Instructions:   Your physician recommends that you continue on your current medications as directed. Please refer to the Current Medication list given to you today.  *If you need a refill on your cardiac medications before your next appointment, please call your pharmacy*   Lab Work:  Your provider would like for you to return in 3 months - before your follow up appt  to have the following labs drawn: LIPID.   Please go to [facility] [address] ([facility] [address], [place] [address] You do not need an appointment.  They are open from 7:30 am-4 pm.  Lunch from 1:00 pm- 2:00 pm You WILL need to be fasting.  If you have labs (blood work) drawn today and your tests are completely normal, you will receive your results only by: MyChart Message (if you have MyChart) OR A paper copy in the mail If you have any lab test that is abnormal or we need to change your treatment, we will call you to review the results.   Testing/Procedures:  Your physician has requested that you have an echocardiogram. Echocardiography is a painless test that uses sound waves to create images of your heart. It provides your doctor with information about the size and shape of your heart and how well your heart's chambers and valves are working. This procedure takes approximately one hour. There are no restrictions for this procedure. Please do NOT wear cologne, perfume, aftershave, or lotions (deodorant is allowed). Please arrive 15 minutes prior to your appointment time.  Please note: We ask at that you not bring children with you during ultrasound (echo/ vascular) testing. Due to room size and safety concerns, children are not allowed in the ultrasound rooms during exams. Our front office staff cannot provide observation of children in our lobby area while testing is being conducted. An adult accompanying a patient to their appointment will only be allowed in the ultrasound room  at the discretion of the ultrasound technician under special circumstances. We apologize for any inconvenience.    Follow-Up: At [facility], you and your health needs are our priority.  As part of our continuing mission to provide you with exceptional heart care, we have created designated Provider Care Teams.  These Care Teams include your primary Cardiologist (physician) and Advanced Practice Providers (APPs -  Physician Assistants and Nurse Practitioners) who all work together to provide you with the care you need, when you need it.  We recommend signing up for the patient portal called "MyChart".  Sign up information is provided on this After Visit Summary.  MyChart is used to connect with patients for Virtual Visits (Telemedicine).  Patients are able to view lab/test results, encounter notes, upcoming appointments, etc.  Non-urgent messages can be sent to your provider as well.   To learn more about what you can do with MyChart, go to [email or web address].    Your next appointment:   3 month(s)  Provider:   You may see Debbe Odea, MD or one of the following Advanced Practice Providers on your designated Care Team:   Nicolasa Ducking, NP Eula Listen, PA-C Cadence Fransico Michael, PA-C Charlsie Quest, NP Carlos Levering, NP

## 2023-12-13 NOTE — Progress Notes (Signed)
Cardiology Office Note:    Date:  12/13/2023   ID:  [name], DOB 18-Nov-1956, MRN [id number]  PCP:  Lynwood Dawley, MD   [facility] Providers Cardiologist:  Debbe Odea, MD     Referring MD: Lynwood Dawley, MD   Chief Complaint  Patient presents with   New Patient (Initial Visit)    Self referral for cardiac care with cardiac history at [facility] and [facility].      History of Present Illness:    Robert Fernandez is a 67 y.o. male with a hx of CAD/CABG x2 (LIMA-LAD, SVG-LCx in 05/2016), hypertension, hyperlipidemia, diabetes, ED who presents to establish care.  Previously seen by [facility] cardiology and [facility] health from a cardiac perspective.  Overall doing okay, had an episode of chest pain 1 to 2 months ago.  Has not had any since.  Has right shoulder pain, surgery being planned next week.  He takes verapamil for migraines, Aldactone due to liver disease and abdominal distention.  Not tolerant to statins, on Repatha, states missing several doses of Repatha.  Was previously on losartan but this was stopped due to low blood pressures.  Takes Coreg for both BP and antianginal benefit.  Echo 2019 EF 65%  Past Medical History:  Diagnosis Date   Arthritis    Asthma    Cirrhosis of liver (HCC)    Coronary artery disease    Coronary artery disease involving native heart without angina pectoris    Diabetes mellitus without complication (HCC)    Diabetic retinopathy of both eyes (HCC)    GERD (gastroesophageal reflux disease)    Hepatosplenomegaly    History of deep venous thrombosis    Hyperlipidemia    Hypertension    Left adrenal mass (HCC)    Meningioma (HCC)    Migraines    Neovascular glaucoma, indeterminate stage, left    Neuropathy    Obstructive sleep apnea    Osteoarthritis    Osteopenia    PONV (postoperative nausea and vomiting)     Past Surgical History:  Procedure Laterality Date   ACHILLES TENDON  REPAIR     BLEPHAROPLASTY UPPER EYELID      CARDIAC CATHETERIZATION  05/24/16   CORONARY ANGIOPLASTY     CORONARY ARTERY BYPASS GRAFT  2017   FINGER AMPUTATION Left    INSERTION AQUEOUS SHUNT      KNEE ARTHROSCOPY Left    LAPAROSCOPIC CHOLECYSTECTOMY     LENS EYE SURGERY      LUMBAR DISC SURGERY     l4-5   ROTATOR CUFF REPAIR Left    TIBIA FRACTURE SURGERY      Current Medications: Current Meds  Medication Sig   acetaminophen (TYLENOL) 500 MG tablet Take 500 mg by mouth every 6 (six) hours as needed for moderate pain (pain score 4-6).   albuterol (VENTOLIN HFA) 108 (90 Base) MCG/ACT inhaler Inhale 2 puffs into the lungs every 4 (four) hours as needed.   aspirin EC 81 MG tablet Take 81 mg by mouth daily.   brimonidine (ALPHAGAN) 0.2 % ophthalmic solution Place 1 drop into both eyes 2 (two) times daily.   brimonidine (ALPHAGAN) 0.2 % ophthalmic solution Place 1 drop into both eyes 2 (two) times daily.   carvedilol (COREG) 6.25 MG tablet Take 1 tablet (6.25 mg total) by mouth 2 (two) times daily with meals   carvedilol (COREG) 6.25 MG tablet Take 1 tablet (6.25 mg total) by mouth 2 (two) times  daily with a meal.   Continuous Blood Gluc Receiver (FREESTYLE LIBRE 14 DAY READER) DEVI Use as directed   Continuous Blood Gluc Sensor (FREESTYLE LIBRE 14 DAY SENSOR) MISC Inject 1 kit subcutaneously every 14 (fourteen) days   Continuous Glucose Sensor (FREESTYLE LIBRE 14 DAY SENSOR) MISC for glucose monitoring   dorzolamide-timolol (COSOPT) 2-0.5 % ophthalmic solution Place 1 drop into the right eye 2 (two) times daily   dorzolamide-timolol (COSOPT) 2-0.5 % ophthalmic solution Place 1 drop into both eyes 2 (two) times daily. (Patient taking differently: Place 1 drop into the left eye 2 (two) times daily.)   empagliflozin (JARDIANCE) 25 MG TABS tablet Take 1 tablet (25 mg total) by mouth daily with breakfast.   furosemide (LASIX) 20 MG tablet Take 1 tablet (20 mg total) by mouth daily.   gabapentin  (NEURONTIN) 300 MG capsule Take 1 capsule (300 mg total) by mouth 2 (two) times daily AND 3 capsules (900 mg total) at bedtime.   gabapentin (NEURONTIN) 300 MG capsule Take 1 capsule (300 mg total) by mouth 2 (two) times daily AND 3 capsules (900 mg total) at bedtime.   Insulin Degludec FlexTouch 100 UNIT/ML SOPN Inject 20 Units into the skin at bedtime.   Insulin Pen Needle (UNIFINE PENTIPS PLUS) 31G X 6 MM MISC use as directed   meclizine (ANTIVERT) 25 MG tablet Take 1 tablet (25 mg total) by mouth 3 (three) times daily as needed for Dizziness   meclizine (ANTIVERT) 25 MG tablet Take 1 tablet (25 mg total) by mouth 3 (three) times daily as needed for dizziness   methocarbamol (ROBAXIN) 750 MG tablet Take 1 tablet (750 mg total) by mouth 2 (two) times daily as needed.   omeprazole (PRILOSEC) 40 MG capsule Take 1 capsule (40 mg total) by mouth 2 (two) times daily  before meals.   pioglitazone (ACTOS) 30 MG tablet Take 1 tablet (30 mg total) by mouth daily.   Semaglutide, 1 MG/DOSE, (OZEMPIC, 1 MG/DOSE,) 4 MG/3ML SOPN Inject 1 mg into the skin once a week.   spironolactone (ALDACTONE) 50 MG tablet Take 1 tablet (50 mg total) by mouth daily.   SUMAtriptan (IMITREX) 50 MG tablet Take 1 tablet (50 mg total) by mouth as directed for Migraine May take a second dose after 2 hours if needed.   verapamil (VERELAN) 360 MG 24 hr capsule Take 1 capsule (360 mg total) by mouth at bedtime.   [DISCONTINUED] Evolocumab (REPATHA SURECLICK) 140 MG/ML SOAJ Inject 140 mg under the skin every 14 (fourteen) days.     Allergies:   Rosuvastatin, Augmentin [amoxicillin-pot clavulanate], Codeine, and Dilaudid [hydromorphone hcl]   Social History   Socioeconomic History   Marital status: Married    Spouse name: Not on file   Number of children: Not on file   Years of education: Not on file   Highest education level: Not on file  Occupational History   Not on file  Tobacco Use   Smoking status: Former    Types:  Cigarettes   Smokeless tobacco: Never  Vaping Use   Vaping status: Never Used  Substance and Sexual Activity   Alcohol use: Never   Drug use: Not Currently   Sexual activity: Not on file  Other Topics Concern   Not on file  Social History Narrative   Not on file   Social Drivers of Health   Financial Resource Strain: Patient Declined (09/29/2020)   Received from [facility], [facility]  Overall Financial Resource Strain (CARDIA)    Difficulty of Paying Living Expenses: Patient declined  Food Insecurity: Patient Declined (09/29/2020)   Received from [facility], [facility]   Hunger Vital Sign    Worried About Running Out of Food in the Last Year: Patient declined    Ran Out of Food in the Last Year: Patient declined  Transportation Needs: Patient Declined (09/29/2020)   Received from [facility], [facility]   [facility] - Transportation    In the past 12 months, has lack of transportation kept you from medical appointments or from getting medications?: Patient declined    Lack of Transportation (Non-Medical): Patient declined  Physical Activity: Insufficiently Active (09/29/2020)   Received from [facility], [facility]   Exercise Vital Sign    Days of Exercise per Week: 3 days    Minutes of Exercise per Session: 30 min  Stress: No Stress Concern Present (09/29/2020)   Received from [facility], [facility]   [employer] [facility] Occupational [facility] - Occupational Stress Questionnaire    Feeling of Stress : Only a little  Social Connections: Unknown (09/29/2020)   Received from [facility], [facility]   Social Connection and Isolation Panel [NHANES]    Frequency of Communication with Friends and Family: More than three times a week    Frequency of Social Gatherings  with Friends and Family: Patient declined    Attends Religious Services: Never    Database administrator or Organizations: Patient declined    Attends Engineer, structural: Patient declined    Marital Status: Married     Family History: The patient's family history includes Breast cancer in his mother and sister; Heart attack in his father; Heart disease in his maternal grandmother; Hypertension in his father.  ROS:   Please see the history of present illness.     All other systems reviewed and are negative.  EKGs/Labs/Other Studies Reviewed:    The following studies were reviewed today:  EKG Interpretation Date/Time:  Friday December 13 2023 14:19:12 EST Ventricular Rate:  80 PR Interval:  144 QRS Duration:  96 QT Interval:  432 QTC Calculation: 498 R Axis:   35  Text Interpretation: Normal sinus rhythm Nonspecific T wave abnormality Confirmed by Debbe Odea ([id number]) on 12/13/2023 2:22:24 PM    Recent Labs: No results found for requested labs within last 365 days.  Recent Lipid Panel No results found for: "CHOL", "TRIG", "HDL", "CHOLHDL", "VLDL", "LDLCALC", "LDLDIRECT"  Outside lipid panel 10/04/2023 total cholesterol 228, LDL 133, HDL 54, triglycerides 205.  Risk Assessment/Calculations:             Physical Exam:    VS:  BP 110/70 (BP Location: Left Arm, Patient Position: Sitting)   Pulse 80   Ht 6' (1.829 m)   Wt 220 lb (99.8 kg)   SpO2 97%   BMI 29.84 kg/m     Wt Readings from Last 3 Encounters:  12/13/23 205 lb (93 kg)  12/13/23 220 lb (99.8 kg)  12/11/23 200 lb (90.7 kg)     GEN:  Well nourished, well developed in no acute distress HEENT: Normal NECK: No JVD; No carotid bruits CARDIAC: RRR, no murmurs, rubs, gallops RESPIRATORY:  Clear to auscultation without rales, wheezing or rhonchi  ABDOMEN: Soft, non-tender, non-distended MUSCULOSKELETAL:  No edema; No deformity  SKIN: Warm and dry NEUROLOGIC:  Alert and oriented x  3 PSYCHIATRIC:  Normal affect   ASSESSMENT:    1. S/P CABG x 2   2. Primary hypertension   3. Mixed hyperlipidemia    PLAN:    In order of problems listed above:  CAD s/p CABG times 02/13/2016.  Currently denies chest pain.  Get echocardiogram, continue aspirin 81 mg daily, advised to take Repatha as prescribed.  If further episodes of chest pain, consider PET stress testing. Hypertension, BP controlled.  Continue Coreg 6.25 mg daily.  Takes Aldactone for liver disease, verapamil for migraines. Hyperlipidemia, advised to take Repatha as prescribed.  Repeat lipid panel in 3 months.  If elevated, plan to add Zetia.  Follow-up after echocardiogram      Medication Adjustments/Labs and Tests Ordered: Current medicines are reviewed at length with the patient today.  Concerns regarding medicines are outlined above.  Orders Placed This Encounter  Procedures   Lipid panel   EKG 12-Lead   ECHOCARDIOGRAM COMPLETE   Meds ordered this encounter  Medications   Evolocumab (REPATHA SURECLICK) 140 MG/ML SOAJ    Sig: Inject 140 mg under the skin every 14 (fourteen) days.    Dispense:  2 mL    Refill:  3    Patient Instructions  Medication Instructions:   Your physician recommends that you continue on your current medications as directed. Please refer to the Current Medication list given to you today.  *If you need a refill on your cardiac medications before your next appointment, please call your pharmacy*   Lab Work:  Your provider would like for you to return in 3 months - before your follow up appt  to have the following labs drawn: LIPID.   Please go to [facility] [address] ([facility] [address], [place] [address] You do not need an appointment.  They are open from 7:30 am-4 pm.  Lunch from 1:00 pm- 2:00 pm You WILL need to be fasting.  If you have labs (blood work) drawn today and your tests are completely normal, you will receive your results only  by: MyChart Message (if you have MyChart) OR A paper copy in the mail If you have any lab test that is abnormal or we need to change your treatment, we will call you to review the results.   Testing/Procedures:  Your physician has requested that you have an echocardiogram. Echocardiography is a painless test that uses sound waves to create images of your heart. It provides your doctor with information about the size and shape of your heart and how well your heart's chambers and valves are working. This procedure takes approximately one hour. There are no restrictions for this procedure. Please do NOT wear cologne, perfume, aftershave, or lotions (deodorant is allowed). Please arrive 15 minutes prior to your appointment time.  Please note: We ask at that you not bring children with you during ultrasound (echo/ vascular) testing. Due to room size and safety concerns, children are not allowed in the ultrasound rooms during exams. Our front office staff cannot provide observation of children in our lobby area while testing is being conducted. An adult accompanying a patient to their appointment will only be allowed in the ultrasound room at the discretion of the ultrasound technician under special circumstances. We apologize for any inconvenience.    Follow-Up: At [facility], you and your health needs are our priority.  As part of our continuing mission to provide you with exceptional heart care, we have created  designated Provider Care Teams.  These Care Teams include your primary Cardiologist (physician) and Advanced Practice Providers (APPs -  Physician Assistants and Nurse Practitioners) who all work together to provide you with the care you need, when you need it.  We recommend signing up for the patient portal called "MyChart".  Sign up information is provided on this After Visit Summary.  MyChart is used to connect with patients for Virtual Visits (Telemedicine).  Patients are able  to view lab/test results, encounter notes, upcoming appointments, etc.  Non-urgent messages can be sent to your provider as well.   To learn more about what you can do with MyChart, go to [email or web address].    Your next appointment:   3 month(s)  Provider:   You may see Debbe Odea, MD or one of the following Advanced Practice Providers on your designated Care Team:   Nicolasa Ducking, NP Eula Listen, PA-C Cadence Fransico Michael, PA-C Charlsie Quest, NP Carlos Levering, NP   Signed, Debbe Odea, MD  12/13/2023 3:22 PM    [facility]

## 2023-12-13 NOTE — Patient Instructions (Addendum)
Your procedure is scheduled on: Tuesday 12/17/23 To find out your arrival time, please call [phone] between 1PM - 3PM on:   Monday 12/16/23 Report to the Registration Desk on the 1st floor of the [facility]. FREE Valet parking is available.  If your arrival time is 6:00 am, do not arrive before that time as the [facility] entrance doors do not open until 6:00 am.  REMEMBER: Instructions that are not followed completely may result in serious medical risk, up to and including death; or upon the discretion of your surgeon and anesthesiologist your surgery may need to be rescheduled.  Do not eat food after midnight the night before surgery.  No gum chewing or hard candies.  You may however, drink CLEAR liquids up to 2 hours before you are scheduled to arrive for your surgery. Do not drink anything within 2 hours of your scheduled arrival time.  Clear liquids include: - water   Type 1 and Type 2 diabetics should only drink water.  In addition, your doctor has ordered for you to drink the provided:   Gatorade G2 Drinking this carbohydrate drink up to two hours before surgery helps to reduce insulin resistance and improve patient outcomes. Please complete drinking 2 hours before scheduled arrival time.  One week prior to surgery: Stop Anti-inflammatories (NSAIDS) such as Advil, Aleve, Ibuprofen, Motrin, Naproxen, Naprosyn and Aspirin based products such as Excedrin, Goody's Powder, BC Powder. You may however, continue to take Tylenol if needed for pain up until the day of surgery.  Stop ANY OVER THE COUNTER supplements and vitamins TODAY until after surgery.  Continue taking all prescribed medications with the exception of the following:Jardiance, today 12/13/23 will be last dose until after surgery. Cut your bedtime dose of insulin in half to 10 units the night before  surgery 12/16/23  TAKE ONLY THESE MEDICATIONS THE MORNING OF SURGERY WITH A SIP OF WATER:  carvedilol (COREG)  6.25 MG tablet  gabapentin (NEURONTIN) 300 MG capsule  omeprazole (PRILOSEC) 40 MG capsule Antacid (take one the night before and one on the morning of surgery - helps to prevent nausea after surgery.)  Use inhalers on the day of surgery and bring to the hospital.  No Alcohol for 24 hours before or after surgery.  No Smoking including e-cigarettes for 24 hours before surgery.  No chewable tobacco products for at least 6 hours before surgery.  No nicotine patches on the day of surgery.  Do not use any "recreational" drugs for at least a week (preferably 2 weeks) before your surgery.  Please be advised that the combination of cocaine and anesthesia may have negative outcomes, up to and including death. If you test positive for cocaine, your surgery will be cancelled.  On the morning of surgery brush your teeth with toothpaste and water, you may rinse your mouth with mouthwash if you wish. Do not swallow any toothpaste or mouthwash.  Use CHG Soap or wipes as directed on instruction sheet.  Do not wear lotions, powders, or perfumes. NO DEODORANT  Do not shave body hair from the neck down 48 hours before surgery.  Wear clean comfortable clothing (specific to your surgery type) to the hospital.  Do not wear jewelry, make-up, hairpins, clips or nail polish.  For welded (permanent) jewelry: bracelets, anklets, waist bands, etc.  Please have this removed prior to surgery.  If it is not removed, there is a chance that hospital personnel will need to cut it off on the day of  surgery. Contact lenses, hearing aids and dentures may not be worn into surgery.  Bring your C-PAP to the hospital in case you may have to spend the night. (Not using at this time)  Do not bring valuables to the hospital. [facility] is not responsible for any missing/lost belongings or valuables.   Notify your doctor if there is any change in your medical condition (cold, fever, infection).  If you are being  discharged the day of surgery, you will not be allowed to drive home. You will need a responsible individual to drive you home and stay with you for 24 hours after surgery.   If you are taking public transportation, you will need to have a responsible individual with you.  If you are being admitted to the hospital overnight, leave your suitcase in the car. After surgery it may be brought to your room.  In case of increased patient census, it may be necessary for you, the patient, to continue your postoperative care in the Same Day Surgery department.  After surgery, you can help prevent lung complications by doing breathing exercises.  Take deep breaths and cough every 1-2 hours. Your doctor may order a device called an Incentive Spirometer to help you take deep breaths. When coughing or sneezing, hold a pillow firmly against your incision with both hands. This is called "splinting." Doing this helps protect your incision. It also decreases belly discomfort.  Surgery Visitation Policy:  Patients undergoing a surgery or procedure may have two family members or support persons with them as long as the person is not COVID-19 positive or experiencing its symptoms.   Inpatient Visitation:    Visiting hours are 7 a.m. to 8 p.m. Up to four visitors are allowed at one time in a patient room. The visitors may rotate out with other people during the day. One designated support person (adult) may remain overnight.  Please call the Pre-admissions Testing Dept. at [phone] if you have any questions about these instructions.     Preparing for Surgery with CHLORHEXIDINE GLUCONATE (CHG) Soap  Chlorhexidine Gluconate (CHG) Soap  o An antiseptic cleaner that kills germs and bonds with the skin to continue killing germs even after washing  o Used for showering the night before surgery and morning of surgery  Before surgery, you can play an important role by reducing the number of germs on your  skin.  CHG (Chlorhexidine gluconate) soap is an antiseptic cleanser which kills germs and bonds with the skin to continue killing germs even after washing.  Please do not use if you have an allergy to CHG or antibacterial soaps. If your skin becomes reddened/irritated stop using the CHG.  1. Shower the NIGHT BEFORE SURGERY and the MORNING OF SURGERY with CHG soap.  2. If you choose to wash your hair, wash your hair first as usual with your normal shampoo.  3. After shampooing, rinse your hair and body thoroughly to remove the shampoo.  4. Use CHG as you would any other liquid soap. You can apply CHG directly to the skin and wash gently with a scrungie or a clean washcloth.  5. Apply the CHG soap to your body only from the neck down. Do not use on open wounds or open sores. Avoid contact with your eyes, ears, mouth, and genitals (private parts). Wash face and genitals (private parts) with your normal soap.  6. Wash thoroughly, paying special attention to the area where your surgery will be performed.  7. Thoroughly  rinse your body with warm water.  8. Do not shower/wash with your normal soap after using and rinsing off the CHG soap.  9. Pat yourself dry with a clean towel.  10. Wear clean pajamas to bed the night before surgery.  12. Place clean sheets on your bed the night of your first shower and do not sleep with pets.  13. Shower again with the CHG soap on the day of surgery prior to arriving at the hospital.  14. Do not apply any deodorants/lotions/powders.  15. Please wear clean clothes to the hospital.

## 2023-12-17 ENCOUNTER — Other Ambulatory Visit: Payer: Self-pay

## 2023-12-17 ENCOUNTER — Encounter
Admission: RE | Disposition: A | Discharge status: Home / Self Care | Payer: Self-pay | Source: Home / Self Care | Attending: Surgery

## 2023-12-17 ENCOUNTER — Ambulatory Visit: Payer: Self-pay | Admitting: Anesthesiology

## 2023-12-17 ENCOUNTER — Encounter: Payer: Self-pay | Admitting: Surgery

## 2023-12-17 ENCOUNTER — Ambulatory Visit: Payer: Commercial Managed Care - PPO

## 2023-12-17 ENCOUNTER — Encounter: Payer: Self-pay | Admitting: Cardiology

## 2023-12-17 ENCOUNTER — Ambulatory Visit
Admission: RE | Admit: 2023-12-17 | Discharge: 2023-12-17 | Disposition: A | Discharge status: Home / Self Care | Payer: Commercial Managed Care - PPO | Attending: Surgery | Admitting: Surgery

## 2023-12-17 DIAGNOSIS — M75111 Incomplete rotator cuff tear or rupture of right shoulder, not specified as traumatic: Secondary | ICD-10-CM | POA: Diagnosis not present

## 2023-12-17 DIAGNOSIS — M7581 Other shoulder lesions, right shoulder: Secondary | ICD-10-CM | POA: Insufficient documentation

## 2023-12-17 DIAGNOSIS — E113511 Type 2 diabetes mellitus with proliferative diabetic retinopathy with macular edema, right eye: Secondary | ICD-10-CM

## 2023-12-17 DIAGNOSIS — K219 Gastro-esophageal reflux disease without esophagitis: Secondary | ICD-10-CM | POA: Diagnosis not present

## 2023-12-17 DIAGNOSIS — M7521 Bicipital tendinitis, right shoulder: Secondary | ICD-10-CM | POA: Diagnosis not present

## 2023-12-17 DIAGNOSIS — M7501 Adhesive capsulitis of right shoulder: Secondary | ICD-10-CM | POA: Insufficient documentation

## 2023-12-17 DIAGNOSIS — M25811 Other specified joint disorders, right shoulder: Secondary | ICD-10-CM | POA: Diagnosis not present

## 2023-12-17 DIAGNOSIS — M19011 Primary osteoarthritis, right shoulder: Secondary | ICD-10-CM | POA: Insufficient documentation

## 2023-12-17 DIAGNOSIS — Z794 Long term (current) use of insulin: Secondary | ICD-10-CM | POA: Insufficient documentation

## 2023-12-17 DIAGNOSIS — G4733 Obstructive sleep apnea (adult) (pediatric): Secondary | ICD-10-CM

## 2023-12-17 DIAGNOSIS — I10 Essential (primary) hypertension: Secondary | ICD-10-CM | POA: Diagnosis not present

## 2023-12-17 DIAGNOSIS — E119 Type 2 diabetes mellitus without complications: Secondary | ICD-10-CM | POA: Insufficient documentation

## 2023-12-17 DIAGNOSIS — M75121 Complete rotator cuff tear or rupture of right shoulder, not specified as traumatic: Secondary | ICD-10-CM | POA: Diagnosis not present

## 2023-12-17 DIAGNOSIS — I2510 Atherosclerotic heart disease of native coronary artery without angina pectoris: Secondary | ICD-10-CM | POA: Diagnosis not present

## 2023-12-17 DIAGNOSIS — I252 Old myocardial infarction: Secondary | ICD-10-CM | POA: Diagnosis not present

## 2023-12-17 DIAGNOSIS — Z7985 Long-term (current) use of injectable non-insulin antidiabetic drugs: Secondary | ICD-10-CM | POA: Diagnosis not present

## 2023-12-17 DIAGNOSIS — M24111 Other articular cartilage disorders, right shoulder: Secondary | ICD-10-CM | POA: Diagnosis not present

## 2023-12-17 DIAGNOSIS — Z951 Presence of aortocoronary bypass graft: Secondary | ICD-10-CM | POA: Insufficient documentation

## 2023-12-17 DIAGNOSIS — Z87891 Personal history of nicotine dependence: Secondary | ICD-10-CM | POA: Insufficient documentation

## 2023-12-17 DIAGNOSIS — Z7984 Long term (current) use of oral hypoglycemic drugs: Secondary | ICD-10-CM | POA: Diagnosis not present

## 2023-12-17 HISTORY — PX: SHOULDER ARTHROSCOPY WITH SUBACROMIAL DECOMPRESSION, ROTATOR CUFF REPAIR AND BICEP TENDON REPAIR: SHX5687

## 2023-12-17 LAB — CBC
HCT: 41.5 % (ref 39.0–52.0)
Hemoglobin: 14.5 g/dL (ref 13.0–17.0)
MCH: 31.7 pg (ref 26.0–34.0)
MCHC: 34.9 g/dL (ref 30.0–36.0)
MCV: 90.6 fL (ref 80.0–100.0)
Platelets: 168 10*3/uL (ref 150–400)
RBC: 4.58 MIL/uL (ref 4.22–5.81)
RDW: 13.3 % (ref 11.5–15.5)
WBC: 7.3 10*3/uL (ref 4.0–10.5)
nRBC: 0 % (ref 0.0–0.2)

## 2023-12-17 LAB — GLUCOSE, CAPILLARY
Glucose-Capillary: 156 mg/dL — ABNORMAL HIGH (ref 70–99)
Glucose-Capillary: 149 mg/dL — ABNORMAL HIGH (ref 70–99)

## 2023-12-17 SURGERY — SHOULDER ARTHROSCOPY WITH SUBACROMIAL DECOMPRESSION, ROTATOR CUFF REPAIR AND BICEP TENDON REPAIR
Anesthesia: General | Site: Shoulder | Laterality: Right

## 2023-12-17 MED ORDER — LIDOCAINE HCL (PF) 2 % IJ SOLN
INTRAMUSCULAR | Status: AC
  Filled 2023-12-17: qty 5

## 2023-12-17 MED ORDER — DIPHENHYDRAMINE HCL 50 MG/ML IJ SOLN
INTRAMUSCULAR | Status: AC
  Filled 2023-12-17: qty 1

## 2023-12-17 MED ORDER — PROPOFOL 10 MG/ML IV BOLUS
INTRAVENOUS | Status: AC
  Filled 2023-12-17: qty 20

## 2023-12-17 MED ORDER — FENTANYL CITRATE (PF) 100 MCG/2ML IJ SOLN
INTRAMUSCULAR | Status: DC | PRN
  Administered 2023-12-17: 50 ug via INTRAVENOUS

## 2023-12-17 MED ORDER — ROCURONIUM BROMIDE 10 MG/ML (PF) SYRINGE
PREFILLED_SYRINGE | INTRAVENOUS | Status: AC
  Filled 2023-12-17: qty 10

## 2023-12-17 MED ORDER — LACTATED RINGERS IV SOLN
INTRAVENOUS | Status: DC | PRN
  Administered 2023-12-17: 3001 mL

## 2023-12-17 MED ORDER — PHENYLEPHRINE 80 MCG/ML (10ML) SYRINGE FOR IV PUSH (FOR BLOOD PRESSURE SUPPORT)
PREFILLED_SYRINGE | INTRAVENOUS | Status: DC | PRN
  Administered 2023-12-17: 120 ug via INTRAVENOUS
  Administered 2023-12-17 (×3): 80 ug via INTRAVENOUS

## 2023-12-17 MED ORDER — OXYCODONE HCL 5 MG PO TABS: 5.0000 mg | ORAL_TABLET | Freq: Once | ORAL | Status: DC | PRN

## 2023-12-17 MED ORDER — CHLORHEXIDINE GLUCONATE 0.12 % MT SOLN
15.0000 mL | Freq: Once | OROMUCOSAL | Status: AC
  Administered 2023-12-17: 15 mL via OROMUCOSAL

## 2023-12-17 MED ORDER — SUGAMMADEX SODIUM 200 MG/2ML IV SOLN
INTRAVENOUS | Status: DC | PRN
  Administered 2023-12-17: 200 mg via INTRAVENOUS

## 2023-12-17 MED ORDER — METOCLOPRAMIDE HCL 10 MG PO TABS
5.0000 mg | ORAL_TABLET | Freq: Three times a day (TID) | ORAL | Status: DC | PRN
Start: 2023-12-17 — End: 2023-12-17

## 2023-12-17 MED ORDER — BUPIVACAINE HCL (PF) 0.5 % IJ SOLN
INTRAMUSCULAR | Status: DC | PRN
  Administered 2023-12-17: 7 mL via PERINEURAL
  Administered 2023-12-17: 3 mL via PERINEURAL

## 2023-12-17 MED ORDER — HYDROCODONE-ACETAMINOPHEN 5-325 MG PO TABS: 1.0000 | ORAL_TABLET | ORAL | Status: DC | PRN

## 2023-12-17 MED ORDER — HYDROCODONE-ACETAMINOPHEN 5-325 MG PO TABS
1.0000 | ORAL_TABLET | Freq: Four times a day (QID) | ORAL | 0 refills | Status: DC | PRN
Start: 2023-12-17 — End: 2024-04-21

## 2023-12-17 MED ORDER — EPINEPHRINE PF 1 MG/ML IJ SOLN
INTRAMUSCULAR | Status: AC
  Filled 2023-12-17: qty 1

## 2023-12-17 MED ORDER — CHLORHEXIDINE GLUCONATE 0.12 % MT SOLN
OROMUCOSAL | Status: AC
  Filled 2023-12-17: qty 15

## 2023-12-17 MED ORDER — PROPOFOL 10 MG/ML IV BOLUS
INTRAVENOUS | Status: DC | PRN
  Administered 2023-12-17: 100 mg via INTRAVENOUS

## 2023-12-17 MED ORDER — PHENYLEPHRINE HCL-NACL 20-0.9 MG/250ML-% IV SOLN
INTRAVENOUS | Status: DC | PRN
Start: 2023-12-17 — End: 2023-12-17
  Administered 2023-12-17: 25 ug/min via INTRAVENOUS

## 2023-12-17 MED ORDER — ORAL CARE MOUTH RINSE
15.0000 mL | Freq: Once | OROMUCOSAL | Status: AC
Start: 2023-12-17 — End: 2023-12-17

## 2023-12-17 MED ORDER — DIPHENHYDRAMINE HCL 50 MG/ML IJ SOLN
INTRAMUSCULAR | Status: DC | PRN
  Administered 2023-12-17: 12.5 mg via INTRAVENOUS

## 2023-12-17 MED ORDER — KETOROLAC TROMETHAMINE 15 MG/ML IJ SOLN
INTRAMUSCULAR | Status: AC
  Filled 2023-12-17: qty 1

## 2023-12-17 MED ORDER — BUPIVACAINE-EPINEPHRINE 0.5% -1:200000 IJ SOLN
INTRAMUSCULAR | Status: DC | PRN
  Administered 2023-12-17: 30 mL

## 2023-12-17 MED ORDER — FENTANYL CITRATE PF 50 MCG/ML IJ SOSY
PREFILLED_SYRINGE | INTRAMUSCULAR | Status: AC
  Filled 2023-12-17: qty 1

## 2023-12-17 MED ORDER — KETOROLAC TROMETHAMINE 15 MG/ML IJ SOLN
15.0000 mg | Freq: Once | INTRAMUSCULAR | Status: AC
  Administered 2023-12-17: 15 mg via INTRAVENOUS

## 2023-12-17 MED ORDER — ONDANSETRON HCL 4 MG PO TABS: 4.0000 mg | ORAL_TABLET | Freq: Four times a day (QID) | ORAL | Status: DC | PRN

## 2023-12-17 MED ORDER — ACETAMINOPHEN 325 MG PO TABS: 325.0000 mg | ORAL_TABLET | Freq: Four times a day (QID) | ORAL | Status: DC | PRN

## 2023-12-17 MED ORDER — CEFAZOLIN SODIUM-DEXTROSE 2-4 GM/100ML-% IV SOLN
2.0000 g | INTRAVENOUS | Status: AC
  Administered 2023-12-17: 2 g via INTRAVENOUS

## 2023-12-17 MED ORDER — HYDROCODONE-ACETAMINOPHEN 5-325 MG PO TABS
1.0000 | ORAL_TABLET | Freq: Four times a day (QID) | ORAL | 0 refills | Status: DC | PRN
  Filled 2023-12-17 (×2): qty 40, 5d supply, fill #0

## 2023-12-17 MED ORDER — BUPIVACAINE LIPOSOME 1.3 % IJ SUSP
INTRAMUSCULAR | Status: AC
  Filled 2023-12-17: qty 20

## 2023-12-17 MED ORDER — ONDANSETRON HCL 4 MG/2ML IJ SOLN: 4.0000 mg | Freq: Four times a day (QID) | INTRAMUSCULAR | Status: DC | PRN

## 2023-12-17 MED ORDER — LIDOCAINE HCL (CARDIAC) PF 100 MG/5ML IV SOSY
PREFILLED_SYRINGE | INTRAVENOUS | Status: DC | PRN
  Administered 2023-12-17: 80 mg via INTRAVENOUS

## 2023-12-17 MED ORDER — LIDOCAINE HCL (PF) 1 % IJ SOLN
INTRAMUSCULAR | Status: AC
  Filled 2023-12-17: qty 5

## 2023-12-17 MED ORDER — PHENYLEPHRINE HCL-NACL 20-0.9 MG/250ML-% IV SOLN
INTRAVENOUS | Status: AC
  Filled 2023-12-17: qty 250

## 2023-12-17 MED ORDER — BUPIVACAINE HCL (PF) 0.5 % IJ SOLN
INTRAMUSCULAR | Status: AC
  Filled 2023-12-17: qty 10

## 2023-12-17 MED ORDER — MIDAZOLAM HCL 2 MG/2ML IJ SOLN
INTRAMUSCULAR | Status: AC
Start: 2023-12-17 — End: ?
  Filled 2023-12-17: qty 2

## 2023-12-17 MED ORDER — METOCLOPRAMIDE HCL 5 MG/ML IJ SOLN: 5.0000 mg | Freq: Three times a day (TID) | INTRAMUSCULAR | Status: DC | PRN

## 2023-12-17 MED ORDER — BUPIVACAINE-EPINEPHRINE (PF) 0.5% -1:200000 IJ SOLN
INTRAMUSCULAR | Status: AC
  Filled 2023-12-17: qty 10

## 2023-12-17 MED ORDER — FENTANYL CITRATE (PF) 100 MCG/2ML IJ SOLN: 25.0000 ug | INTRAMUSCULAR | Status: DC | PRN

## 2023-12-17 MED ORDER — OXYCODONE HCL 5 MG/5ML PO SOLN: 5.0000 mg | Freq: Once | ORAL | Status: DC | PRN

## 2023-12-17 MED ORDER — CEFAZOLIN SODIUM-DEXTROSE 2-4 GM/100ML-% IV SOLN
INTRAVENOUS | Status: AC
  Filled 2023-12-17: qty 100

## 2023-12-17 MED ORDER — BUPIVACAINE LIPOSOME 1.3 % IJ SUSP
INTRAMUSCULAR | Status: DC | PRN
  Administered 2023-12-17: 13 mL via PERINEURAL
  Administered 2023-12-17: 7 mL via PERINEURAL

## 2023-12-17 MED ORDER — SODIUM CHLORIDE 0.9 % IV SOLN: INTRAVENOUS | Status: DC

## 2023-12-17 MED ORDER — ONDANSETRON HCL 4 MG/2ML IJ SOLN
INTRAMUSCULAR | Status: DC | PRN
  Administered 2023-12-17: 4 mg via INTRAVENOUS

## 2023-12-17 MED ORDER — ROCURONIUM BROMIDE 100 MG/10ML IV SOLN
INTRAVENOUS | Status: DC | PRN
  Administered 2023-12-17: 50 mg via INTRAVENOUS

## 2023-12-17 MED ORDER — FENTANYL CITRATE (PF) 100 MCG/2ML IJ SOLN
INTRAMUSCULAR | Status: AC
  Filled 2023-12-17: qty 2

## 2023-12-17 MED ORDER — MIDAZOLAM HCL 2 MG/2ML IJ SOLN
1.0000 mg | INTRAMUSCULAR | Status: DC | PRN
  Administered 2023-12-17: 1 mg via INTRAVENOUS

## 2023-12-17 MED ORDER — ONDANSETRON HCL 4 MG/2ML IJ SOLN
INTRAMUSCULAR | Status: AC
  Filled 2023-12-17: qty 2

## 2023-12-17 MED ORDER — FENTANYL CITRATE PF 50 MCG/ML IJ SOSY
50.0000 ug | PREFILLED_SYRINGE | Freq: Once | INTRAMUSCULAR | Status: AC
Start: 2023-12-17 — End: 2023-12-17
  Administered 2023-12-17: 50 ug via INTRAVENOUS

## 2023-12-17 MED ORDER — ACETAMINOPHEN 10 MG/ML IV SOLN
INTRAVENOUS | Status: DC | PRN
  Administered 2023-12-17: 1000 mg via INTRAVENOUS

## 2023-12-17 MED ORDER — SEVOFLURANE IN SOLN
RESPIRATORY_TRACT | Status: AC
  Filled 2023-12-17: qty 250

## 2023-12-17 SURGICAL SUPPLY — 44 items
ANCHOR HEALICOIL REGEN 5.5 (Anchor) IMPLANT
ANCHOR JUGGERKNOT WTAP NDL 2.9 (Anchor) IMPLANT
BIT DRILL JUGRKNT W/NDL BIT2.9 (DRILL) IMPLANT
BLADE FULL RADIUS 3.5 (BLADE) ×1 IMPLANT
BLADE SURG 10 STRL SS (BLADE) IMPLANT
BUR ACROMIONIZER 4.0 (BURR) ×1 IMPLANT
CHLORAPREP W/TINT 26 (MISCELLANEOUS) ×1 IMPLANT
COVER MAYO STAND STRL (DRAPES) ×1 IMPLANT
DILATOR 5.5 THREADED HEALICOIL (MISCELLANEOUS) IMPLANT
DRILL JUGGERKNOT W/NDL BIT 2.9 (DRILL) ×1
ELECT CAUTERY BLADE 6.4 (BLADE) ×1 IMPLANT
ELECT REM PT RETURN 9FT ADLT (ELECTROSURGICAL) ×1
ELECTRODE REM PT RTRN 9FT ADLT (ELECTROSURGICAL) ×1 IMPLANT
GAUZE SPONGE 4X4 12PLY STRL (GAUZE/BANDAGES/DRESSINGS) ×1 IMPLANT
GAUZE XEROFORM 1X8 LF (GAUZE/BANDAGES/DRESSINGS) ×1 IMPLANT
GLOVE BIO SURGEON STRL SZ7.5 (GLOVE) ×2 IMPLANT
GLOVE BIO SURGEON STRL SZ8 (GLOVE) ×2 IMPLANT
GLOVE BIOGEL PI IND STRL 8 (GLOVE) ×1 IMPLANT
GLOVE INDICATOR 8.0 STRL GRN (GLOVE) ×1 IMPLANT
GOWN STRL REUS W/ TWL LRG LVL3 (GOWN DISPOSABLE) ×1 IMPLANT
GOWN STRL REUS W/ TWL XL LVL3 (GOWN DISPOSABLE) ×1 IMPLANT
GRASPER SUT 15 45D LOW PRO (SUTURE) IMPLANT
IV LR IRRIG 3000ML ARTHROMATIC (IV SOLUTION) ×2 IMPLANT
KIT CANNULA 8X76-LX IN CANNULA (CANNULA) ×1 IMPLANT
MANIFOLD NEPTUNE II (INSTRUMENTS) ×1 IMPLANT
MASK FACE SPIDER DISP (MASK) ×1 IMPLANT
MAT ABSORB FLUID 56X50 GRAY (MISCELLANEOUS) ×1 IMPLANT
PACK ARTHROSCOPY SHOULDER (MISCELLANEOUS) ×1 IMPLANT
PAD ABD DERMACEA PRESS 5X9 (GAUZE/BANDAGES/DRESSINGS) ×2 IMPLANT
PASSER SUT FIRSTPASS SELF (INSTRUMENTS) IMPLANT
SLING ARM LRG DEEP (SOFTGOODS) ×1 IMPLANT
SLING ULTRA II LG (MISCELLANEOUS) ×1 IMPLANT
SPONGE T-LAP 18X18 ~~LOC~~+RFID (SPONGE) ×1 IMPLANT
STAPLER SKIN PROX 35W (STAPLE) ×1 IMPLANT
STRAP SAFETY 5IN WIDE (MISCELLANEOUS) ×1 IMPLANT
SUT ETHIBOND 0 MO6 C/R (SUTURE) ×1 IMPLANT
SUT ULTRABRAID 2 COBRAID 38 (SUTURE) IMPLANT
SUT VIC AB 2-0 CT1 TAPERPNT 27 (SUTURE) ×2 IMPLANT
TAPE MICROFOAM 4IN (TAPE) ×1 IMPLANT
TRAP FLUID SMOKE EVACUATOR (MISCELLANEOUS) ×1 IMPLANT
TUBE SET DOUBLEFLO INFLOW (TUBING) ×1 IMPLANT
TUBING CONNECTING 10 (TUBING) ×1 IMPLANT
WAND WEREWOLF FLOW 90D (MISCELLANEOUS) ×1 IMPLANT
WATER STERILE IRR 500ML POUR (IV SOLUTION) ×1 IMPLANT

## 2023-12-17 NOTE — Discharge Instructions (Addendum)
Orthopedic discharge instructions: Keep dressing dry and intact.  May shower once nerve block has worn off (around Saturday).  Cover staples with Band-Aids after drying off. Apply ice frequently to shoulder. Take ibuprofen 600-800 mg TID with meals for 2-3 days, then as necessary. Take hydrocodone as prescribed when needed.  May supplement with ES Tylenol if necessary. Keep shoulder immobilizer on at all times except may remove for bathing purposes. Follow-up in 10-14 days or as scheduled.SHOULDER SLING IMMOBILIZER   VIDEO Slingshot 2 Shoulder Brace Application - YouTube ---https://www.porter.info/  INSTRUCTIONS While supporting the injured arm, slide the forearm into the sling. Wrap the adjustable shoulder strap around the neck and shoulders and attach the strap end to the sling using  the "alligator strap tab."  Adjust the shoulder strap to the required length. Position the shoulder pad behind the neck. To secure the shoulder pad location (optional), pull the shoulder strap away from the shoulder pad, unfold the hook material on the top of the pad, then press the shoulder strap back onto the hook material to secure the pad in place. Attach the closure strap across the open top of the sling. Position the strap so that it holds the arm securely in the sling. Next, attach the thumb strap to the open end of the sling between the thumb and fingers. After sling has been fit, it may be easily removed and reapplied using the quick release buckle on shoulder strap. If a neutral pillow or 15 abduction pillow is included, place the pillow at the waistline. Attach the sling to the pillow, lining up hook material on the pillow with the loop on sling. Adjust the waist strap to fit.  If waist strap is too long, cut it to fit. Use the small piece of double sided hook material (located on top of the pillow) to secure the strap end. Place the double sided hook material on the inside of the  cut strap end and secure it to the waist strap.     If no pillow is included, attach the waist strap to the sling and adjust to fit.    Washing Instructions: Straps and sling must be removed and cleaned regularly depending on your activity level and perspiration. Hand wash straps and sling in cold water with mild detergent, rinse, air dry

## 2023-12-17 NOTE — Anesthesia Preprocedure Evaluation (Addendum)
Anesthesia Evaluation  Patient identified by MRN, date of birth, ID band Patient awake    Reviewed: Allergy & Precautions, NPO status , Patient's Chart, lab work & pertinent test results  History of Anesthesia Complications (+) PONV and history of anesthetic complications  Airway Mallampati: III  TM Distance: <3 FB Neck ROM: full    Dental  (+) Chipped, Poor Dentition, Missing   Pulmonary asthma , sleep apnea , former smoker   Pulmonary exam normal breath sounds clear to auscultation       Cardiovascular hypertension, + CAD, + Past MI and + CABG  Normal cardiovascular exam Rhythm:regular Rate:Normal     Neuro/Psych  Headaches  Neuromuscular disease  negative psych ROS   GI/Hepatic Neg liver ROS,GERD  Controlled,,  Endo/Other  diabetes, Type 2    Renal/GU      Musculoskeletal   Abdominal   Peds  Hematology negative hematology ROS (+)   Anesthesia Other Findings Patient has cardiac clearance for this procedure.   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, indeterminate stage, left No date: Neuropathy No date: Obstructive sleep apnea No date: Osteoarthritis No date: Osteopenia No date: PONV (postoperative nausea and vomiting)  Past Surgical History: No date: ACHILLES TENDON REPAIR No date: BLEPHAROPLASTY UPPER EYELID  05/24/16: CARDIAC CATHETERIZATION No date: CORONARY  ANGIOPLASTY 2017: CORONARY ARTERY BYPASS GRAFT No date: FINGER AMPUTATION; Left No date: INSERTION AQUEOUS SHUNT  No date: KNEE ARTHROSCOPY; Left No date: LAPAROSCOPIC CHOLECYSTECTOMY No date: LENS EYE SURGERY  No date: LUMBAR DISC SURGERY     Comment:  l4-5 No date: ROTATOR CUFF REPAIR; Left No date: TIBIA FRACTURE SURGERY  BMI    Body Mass Index: 27.80 kg/m      Reproductive/Obstetrics negative OB ROS                             Anesthesia Physical Anesthesia Plan  ASA: 3  Anesthesia Plan: General ETT   Post-op Pain Management: Regional block*   Induction: Intravenous  PONV Risk Score and Plan: Ondansetron, Dexamethasone, Midazolam and Treatment may vary due to age or medical condition  Airway Management Planned: Oral ETT  Additional Equipment:   Intra-op Plan:   Post-operative Plan: Extubation in OR  Informed Consent: I have reviewed the patients History and Physical, chart, labs and discussed the procedure including the risks, benefits and alternatives for the proposed anesthesia with the patient or authorized representative who has indicated his/her understanding and acceptance.     Dental Advisory Given  Plan Discussed with: Anesthesiologist, CRNA and Surgeon  Anesthesia Plan Comments: (Patient consented for risks of anesthesia including but not limited to:  - adverse reactions to medications - damage to eyes, teeth, lips or other oral mucosa - nerve damage due to positioning  - sore throat or hoarseness - Damage to heart, brain, nerves, lungs, other parts of body or loss of life  Patient voiced understanding and assent.)       Anesthesia Quick Evaluation

## 2023-12-17 NOTE — Op Note (Signed)
12/17/2023  1:27 PM  Patient:   Robert Fernandez  Pre-Op Diagnosis:   Impingement/tendinopathy with partial-thickness rotator cuff tear and adhesive capsulitis, right shoulder.  Post-Op Diagnosis:   Impingement/tendinopathy with full-thickness rotator cuff tear, adhesive capsulitis, degenerative labral fraying, and biceps tendinopathy, right shoulder.  Procedure:   Extensive arthroscopic debridement, arthroscopic subacromial decompression, mini-open rotator cuff repair, mini-open biceps tenodesis, and manipulation under anesthesia, right shoulder.  Anesthesia:   General endotracheal with interscalene block using Exparel placed preoperatively by the anesthesiologist.  Surgeon:   Maryagnes Amos, MD  Assistant:   Horris Latino, PA-C  Findings:   As above. Prior to manipulation, the right shoulder could be forward flexed to 110 and abducted to 95. At 90 of abduction, the shoulder could be externally rotated to 70 and internally rotated to 50. Following manipulation, the shoulder could be forward flexed to 165, abducted to 160 and, at 90 of abduction, externally rotated to 95 and internally rotated to 65.  There was moderate degenerative fraying of the labrum anteriorly and superiorly without detachment from the glenoid rim. There was a substantial articular sided partial-thickness tear with a small full-thickness component involving the anterior insertional fibers of the supraspinatus tendon. The remainder the rotator cuff was in satisfactory condition. There were grade 1 chondromalacial changes involving the central portion of the glenoid. Otherwise, the articular surfaces of the glenoid humerus both were in satisfactory condition.  Complications:   None  Fluids:   600 cc  Estimated blood loss:   10 cc  Tourniquet time:   None  Drains:   None  Closure:   Staples      Brief clinical note:   The patient is a 67 year old male with a history of progressively worsening pain,  weakness, and stiffness of the right shoulder. The patient's symptoms have progressed despite medications, activity modification, etc. The patient's history and examination are consistent with impingement/tendinopathy with a rotator cuff tear. These findings were confirmed by MRI scan. The patient presents at this time for definitive management of these shoulder symptoms.  Procedure:   The patient underwent placement of an interscalene block using Exparel by the anesthesiologist in the preoperative holding area before being brought into the operating room and lain in the supine position. The patient then underwent general endotracheal intubation and anesthesia before being repositioned in the beach chair position using the beach chair positioner. The right shoulder and upper extremity were prepped with ChloraPrep solution before being draped sterilely. Preoperative antibiotics were administered. A timeout was performed to confirm the proper surgical site before the expected portal sites and incision site were injected with 0.5% Sensorcaine with epinephrine.   A posterior portal was created and the glenohumeral joint thoroughly inspected with the findings as described above. An anterior portal was created using an outside-in technique. The labrum and rotator cuff were further probed, again confirming the above-noted findings. The areas of labral fraying were debrided back to stable margins using the full-radius resector, as were areas of extensive synovitis. Finally, the torn portion of the supraspinatus tendon also was debrided back to stable margins using the full-radius resector. The ArthroCare wand was inserted and used to release the biceps tendon from its labral anchor. It also was used to obtain hemostasis as well as to "anneal" the labrum superiorly and anteriorly. The instruments were removed from the joint after suctioning the excess fluid.  The camera was repositioned through the posterior portal  into the subacromial space. A separate lateral portal was created  using an outside-in technique. The 3.5 mm full-radius resector was introduced and used to perform a subtotal bursectomy. The ArthroCare wand was then inserted and used to remove the periosteal tissue off the undersurface of the anterior third of the acromion as well as to recess the coracoacromial ligament from its attachment along the anterior and lateral margins of the acromion. The 4.0 mm acromionizing bur was introduced and used to complete the decompression by removing the undersurface of the anterior third of the acromion. The full radius resector was reintroduced to remove any residual bony debris before the ArthroCare wand was reintroduced to obtain hemostasis. The instruments were then removed from the subacromial space after suctioning the excess fluid.  An approximately 4-5 cm incision was made over the anterolateral aspect of the shoulder beginning at the anterolateral corner of the acromion and extending distally in line with the bicipital groove. This incision was carried down through the subcutaneous tissues to expose the deltoid fascia. The raphae between the anterior and middle thirds was identified and this plane developed to provide access into the subacromial space. Additional bursal tissues were debrided sharply using Metzenbaum scissors. The rotator cuff tear was readily identified. The margins were debrided sharply with a #15 blade and the exposed greater tuberosity roughened with a rongeur. The tear was repaired using a single Biomet 2.9 mm JuggerKnot anchor. These sutures were then brought back laterally and secured using one [employer] Healicoil knotless RegeneSorb anchor to create a two-layer closure. Two additional #0 Ethibond interrupted sutures were placed to reinforce the repair. An apparent watertight closure was obtained.  The bicipital groove was identified by palpation and opened for 1-1.5 cm. The biceps  tendon stump was retrieved through this defect. The floor of the bicipital groove was roughened with a curet before another Biomet 2.9 mm JuggerKnot anchor was inserted. Both sets of sutures were passed through the biceps tendon and tied securely to effect the tenodesis. The bicipital sheath was reapproximated using two #0 Ethibond interrupted sutures, incorporating the biceps tendon to further reinforce the tenodesis.  The wound was copiously irrigated with sterile saline solution before the deltoid raphae was reapproximated using 2-0 Vicryl interrupted sutures. The subcutaneous tissues were closed in two layers using 2-0 Vicryl interrupted sutures before the skin was closed using staples. The portal sites also were closed using staples. A sterile bulky dressing was applied to the shoulder before the arm was placed into a shoulder immobilizer. The patient was then awakened, extubated, and returned to the recovery room in satisfactory condition after tolerating the procedure well.

## 2023-12-17 NOTE — Anesthesia Procedure Notes (Signed)
Procedure Name: Intubation Date/Time: 12/17/2023 12:16 PM  Performed by: Taishaun Levels, [place], CRNAPre-anesthesia Checklist: Patient identified, Patient being monitored, Timeout performed, Emergency Drugs available and Suction available Patient Re-evaluated:Patient Re-evaluated prior to induction Oxygen Delivery Method: Circle system utilized Preoxygenation: Pre-oxygenation with 100% oxygen Induction Type: IV induction Ventilation: Mask ventilation without difficulty Laryngoscope Size: McGrath and 4 Grade View: Grade I Tube type: Oral Tube size: 7.5 mm Number of attempts: 1 Airway Equipment and Method: Stylet Placement Confirmation: ETT inserted through vocal cords under direct vision, positive ETCO2 and breath sounds checked- equal and bilateral Secured at: 23 cm Tube secured with: Tape Dental Injury: Teeth and Oropharynx as per pre-operative assessment

## 2023-12-17 NOTE — Transfer of Care (Signed)
Immediate Anesthesia Transfer of Care Note  Patient: Robert Fernandez  Procedure(s) Performed: RIGHT SHOULDER ARTHROSCOPY WITH DEBRIDEMENT, DECOMPRESSION, ROTATOR CUFF REPAIR, BICEPS TENODESIS, MANIPULATION UNDER ANESTHESIA. (Right: Shoulder)  Patient Location: PACU  Anesthesia Type:General  Level of Consciousness: drowsy  Airway & Oxygen Therapy: Patient Spontanous Breathing and Patient connected to face mask oxygen  Post-op Assessment: Report given to RN and Post -op Vital signs reviewed and stable  Post vital signs: Reviewed and stable  Last Vitals:  Vitals Value Taken Time  BP 144/72 12/17/23 1347  Temp 97.5   Pulse 64 12/17/23 1350  Resp 19 12/17/23 1350  SpO2 100 % 12/17/23 1350  Vitals shown include unfiled device data.  Last Pain:  Vitals:   12/17/23 1155  TempSrc:   PainSc: 0-No pain         Complications: No notable events documented.

## 2023-12-17 NOTE — Anesthesia Postprocedure Evaluation (Signed)
Anesthesia Post Note  Patient: Robert Fernandez  Procedure(s) Performed: RIGHT SHOULDER ARTHROSCOPY WITH DEBRIDEMENT, DECOMPRESSION, ROTATOR CUFF REPAIR, BICEPS TENODESIS, MANIPULATION UNDER ANESTHESIA. (Right: Shoulder)  Patient location during evaluation: PACU Anesthesia Type: General Level of consciousness: awake and alert Pain management: pain level controlled Vital Signs Assessment: post-procedure vital signs reviewed and stable Respiratory status: spontaneous breathing, nonlabored ventilation, respiratory function stable and patient connected to nasal cannula oxygen Cardiovascular status: blood pressure returned to baseline and stable Postop Assessment: no apparent nausea or vomiting Anesthetic complications: no   No notable events documented.   Last Vitals:  Vitals:   12/17/23 1431 12/17/23 1443  BP: 131/68 131/67  Pulse: 70 72  Resp: 18 17  Temp: (!) 36.1 C (!) 36.2 C  SpO2: 97% 94%    Last Pain:  Vitals:   12/17/23 1443  TempSrc: Temporal  PainSc: 0-No pain                 Cleda Mccreedy Nakota Ackert

## 2023-12-17 NOTE — H&P (Signed)
History of Present Illness:  Robert Fernandez is a 67 y.o. male who presents today as a result of a call to our clinic for right shoulder pain and weakness.   The patient's symptoms began many years ago and developed without any specific cause or injury. 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 Initially, the patient saw [facility] and received numerous injections with temporary partial relief of his symptoms. More recently, he saw Cranston Neighbor, PA-C. He was diagnosed with advanced degenerative joint disease and sent for an MRI scan before being referred to me for further evaluation and treatment. The patient describes the symptoms as marked (major pain with significant limitations) and have the quality of being aching, miserable, nagging, stabbing, tender, and throbbing. The pain is localized to the lateral arm/shoulder and localized to the anterior shoulder. The symptoms will radiate down into his upper arm to the elbow. These symptoms are aggravated constantly, with normal daily activities, with sleeping, carrying heavy objects, at higher levels of activity, with overhead activity, and reaching behind the back. He has tried acetaminophen, non-steroidal anti-inflammatories (Advil), muscle relaxants , and Neurontin with limited benefit. He has tried rest with limited benefit. He has tried numerous injections, again with limited benefit. The patient denies any neck pain, nor does he note any numbness or paresthesias down his arm to his hand.  This complaint is not work related. He is a sports non-participant.  Shoulder Surgical History:  The patient has had a rotator cuff repair on his left shoulder in the past, but is not had any prior surgery on his right shoulder.  PMH/PSH/Family History/Social History/Meds/Allergies:  I have reviewed past medical, surgical, social and family history, medications and allergies as documented  in the EMR.  Current Outpatient Medications:  albuterol MDI, PROVENTIL, VENTOLIN, PROAIR, HFA 90 mcg/actuation inhaler Inhale 2 inhalations into the lungs every 4 (four) hours as needed 6.7 g 0  aspirin 81 MG EC tablet Take 81 mg by mouth once daily - hx CABG  blood glucose diagnostic test strip Use once daily 100 each 3  brimonidine (ALPHAGAN) 0.2 % ophthalmic solution Place 1 drop into both eyes 2 (two) times daily 30 mL 4  carvediloL (COREG) 6.25 MG tablet Take 1 tablet (6.25 mg total) by mouth 2 (two) times daily with meals 180 tablet 3  diazePAM (VALIUM) 5 MG tablet Take by mouth  dorzolamide-timoloL (COSOPT) 22.3-6.8 mg/mL ophthalmic solution Place 1 drop into both eyes 2 (two) times daily 30 mL 4  flash glucose scanning (FREESTYLE LIBRE 14 DAY) reader Use as directed 1 each 0  flash glucose sensor (FREESTYLE LIBRE 14 DAY SENSOR) kit for glucose monitoring 7 kit 3  FUROsemide (LASIX) 20 MG tablet Take 1 tablet (20 mg total) by mouth daily. 100 tablet 3  gabapentin (NEURONTIN) 300 MG capsule Take 1 capsule twice a day and 3 capsules at bedtime 450 capsule 3  ibuprofen (MOTRIN) 200 MG tablet Take 200 mg by mouth every 6 (six) hours as needed for Pain  insulin DEGLUDEC (TRESIBA FLEXTOUCH U-100) pen injector (concentration 100 units/mL) Inject 20 Units subcutaneously at bedtime (Patient taking differently: Inject 25 Units subcutaneously at bedtime) 15 mL 12  JARDIANCE 25 mg tablet Take 1 tablet (25 mg total) by mouth daily with breakfast 90 tablet 3  meclizine (ANTIVERT) 25 mg tablet Take 1 tablet (25 mg total) by mouth 3 (three) times daily as needed  for Dizziness 30 tablet 1  methocarbamoL (ROBAXIN) 750 MG tablet Take 1 tablet (750 mg total) by mouth 2 (two) times daily as needed 60 tablet 11  omeprazole (PRILOSEC) 40 MG DR capsule Take 1 capsule (40 mg total) by mouth 2 (two) times daily before meals 180 capsule 3  pen needle, diabetic (BD ULTRA-FINE MICRO PEN NEEDLE) 32 gauge x 1/4" needle  Use as directed 100 each 12  pioglitazone (ACTOS) 30 MG tablet Take 1 tablet (30 mg total) by mouth once daily 90 tablet 3  REPATHA SURECLICK 140 mg/mL PnIj INJECT 140MG  UNDER THE SKIN EVERY 14 DAYS 2 mL 0  semaglutide (OZEMPIC) 1 mg/dose (4 mg/3 mL) pen injector Inject 0.75 mLs (1 mg total) subcutaneously once a week 3 mL 11  SHARPS CONTAINER  spironolactone (ALDACTONE) 50 MG tablet Take 1 tablet (50 mg total) by mouth daily. 90 tablet 1  SUMAtriptan (IMITREX) 50 MG tablet Take 1 tablet (50 mg total) by mouth as directed for Migraine May take a second dose after 2 hours if needed. 10 tablet 2  verapamiL (VERELAN) 360 MG SR capsule Take 1 capsule (360 mg total) by mouth at bedtime 90 capsule 3   Allergies:  Amoxicillin-Pot Clavulanate Anaphylaxis, Shortness Of Breath, Palpitations and Syncope (BP drops; Pt. States, "my BP tanked") Codeine Dizziness and Hallucination (COMBATIVE; Pt. States, "It makes me act crazy") Crestor [Rosuvastatin] Other (Muscle Pain and cramps)  Hydromorphone Dizziness and Hallucination  Lipitor [Atorvastatin] Other (Muscle Pain and cramps)  Oxycodone-Acetaminophen Nausea And Vomiting   Past Medical History:   Asthma, unspecified asthma severity, unspecified whether complicated, unspecified whether persistent (HHS-HCC)  Cirrhosis (CMS/HHS-HCC)  Cirrhosis of liver (CMS/HHS-HCC) 07/20/2022  Coronary artery disease  Diabetes mellitus without complication (CMS/HHS-HCC)  states AM BGs 80-120 as of 03/14/23  Drug-induced anaphylaxis  DVT (deep venous thrombosis) (CMS/HHS-HCC)  GERD (gastroesophageal reflux disease)  Heart disease  History of DVT (deep vein thrombosis) 2017  Left leg following after CABG surgery  History of staph infection 2017  after CABG- unable to verify if MRSA, was on IV abx  HLD (hyperlipidemia)  Hypertension  Obstructive sleep apnea (adult) (pediatric) 03/03/2022  no CPAP presently (02/2023) unable to tolerate due to eye condition   Osteoarthritis  Osteoporosis  PONV (postoperative nausea and vomiting)  More recently, not an issue  Poor intravenous access  Posterior tibial tendon dysfunction  Rheumatoid arthritis (CMS/HHS-HCC)  Sleep apnea  Splenomegaly  Type 2 diabetes mellitus (CMS/HHS-HCC)  Wears glasses   Past Surgical History:  CARDIAC SURGERY 2017  CABG  CORONARY ARTERY BYPASS GRAFT 09/2016  LIMA to 99% LM, SVG to LCX at [place], [place]  LENS EYE SURGERY Bilateral 2019  CE AND PCIOL OU  COLONOSCOPY W/BIOPSY N/A 01/19/2021  Procedure: Colonoscopy; Surgeon: Velta Addison, MD; Location: [facility]; Service: Gastroenterology; Laterality: N/A;  ESOPHAGOGASTRODOUDENOSCOPY W/BIOPSY N/A 01/19/2021  Procedure: EGD; Surgeon: Velta Addison, MD; Location: [facility]; Service: Gastroenterology; Laterality: N/A;  INSERTION AQUEOUS SHUNT Right 10/08/2022  Procedure: AQUEOUS SHUNT TO EXTRAOCULAR EQUATORIAL PLATE RESERVOIR, EXTERNAL APPROACH; WITH GRAFT; Surgeon: Henriette Combs, MD; [facility]; Service: Ophthalmology; Laterality: Right;  BLEPHAROPLASTY UPPER EYELID Bilateral 03/18/2023  Procedure: Bilateral upper eyelid blepharoplasty with bilateral blepharoptosis repair; Surgeon: Willia Craze, MD; Location[facility]; Service: Ophthalmology; Laterality: Bilateral;  INSERTION AQUEOUS SHUNT Left 07/22/2023  Procedure: AQUEOUS SHUNT TO EXTRAOCULAR EQUATORIAL PLATE RESERVOIR, EXTERNAL APPROACH; WITH GRAFT -- AHMED; Surgeon: Henriette Combs, MD; [facility]; Service: Ophthalmology; Laterality: Left;  ACHILLES TENDON REPAIR Bilateral  BACK SURGERY  CHOLECYSTECTOMY  CORONARY ANGIOPLASTY  EXPLORATORY LAPAROTOMY  fracture L tibia  FRACTURE SURGERY  HEMORRHOIDECTOMY EXTERNAL  history of rotator cuff repair Left  KNEE ARTHROSCOPY  LAPAROSCOPIC CHOLECYSTECTOMY  lumbar diskectomy   Family History:  Cataracts Mother  Cataracts Father Bill  Cancer  Father Bill  Heart disease Father Bill  High blood pressure (Hypertension) Father Bill  Hyperlipidemia (Elevated cholesterol) Father Bill  Lung cancer Father Bill  Prostate cancer Father Bill  Diabetes Sister Marvetta Gibbons  Diabetes type II Sister Marvetta Gibbons  High blood pressure (Hypertension) Sister Marvetta Gibbons  Kidney disease Sister Marvetta Gibbons  Transplant x2  Osteoarthritis Sister Marvetta Gibbons  Diabetes Maternal Grandmother Alona Bene  Cataracts Maternal Grandmother Alona Bene  Diabetes type II Maternal Grandmother Joyce  Diabetes type II Sister Aloha  High blood pressure (Hypertension) Sister Aloha  Anesthesia problems Neg Hx  Malignant hypertension Neg Hx   Social History:   Socioeconomic History:  Marital status: Married  Spouse name: Kenroy Vicencio  Number of children: 2  Tobacco Use  Smoking status: Former  Current packs/day: 0.00  Average packs/day: 1 pack/day for 20.0 years (20.0 ttl pk-yrs)  Types: Cigarettes  Start date: 01/10/1981  Quit date: 01/10/2001  Years since quitting: 22.9  Passive exposure: Past  Smokeless tobacco: Former  Advertising account planner status: Never Used  Substance and Sexual Activity  Alcohol use: Not Currently  Comment: occasional beverage  Drug use: Never  Sexual activity: Yes  Partners: Female  Birth control/protection: None   Social Drivers of Health:   Physicist, medical Strain: Patient Declined (09/29/2020)  Overall Financial Resource Strain (CARDIA)  Difficulty of Paying Living Expenses: Patient declined  Food Insecurity: Patient Declined (09/29/2020)  Hunger Vital Sign  Worried About Running Out of Food in the Last Year: Patient declined  Ran Out of Food in the Last Year: Patient declined  Transportation Needs: Patient Declined (09/29/2020)  PRAPARE - Risk analyst (Medical): Patient declined  Lack of Transportation (Non-Medical): Patient declined   Review of Systems:  A comprehensive 14 point ROS was performed,  reviewed, and the pertinent orthopaedic findings are documented in the HPI.  Physical Exam:  Vitals:  12/09/23 1326  BP: 118/80  Weight: 98 kg (216 lb)  Height: 182.9 cm (6')  PainSc: 9  PainLoc: Shoulder   General/Constitutional: The patient appears to be well-nourished, well-developed, and in no acute distress. Neuro/Psych: Normal mood and affect, oriented to person, place and time. Eyes: Non-icteric. Pupils are equal, round, and reactive to light, and exhibit synchronous movement. ENT: Unremarkable. Lymphatic: No palpable adenopathy. Respiratory: Lungs clear to auscultation, Normal chest excursion, No wheezes, and Non-labored breathing Cardiovascular: Regular rate and rhythm. No murmurs. and No edema, swelling or tenderness, except as noted in detailed exam. Integumentary: No impressive skin lesions present, except as noted in detailed exam. Musculoskeletal: Unremarkable, except as noted in detailed exam.  Right shoulder exam: SKIN: normal SWELLING: none WARMTH: none LYMPH NODES: no adenopathy palpable CREPITUS: none TENDERNESS: Mildly tender along anterolateral acromion and over bicipital groove ROM (active):  Forward flexion: 75 degrees Abduction: 60 degrees Internal rotation: Posterior aspect of right iliac crest ROM (passive):  Forward flexion: 95 degrees Abduction: 80 degrees ER/IR at 80 abd: 40 degrees / 35 degrees  He complains of significant pain with all motions.  STRENGTH: Forward flexion: 4/5 Abduction: 4/5 External rotation: 4/5 Internal rotation: 4/5 Pain with RC testing: Moderate pain with resisted forward flexion, abduction, and external  rotation.  STABILITY: Normal  SPECIAL TESTS: Juanetta Gosling' test: positive, moderate Speed's test: Equivocally positive Capsulitis - pain w/ passive ER: no Crossed arm test: Mildly positive Crank: Not evaluated Anterior apprehension: Negative Posterior apprehension: Not evaluated  He is neurovascularly intact to the  right upper extremity.  Imaging:  Shoulder X-Ray Imaging: Recent true AP and Y-scapular views of the right shoulder are available for review and have been reviewed by myself. These films demonstrate no evidence for fractures, lytic lesions, or significant degenerative changes. The subacromial space is mildly decreased. There is no subacromial or infra-clavicular spurring. He demonstrates a Type II acromion.  Right Shoulder Imaging, MRI: MRI Shoulder Cartilage: No cartilage abnormality. MRI Shoulder Rotator Cuff: Full thickness tear of the supraspinatus. Retracted to the glenohumeral joint. MRI Shoulder Labrum / Biceps: Biceps tendinopathy. MRI Shoulder Bone: Normal bone.  Both the films and report reviewed by myself and discussed with the patient and his wife.  Assessment:  1. Rotator cuff tendinitis, right.  2. Nontraumatic incomplete tear of right rotator cuff.  3. Tendinitis of upper biceps tendon of right shoulder. 4. Adhesive capsulitis of right shoulder.   Plan:  The treatment options were discussed with the patient and his wife. In addition, patient educational materials were provided regarding the diagnosis and treatment options. The patient is quite frustrated by his symptoms and functional rotations, and is ready to consider more aggressive treatment options. Therefore, I have recommended a surgical procedure, specifically a right shoulder arthroscopy with debridement, decompression, rotator cuff repair, possible biceps tenodesis, and possible manipulation under anesthesia. The procedure was discussed with the patient, as were the potential risks (including bleeding, infection, nerve and/or blood vessel injury, persistent or recurrent pain, failure of the repair, progression of arthritis, need for further surgery, blood clots, strokes, heart attacks and/or arhythmias, pneumonia, etc.) and benefits. The patient states his understanding and wishes to proceed. All of the patient's  questions and concerns were answered. He can call any time with further concerns. He will follow up post-surgery, routine.    H&P reviewed and patient re-examined. No changes.

## 2023-12-17 NOTE — Anesthesia Procedure Notes (Signed)
Anesthesia Regional Block: Interscalene brachial plexus block   Pre-Anesthetic Checklist: , timeout performed,  Correct Patient, Correct Site, Correct Laterality,  Correct Procedure, Correct Position, site marked,  Risks and benefits discussed,  Surgical consent,  Pre-op evaluation,  At surgeon's request and post-op pain management  Laterality: Upper and Right  Prep: chloraprep       Needles:  Injection technique: Single-shot  Needle Type: Stimiplex     Needle Length: 9cm  Needle Gauge: 22     Additional Needles:   Procedures:,,,, ultrasound used (permanent image in chart),,    Narrative:  Start time: 12/17/2023 11:39 AM End time: 12/17/2023 11:41 AM Injection made incrementally with aspirations every 5 mL.  Performed by: Personally   Additional Notes: Patient consented for risk and benefits of nerve block including but not limited to nerve damage, Horner's syndrome, failed block, bleeding and infection.  Patient voiced understanding.  Functioning IV was confirmed and monitors were applied.  Timeout done prior to procedure and prior to any sedation being given to the patient.  Patient confirmed procedure site prior to any sedation given to the patient.  A 50mm 22ga Stimuplex needle was used. Sterile prep,hand hygiene and sterile gloves were used.  Minimal sedation used for procedure.  No paresthesia endorsed by patient during the procedure.  Negative aspiration and negative test dose prior to incremental administration of local anesthetic. The patient tolerated the procedure well with no immediate complications.

## 2023-12-17 NOTE — Progress Notes (Signed)
Was contacted by surgical team regarding cardiac risk prior to surgery.   ok for surgery. he does not need echo prior to surgical procedure.  Debbe Odea, MD [facility].

## 2023-12-19 ENCOUNTER — Encounter: Payer: Self-pay | Admitting: Surgery

## 2023-12-23 DIAGNOSIS — G8929 Other chronic pain: Secondary | ICD-10-CM | POA: Diagnosis not present

## 2023-12-23 DIAGNOSIS — M25511 Pain in right shoulder: Secondary | ICD-10-CM | POA: Diagnosis not present

## 2023-12-23 NOTE — Progress Notes (Addendum)
 Referring Physician:  Miriam Rocky Shams, MD [address] [place],  [place] [address]  Primary Physician:  Miriam Rocky Shams, MD  History of Present Illness: Robert Fernandez has a history of asthma, CAD, DM, GERD, hyperlipidemia, HTN, osteoporosis, RA. History of CABG.   He saw Dr. Dodson backin 2021 and she discussed C7-T1 IL ESI. Does not appear this was done.   Had phone visit with me on 10/22/23. He has known cervical spondylosis with anterior osteophytes along with multilevel foraminal stenosis. He has disc at C6-C7 with mild central stenosis and mild bilateral foraminal stenosis.    Shoulder pain was likely from bilateral shoulders- he had limited/painful ROM at his last visit.   He declined PT at last visit. He was sent to ortho and pain management.   Saw ortho and had right shoulder scope with rotator cuff repair by Dr. Edie on 12/17/23.   He also had right C7-T1 IL ESI with Dr. Marcelino on 12/11/23.   He is here for follow up.   He thinks he had improvement with cervical ESI. He felt good for a week until he had his shoulder surgery- he is in a sling and this is aggravating his neck. He is in PT for his right shoulder.   He is taking hydrocodone  for his shoulder.   Conservative measures:  Physical therapy: in PT for his shoulder. Has not done PT for cervical spine Multimodal medical therapy including regular antiinflammatories: ultram , voltaren , skelaxin   Injections:  right C7-T1 IL ESI with Dr. Marcelino on 12/11/23  Past Surgery:  History of lumbar surgery years ago  Review of Systems:  A 10 point review of systems is negative, except for the pertinent positives and negatives detailed in the HPI.  Past Medical History: Past Medical History:  Diagnosis Date   Arthritis    Asthma    Cirrhosis of liver (HCC)    Coronary artery disease    Coronary artery disease involving native heart without angina pectoris    Diabetes mellitus without  complication (HCC)    Diabetic retinopathy of both eyes (HCC)    GERD (gastroesophageal reflux disease)    Hepatosplenomegaly    History of deep venous thrombosis    Hyperlipidemia    Hypertension    Left adrenal mass (HCC)    Meningioma (HCC)    Migraines    Neovascular glaucoma, indeterminate stage, left    Neuropathy    Obstructive sleep apnea    Osteoarthritis    Osteopenia    PONV (postoperative nausea and vomiting)     Past Surgical History: Past Surgical History:  Procedure Laterality Date   ACHILLES TENDON REPAIR     BLEPHAROPLASTY UPPER EYELID      CARDIAC CATHETERIZATION  05/24/16   CORONARY ANGIOPLASTY     CORONARY ARTERY BYPASS GRAFT  2017   FINGER AMPUTATION Left    INSERTION AQUEOUS SHUNT      KNEE ARTHROSCOPY Left    LAPAROSCOPIC CHOLECYSTECTOMY     LENS EYE SURGERY      LUMBAR DISC SURGERY     l4-5   ROTATOR CUFF REPAIR Left    SHOULDER ARTHROSCOPY WITH SUBACROMIAL DECOMPRESSION, ROTATOR CUFF REPAIR AND BICEP TENDON REPAIR Right 12/17/2023   Procedure: RIGHT SHOULDER ARTHROSCOPY WITH DEBRIDEMENT, DECOMPRESSION, ROTATOR CUFF REPAIR, BICEPS TENODESIS, MANIPULATION UNDER ANESTHESIA.;  Surgeon: Edie Norleen PARAS, MD;  Location: [facility];  Service: Orthopedics;  Laterality: Right;   TIBIA FRACTURE SURGERY      Allergies:  Allergies as of 12/26/2023 - Review Complete 12/17/2023  Allergen Reaction Noted   Rosuvastatin Other (See Comments) 10/16/2019   Augmentin [amoxicillin-pot clavulanate]  11/06/2021   Codeine  11/06/2021   Dilaudid [hydromorphone hcl]  11/06/2021    Medications: Outpatient Encounter Medications as of 12/26/2023  Medication Sig   acetaminophen  (TYLENOL ) 500 MG tablet Take 500 mg by mouth every 6 (six) hours as needed for moderate pain (pain score 4-6).   albuterol  (VENTOLIN  HFA) 108 (90 Base) MCG/ACT inhaler Inhale 2 puffs into the lungs every 4 (four) hours as needed.   aspirin  EC 81 MG tablet Take 81 mg by mouth daily.   brimonidine   (ALPHAGAN ) 0.2 % ophthalmic solution Place 1 drop into both eyes 2 (two) times daily.   brimonidine  (ALPHAGAN ) 0.2 % ophthalmic solution Place 1 drop into both eyes 2 (two) times daily.   carvedilol  (COREG ) 6.25 MG tablet Take 1 tablet (6.25 mg total) by mouth 2 (two) times daily with meals   carvedilol  (COREG ) 6.25 MG tablet Take 1 tablet (6.25 mg total) by mouth 2 (two) times daily with a meal.   Continuous Blood Gluc Receiver (FREESTYLE LIBRE 14 DAY READER) DEVI Use as directed   Continuous Blood Gluc Sensor (FREESTYLE LIBRE 14 DAY SENSOR) MISC Inject 1 kit subcutaneously every 14 (fourteen) days   Continuous Glucose Sensor (FREESTYLE LIBRE 14 DAY SENSOR) MISC for glucose monitoring   dorzolamide -timolol  (COSOPT ) 2-0.5 % ophthalmic solution Place 1 drop into the right eye 2 (two) times daily   dorzolamide -timolol  (COSOPT ) 2-0.5 % ophthalmic solution Place 1 drop into both eyes 2 (two) times daily. (Patient taking differently: Place 1 drop into the left eye 2 (two) times daily.)   empagliflozin  (JARDIANCE ) 25 MG TABS tablet Take 1 tablet (25 mg total) by mouth daily with breakfast.   Evolocumab  (REPATHA  SURECLICK) 140 MG/ML SOAJ Inject 140 mg under the skin every 14 (fourteen) days.   furosemide  (LASIX ) 20 MG tablet Take 1 tablet (20 mg total) by mouth daily.   gabapentin  (NEURONTIN ) 300 MG capsule Take 1 capsule (300 mg total) by mouth 2 (two) times daily AND 3 capsules (900 mg total) at bedtime.   gabapentin  (NEURONTIN ) 300 MG capsule Take 1 capsule (300 mg total) by mouth 2 (two) times daily AND 3 capsules (900 mg total) at bedtime.   HYDROcodone -acetaminophen  (NORCO/VICODIN) 5-325 MG tablet Take 1-2 tablets by mouth every 6 (six) hours as needed for moderate pain (pain score 4-6) or severe pain (pain score 7-10).   HYDROcodone -acetaminophen  (NORCO/VICODIN) 5-325 MG tablet Take 1-2 tablets by mouth every 6 (six) hours as needed for moderate pain (pain 4-6) or severe pain (pain score 7-10).    Insulin  Degludec FlexTouch 100 UNIT/ML SOPN Inject 20 Units into the skin at bedtime.   Insulin  Pen Needle (UNIFINE PENTIPS PLUS) 31G X 6 MM MISC use as directed   meclizine  (ANTIVERT ) 25 MG tablet Take 1 tablet (25 mg total) by mouth 3 (three) times daily as needed for Dizziness   meclizine  (ANTIVERT ) 25 MG tablet Take 1 tablet (25 mg total) by mouth 3 (three) times daily as needed for dizziness   methocarbamol  (ROBAXIN ) 750 MG tablet Take 1 tablet (750 mg total) by mouth 2 (two) times daily as needed.   omeprazole  (PRILOSEC) 40 MG capsule Take 1 capsule (40 mg total) by mouth 2 (two) times daily  before meals.   pioglitazone  (ACTOS ) 30 MG tablet Take 1 tablet (30 mg total) by mouth daily.   Semaglutide , 1 MG/DOSE, (OZEMPIC ,  1 MG/DOSE,) 4 MG/3ML SOPN Inject 1 mg into the skin once a week.   spironolactone  (ALDACTONE ) 50 MG tablet Take 1 tablet (50 mg total) by mouth daily.   verapamil  (VERELAN ) 360 MG 24 hr capsule Take 1 capsule (360 mg total) by mouth at bedtime.   No facility-administered encounter medications on file as of 12/26/2023.    Social History: Social History   Tobacco Use   Smoking status: Former    Types: Cigarettes   Smokeless tobacco: Never  Vaping Use   Vaping status: Never Used  Substance Use Topics   Alcohol use: Never   Drug use: Not Currently    Family Medical History: Family History  Problem Relation Age of Onset   Breast cancer Mother    Heart attack Father    Hypertension Father    Breast cancer Sister    Heart disease Maternal Grandmother     Physical Examination: There were no vitals filed for this visit.    Awake, alert, oriented to person, place, and time.  Speech is clear and fluent. Fund of knowledge is appropriate.   Cranial Nerves: Pupils equal round and reactive to light.  Facial tone is symmetric.    Mild lower posterior cervical tenderness.   He is in sling on right side.   No abnormal lesions on exposed skin.   Strength: Side  Biceps Triceps Deltoid Interossei Grip Wrist Ext. Wrist Flex.  R --- --- --- 5 5 --- ---  L 5 5 5 5 5 5 5    Side Iliopsoas Quads Hamstring PF DF EHL  R 5 5 5 5 5 5   L 5 5 5 5 5 5    He is missing half of his ring finger on his left hand.   Exam limited due to recent right shoulder surgery.   Left upper extremity sensation is intact to light touch.     Good sensation in right hand, skin is warm to touch.   Gait is slow.   Medical Decision Making  Imaging: None   Assessment and Plan: Mr. Muegge had right shoulder scope with rotator cuff repair by Dr. Edie on 12/17/23.   He thinks he had improvement with cervical ESI (12/10/24). He felt good for a week until he had his shoulder surgery- he is in a sling and this is aggravating his neck. He is in PT for his right shoulder.    He has known cervical spondylosis with anterior osteophytes along with multilevel foraminal stenosis. He has disc at C6-C7 with mild central stenosis and mild bilateral foraminal stenosis.   Treatment options discussed with patient and following plan made:   - Focus on recovery from right shoulder surgery for now.  - May revisit PT for cervical spine once he is done with shoulder PT.  - May need to revisit repeat cervical ESI as well.  - Will message him in 6 weeks to check on his progress.   Of note, previous cervical xray showed left carotid calcifications. Sent message to PCP about this and we discussed. He would like message sent to his cardiologist as well. This was done.   I spent a total of 15 minutes in face-to-face and non-face-to-face activities related to this patient's care today including review of outside records, review of imaging, review of symptoms, physical exam, discussion of differential diagnosis, discussion of treatment options, and documentation.   Glade Boys PA-C Dept. of Neurosurgery

## 2023-12-24 ENCOUNTER — Ambulatory Visit: Payer: Medicare Other | Admitting: Gastroenterology

## 2023-12-26 ENCOUNTER — Encounter: Payer: Self-pay | Admitting: Orthopedic Surgery

## 2023-12-26 ENCOUNTER — Ambulatory Visit (INDEPENDENT_AMBULATORY_CARE_PROVIDER_SITE_OTHER): Payer: Commercial Managed Care - PPO | Admitting: Orthopedic Surgery

## 2023-12-26 ENCOUNTER — Other Ambulatory Visit: Payer: Self-pay

## 2023-12-26 VITALS — BP 118/80 | Ht 72.0 in | Wt 205.0 lb

## 2023-12-26 DIAGNOSIS — M5030 Other cervical disc degeneration, unspecified cervical region: Secondary | ICD-10-CM

## 2023-12-26 DIAGNOSIS — M50323 Other cervical disc degeneration at C6-C7 level: Secondary | ICD-10-CM | POA: Diagnosis not present

## 2023-12-26 DIAGNOSIS — M4802 Spinal stenosis, cervical region: Secondary | ICD-10-CM

## 2023-12-26 DIAGNOSIS — M5412 Radiculopathy, cervical region: Secondary | ICD-10-CM

## 2023-12-26 DIAGNOSIS — M4722 Other spondylosis with radiculopathy, cervical region: Secondary | ICD-10-CM

## 2023-12-26 DIAGNOSIS — M25511 Pain in right shoulder: Secondary | ICD-10-CM | POA: Diagnosis not present

## 2023-12-26 DIAGNOSIS — M47812 Spondylosis without myelopathy or radiculopathy, cervical region: Secondary | ICD-10-CM

## 2023-12-27 ENCOUNTER — Other Ambulatory Visit: Payer: Self-pay

## 2023-12-30 DIAGNOSIS — G8929 Other chronic pain: Secondary | ICD-10-CM | POA: Diagnosis not present

## 2023-12-30 DIAGNOSIS — M25511 Pain in right shoulder: Secondary | ICD-10-CM | POA: Diagnosis not present

## 2023-12-31 ENCOUNTER — Other Ambulatory Visit: Payer: Self-pay

## 2023-12-31 DIAGNOSIS — E1141 Type 2 diabetes mellitus with diabetic mononeuropathy: Secondary | ICD-10-CM | POA: Diagnosis not present

## 2023-12-31 DIAGNOSIS — Z794 Long term (current) use of insulin: Secondary | ICD-10-CM | POA: Diagnosis not present

## 2023-12-31 MED ORDER — OZEMPIC (2 MG/DOSE) 8 MG/3ML ~~LOC~~ SOPN
2.0000 mg | PEN_INJECTOR | SUBCUTANEOUS | 3 refills | Status: DC
  Filled 2023-12-31: qty 3, 28d supply, fill #0
  Filled 2024-07-17: qty 3, 28d supply, fill #1

## 2023-12-31 NOTE — Progress Notes (Signed)
 CC: diabetes  PRIMARY ENDOCRINOLOGIST: none  Referring provider: Diona Floss, NP  Problem list: Patient Active Problem List  Diagnosis  . Asthma (HHS-HCC)  . Coronary artery disease involving native heart without angina pectoris  . Erectile dysfunction  . HLD (hyperlipidemia)  . HTN (hypertension)  . Hx of CABG  . Meningioma (CMS/HHS-HCC)  . Osteoarthritis  . Overweight (BMI 25.0-29.9)  . Poorly controlled type 2 diabetes mellitus (CMS/HHS-HCC)  . History of deep venous thrombosis  . Osteopenia  . Plantar fasciitis  . Migraines  . Diabetic retinopathy of both eyes (CMS/HHS-HCC)  . Hepatosplenomegaly  . Left adrenal mass (CMS/HHS-HCC)  . SOBOE (shortness of breath on exertion)  . Nocturnal leg cramps  . Unintended weight loss  . Chronic diarrhea  . Dysfunction of Eustachian tube, bilateral  . Cervicalgia  . Cervical radiculitis  . Obstructive sleep apnea (adult) (pediatric)  . Cirrhosis of liver (CMS/HHS-HCC)  . Raised intraocular pressure of right eye  . 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 Neovascular glaucoma, indeterminate stage, left  . Preoperative evaluation of a medical condition to rule out surgical contraindications (TAR required)  . Nontraumatic complete tear of right rotator cuff  . Rotator cuff tendinitis, right  . Tendinitis of upper biceps tendon of right shoulder  . Myofascial pain  . Pain of left calf  . Pain of right lower extremity  . Strain of calf muscle  . Adhesive capsulitis of right shoulder  . Degenerative tear of glenoid labrum of right shoulder    Medical history: Past Medical History:  Diagnosis Date  . Asthma, unspecified asthma severity, unspecified whether complicated, unspecified whether persistent (HHS-HCC)   . Cirrhosis (CMS/HHS-HCC)   . Cirrhosis of liver (CMS/HHS-HCC) 07/20/2022  . Coronary artery disease    . Diabetes mellitus without complication (CMS/HHS-HCC)    states AM BGs 80-120 as of 03/14/23  . Drug-induced anaphylaxis   . DVT (deep venous thrombosis) (CMS/HHS-HCC)   . GERD (gastroesophageal reflux disease)   . Heart disease   . History of DVT (deep vein thrombosis) 2017   Left leg following after CABG surgery  . History of staph infection 2017   after CABG- unable to verify if MRSA, was on IV abx  . HLD (hyperlipidemia)   . Hypertension   . Obstructive sleep apnea (adult) (pediatric) 03/03/2022   no CPAP presently (02/2023)  unable to tolerate due to eye condition  . Osteoarthritis   . Osteoporosis   . PONV (postoperative nausea and vomiting)    More recently, not an issue  . Poor intravenous access   . Posterior tibial tendon dysfunction   . Rheumatoid arthritis (CMS/HHS-HCC)   . Sleep apnea   . Splenomegaly   . Type 2 diabetes mellitus (CMS/HHS-HCC)   . Wears glasses     Past surgical history: Past Surgical History:  Procedure Laterality Date  . CARDIAC SURGERY  2017   CABG  . CORONARY ARTERY BYPASS GRAFT  09/2016   LIMA to 99% LM, SVG to LCX at [place], [place]  . LENS EYE SURGERY Bilateral 2019   CE AND PCIOL OU   . COLONOSCOPY W/BIOPSY N/A 01/19/2021   Procedure: Colonoscopy;  Surgeon: Nola Tobias Brunt, MD;  Location: [facility];  Service: Gastroenterology;  Laterality: N/A;  . CORI W/BIOPSY N/A 01/19/2021   Procedure: EGD;  Surgeon: Nola Tobias Brunt, MD;  Location: [facility];  Service: Gastroenterology;  Laterality: N/A;  . INSERTION AQUEOUS SHUNT Right 10/08/2022   Procedure: AQUEOUS SHUNT TO EXTRAOCULAR EQUATORIAL PLATE RESERVOIR, EXTERNAL APPROACH; WITH GRAFT;  Surgeon: Arminda Rockie Section, MD;  [facility];  Service: Ophthalmology;  Laterality: Right;  . BLEPHAROPLASTY UPPER EYELID Bilateral 03/18/2023   Procedure: Bilateral upper eyelid blepharoplasty with bilateral blepharoptosis repair;   Surgeon: Barrington Selinda Righter, MD;  Location[facility];  Service: Ophthalmology;  Laterality: Bilateral;  . INSERTION AQUEOUS SHUNT Left 07/22/2023   Procedure: AQUEOUS SHUNT TO EXTRAOCULAR EQUATORIAL PLATE RESERVOIR, EXTERNAL APPROACH; WITH GRAFT -- AHMED;  Surgeon: Arminda Rockie Section, MD;  [facility];  Service: Ophthalmology;  Laterality: Left;  . shoulder surgery   12/17/2023  .  eye surgery stunt put in    . ACHILLES TENDON REPAIR Bilateral   . BACK SURGERY    . CHOLECYSTECTOMY    . CORONARY ANGIOPLASTY    . EXPLORATORY LAPAROTOMY    . fracture L tibia    . FRACTURE SURGERY    . HEMORRHOIDECTOMY EXTERNAL    . history of rotator cuff repair Left   . KNEE ARTHROSCOPY    . LAPAROSCOPIC CHOLECYSTECTOMY    . lumbar diskectomy      Medications: Current Outpatient Medications  Medication Sig Dispense Refill  . acetaminophen  (TYLENOL ) 500 MG tablet Take 500 mg by mouth every 6 (six) hours as needed for Pain    . albuterol  MDI, PROVENTIL , VENTOLIN , PROAIR , HFA 90 mcg/actuation inhaler Inhale 2 inhalations into the lungs every 4 (four) hours as needed 6.7 g 0  . aspirin  81 MG EC tablet Take 81 mg by mouth once daily - hx  CABG    . blood glucose diagnostic test strip Use once daily 100 each 3  . brimonidine  (ALPHAGAN ) 0.2 % ophthalmic solution Place 1 drop into both eyes 2 (two) times daily 30 mL 4  . carvediloL  (COREG ) 6.25 MG tablet Take 1 tablet (6.25 mg total) by mouth 2 (two) times daily with meals 180 tablet 3  . dorzolamide -timoloL  (COSOPT ) 22.3-6.8 mg/mL ophthalmic solution Place 1 drop into both eyes 2 (two) times daily 30 mL 4  . flash glucose scanning (FREESTYLE LIBRE 14 DAY) reader Use as directed 1 each 0  . flash glucose sensor (FREESTYLE LIBRE 14 DAY SENSOR) kit for glucose monitoring 7 kit 3  . FUROsemide  (LASIX ) 20 MG tablet Take 1 tablet (20 mg total) by mouth daily. 100 tablet 3  . gabapentin  (NEURONTIN ) 300 MG capsule Take 1 capsule twice a day and 3 capsules  at bedtime 450 capsule 3  . HYDROcodone -acetaminophen  (NORCO) 5-325 mg tablet Take 1-2 tablets by mouth every 6 (six) hours as needed for Pain    . JARDIANCE  25 mg tablet Take 1 tablet (25 mg total) by mouth daily with breakfast 90 tablet 3  . meclizine  (ANTIVERT ) 25 mg tablet Take 1 tablet (25 mg total) by mouth 3 (three) times daily as needed for Dizziness 30 tablet 1  . methocarbamoL  (ROBAXIN ) 750 MG tablet Take 1 tablet (750 mg total) by mouth 2 (two) times daily as needed 60 tablet 11  . omeprazole  (PRILOSEC) 40 MG DR capsule Take 1 capsule (40 mg total) by mouth 2 (two) times daily before meals 180 capsule 3  . pen needle, diabetic (BD ULTRA-FINE MICRO PEN NEEDLE) 32 gauge x 1/4 needle Use as directed 100 each 12  . pioglitazone  (ACTOS ) 30 MG tablet Take 1 tablet (30 mg total) by mouth once daily 90 tablet 3  .  SHARPS CONTAINER     . spironolactone  (ALDACTONE ) 50 MG tablet Take 1 tablet (50 mg total) by mouth daily. 90 tablet 1  . SUMAtriptan  (IMITREX ) 50 MG tablet Take 1 tablet (50 mg total) by mouth as directed for Migraine May take a second dose after 2 hours if needed. 10 tablet 2  . verapamiL  (VERELAN ) 360 MG SR capsule Take 1 capsule (360 mg total) by mouth at bedtime 90 capsule 3  . REPATHA  SURECLICK 140 mg/mL PnIj INJECT 140MG  UNDER THE SKIN EVERY 14 DAYS (Patient not taking: Reported on 12/31/2023) 2 mL 0  . semaglutide  (OZEMPIC ) 2 mg/dose (8 mg/3 mL) pen injector Inject 0.75 mLs (2 mg total) subcutaneously once a week 9 mL 3   No current facility-administered medications for this visit.    Allergies: Allergies  Allergen Reactions  . Amoxicillin-Pot Clavulanate Anaphylaxis, Palpitations, Shortness Of Breath, Syncope and Other (See Comments)    BP drops; Pt. States, my BP tanked  Hypotension  . Codeine Dizziness and Hallucination    COMBATIVE; Pt. States, It makes me act crazy  Altered mental state  . Crestor [Rosuvastatin] Other (See Comments) and Muscle Pain    cramps   . Hydromorphone Dizziness and Hallucination  . Lipitor [Atorvastatin] Other (See Comments) and Muscle Pain    cramps  . Oxycodone -Acetaminophen  Nausea And Vomiting       . Hydromorphone Hcl Other (See Comments)    Hallucinations    Social History: Social History   Socioeconomic History  . Marital status: Married    Spouse name: Vayden Weinand  . Number of children: 2  Tobacco Use  . Smoking status: Former    Current packs/day: 0.00    Average packs/day: 1 pack/day for 20.0 years (20.0 ttl pk-yrs)    Types: Cigarettes    Start date: 01/10/1981    Quit date: 01/10/2001    Years since quitting: 22.9    Passive exposure: Past  . Smokeless tobacco: Former  Advertising Account Planner  . Vaping status: Never Used  Substance and Sexual Activity  . Alcohol use: Not Currently    Comment: occasional beverage  . Drug use: Never  . Sexual activity: Yes    Partners: Female    Birth control/protection: None   Social Drivers of Health   Financial Resource Strain: Patient Declined (09/29/2020)   Overall Financial Resource Strain (CARDIA)   . Difficulty of Paying Living Expenses: Patient declined  Food Insecurity: Patient Declined (09/29/2020)   Hunger Vital Sign   . Worried About Programme Researcher, Broadcasting/film/video in the Last Year: Patient declined   . Ran Out of Food in the Last Year: Patient declined  Transportation Needs: Patient Declined (09/29/2020)   PRAPARE - Transportation   . Lack of Transportation (Medical): Patient declined   . Lack of Transportation (Non-Medical): Patient declined  Physical Activity: Insufficiently Active (09/29/2020)   Exercise Vital Sign   . Days of Exercise per Week: 3 days   . Minutes of Exercise per Session: 30 min  Stress: No Stress Concern Present (09/29/2020)   [employer] of Occupational Health - Occupational Stress Questionnaire   . Feeling of Stress : Only a little  Social Connections: Unknown (09/29/2020)   Social Connection and Isolation Panel [NHANES]   .  Frequency of Communication with Friends and Family: More than three times a week   . Frequency of Social Gatherings with Friends and Family: Patient declined   . Attends Religious Services: Never   . Active Member of Clubs or  Organizations: Patient declined   . Attends Banker Meetings: Patient declined   . Marital Status: Married  Housing Stability: Unknown (12/30/2023)   Housing Stability Vital Sign   . Homeless in the Last Year: No    Family History: Family History  Problem Relation Name Age of Onset  . Cataracts Mother    . Cataracts Father Zell   . Cancer Father Zell   . Heart disease Father Zell   . High blood pressure (Hypertension) Father Zell   . Hyperlipidemia (Elevated cholesterol) Father Bill   . Lung cancer Father Zell   . Prostate cancer Father Zell   . Diabetes Sister Michaelle Caldron   . Diabetes type II Sister Michaelle Caldron   . High blood pressure (Hypertension) Sister Michaelle Caldron   . Kidney disease Sister Michaelle Caldron        Transplant x2  . Osteoarthritis Sister Michaelle Caldron   . Diabetes Maternal Grandmother Merlynn   . Cataracts Maternal Grandmother Merlynn   . Diabetes type II Maternal Grandmother Joyce   . Diabetes type II Sister Aloha   . High blood pressure (Hypertension) Sister Jeanne   . Anesthesia problems Neg Hx    . Malignant hypertension Neg Hx      Subjective:   Patient is a pleasant 68 y.o.male here for follow up of type 2 diabetes   DM was diagnosed 20+years ago.used orals and insulin  also. He dose not like injection. Used VGO but came off easily from sweats while working in garden.  No hospitalization for DM FHX of diabetes: MGM, mom and two sisters.  No personal history of pancreatitis No FHX of MTC.   He has an A1c of 9.2% in Oct 2024  Sees ophthalmology closely with surgery.  Limited activities due to poor vision.  S/p L shoulder rotator cuff surgery 11/2023. Is doing PT.    He is currently taking Jardiance  25 mg daily, actos  30 mg  daily, Ozempic  1 mg daily. Tolerated well with some constipation/diarrhea likely IBS, Tresiba  25 units daily.     He stopped metformin due to diarrhea       He  Checks 3-4 times a day.   Denies lows.          He had CT abdomin in July and August 2021 which both revealed a left adrenal mass measuring 0.8 x 2.2 cm with -3 HU, right 2 mm kidney stone and enlarged spleen. His MRI of abdomen unremarkable.   He has mild leg pain, numbness or tingling in the setting of back surgery.  He is taking gabapentin  for headache.   He is also waiting to have foot surgery due to bone spur but postponed.  He had a closed-fracture on Left rib.    He has good hypoglycemia awareness and denies any hypoglycemia.   Diet:I eat everything but I am cutting back on sodas/sweet tea/juice.  Eat 3 meals a day Snacks: bananas, chips, pickles.  Cut back.  Exercise: not much due to afraid of fracture.   Last eye exam was 2024 with DR.s/p cataract removal, laser treatment and shots. Stable. S/p glaucoma surgery.  Follow up every 3-6 months  Last CDE was never and declined to see one. Glucagon emergency kit: not indicated.   In terms to BP, He is taking coreg . Denies any headache or dizziness.   In terms to cholesterol, He is taking Repetha. Used to take Crestor but off due to myalgia. Sees cardiology.    Review of Systems:  General. No fever, chills or recent illness, no change in weight, appetite changes, fatigue HEENT. No change in vision or hearing, no C/O pain or difficulty swallowing, neck stiffness, voice changes Respiratory. No cough or shortness of breath, sleep apnea, chest tightness, cough, high pitched wheeze or stridor Cardiovascular. No chest pain or palpitations, palpitations, waking up short of breath, sleeping on more than one pillow GI. No pain, dyspepsia or change in urinary or bowel habits GU. No dysuria, frequency or hesitancy, decreased amount of urine Musculoskeletal. No joint pain or  injury, problems walking, muscle aching Neurological. No headaches, changes in mental status, no loss of sensation, strength or function, dizziness, lightheadedness, passing out, tremors, weakness Heme No easy bruising or bleeding Integument No color change, rash Psych Not easily agitated, confused, depressed, anxious   The following portions of the patient's history were reviewed and updated as appropriate: allergies, current medications, past family history, past medical history, past social history, past surgical history and problem list.  Objective:   Ht 182.9 cm (6') Comment: stated  Wt 97.5 kg (215 lb) Comment: stated  BMI 29.16 kg/m  General:  Well appearing, no acute distress,  cooperative   Psych:   Appropriate affect.  Alert and oriented x 3.   Foot exam (10/2019): No cuts, sores or ulcers. Monofilament impaired with toes. Dorsalis pedis pulse present.   Secondary foot conditions:    Pre-ulcerative callus: no    H/o foot ulceration: no    Foot deformity: (ie Bunions, hammer/claw toes; prominent metatarsal head): no    Circulation: good      H/o foot/toe amputation: no   Lab Review : Narrative  [facility] - 07/28/2020 9:00 AM EDT   CLINICAL DATA:  Adrenal lesion seen on prior CT, previous CT not available for review.  EXAM: CT ABDOMEN AND PELVIS WITHOUT CONTRAST  TECHNIQUE: Multidetector CT imaging of the abdomen and pelvis was performed following the standard protocol without IV contrast.  COMPARISON:  No available imaging. Report from 06/28/2020 is available on care everywhere.  FINDINGS: Lower chest: Basilar atelectasis. No effusion. No consolidation. No pericardial fluid. Signs of median sternotomy. Partially imaged.  Hepatobiliary: Cirrhotic liver. Post cholecystectomy. No gross biliary duct dilation.  Pancreas: Pancreas with normal contour. No peripancreatic inflammation or gross ductal dilation.  Spleen: Spleen is enlarged in this  patient with presumed portal hypertension in the setting of cirrhosis measuring 14 cm greatest craniocaudal dimension.  Adrenals/Urinary Tract: Low-density adrenal thickening versus small adrenal lesion measuring 10 mm greatest thickness on image 36 of series 2 on the LEFT with -3 Hounsfield unit density compatible with either hyperplasia or small adenoma. RIGHT adrenal is normal.  Nephrolithiasis on the RIGHT with punctate calculus approximately 2 mm in the lower pole of the RIGHT kidney. No ureteral calculi. Urinary bladder moderately distended. No hydronephrosis. Mild cortical scarring of bilateral kidneys.  Stomach/Bowel: Stomach distended with ingested contrast material. Contrast passes into the distal small bowel and in the colon. There is no acute bowel process. Normal appendix. Stool fills much of the colon.  Vascular/Lymphatic: Calcified atheromatous plaque of the abdominal aorta without aneurysmal dilation. No pelvic lymphadenopathy. No retroperitoneal adenopathy. No upper abdominal adenopathy.  Reproductive: Prostate heterogeneous appearance.  Nonspecific on CT.  Other: No ascites. Portosystemic collaterals present throughout the abdomen with recanalized, early recanalized umbilical vein.  Musculoskeletal: Spinal degenerative changes without acute or destructive bone process. Degenerative changes in the hips bilaterally.  IMPRESSION: 1. Low-density adrenal thickening versus small adrenal lesion measuring 10  mm greatest thickness on the LEFT with -3 Hounsfield unit density compatible with either hyperplasia or small adenoma. 2. Cirrhotic liver with portal hypertension. 3. Post cholecystectomy. 4. Nephrolithiasis on the RIGHT with punctate calculus in the lower pole of the RIGHT kidney. 5. Aortic atherosclerosis.  Aortic Atherosclerosis (ICD10-I70.0).   Electronically Signed  By: Isla Blind M.D.  On: 07/28/2020 09:00     Lab Results  Component Value  Date   HGBA1C 9.2 (H) 10/04/2023   HGBA1C 9.3 (H) 06/14/2023   HGBA1C 9.2 (H) 01/21/2023   Lab Results  Component Value Date   MICROALBUR 15.6 12/10/2022   LDLCALC 133 10/04/2023   CREATININE 0.8 10/04/2023    Component     Latest Ref Rng & Units 08/10/2020 09/05/2020 09/05/2020 09/05/2020          9:52 AM  9:52 AM  9:52 AM  Metanephrine, Urine     mcg/24 h  176    Normetanephrine, Ur     mcg/24 h  522    Metanephrines Total Urine     mcg/24 h  698    Metanephrines Collect Duration     h  24    Metanephrines Urine Volume     mL  1600    Catechol Collection Duration     h  24    Catecholamine Urine Volume     mL  1600    Catecholamine-Norepinephrine     15 - 80 mcg/24 h  45    Catecholamine-Epinephrine      <21 mcg/24 h  3.5    Catecholamine-Dopamine     65 - 400 mcg/24 h  190    Urine Total Volume     mL  1,600 1,600 1,600  Collection Period-hours     HRS  24 24 24   Creatinine, Timed Urine     mg/dL   71   Urine Creatinine/TV     800-2,000 mg/TV   1,141   Cortisol, Urine     g/dL    5.8  Cortisol,  Urine/Total Volume     15.0 - 102.0 g/TV    92.8  Calcium,  Urine/Total Volume     50 - 300 mg/TV  186    Cortisol     5.0 - 25.0 g/dL 83.8     TIME INTERVAL       minutes 0     Normetanephrine, Pl     0.0 - 191.8 pg/mL 158.9     Metanephrine, Pl     0.0 - 88.0 pg/mL 110.7 (H)     ACTH      15 - 66 pg/mL 31     Aldosterone     <=21 ng/dL 8.1     Renin Activity, Plasma     ng/mL/h 0.7     Lipase     17 - 51 U/L 43     Amylase     31 - 119 U/L 38       Assessment:   Patient is a pleasant 68 y.o. male here for follow up of type 2 diabetes, uncontrolled, complicated by retinopathy, neuropathy, CAD.  Discussed on diabetes progression and the importance of good control to avoid complications.  Discussed on hyperglycemia and hypoglycemia management Discussed on healthy diet and exercise  Historically DM has been under poor control with A1C >10% in CE. He  has used insulin  before but can not keep up with shots, nor VGO.  Has room to improve diet and lose weight. Declined  to see RD. Tolerate Ozempic  ok with weight loss but stopped since having more vomiting in the setting of glaucoma treatment. Now restarted with good tolerance.   Unable to continue metformin due to diarrhea.  A1C 9.3 %  Libre CGM data is reviewed with improved data but need to scan consistently before bedtime. .  Will increase Ozempic  next and reduce long acting insulin  at a lower dose.  Encourage to make efforts on eating healthy.     We have discussed the importance of glucose control to promote healing on his eye condition and  shoulder surgery   Encourage to eat healthy.    Encourage to use libre CGM regularly--scan 3-4 times a day.   Plan:   1. Diabetes: I recommend to adjust medications as follows:      Patient Instructions  Increase Ozempic  to 2 mg weekly.    Actos  30 mg daily.   Tresiba  20 units at bedtime  Continue Jardiance             - Check glucose levels 3 time a day.  - Before meal, 2 hours after meals, bedtime and during hypoglycemia.  - Advised to follow a low carbohydrate, high protein, well balanced diet.         - Advised on hypoglycemia recognition and management.  - Check feet regularly; wear proper foot wear.             Diabetes maintenance  - Up to date. Orders are placed to be done before next visit   .            -  Declined to see CDE/RD.    2.  Hypertension: goal BP is <140/90  -At goal. Continue current therapy. Not on ACEI/ARB.       3.  Hyperlipidemia: goal LDL <70  - At goal while On Repetha.  Recent LDL 133 due to missing Repetha.             -off crestor due to myalgia--follows with cardiology   4. Left adrenal incidentaloma: 0.8 x 2.2 cm with -3 HU, likely benign adenoma. Patient is clinically stable WITHOUT uncontrolled HTN, rapid weight gain or hypokalemia. Labs for free metanaphrine, aldosterone/renin, ACTH /cortisol and  get 24 h urine tests were all normal 8-08/2020.        Return in about 3 months (around 03/30/2024) for [place], video visit, Tuesdays.    Orders Placed This Encounter  Procedures  . Hemoglobin A1C    Standing Status:   Future    Standing Expiration Date:   04/29/2024    Order Specific Question:   Release to patient    Answer:   Immediate   Requested Prescriptions   Signed Prescriptions Disp Refills  . semaglutide  (OZEMPIC ) 2 mg/dose (8 mg/3 mL) pen injector 9 mL 3    Sig: Inject 0.75 mLs (2 mg total) subcutaneously once a week     This video encounter was conducted with the patient's (or proxy's) verbal consent via secure, interactive audio and video telecommunications while away from clinic/office/hospital.  The patient (or proxy) was instructed to have this encounter in a suitably private space and to only have persons present to whom they give permission to participate. In addition, patient identity was confirmed by use of name plus two identifiers.  29-Dec-2020 E&M) This visit was coded based on time. I spent a total of 30 minutes in both face-to-face and non-face-to-face activities for this visit on the date of this encounter.  I personally performed the service, non-incident to. (WP)   Helyn Cassis, NP

## 2024-01-06 DIAGNOSIS — M25511 Pain in right shoulder: Secondary | ICD-10-CM | POA: Diagnosis not present

## 2024-01-06 DIAGNOSIS — G8929 Other chronic pain: Secondary | ICD-10-CM | POA: Diagnosis not present

## 2024-01-08 ENCOUNTER — Other Ambulatory Visit: Payer: Self-pay

## 2024-01-09 ENCOUNTER — Ambulatory Visit: Payer: Commercial Managed Care - PPO | Attending: Cardiology

## 2024-01-09 ENCOUNTER — Other Ambulatory Visit: Payer: Self-pay

## 2024-01-09 ENCOUNTER — Telehealth: Payer: Self-pay | Admitting: Cardiology

## 2024-01-09 DIAGNOSIS — Z951 Presence of aortocoronary bypass graft: Secondary | ICD-10-CM

## 2024-01-09 DIAGNOSIS — I25810 Atherosclerosis of coronary artery bypass graft(s) without angina pectoris: Secondary | ICD-10-CM | POA: Diagnosis not present

## 2024-01-09 LAB — ECHOCARDIOGRAM COMPLETE
AV Mean grad: 2 mm[Hg]
AV Peak grad: 3.5 mm[Hg]
Ao pk vel: 0.94 m/s
Area-P 1/2: 3.37 cm2
S' Lateral: 3 cm

## 2024-01-09 MED ORDER — PERFLUTREN LIPID MICROSPHERE
1.0000 mL | INTRAVENOUS | Status: AC | PRN
  Administered 2024-01-09: 3 mL via INTRAVENOUS

## 2024-01-09 NOTE — Telephone Encounter (Signed)
Pt c/o medication issue: 1. Name of Medication: Evolocumab (REPATHA SURECLICK) 140 MG/ML SOAJ   2. How are you currently taking this medication (dosage and times per day)? a  3. Are you having a reaction (difficulty breathing--STAT)? no  4. What is your medication issue? Prior Berkley Harvey is needed

## 2024-01-10 ENCOUNTER — Other Ambulatory Visit (HOSPITAL_COMMUNITY): Payer: Self-pay

## 2024-01-10 ENCOUNTER — Telehealth: Payer: Self-pay | Admitting: Pharmacy Technician

## 2024-01-10 NOTE — Telephone Encounter (Signed)
Pharmacy Patient Advocate Encounter   Received notification from Pt Calls Messages that prior authorization for repatha is required/requested.   Insurance verification completed.   The patient is insured through [facility] .   Per test claim: PA required; PA submitted to above mentioned insurance via CoverMyMeds Key/confirmation #/EOC [id number] Status is pending

## 2024-01-13 ENCOUNTER — Other Ambulatory Visit (HOSPITAL_COMMUNITY): Payer: Self-pay

## 2024-01-13 DIAGNOSIS — M25511 Pain in right shoulder: Secondary | ICD-10-CM | POA: Diagnosis not present

## 2024-01-13 DIAGNOSIS — G8929 Other chronic pain: Secondary | ICD-10-CM | POA: Diagnosis not present

## 2024-01-13 NOTE — Telephone Encounter (Signed)
Pharmacy Patient Advocate Encounter  Received notification from [facility] that Prior Authorization for repatha has been APPROVED from 01/13/24 to 01/11/25. Ran test claim, Copay is $24.99 one month. This test claim was processed through [facility]- copay amounts may vary at other pharmacies due to pharmacy/plan contracts, or as the patient moves through the different stages of their insurance plan.   PA #/Case ID/Reference #: [id number]

## 2024-01-22 ENCOUNTER — Ambulatory Visit: Payer: Medicare Other | Admitting: Student in an Organized Health Care Education/Training Program

## 2024-01-27 DIAGNOSIS — Z20828 Contact with and (suspected) exposure to other viral communicable diseases: Secondary | ICD-10-CM | POA: Diagnosis not present

## 2024-01-30 ENCOUNTER — Other Ambulatory Visit: Payer: Self-pay

## 2024-01-30 DIAGNOSIS — R051 Acute cough: Secondary | ICD-10-CM | POA: Diagnosis not present

## 2024-01-30 DIAGNOSIS — R0602 Shortness of breath: Secondary | ICD-10-CM | POA: Diagnosis not present

## 2024-01-30 DIAGNOSIS — R509 Fever, unspecified: Secondary | ICD-10-CM | POA: Diagnosis not present

## 2024-01-30 MED ORDER — HYDROCOD POLI-CHLORPHE POLI ER 10-8 MG/5ML PO SUER
5.0000 mL | Freq: Every evening | ORAL | 0 refills | Status: DC | PRN
  Filled 2024-01-30: qty 70, 14d supply, fill #0

## 2024-01-31 DIAGNOSIS — Z9889 Other specified postprocedural states: Secondary | ICD-10-CM | POA: Diagnosis not present

## 2024-02-03 DIAGNOSIS — E113513 Type 2 diabetes mellitus with proliferative diabetic retinopathy with macular edema, bilateral: Secondary | ICD-10-CM | POA: Diagnosis not present

## 2024-02-03 DIAGNOSIS — H35373 Puckering of macula, bilateral: Secondary | ICD-10-CM | POA: Diagnosis not present

## 2024-02-04 ENCOUNTER — Other Ambulatory Visit: Payer: Self-pay

## 2024-02-04 DIAGNOSIS — J45901 Unspecified asthma with (acute) exacerbation: Secondary | ICD-10-CM | POA: Diagnosis not present

## 2024-02-04 DIAGNOSIS — J209 Acute bronchitis, unspecified: Secondary | ICD-10-CM | POA: Diagnosis not present

## 2024-02-04 MED ORDER — PREDNISONE 20 MG PO TABS
20.0000 mg | ORAL_TABLET | Freq: Every day | ORAL | 0 refills | Status: AC
  Filled 2024-02-04: qty 5, 5d supply, fill #0

## 2024-02-04 MED ORDER — ALBUTEROL SULFATE HFA 108 (90 BASE) MCG/ACT IN AERS
2.0000 | INHALATION_SPRAY | RESPIRATORY_TRACT | 0 refills | Status: AC | PRN
  Filled 2024-02-04: qty 6.7, 16d supply, fill #0

## 2024-02-04 MED ORDER — PROMETHAZINE-DM 6.25-15 MG/5ML PO SYRP
5.0000 mL | ORAL_SOLUTION | Freq: Four times a day (QID) | ORAL | 0 refills | Status: DC | PRN
  Filled 2024-02-04: qty 120, 6d supply, fill #0

## 2024-02-04 MED ORDER — LEVOFLOXACIN 250 MG PO TABS
250.0000 mg | ORAL_TABLET | Freq: Every day | ORAL | 0 refills | Status: AC
  Filled 2024-02-04: qty 7, 7d supply, fill #0

## 2024-02-05 ENCOUNTER — Other Ambulatory Visit: Payer: Self-pay

## 2024-02-07 ENCOUNTER — Emergency Department: Payer: Commercial Managed Care - PPO

## 2024-02-07 ENCOUNTER — Other Ambulatory Visit: Payer: Self-pay

## 2024-02-07 DIAGNOSIS — K449 Diaphragmatic hernia without obstruction or gangrene: Secondary | ICD-10-CM | POA: Diagnosis not present

## 2024-02-07 DIAGNOSIS — R109 Unspecified abdominal pain: Secondary | ICD-10-CM | POA: Insufficient documentation

## 2024-02-07 DIAGNOSIS — R0789 Other chest pain: Secondary | ICD-10-CM | POA: Diagnosis not present

## 2024-02-07 DIAGNOSIS — R079 Chest pain, unspecified: Secondary | ICD-10-CM | POA: Diagnosis not present

## 2024-02-07 DIAGNOSIS — Z5321 Procedure and treatment not carried out due to patient leaving prior to being seen by health care provider: Secondary | ICD-10-CM | POA: Diagnosis not present

## 2024-02-07 DIAGNOSIS — Z951 Presence of aortocoronary bypass graft: Secondary | ICD-10-CM | POA: Insufficient documentation

## 2024-02-07 DIAGNOSIS — N200 Calculus of kidney: Secondary | ICD-10-CM | POA: Diagnosis not present

## 2024-02-07 DIAGNOSIS — G8929 Other chronic pain: Secondary | ICD-10-CM | POA: Diagnosis not present

## 2024-02-07 DIAGNOSIS — Q438 Other specified congenital malformations of intestine: Secondary | ICD-10-CM | POA: Diagnosis not present

## 2024-02-07 DIAGNOSIS — M25511 Pain in right shoulder: Secondary | ICD-10-CM | POA: Diagnosis not present

## 2024-02-07 LAB — CBC
HCT: 40.4 % (ref 39.0–52.0)
Hemoglobin: 13.9 g/dL (ref 13.0–17.0)
MCH: 31 pg (ref 26.0–34.0)
MCHC: 34.4 g/dL (ref 30.0–36.0)
MCV: 90.2 fL (ref 80.0–100.0)
Platelets: 325 10*3/uL (ref 150–400)
RBC: 4.48 MIL/uL (ref 4.22–5.81)
RDW: 13.7 % (ref 11.5–15.5)
WBC: 9 10*3/uL (ref 4.0–10.5)
nRBC: 0 % (ref 0.0–0.2)

## 2024-02-07 LAB — BASIC METABOLIC PANEL
Anion gap: 15 (ref 5–15)
BUN: 17 mg/dL (ref 8–23)
CO2: 25 mmol/L (ref 22–32)
Calcium: 9.4 mg/dL (ref 8.9–10.3)
Chloride: 94 mmol/L — ABNORMAL LOW (ref 98–111)
Creatinine, Ser: 0.78 mg/dL (ref 0.61–1.24)
GFR, Estimated: >60 mL/min (ref >60)
Glucose, Bld: 335 mg/dL — ABNORMAL HIGH (ref 70–99)
Potassium: 3.9 mmol/L (ref 3.5–5.1)
Sodium: 134 mmol/L — ABNORMAL LOW (ref 135–145)

## 2024-02-07 LAB — TROPONIN I (HIGH SENSITIVITY): Troponin I (High Sensitivity): 6 ng/L (ref <18)

## 2024-02-07 NOTE — ED Triage Notes (Signed)
Pt called x2 for repeat labs and vitals .

## 2024-02-07 NOTE — ED Triage Notes (Signed)
Pt sts that he is having left side flank pain that goes up into his back and chest since last night.

## 2024-02-07 NOTE — ED Provider Triage Note (Signed)
Emergency Medicine Provider Triage Evaluation Note  Robert Fernandez , a 68 y.o. male  was evaluated in triage.  Pt complains of left flank and chest pain started yesterday.  No cough, congestion, fevers, chills.  History of CABG.  Review of Systems  Positive:  Negative:   Physical Exam  BP (!) 145/73 (BP Location: Left Arm)   Pulse 98   Temp 97.8 F (36.6 C) (Oral)   Resp 17   Ht 6' (1.829 m)   Wt 94.3 kg   SpO2 95%   BMI 28.21 kg/m  Gen:   Awake, no distress   Resp:  Normal effort  MSK:   Moves extremities without difficulty  Other:    Medical Decision Making  Medically screening exam initiated at 6:52 PM.  Appropriate orders placed.  [name] was informed that the remainder of the evaluation will be completed by another provider, this initial triage assessment does not replace that evaluation, and the importance of remaining in the ED until their evaluation is complete.  Cardiac workup and stone study   Christen Bame, Cordelia Poche 02/07/24 1610

## 2024-02-08 ENCOUNTER — Emergency Department
Admission: EM | Admit: 2024-02-08 | Discharge: 2024-02-08 | Discharge status: Against Medical Advice | Payer: Commercial Managed Care - PPO | Attending: Physician Assistant | Admitting: Physician Assistant

## 2024-02-11 ENCOUNTER — Other Ambulatory Visit: Payer: Self-pay

## 2024-02-12 ENCOUNTER — Other Ambulatory Visit: Payer: Self-pay

## 2024-02-12 MED ORDER — JARDIANCE 25 MG PO TABS
25.0000 mg | ORAL_TABLET | Freq: Every day | ORAL | 3 refills | Status: AC
  Filled 2024-02-12: qty 90, 90d supply, fill #0
  Filled 2024-05-19: qty 90, 90d supply, fill #1
  Filled 2024-08-31: qty 90, 90d supply, fill #2
  Filled 2024-11-30: qty 90, 90d supply, fill #3

## 2024-02-13 ENCOUNTER — Other Ambulatory Visit: Payer: Self-pay

## 2024-02-13 DIAGNOSIS — R052 Subacute cough: Secondary | ICD-10-CM | POA: Diagnosis not present

## 2024-02-13 MED ORDER — HYDROCOD POLI-CHLORPHE POLI ER 10-8 MG/5ML PO SUER
5.0000 mL | Freq: Two times a day (BID) | ORAL | 0 refills | Status: DC | PRN
  Filled 2024-02-13: qty 115, 12d supply, fill #0

## 2024-02-14 ENCOUNTER — Other Ambulatory Visit: Payer: Self-pay

## 2024-02-17 ENCOUNTER — Other Ambulatory Visit: Payer: Self-pay

## 2024-02-19 DIAGNOSIS — G8929 Other chronic pain: Secondary | ICD-10-CM | POA: Diagnosis not present

## 2024-02-19 DIAGNOSIS — M25511 Pain in right shoulder: Secondary | ICD-10-CM | POA: Diagnosis not present

## 2024-02-20 DIAGNOSIS — D329 Benign neoplasm of meninges, unspecified: Secondary | ICD-10-CM | POA: Diagnosis not present

## 2024-02-20 DIAGNOSIS — K7460 Unspecified cirrhosis of liver: Secondary | ICD-10-CM | POA: Diagnosis not present

## 2024-02-20 DIAGNOSIS — H4052X4 Glaucoma secondary to other eye disorders, left eye, indeterminate stage: Secondary | ICD-10-CM | POA: Diagnosis not present

## 2024-02-20 DIAGNOSIS — E1141 Type 2 diabetes mellitus with diabetic mononeuropathy: Secondary | ICD-10-CM | POA: Diagnosis not present

## 2024-02-20 DIAGNOSIS — E11319 Type 2 diabetes mellitus with unspecified diabetic retinopathy without macular edema: Secondary | ICD-10-CM | POA: Diagnosis not present

## 2024-02-21 DIAGNOSIS — G8929 Other chronic pain: Secondary | ICD-10-CM | POA: Diagnosis not present

## 2024-02-21 DIAGNOSIS — M25511 Pain in right shoulder: Secondary | ICD-10-CM | POA: Diagnosis not present

## 2024-02-28 DIAGNOSIS — G8929 Other chronic pain: Secondary | ICD-10-CM | POA: Diagnosis not present

## 2024-02-28 DIAGNOSIS — M25511 Pain in right shoulder: Secondary | ICD-10-CM | POA: Diagnosis not present

## 2024-03-04 DIAGNOSIS — G8929 Other chronic pain: Secondary | ICD-10-CM | POA: Diagnosis not present

## 2024-03-04 DIAGNOSIS — M25511 Pain in right shoulder: Secondary | ICD-10-CM | POA: Diagnosis not present

## 2024-03-06 DIAGNOSIS — G8929 Other chronic pain: Secondary | ICD-10-CM | POA: Diagnosis not present

## 2024-03-06 DIAGNOSIS — M25511 Pain in right shoulder: Secondary | ICD-10-CM | POA: Diagnosis not present

## 2024-03-10 DIAGNOSIS — G8929 Other chronic pain: Secondary | ICD-10-CM | POA: Diagnosis not present

## 2024-03-10 DIAGNOSIS — M25511 Pain in right shoulder: Secondary | ICD-10-CM | POA: Diagnosis not present

## 2024-03-12 ENCOUNTER — Ambulatory Visit: Payer: Medicare Other | Attending: Physician Assistant | Admitting: Physician Assistant

## 2024-03-12 DIAGNOSIS — S8002XA Contusion of left knee, initial encounter: Secondary | ICD-10-CM | POA: Diagnosis not present

## 2024-03-12 DIAGNOSIS — S8001XA Contusion of right knee, initial encounter: Secondary | ICD-10-CM | POA: Diagnosis not present

## 2024-03-12 NOTE — Progress Notes (Deleted)
 Cardiology Office Note    Date:  03/12/2024   ID:  Robert Fernandez 10-15-1956, MRN [id number]  PCP:  Robert Dawley, MD  Cardiologist:  Robert Odea, MD  Electrophysiologist:  None   Chief Complaint: ***  History of Present Illness:   Robert Fernandez is a 68 y.o. male with history of CAD/CABG x2 (LIMA-LAD, SVG-Lcx in 05/2016), hypertension, hyperlipidemia, diabetes, ED who presents for ***  Previously seen by Robert Fernandez cardiology and Robert Fernandez from a cardiac perspective. Established with Dr. Azucena Fernandez 11/2023 and was overall doing okay with one episode of chest pain 1-2 months prior. Takes verapamil for migraines and aldactone for liver disease and abdominal distention. Reportedly intolerant to statins. Instead taking Repatha, although reports missing several doses. Previously on losartan but stopped secondary to hypotension. Takes Coreg for BP and antianginal benefit. He was seen in the ED 2/14 for left flank and chest pain with negative troponin and EKG without ischemic changes. Patient left AMA.    ***  Labs independently reviewed: ***  Past Medical History:  Diagnosis Date   Arthritis    Asthma    Cirrhosis of liver (HCC)    Coronary artery disease    Coronary artery disease involving native heart without angina pectoris    Diabetes mellitus without complication (HCC)    Diabetic retinopathy of both eyes (HCC)    GERD (gastroesophageal reflux disease)    Hepatosplenomegaly    History of deep venous thrombosis    Hyperlipidemia    Hypertension    Left adrenal mass (HCC)    Meningioma (HCC)    Migraines    Neovascular glaucoma, indeterminate stage, left    Neuropathy    Obstructive sleep apnea    Osteoarthritis    Osteopenia    PONV (postoperative nausea and vomiting)     Past Surgical History:  Procedure Laterality Date   ACHILLES TENDON REPAIR     BLEPHAROPLASTY UPPER EYELID      CARDIAC CATHETERIZATION  05/24/16   CORONARY ANGIOPLASTY      CORONARY ARTERY BYPASS GRAFT  2017   FINGER AMPUTATION Left    INSERTION AQUEOUS SHUNT      KNEE ARTHROSCOPY Left    LAPAROSCOPIC CHOLECYSTECTOMY     LENS EYE SURGERY      LUMBAR DISC SURGERY     l4-5   ROTATOR CUFF REPAIR Left    SHOULDER ARTHROSCOPY WITH SUBACROMIAL DECOMPRESSION, ROTATOR CUFF REPAIR AND BICEP TENDON REPAIR Right 12/17/2023   Procedure: RIGHT SHOULDER ARTHROSCOPY WITH DEBRIDEMENT, DECOMPRESSION, ROTATOR CUFF REPAIR, BICEPS TENODESIS, MANIPULATION UNDER ANESTHESIA.;  Surgeon: Christena Flake, MD;  Location: [facility] ORS;  Service: Orthopedics;  Laterality: Right;   TIBIA FRACTURE SURGERY      Current Medications: No outpatient medications have been marked as taking for the 03/12/24 encounter (Appointment) with Robert Barges, PA-C.    Allergies:   Rosuvastatin, Augmentin [amoxicillin-pot clavulanate], Codeine, and Dilaudid [hydromorphone hcl]   Social History   Socioeconomic History   Marital status: Married    Spouse name: Not on file   Number of children: Not on file   Years of education: Not on file   Highest education level: Not on file  Occupational History   Not on file  Tobacco Use   Smoking status: Former    Types: Cigarettes   Smokeless tobacco: Never  Vaping Use   Vaping status: Never Used  Substance and Sexual Activity   Alcohol use: Never   Drug use: Not Currently  Sexual activity: Not on file  Other Topics Concern   Not on file  Social History Narrative   Not on file   Social Drivers of Fernandez   Financial Resource Strain: Patient Declined (09/29/2020)   Received from [facility]   Overall Financial Resource Strain (CARDIA)    Difficulty of Paying Living Expenses: Patient declined  Food Insecurity: Patient Declined (09/29/2020)   Received from [facility]   Hunger Vital Sign    Worried About Running Out of Food in the Last Year: Patient declined    Ran Out of Food in the Last Year: Patient declined   Transportation Needs: Patient Declined (09/29/2020)   Received from [facility] - Transportation    In the past 12 months, has lack of transportation kept you from medical appointments or from getting medications?: Patient declined    Lack of Transportation (Non-Medical): Patient declined  Physical Activity: Insufficiently Active (09/29/2020)   Received from [facility]   Exercise Vital Sign    Days of Exercise per Week: 3 days    Minutes of Exercise per Session: 30 min  Stress: No Stress Concern Present (09/29/2020)   Received from [facility] of Occupational [facility] - Occupational Stress Questionnaire    Feeling of Stress : Only a little  Social Connections: Unknown (09/29/2020)   Received from [facility]   Social Connection and Isolation Panel [NHANES]    Frequency of Communication with Friends and Family: More than three times a week    Frequency of Social Gatherings with Friends and Family: Patient declined    Attends Religious Services: Never    Database administrator or Organizations: Patient declined    Attends Engineer, structural: Patient declined    Marital Status: Married     Family History:  The patient's family history includes Breast cancer in his mother and sister; Heart attack in his father; Heart disease in his maternal grandmother; Hypertension in his father.  ROS:   12-point review of systems is negative unless otherwise noted in the HPI.   EKGs/Labs/Other Studies Reviewed:    Studies reviewed were summarized above. The additional studies were reviewed today: 01/09/2024 Echo complete 1. Left ventricular ejection fraction, by estimation, is 55 to 60%. The  left ventricle has normal function. The left ventricle has no regional  wall motion abnormalities. Left ventricular diastolic parameters are  consistent with Grade I diastolic  dysfunction (impaired  relaxation).   2. Right ventricular systolic function is normal. The right ventricular  size is normal.   3. The mitral valve is normal in structure. No evidence of mitral valve  regurgitation. No evidence of mitral stenosis.   4. The aortic valve is normal in structure. Aortic valve regurgitation is  not visualized. No aortic stenosis is present.   5. The inferior vena cava is normal in size with greater than 50%  respiratory variability, suggesting right atrial pressure of 3 mmHg.   02/17/2024 Sleep study ([facility]) CLINICAL CORRELATION:  1) The degree of sleep apnea in this study is severe and may  contribute to daytime sleepiness and long-term cardiovascular  morbidity.  2) Treatment with PAP therapy is warranted. A CPAP titration  study to determine the most appropriate pressure setting has been  ordered.  3) A sleep consult has been requested. The patient will be  scheduled an appointment in sleep clinic to discuss further  management.   07/22/2020 Stress echo ([facility])  ECG Results: Nondiagnostic due to inadequate heart rate. .  INTERPRETATION ---------------------------------------------------------------   INDETERMINATE.   INDETERMINATE FOR ANY VALVULAR REGURGITATION   NO VALVULAR STENOSIS   Maximum workload of  4.40 METs was achieved during exercise.   NORMAL RESTING BP - APPROPRIATE RESPONSE   11/07/2018 Echo complete ([facility]) NEGATIVE BUBBLE STUDY X2.   NORMAL LEFT VENTRICULAR CAVITY SIZE AND SYSTOLIC FUNCTION WITH AN EJECTION FRACTION OF 65%   BY BIPLANE.   SEPTAL HYPERTROPHY OF THE LEFT VENTRICLE.   MILD  DIASTOLIC DYSFUNCTION.   NORMAL RIGHT VENTRICULAR GLOBAL SYSTOLIC FUNCTION.   NO HEMODYNAMICALLY SIGNIFICANT VALVULAR PATHOLOGY.   ESTIMATED PULMONARY ARTERY PRESSURE IS 27 MMHG.   COMPARED TO PREVIOUS ECHO PERFORMED ON 05/24/2016:. NO SIGNIFICANT CHANGES SEEN.   EKG:  EKG is ordered today.  The EKG ordered today demonstrates ***  Recent Labs: 02/07/2024: BUN 17;  Creatinine, Ser 0.78; Hemoglobin 13.9; Platelets 325; Potassium 3.9; Sodium 134  Recent Lipid Panel No results found for: "CHOL", "TRIG", "HDL", "CHOLHDL", "VLDL", "LDLCALC", "LDLDIRECT"  PHYSICAL EXAM:    VS:  There were no vitals taken for this visit.  BMI: There is no height or weight on file to calculate BMI.  Physical Exam  Wt Readings from Last 3 Encounters:  02/07/24 208 lb (94.3 kg)  12/26/23 205 lb (93 kg)  12/17/23 205 lb (93 kg)     ASSESSMENT & PLAN:   CAD s/p CABG x2 01/2016   Hypertension   Hyperlipidemia   {Are you ordering a CV Procedure (e.g. stress test, cath, DCCV, TEE, etc)?   Press F2        :[id number]}  Disposition: F/u with Dr. Azucena Fernandez or an APP in ***.   Medication Adjustments/Labs and Tests Ordered: Current medicines are reviewed at length with the patient today.  Concerns regarding medicines are outlined above. Medication changes, Labs and Tests ordered today are summarized above and listed in the Patient Instructions accessible in Encounters.   Velora Mediate, PA-C 03/12/2024 7:59 AM     [facility] [address] [place], [place] [address] [phone]

## 2024-03-16 DIAGNOSIS — M7501 Adhesive capsulitis of right shoulder: Secondary | ICD-10-CM | POA: Diagnosis not present

## 2024-03-18 ENCOUNTER — Other Ambulatory Visit: Payer: Self-pay

## 2024-03-19 DIAGNOSIS — H4053X4 Glaucoma secondary to other eye disorders, bilateral, indeterminate stage: Secondary | ICD-10-CM | POA: Diagnosis not present

## 2024-03-23 ENCOUNTER — Other Ambulatory Visit: Payer: Self-pay

## 2024-03-24 ENCOUNTER — Other Ambulatory Visit: Payer: Self-pay

## 2024-03-24 DIAGNOSIS — M25511 Pain in right shoulder: Secondary | ICD-10-CM | POA: Diagnosis not present

## 2024-03-24 DIAGNOSIS — G8929 Other chronic pain: Secondary | ICD-10-CM | POA: Diagnosis not present

## 2024-03-26 DIAGNOSIS — M25511 Pain in right shoulder: Secondary | ICD-10-CM | POA: Diagnosis not present

## 2024-03-26 DIAGNOSIS — G8929 Other chronic pain: Secondary | ICD-10-CM | POA: Diagnosis not present

## 2024-03-31 DIAGNOSIS — G8929 Other chronic pain: Secondary | ICD-10-CM | POA: Diagnosis not present

## 2024-03-31 DIAGNOSIS — M25511 Pain in right shoulder: Secondary | ICD-10-CM | POA: Diagnosis not present

## 2024-04-03 ENCOUNTER — Other Ambulatory Visit (HOSPITAL_COMMUNITY): Payer: Self-pay

## 2024-04-06 DIAGNOSIS — R0781 Pleurodynia: Secondary | ICD-10-CM | POA: Diagnosis not present

## 2024-04-06 DIAGNOSIS — Z794 Long term (current) use of insulin: Secondary | ICD-10-CM | POA: Diagnosis not present

## 2024-04-06 DIAGNOSIS — M19072 Primary osteoarthritis, left ankle and foot: Secondary | ICD-10-CM | POA: Diagnosis not present

## 2024-04-06 DIAGNOSIS — E1141 Type 2 diabetes mellitus with diabetic mononeuropathy: Secondary | ICD-10-CM | POA: Diagnosis not present

## 2024-04-06 DIAGNOSIS — M79672 Pain in left foot: Secondary | ICD-10-CM | POA: Diagnosis not present

## 2024-04-06 DIAGNOSIS — M25572 Pain in left ankle and joints of left foot: Secondary | ICD-10-CM | POA: Diagnosis not present

## 2024-04-06 DIAGNOSIS — J9811 Atelectasis: Secondary | ICD-10-CM | POA: Diagnosis not present

## 2024-04-06 DIAGNOSIS — M25511 Pain in right shoulder: Secondary | ICD-10-CM | POA: Diagnosis not present

## 2024-04-06 DIAGNOSIS — R0789 Other chest pain: Secondary | ICD-10-CM | POA: Diagnosis not present

## 2024-04-06 DIAGNOSIS — G8929 Other chronic pain: Secondary | ICD-10-CM | POA: Diagnosis not present

## 2024-04-06 DIAGNOSIS — M7732 Calcaneal spur, left foot: Secondary | ICD-10-CM | POA: Diagnosis not present

## 2024-04-09 DIAGNOSIS — Z1331 Encounter for screening for depression: Secondary | ICD-10-CM | POA: Diagnosis not present

## 2024-04-09 DIAGNOSIS — Z1330 Encounter for screening examination for mental health and behavioral disorders, unspecified: Secondary | ICD-10-CM | POA: Diagnosis not present

## 2024-04-09 DIAGNOSIS — Z0000 Encounter for general adult medical examination without abnormal findings: Secondary | ICD-10-CM | POA: Diagnosis not present

## 2024-04-10 ENCOUNTER — Ambulatory Visit: Attending: Physician Assistant | Admitting: Physician Assistant

## 2024-04-10 NOTE — Progress Notes (Deleted)
 Cardiology Office Note    Date:  04/10/2024   ID:  Robert Fernandez, Robert Fernandez 06/29/1956, MRN [id number]  PCP:  [facility], MD  Cardiologist:  Constancia Delton, MD  Electrophysiologist:  None   Chief Complaint: Follow-up  History of Present Illness:   Robert Fernandez is a 68 y.o. male with history of CAD/CABG x 2 (LIMA-LAD, SVG-LCx in 05/2016), hypertension, hyperlipidemia, diabetes, and ED who presents for ***.     Patient was previously followed by [facility] cardiology in [place] [facility] from a cardiac perspective.  He established with Dr. Junnie Olives 11/2019 for at which time he was doing overall well from a cardiac perspective.  He had 1 episode of chest pain 1 to 2 months prior.  He has not tolerated statins in the past.  Previously on losartan was stopped secondary to hypotension.  Takes verapamil  for migraines and Aldactone  for liver disease/abdominal distention.  Takes Coreg  for both BP and antianginal benefit.   Patient was seen in the emergency department 02/07/2024 with complaints of flank pain radiating to his back and chest x 1 day.  EKG without acute ischemic changes.  Troponin negative x 1.  He was found to have a nonobstructing right renal stone.  Patient later left AMA.  ***  Labs independently reviewed: 04/06/2024- 8.5 02/07/2024- troponin negative, Hgb 13.9, HCT 40.4, K 3.9, Cr 0.78, BUN 17 10/04/2023- TC 228, LDL 133, HDL 54, TG 205  Past Medical History:  Diagnosis Date   Arthritis    Asthma    Cirrhosis of liver (HCC)    Coronary artery disease    Coronary artery disease involving native heart without angina pectoris    Diabetes mellitus without complication (HCC)    Diabetic retinopathy of both eyes (HCC)    GERD (gastroesophageal reflux disease)    Hepatosplenomegaly    History of deep venous thrombosis    Hyperlipidemia    Hypertension    Left adrenal mass (HCC)    Meningioma (HCC)    Migraines    Neovascular glaucoma, indeterminate stage, left     Neuropathy    Obstructive sleep apnea    Osteoarthritis    Osteopenia    PONV (postoperative nausea and vomiting)     Past Surgical History:  Procedure Laterality Date   ACHILLES TENDON REPAIR     BLEPHAROPLASTY UPPER EYELID      CARDIAC CATHETERIZATION  05/24/16   CORONARY ANGIOPLASTY     CORONARY ARTERY BYPASS GRAFT  2017   FINGER AMPUTATION Left    INSERTION AQUEOUS SHUNT      KNEE ARTHROSCOPY Left    LAPAROSCOPIC CHOLECYSTECTOMY     LENS EYE SURGERY      LUMBAR DISC SURGERY     l4-5   ROTATOR CUFF REPAIR Left    SHOULDER ARTHROSCOPY WITH SUBACROMIAL DECOMPRESSION, ROTATOR CUFF REPAIR AND BICEP TENDON REPAIR Right 12/17/2023   Procedure: RIGHT SHOULDER ARTHROSCOPY WITH DEBRIDEMENT, DECOMPRESSION, ROTATOR CUFF REPAIR, BICEPS TENODESIS, MANIPULATION UNDER ANESTHESIA.;  Surgeon: Elner Hahn, MD;  Location: [facility] OR[facility];  Service: Orthopedics;  Laterality: Right;   TIBIA FRACTURE SURGERY      Current Medications: No outpatient medications have been marked as taking for the 04/10/24 encounter (Appointment) with Roark Chick, PA-C.    Allergies:   Rosuvastatin, Augmentin [amoxicillin-pot clavulanate], Codeine, and Dilaudid [hydromorphone hcl]   Social History   Socioeconomic History   Marital status: Married    Spouse name: Not on file   Number of children: Not on  file   Years of education: Not on file   Highest education level: Not on file  Occupational History   Not on file  Tobacco Use   Smoking status: Former    Types: Cigarettes   Smokeless tobacco: Never  Vaping Use   Vaping status: Never Used  Substance and Sexual Activity   Alcohol use: Never   Drug use: Not Currently   Sexual activity: Not on file  Other Topics Concern   Not on file  Social History Narrative   Not on file   Social Drivers of Health   Financial Resource Strain: Patient Declined (04/09/2024)   Received from [facility]   Overall Financial Resource Strain (CARDIA)     Difficulty of Paying Living Expenses: Patient declined  Food Insecurity: Patient Declined (04/09/2024)   Received from [facility]   Hunger Vital Sign    Worried About Running Out of Food in the Last Year: Patient declined    Ran Out of Food in the Last Year: Patient declined  Transportation Needs: No Transportation Needs (04/09/2024)   Received from [facility] - Transportation    In the past 12 months, has lack of transportation kept you from medical appointments or from getting medications?: No    Lack of Transportation (Non-Medical): No  Physical Activity: Insufficiently Active (09/29/2020)   Received from [facility]   Exercise Vital Sign    Days of Exercise per Week: 3 days    Minutes of Exercise per Session: 30 min  Stress: No Stress Concern Present (09/29/2020)   Received from [facility] of Occupational Health - Occupational Stress Questionnaire    Feeling of Stress : Only a little  Social Connections: Unknown (09/29/2020)   Received from [facility]   Social Connection and Isolation Panel [NHANES]    Frequency of Communication with Friends and Family: More than three times a week    Frequency of Social Gatherings with Friends and Family: Patient declined    Attends Religious Services: Never    Database administrator or Organizations: Patient declined    Attends Engineer, structural: Patient declined    Marital Status: Married     Family History:  The patient's family history includes Breast cancer in his mother and sister; Heart attack in his father; Heart disease in his maternal grandmother; Hypertension in his father.  ROS:   12-point review of systems is negative unless otherwise noted in the HPI.   EKGs/Labs/Other Studies Reviewed:    Studies reviewed were summarized above. The additional studies were reviewed today:  01/09/2024 Echo  complete 1. Left ventricular ejection fraction, by estimation, is 55 to 60%. The  left ventricle has normal function. The left ventricle has no regional  wall motion abnormalities. Left ventricular diastolic parameters are  consistent with Grade I diastolic  dysfunction (impaired relaxation).   2. Right ventricular systolic function is normal. The right ventricular  size is normal.   3. The mitral valve is normal in structure. No evidence of mitral valve  regurgitation. No evidence of mitral stenosis.   4. The aortic valve is normal in structure. Aortic valve regurgitation is  not visualized. No aortic stenosis is present.   5. The inferior vena cava is normal in size with greater than 50%  respiratory variability, suggesting right atrial pressure of 3 mmHg.   EKG:  EKG is ordered today.  The EKG ordered today demonstrates ***  Recent Labs: 02/07/2024: BUN 17; Creatinine, Ser 0.78; Hemoglobin 13.9; Platelets 325; Potassium 3.9; Sodium 134  Recent Lipid Panel No results found for: "CHOL", "TRIG", "HDL", "CHOLHDL", "VLDL", "LDLCALC", "LDLDIRECT"  PHYSICAL EXAM:    VS:  There were no vitals taken for this visit.  BMI: There is no height or weight on file to calculate BMI.  Physical Exam  Wt Readings from Last 3 Encounters:  02/07/24 208 lb (94.3 kg)  12/26/23 205 lb (93 kg)  12/17/23 205 lb (93 kg)     ASSESSMENT & PLAN:   CAD s/p CABG x 2 (05/2016)   Hypertension   Hyperlipidemia   {Are you ordering a CV Procedure (e.g. stress test, cath, DCCV, TEE, etc)?   Press F2        :[id number]}     Disposition: F/u with Dr. Junnie Olives or an APP in ***.   Medication Adjustments/Labs and Tests Ordered: Current medicines are reviewed at length with the patient today.  Concerns regarding medicines are outlined above. Medication changes, Labs and Tests ordered today are summarized above and listed in the Patient Instructions accessible in Encounters.   Beather Liming,  PA-C 04/10/2024 7:20 AM      [facility] [place] [address] [place], [place] [address] [phone]

## 2024-04-17 ENCOUNTER — Other Ambulatory Visit: Payer: Self-pay

## 2024-04-17 DIAGNOSIS — Z9889 Other specified postprocedural states: Secondary | ICD-10-CM | POA: Diagnosis not present

## 2024-04-17 DIAGNOSIS — M7581 Other shoulder lesions, right shoulder: Secondary | ICD-10-CM | POA: Diagnosis not present

## 2024-04-17 DIAGNOSIS — M7501 Adhesive capsulitis of right shoulder: Secondary | ICD-10-CM | POA: Diagnosis not present

## 2024-04-17 DIAGNOSIS — M75121 Complete rotator cuff tear or rupture of right shoulder, not specified as traumatic: Secondary | ICD-10-CM | POA: Diagnosis not present

## 2024-04-20 NOTE — Progress Notes (Unsigned)
 Referring Physician:  No referring provider defined for this encounter.  Primary Physician:  Damian Duke, MD  History of Present Illness: [name] has a history of asthma, CAD, DM, GERD, hyperlipidemia, HTN, osteoporosis, RA. History of CABG.   Last seen by me on 12/26/23 for neck pain. He has known cervical spondylosis with anterior osteophytes along with multilevel foraminal stenosis. He has disc at C6-C7 with mild central stenosis and mild bilateral foraminal stenosis.   He is s/p right shoulder scope with rotator cuff repair by Dr. Daun Epstein on 12/17/23.   He had good improvement with cervical ESI last year.   He is here for follow up.   He has constant neck pain with stiffness that is worse on right side. He has right shoulder pain from his surgery. No radicular arm pain. He has pain at the base of his skull and between his shoulder blades. He notes some numbness in right arm. No weakness. He's had 2 falls in last month and a half- legs gave way once and he tripped the second time. No dexterity issues.   He continues with right shoulder pain and stiffness after above surgery. Tells me he may need reverse TSA.  He is taking neurontin  and robaxin .    Conservative measures:  Physical therapy: he finished PT for his shoulder, doing stretches at home. Has not done PT for cervical spine Multimodal medical therapy including regular antiinflammatories: ultram , voltaren , skelaxin   Injections:  right C7-T1 IL ESI with Dr. Rhesa Celeste on 12/11/23  Past Surgery:  History of lumbar surgery years ago  Review of Systems:  A 10 point review of systems is negative, except for the pertinent positives and negatives detailed in the HPI.  Past Medical History: Past Medical History:  Diagnosis Date   Arthritis    Asthma    Cirrhosis of liver (HCC)    Coronary artery disease    Coronary artery disease involving native heart without angina pectoris    Diabetes mellitus without  complication (HCC)    Diabetic retinopathy of both eyes (HCC)    GERD (gastroesophageal reflux disease)    Hepatosplenomegaly    History of deep venous thrombosis    Hyperlipidemia    Hypertension    Left adrenal mass (HCC)    Meningioma (HCC)    Migraines    Neovascular glaucoma, indeterminate stage, left    Neuropathy    Obstructive sleep apnea    Osteoarthritis    Osteopenia    PONV (postoperative nausea and vomiting)     Past Surgical History: Past Surgical History:  Procedure Laterality Date   ACHILLES TENDON REPAIR     BLEPHAROPLASTY UPPER EYELID      CARDIAC CATHETERIZATION  05/24/16   CORONARY ANGIOPLASTY     CORONARY ARTERY BYPASS GRAFT  2017   FINGER AMPUTATION Left    INSERTION AQUEOUS SHUNT      KNEE ARTHROSCOPY Left    LAPAROSCOPIC CHOLECYSTECTOMY     LENS EYE SURGERY      LUMBAR DISC SURGERY     l4-5   ROTATOR CUFF REPAIR Left    SHOULDER ARTHROSCOPY WITH SUBACROMIAL DECOMPRESSION, ROTATOR CUFF REPAIR AND BICEP TENDON REPAIR Right 12/17/2023   Procedure: RIGHT SHOULDER ARTHROSCOPY WITH DEBRIDEMENT, DECOMPRESSION, ROTATOR CUFF REPAIR, BICEPS TENODESIS, MANIPULATION UNDER ANESTHESIA.;  Surgeon: Elner Hahn, MD;  Location: [facility] ORS;  Service: Orthopedics;  Laterality: Right;   TIBIA FRACTURE SURGERY      Allergies: Allergies as of 04/21/2024 - Review Complete  04/21/2024  Allergen Reaction Noted   Rosuvastatin Other (See Comments) 10/16/2019   Augmentin [amoxicillin-pot clavulanate]  11/06/2021   Codeine  11/06/2021   Dilaudid [hydromorphone hcl]  11/06/2021    Medications: Outpatient Encounter Medications as of 04/21/2024  Medication Sig   acetaminophen  (TYLENOL ) 500 MG tablet Take 500 mg by mouth every 6 (six) hours as needed for moderate pain (pain score 4-6).   albuterol  (VENTOLIN  HFA) 108 (90 Base) MCG/ACT inhaler Inhale 2 puffs into the lungs every 4 (four) hours as needed.   albuterol  (VENTOLIN  HFA) 108 (90 Base) MCG/ACT inhaler Inhale 2 puffs into  the lungs every 4 (four) hours as needed.   aspirin EC 81 MG tablet Take 81 mg by mouth daily.   brimonidine  (ALPHAGAN ) 0.2 % ophthalmic solution Place 1 drop into both eyes 2 (two) times daily.   brimonidine  (ALPHAGAN ) 0.2 % ophthalmic solution Place 1 drop into both eyes 2 (two) times daily.   carvedilol  (COREG ) 6.25 MG tablet Take 1 tablet (6.25 mg total) by mouth 2 (two) times daily with a meal.   Continuous Blood Gluc Receiver (FREESTYLE LIBRE 14 DAY READER) DEVI Use as directed   Continuous Blood Gluc Sensor (FREESTYLE LIBRE 14 DAY SENSOR) MISC Inject 1 kit subcutaneously every 14 (fourteen) days   Continuous Glucose Sensor (FREESTYLE LIBRE 14 DAY SENSOR) MISC for glucose monitoring   empagliflozin  (JARDIANCE ) 25 MG TABS tablet Take 1 tablet (25 mg total) by mouth daily with breakfast.   empagliflozin  (JARDIANCE ) 25 MG TABS tablet Take 1 tablet (25 mg total) by mouth daily with breakfast.   Evolocumab  (REPATHA  SURECLICK) 140 MG/ML SOAJ Inject 140 mg under the skin every 14 (fourteen) days.   furosemide  (LASIX ) 20 MG tablet Take 1 tablet (20 mg total) by mouth daily.   gabapentin  (NEURONTIN ) 300 MG capsule Take 1 capsule (300 mg total) by mouth 2 (two) times daily AND 3 capsules (900 mg total) at bedtime.   Insulin  Pen Needle (UNIFINE PENTIPS PLUS) 31G X 6 MM MISC use as directed   meclizine  (ANTIVERT ) 25 MG tablet Take 1 tablet (25 mg total) by mouth 3 (three) times daily as needed for Dizziness   meclizine  (ANTIVERT ) 25 MG tablet Take 1 tablet (25 mg total) by mouth 3 (three) times daily as needed for dizziness   methocarbamol  (ROBAXIN ) 750 MG tablet Take 1 tablet (750 mg total) by mouth 2 (two) times daily as needed.   omeprazole  (PRILOSEC) 40 MG capsule Take 1 capsule (40 mg total) by mouth 2 (two) times daily  before meals.   pioglitazone  (ACTOS ) 30 MG tablet Take 1 tablet (30 mg total) by mouth daily.   Semaglutide , 2 MG/DOSE, (OZEMPIC , 2 MG/DOSE,) 8 MG/3ML SOPN Inject 2 mg into the  skin once a week.   spironolactone  (ALDACTONE ) 50 MG tablet Take 1 tablet (50 mg total) by mouth daily.   verapamil  (VERELAN ) 360 MG 24 hr capsule Take 1 capsule (360 mg total) by mouth at bedtime.   [DISCONTINUED] dorzolamide -timolol  (COSOPT ) 2-0.5 % ophthalmic solution Place 1 drop into the right eye 2 (two) times daily   [DISCONTINUED] HYDROcodone -acetaminophen  (NORCO/VICODIN) 5-325 MG tablet Take 1-2 tablets by mouth every 6 (six) hours as needed for moderate pain (pain score 4-6) or severe pain (pain score 7-10).   [DISCONTINUED] HYDROcodone -acetaminophen  (NORCO/VICODIN) 5-325 MG tablet Take 1-2 tablets by mouth every 6 (six) hours as needed for moderate pain (pain 4-6) or severe pain (pain score 7-10).   No facility-administered encounter medications on file as of  04/21/2024.    Social History: Social History   Tobacco Use   Smoking status: Former    Types: Cigarettes   Smokeless tobacco: Never  Vaping Use   Vaping status: Never Used  Substance Use Topics   Alcohol use: Never   Drug use: Not Currently    Family Medical History: Family History  Problem Relation Age of Onset   Breast cancer Mother    Heart attack Father    Hypertension Father    Breast cancer Sister    Heart disease Maternal Grandmother     Physical Examination: Vitals:   04/21/24 1032  BP: 136/84      Awake, alert, oriented to person, place, and time.  Speech is clear and fluent. Fund of knowledge is appropriate.   Cranial Nerves: Pupils equal round and reactive to light.  Facial tone is symmetric.    He has tenderness over occipital region on right with right trapezial tenderness.   No abnormal lesions on exposed skin.   Strength: Side Biceps Triceps Deltoid Interossei Grip Wrist Ext. Wrist Flex.  R 5 5 5 5 5  --- ---  L 5 5 5 5 5 5 5    Side Iliopsoas Quads Hamstring PF DF EHL  R 5 5 5 5 5 5   L 5 5 5 5 5 5    Pain with deltoid testing on right, no gross weakness.   He is missing half of  his ring finger on his left hand.   Sensation intact to light touch in bilateral upper extremities.   Reflexes are 2+ and symmetric at the biceps, brachioradialis, patella and achilles.   Hoffman's is absent.  Clonus is not present.    Gait is slow.   Medical Decision Making  Imaging: None   Assessment and Plan: Mr. Cassada had right shoulder scope with rotator cuff repair by Dr. Daun Epstein on 12/17/23. He continues with pain and stiffness in the right shoulder.   He has constant neck pain with stiffness that is worse on right side. No radicular arm pain. He has pain at the base of his skull and between his shoulder blades. No dexterity issues.   He has known cervical spondylosis with anterior osteophytes along with multilevel foraminal stenosis. He has disc at C6-C7 with mild central stenosis and mild bilateral foraminal stenosis.   Neck pain likely from cervical spondylosis. Right shoulder is likely contributing to his pain as well. May have component of right occipital neuritis as well.   Treatment options discussed with patient and following plan made:   - Recommend PT for cervical spine. He declines.  - Follow up with Dr. Rhesa Celeste to consider repeat cervical ESI and/or right occipital nerve block. Message to [place].  - He is interested in surgery options. Will review with Dr. Felipe Horton and message him. Not sure he is a surgery candidate.  - Discussed that right shoulder is likely contributing to his pain as well.  - Follow up in 6-8 weeks and prn.   I spent a total of 30 minutes in face-to-face and non-face-to-face activities related to this patient's care today including review of outside records, review of imaging, review of symptoms, physical exam, discussion of differential diagnosis, discussion of treatment options, and documentation.   Lucetta Russel PA-C Dept. of Neurosurgery

## 2024-04-21 ENCOUNTER — Encounter: Payer: Self-pay | Admitting: Orthopedic Surgery

## 2024-04-21 ENCOUNTER — Ambulatory Visit (INDEPENDENT_AMBULATORY_CARE_PROVIDER_SITE_OTHER): Admitting: Orthopedic Surgery

## 2024-04-21 ENCOUNTER — Other Ambulatory Visit: Payer: Self-pay

## 2024-04-21 VITALS — BP 136/84 | Ht 72.0 in | Wt 208.0 lb

## 2024-04-21 DIAGNOSIS — M5030 Other cervical disc degeneration, unspecified cervical region: Secondary | ICD-10-CM

## 2024-04-21 DIAGNOSIS — M47812 Spondylosis without myelopathy or radiculopathy, cervical region: Secondary | ICD-10-CM

## 2024-04-21 DIAGNOSIS — M50323 Other cervical disc degeneration at C6-C7 level: Secondary | ICD-10-CM

## 2024-04-21 DIAGNOSIS — M25511 Pain in right shoulder: Secondary | ICD-10-CM

## 2024-04-21 DIAGNOSIS — M4802 Spinal stenosis, cervical region: Secondary | ICD-10-CM

## 2024-04-21 NOTE — Patient Instructions (Signed)
 It was so nice to see you today. Thank you so much for coming in.    Pain in your neck is likely from the wear and tear (arthritis). Right shoulder pain is also likely contributing to right sided neck pain.   Let me know if you change your mind about PT for your neck and I will send orders.   I sent a message to Dr. Donel Fujisawa staff and they should be calling you about a follow up with him. Call them if you don't hear anything. Their number is [phone].   I will have Dr. Felipe Horton review your imaging regarding possible neck surgery and send you a message.   I will see you back in 6-8 weeks. Please do not hesitate to call if you have any questions or concerns. You can also message me in MyChart.   Lucetta Russel PA-C [phone]     The physicians and staff at [facility] are committed to providing excellent care. You may receive a survey asking for feedback about your experience at our office. We value you your feedback and appreciate you taking the time to to fill it out. The [facility] leadership team is also available to discuss your experience in person, feel free to contact us  [phone].

## 2024-04-22 ENCOUNTER — Other Ambulatory Visit: Payer: Self-pay

## 2024-04-23 ENCOUNTER — Encounter: Payer: Self-pay | Admitting: Cardiology

## 2024-04-23 ENCOUNTER — Ambulatory Visit: Attending: Student | Admitting: Student

## 2024-04-23 ENCOUNTER — Other Ambulatory Visit: Payer: Self-pay

## 2024-04-23 ENCOUNTER — Encounter: Payer: Self-pay | Admitting: Student

## 2024-04-23 ENCOUNTER — Telehealth: Payer: Self-pay | Admitting: Cardiology

## 2024-04-23 VITALS — BP 118/52 | HR 75 | Ht 72.0 in | Wt 210.0 lb

## 2024-04-23 DIAGNOSIS — T466X5A Adverse effect of antihyperlipidemic and antiarteriosclerotic drugs, initial encounter: Secondary | ICD-10-CM | POA: Diagnosis not present

## 2024-04-23 DIAGNOSIS — I25810 Atherosclerosis of coronary artery bypass graft(s) without angina pectoris: Secondary | ICD-10-CM | POA: Diagnosis not present

## 2024-04-23 DIAGNOSIS — I10 Essential (primary) hypertension: Secondary | ICD-10-CM | POA: Diagnosis not present

## 2024-04-23 DIAGNOSIS — R42 Dizziness and giddiness: Secondary | ICD-10-CM | POA: Diagnosis not present

## 2024-04-23 DIAGNOSIS — E785 Hyperlipidemia, unspecified: Secondary | ICD-10-CM

## 2024-04-23 DIAGNOSIS — G720 Drug-induced myopathy: Secondary | ICD-10-CM | POA: Diagnosis not present

## 2024-04-23 MED ORDER — CARVEDILOL 3.125 MG PO TABS
3.1250 mg | ORAL_TABLET | Freq: Two times a day (BID) | ORAL | 3 refills | Status: AC
  Filled 2024-04-23: qty 180, 90d supply, fill #0
  Filled 2024-07-28: qty 180, 90d supply, fill #1
  Filled 2024-10-29: qty 180, 90d supply, fill #2
  Filled 2025-01-28 (×2): qty 180, 90d supply, fill #3

## 2024-04-23 MED ORDER — NITROGLYCERIN 0.4 MG SL SUBL
0.4000 mg | SUBLINGUAL_TABLET | SUBLINGUAL | 3 refills | Status: AC | PRN
  Filled 2024-04-23: qty 25, 1d supply, fill #0

## 2024-04-23 NOTE — Patient Instructions (Signed)
 Medication Instructions:  Your physician recommends the following medication changes.   START TAKING: Nitroglycerin  0.4 mg sub-lingual for chest pain; one tablet every 5 minutes with maximum of 3 tablets per incident. The proper use and anticipated side effects of nitroglycerine has been carefully explained.  If a single episode of chest pain is not relieved by one tablet, the patient will try another within 5 minutes; and if this doesn't relieve the pain, the patient is instructed to call 911 for transportation to an emergency department.  DECREASE: Carvedilol  to 3.125 mg two times daily.  *If you need a refill on your cardiac medications before your next appointment, please call your pharmacy*  Testing/Procedures: Your physician has requested that you have a carotid duplex. This test is an ultrasound of the carotid arteries in your neck. It looks at blood flow through these arteries that supply the brain with blood.   Allow one hour for this exam.  There are no restrictions or special instructions.  This will take place at [address] [facility] [address], [place] [address]  Please note: We ask at that you not bring children with you during ultrasound (echo/ vascular) testing. Due to room size and safety concerns, children are not allowed in the ultrasound rooms during exams. Our front office staff cannot provide observation of children in our lobby area while testing is being conducted. An adult accompanying a patient to their appointment will only be allowed in the ultrasound room at the discretion of the ultrasound technician under special circumstances. We apologize for any inconvenience.   Follow-Up: At [facility], you and your health needs are our priority.  As part of our continuing mission to provide you with exceptional heart care, our providers are all part of one team.  This team includes your primary Cardiologist (physician) and Advanced Practice  Providers or APPs (Physician Assistants and Nurse Practitioners) who all work together to provide you with the care you need, when you need it.  Your next appointment:   3 month(s)  Provider:   You may see Constancia Delton, MD or one of the following Advanced Practice Providers on your designated Care Team:   Laneta Pintos, NP Gildardo Labrador, PA-C Varney Gentleman, PA-C Cadence Georgetown, PA-C Ronald Cockayne, NP Morey Ar, NP    We recommend signing up for the patient portal called "MyChart".  Sign up information is provided on this After Visit Summary.  MyChart is used to connect with patients for Virtual Visits (Telemedicine).  Patients are able to view lab/test results, encounter notes, upcoming appointments, etc.  Non-urgent messages can be sent to your provider as well.   To learn more about what you can do with MyChart, go to [email or web address].   Other Instructions Nitroglycerin  0.4 mg (under the tongue dissolving) as needed for chest pain - If a single episode of chest pain is not relieved by one tablet in 5 minutes, take another tablet and after 5 minutes if chest pain continues, take the 3rd tablet; and if this doesn't relieve the pain, call 911 for transportation to an emergency department.

## 2024-04-23 NOTE — Telephone Encounter (Signed)
 Patient with complaint of intermittent dizziness times 2 -3 months. Patient reports that the last time was on Sunday. Per patient symptoms resolve when he rest. Patient has not checked vital signs when he has felt dizzy. Patient reports that his blood pressure and heart rate have been  100-130/60-80 and heart rate in 70's. Patient states that he has to see a neurosurgeon for vertebra in his neck and not sure if it could be related to that. Patient scheduled to be seen in office today.

## 2024-04-23 NOTE — Progress Notes (Signed)
 Cardiology Clinic Note   Date: 04/23/2024 ID: Robert Fernandez, Robert Fernandez [birth year], MRN [id number]  Primary Cardiologist:  Constancia Delton, MD  Chief Complaint   BRENTIN ALANA is a 68 y.o. male who presents to the clinic today for evaluation of dizziness.   Patient Profile   MIRAN KOHOUT is followed by Dr. Junnie Olives for the history outlined below.      Past medical history significant for: CAD. CABG x 25 May 2016: LIMA to LAD, SVG to LCx. Echo 01/09/2024: EF 55 to 60%.  No RWMA.  Grade I DD.  Normal RV size/function.  No significant valvular abnormalities. Hypertension. Hyperlipidemia. Lipid panel 10/04/2023: LDL 133, HDL 54, TG 205, total 228. OSA. Cirrhosis. T2DM. DVT.  In summary, patient previously followed by [facility] cardiology and [facility] health.  He has a history of CABG x 2 in June 2017.  Patient establish care with Dr. Junnie Olives on 12/13/2023.  He reported 1 episode of chest pain 1 to 2 months prior.  Losartan had been stopped for low blood pressure.  Repeat echo demonstrated normal LV/RV function as detailed above.  Patient contacted the office today reporting a 2 to 56-month history of dizziness.  He was scheduled for an office visit to further evaluate.     History of Present Illness    Today, patient is accompanied by his wife. He reports 2-3 month history of positional dizziness that is different from vertigo symptoms. Patient reports when he goes from sit to stand or turns his head he feels dizzy. He has spinal issues from his neck down his back for which he is followed by neurosurgery and pain management. He is uncertain if the dizziness is coming from his cervical spine issues. He denies presyncope or syncope associated with the dizziness. He has had a couple of mechanical falls. He reports dyspnea with heavier exertion. He also has occasional left sided chest discomfort that lasts 2 minutes and resolves on its own not related to exertion. He had an episode in  February along with a feeling of malaise so he went to the ED. His workup was unremarkable with negative troponin x 1. He left without being seen as he began to feel better and the wait was too long. He had one episode since that time. He also experiences edema in bilateral feet and leg cramping. He believes this is related to his spinal issues. He reports adequate hydration.     ROS: All other systems reviewed and are otherwise negative except as noted in History of Present Illness.  EKGs/Labs Reviewed    EKG Interpretation Date/Time:  Thursday Apr 23 2024 11:20:12 EDT Ventricular Rate:  75 PR Interval:  144 QRS Duration:  96 QT Interval:  398 QTC Calculation: 444 R Axis:   32  Text Interpretation: Normal sinus rhythm Normal ECG When compared with ECG of 07-Feb-2024 18:55, T wave inversion no longer evident in Anterior leads QT has shortened Confirmed by Morey Ar [phone]) on 04/23/2024 11:39:55 AM   02/07/2024: BUN 17; Creatinine, Ser 0.78; Potassium 3.9; Sodium 134   02/07/2024: Hemoglobin 13.9; WBC 9.0    Physical Exam    VS:  BP (!) 118/52   Pulse 75   Ht 6' (1.829 m)   Wt 210 lb (95.3 kg)   SpO2 97%   BMI 28.48 kg/m  , BMI Body mass index is 28.48 kg/m.  Orthostatic VS for the past 24 hrs (Last 3 readings):  BP- Lying Pulse- Lying BP- Sitting Pulse- Sitting  BP- Standing at 0 minutes Pulse- Standing at 0 minutes BP- Standing at 3 minutes Pulse- Standing at 3 minutes  04/23/24 1125 115/73 75 98/60 75 99/62 83 111/65 84     GEN: Well nourished, well developed, in no acute distress. Neck: No JVD or carotid bruits. Cardiac:  RRR. No murmurs. No rubs or gallops.   Respiratory:  Respirations regular and unlabored. Clear to auscultation without rales, wheezing or rhonchi. GI: Soft, nontender, nondistended. Extremities: Radials/DP/PT 2+ and equal bilaterally. No clubbing or cyanosis. No edema.  Skin: Warm and dry, no rash. Neuro: Strength intact.  Assessment & Plan    Dizziness Patient reports a 2-3 month history of positional dizziness occurring with sit to stand and turning his head. This is different than vertigo symptoms. He feels it could be coming from his cervical spine as he has been having a lot neck pain and limited range of motion.  He does not have orthostatic hypotension with vitals today.  - Decrease carvedilol  to 3.125 mg bid.  - Carotid US  for further evaluation.   CAD S/p CABG x 25 May 2016 performed at an outside facility.  Echo January 2025 showed normal LV/RV function, Grade I DD, no significant valvular abnormalities.  Patient reports 3 episodes of left sided chest discomfort since November 2024. In February he had an episode along with a general malaise and went to the ED. His workup was unremarkable with negative troponin x 1 but he left without being seen secondary to the long wait and he began feeling better. He had one more episode since. Discomfort is brief lasting 2 minutes and resolving on its own and is not associated with exertion. He would like to defer any further testing at this time.  - Rx prn NTG.  - Continue aspirin, carvedilol , Repatha .   Hypertension BP today 118/52. Dizziness as above. He also suffers from migraines and takes verapamil .  - Continue carvedilol  (see above), verapamil .  Hyperlipidemia/statin myopathy LDL 133 October 2024, not at goal.  - Continue Repatha .  Disposition: Carotid US . Decrease carvedilol  to 3.125 mg bid. PRN NTG. Return in 3 months or sooner as needed.          Signed, Lonell Rives. Lashann Hagg, DNP, NP-C

## 2024-04-23 NOTE — Telephone Encounter (Signed)
 STAT if patient feels like he/she is going to faint   1. Are you feeling dizzy, lightheaded, or faint right now? no    2. Have you passed out?  no (If yes move to .[facility])   3. Do you have any other symptoms? Dizzy, lightheaded, pain in neck, fell recently   4. Have you checked your HR and BP (record if available)? 120-130 range when he went to the doctor earlier in the week

## 2024-04-28 NOTE — Progress Notes (Unsigned)
 Referring Physician:  Damian Duke, MD [address] [place],  [place] [address]  Primary Physician:  Damian Duke, MD  History of Present Illness: 04/29/24 [name] has a history of asthma, CAD, DM, GERD, hyperlipidemia, HTN, osteoporosis, RA. History of CABG. he was sent to me for evaluation of cervical discomfort and spondylosis.  He has had previous epidural spinal injections and has had a recent rotator cuff scope repair with Dr. Bethena Brothers.  He is here today to discuss his neck.  States that he has severe stiffness.  He has pain mostly in his neck that is paracentral bilaterally left often worse than right.  He does not have any pain radiating down his arms or new numbness tingling or weakness.  Conservative measures:  Physical therapy: he finished PT for his shoulder, doing stretches at home. Has not done PT for cervical spine Multimodal medical therapy including regular antiinflammatories: ultram , voltaren , skelaxin   Injections:  right C7-T1 IL ESI with Dr. Rhesa Celeste on 12/11/23  Past Surgery:  History of lumbar surgery years ago  Review of Systems:  A 10 point review of systems is negative, except for the pertinent positives and negatives detailed in the HPI.  Past Medical History: Past Medical History:  Diagnosis Date   Arthritis    Asthma    Cirrhosis of liver (HCC)    Coronary artery disease    Coronary artery disease involving native heart without angina pectoris    Diabetes mellitus without complication (HCC)    Diabetic retinopathy of both eyes (HCC)    GERD (gastroesophageal reflux disease)    Hepatosplenomegaly    History of deep venous thrombosis    Hyperlipidemia    Hypertension    Left adrenal mass (HCC)    Meningioma (HCC)    Migraines    Neovascular glaucoma, indeterminate stage, left    Neuropathy    Obstructive sleep apnea    Osteoarthritis    Osteopenia    PONV (postoperative nausea and vomiting)     Past  Surgical History: Past Surgical History:  Procedure Laterality Date   ACHILLES TENDON REPAIR     BLEPHAROPLASTY UPPER EYELID      CARDIAC CATHETERIZATION  05/24/16   CORONARY ANGIOPLASTY     CORONARY ARTERY BYPASS GRAFT  2017   FINGER AMPUTATION Left    INSERTION AQUEOUS SHUNT      KNEE ARTHROSCOPY Left    LAPAROSCOPIC CHOLECYSTECTOMY     LENS EYE SURGERY      LUMBAR DISC SURGERY     l4-5   ROTATOR CUFF REPAIR Left    SHOULDER ARTHROSCOPY WITH SUBACROMIAL DECOMPRESSION, ROTATOR CUFF REPAIR AND BICEP TENDON REPAIR Right 12/17/2023   Procedure: RIGHT SHOULDER ARTHROSCOPY WITH DEBRIDEMENT, DECOMPRESSION, ROTATOR CUFF REPAIR, BICEPS TENODESIS, MANIPULATION UNDER ANESTHESIA.;  Surgeon: Elner Hahn, MD;  Location: [facility] ORS;  Service: Orthopedics;  Laterality: Right;   TIBIA FRACTURE SURGERY      Allergies: Allergies as of 04/29/2024 - Review Complete 04/29/2024  Allergen Reaction Noted   Rosuvastatin Other (See Comments) 10/16/2019   Augmentin [amoxicillin-pot clavulanate]  11/06/2021   Codeine  11/06/2021   Dilaudid [hydromorphone hcl]  11/06/2021    Medications: Outpatient Encounter Medications as of 04/29/2024  Medication Sig   acetaminophen  (TYLENOL ) 500 MG tablet Take 500 mg by mouth every 6 (six) hours as needed for moderate pain (pain score 4-6).   albuterol  (VENTOLIN  HFA) 108 (90 Base) MCG/ACT inhaler Inhale 2 puffs into the lungs every  4 (four) hours as needed.   aspirin EC 81 MG tablet Take 81 mg by mouth daily.   brimonidine  (ALPHAGAN ) 0.2 % ophthalmic solution Place 1 drop into both eyes 2 (two) times daily.   carvedilol  (COREG ) 3.125 MG tablet Take 1 tablet (3.125 mg total) by mouth 2 (two) times daily with a meal.   Continuous Blood Gluc Receiver (FREESTYLE LIBRE 14 DAY READER) DEVI Use as directed   Continuous Glucose Sensor (FREESTYLE LIBRE 14 DAY SENSOR) MISC for glucose monitoring   diazepam  (VALIUM ) 5 MG tablet Take 1 tablet (5 mg total) by mouth 30 minutes prior to  MRI scan. Then take additional tablet at time of MRI if needed for anxiety.   empagliflozin  (JARDIANCE ) 25 MG TABS tablet Take 1 tablet (25 mg total) by mouth daily with breakfast.   Evolocumab  (REPATHA  SURECLICK) 140 MG/ML SOAJ Inject 140 mg under the skin every 14 (fourteen) days.   furosemide  (LASIX ) 20 MG tablet Take 1 tablet (20 mg total) by mouth daily.   gabapentin  (NEURONTIN ) 300 MG capsule Take 1 capsule (300 mg total) by mouth 2 (two) times daily AND 3 capsules (900 mg total) at bedtime.   Insulin  Pen Needle (UNIFINE PENTIPS PLUS) 31G X 6 MM MISC use as directed   meclizine  (ANTIVERT ) 25 MG tablet Take 1 tablet (25 mg total) by mouth 3 (three) times daily as needed for dizziness   methocarbamol  (ROBAXIN ) 750 MG tablet Take 1 tablet (750 mg total) by mouth 2 (two) times daily as needed.   nitroGLYCERIN  (NITROSTAT ) 0.4 MG SL tablet Place 1 tablet (0.4 mg total) under the tongue every 5 (five) minutes as needed for chest pain. Max is 3 doses per incident.   omeprazole  (PRILOSEC) 40 MG capsule Take 1 capsule (40 mg total) by mouth 2 (two) times daily  before meals.   pioglitazone  (ACTOS ) 30 MG tablet Take 1 tablet (30 mg total) by mouth daily.   Semaglutide , 2 MG/DOSE, (OZEMPIC , 2 MG/DOSE,) 8 MG/3ML SOPN Inject 2 mg into the skin once a week.   spironolactone  (ALDACTONE ) 50 MG tablet Take 1 tablet (50 mg total) by mouth daily.   verapamil  (VERELAN ) 360 MG 24 hr capsule Take 1 capsule (360 mg total) by mouth at bedtime.   [DISCONTINUED] albuterol  (VENTOLIN  HFA) 108 (90 Base) MCG/ACT inhaler Inhale 2 puffs into the lungs every 4 (four) hours as needed. (Patient not taking: Reported on 04/23/2024)   [DISCONTINUED] brimonidine  (ALPHAGAN ) 0.2 % ophthalmic solution Place 1 drop into both eyes 2 (two) times daily. (Patient not taking: Reported on 04/23/2024)   [DISCONTINUED] Continuous Blood Gluc Sensor (FREESTYLE LIBRE 14 DAY SENSOR) MISC Inject 1 kit subcutaneously every 14 (fourteen) days (Patient not  taking: Reported on 04/23/2024)   [DISCONTINUED] empagliflozin  (JARDIANCE ) 25 MG TABS tablet Take 1 tablet (25 mg total) by mouth daily with breakfast. (Patient not taking: Reported on 04/23/2024)   [DISCONTINUED] meclizine  (ANTIVERT ) 25 MG tablet Take 1 tablet (25 mg total) by mouth 3 (three) times daily as needed for Dizziness (Patient not taking: Reported on 04/23/2024)   No facility-administered encounter medications on file as of 04/29/2024.    Social History: Social History   Tobacco Use   Smoking status: Former    Types: Cigarettes   Smokeless tobacco: Never  Vaping Use   Vaping status: Never Used  Substance Use Topics   Alcohol use: Never   Drug use: Not Currently    Family Medical History: Family History  Problem Relation Age of Onset  Breast cancer Mother    Heart attack Father    Hypertension Father    Breast cancer Sister    Heart disease Maternal Grandmother     Physical Examination: Vitals:   04/29/24 0853  BP: 118/70      Awake, alert, oriented to person, place, and time.  Speech is clear and fluent. Fund of knowledge is appropriate.   Cranial Nerves: Pupils equal round and reactive to light.  Facial tone is symmetric.    He has tenderness over occipital region on right with right trapezial tenderness.   No abnormal lesions on exposed skin.   Strength: Side Biceps Triceps Deltoid Interossei Grip Wrist Ext. Wrist Flex.  R 5 5 5 5 5  --- ---  L 5 5 5 5 5 5 5    Side Iliopsoas Quads Hamstring PF DF EHL  R 5 5 5 5 5 5   L 5 5 5 5 5 5    Pain with deltoid testing on right, no gross weakness.   He is missing half of his ring finger on his left hand.   Sensation intact to light touch in bilateral upper extremities.   Reflexes are 2+ and symmetric at the biceps, brachioradialis, patella and achilles.   Hoffman's is \\absent  Clonus is not present.    Gait is slow.   Medical Decision Making  Imaging: Narrative & Impression  CLINICAL DATA:  Neck pain,  chronic, degenerative changes on xray   EXAM: MRI CERVICAL SPINE WITHOUT CONTRAST   TECHNIQUE: Multiplanar, multisequence MR imaging of the cervical spine was performed. No intravenous contrast was administered.   COMPARISON:  MR C Spine 11/09/20   FINDINGS: Alignment: Physiologic.   Vertebrae: No fracture, evidence of discitis, or bone lesion.   Cord: Normal signal and morphology.   Posterior Fossa, vertebral arteries, paraspinal tissues: Negative.   Disc levels:   Limitations: Motion degraded exam   C1-C2: Mild degenerative change.   C2-C3: Mild bilateral facet degenerative change. No spinal canal or neural foraminal narrowing.   C3-C4: Mild bilateral facet degenerative change. Uncovertebral hypertrophy. Mild right neural foraminal narrowing. No spinal canal narrowing.   C4-C5: Mild bilateral facet degenerative change. Uncovertebral hypertrophy. Mild bilateral neural foraminal narrowing. No spinal canal narrowing.   C5-C6: Mild bilateral facet degenerative change. Uncovertebral hypertrophy. Mild bilateral neural foraminal narrowing. No spinal canal narrowing.   C6-C7: Circumferential disc bulge. Mild spinal canal narrowing. Uncovertebral hypertrophy. Mild bilateral neural foraminal narrowing, right-greater-than-left.   C7-T1: Mild bilateral facet degenerative change. No spinal canal or neural foraminal narrowing.   IMPRESSION: The degree of motion artifact limits accurate assessment of the degree of spinal canal or neural foraminal stenosis.   1. Mild multilevel degenerative changes without evidence of high-grade spinal canal narrowing.   2. There is mild neural foraminal narrowing at C3-C4 (right) and C4-C7 (bilateral).     Electronically Signed   By: Clora Dane M.D.   On: 10/10/2023 12:55   Narrative & Impression  CLINICAL DATA:  neck pain   EXAM: CERVICAL SPINE - COMPLETE 4+ VIEW   COMPARISON:  None Available.   FINDINGS: The cervical  spine is visualized from C1-the superior endplate of C7. Cervical alignment is maintained. No evidence of dynamic instability on limited excursion flexion and extension views. Vertebral body heights are maintained: no evidence of acute fracture. Relative preservation of the disc spaces with flowing anterior osteophytes. No prevertebral soft tissue swelling. LEFT-sided carotid calcifications.   IMPRESSION: 1. No acute fracture or traumatic listhesis. 2. Flowing anterior  osteophytes which can be seen in the setting of diffuse idiopathic skeletal hyperostosis.     Electronically Signed   By: Clancy Crimes M.D.   On: 10/12/2023 14:14     I reviewed his imaging, demonstrates multilevel anterior osteophytes, his posterior anatomy is mild at worst, shows some very mild stenosis in his bilateral neuroforamina.  Assessment and Plan: Mr. Silmon had right shoulder scope with rotator cuff repair by Dr. Daun Epstein on 12/17/23. He continues with pain and stiffness in the right shoulder.  He states that he has had worsening stiffness in most of his joints but in his neck specifically.  When he turns his head to the side too far causes pain.  He does not get any radiating pain down into his arms or legs. He has no radicular component, his exam is nonfocal.  Imaging demonstrates multilevel osteophytic bridges in the anterior cervical spine, review of his thoracolumbar spine from other images shows a similar process.  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  He does not have a rheumatologist, would consider a referral to rheumatology for further evaluation.  We did discuss neck surgery, he is in positive balance and would potentially be a candidate for cervical deformity correction, however I did warn him that a procedure such as this would not improve his range of motion and would likely rather decrease it slightly.  In some cases it can decrease it a significant amount, however  with his previous autofusion I do not see that being a major worsening but ensured him that it would not improve.  At this point he would like to consider conservative management including injections or radiofrequency type ablations for his cervical spine as he has had previous injections and interventions on his lumbar spine and have good outcomes.  Will plan to send a message over to Dr. Rhesa Celeste as well as  Neck pain likely from cervical spondylosis. Right shoulder is likely contributing to his pain as well. May have component of right occipital neuritis as well.  Dr. Clementine Cutting to discuss the plan.  Carroll Clamp, MD [facility]

## 2024-04-29 ENCOUNTER — Encounter: Payer: Self-pay | Admitting: Neurosurgery

## 2024-04-29 ENCOUNTER — Ambulatory Visit (INDEPENDENT_AMBULATORY_CARE_PROVIDER_SITE_OTHER): Admitting: Neurosurgery

## 2024-04-29 ENCOUNTER — Other Ambulatory Visit: Payer: Self-pay

## 2024-04-29 VITALS — BP 118/70 | Ht 72.0 in | Wt 210.0 lb

## 2024-04-29 DIAGNOSIS — M25511 Pain in right shoulder: Secondary | ICD-10-CM

## 2024-04-29 DIAGNOSIS — M4810 Ankylosing hyperostosis [Forestier], site unspecified: Secondary | ICD-10-CM | POA: Diagnosis not present

## 2024-04-29 DIAGNOSIS — M47812 Spondylosis without myelopathy or radiculopathy, cervical region: Secondary | ICD-10-CM | POA: Diagnosis not present

## 2024-04-29 MED ORDER — DIAZEPAM 5 MG PO TABS
5.0000 mg | ORAL_TABLET | ORAL | 0 refills | Status: DC
  Filled 2024-04-29: qty 2, 1d supply, fill #0

## 2024-04-30 ENCOUNTER — Other Ambulatory Visit: Payer: Self-pay | Admitting: Neurosurgery

## 2024-04-30 ENCOUNTER — Other Ambulatory Visit: Payer: Self-pay | Admitting: Family Medicine

## 2024-04-30 DIAGNOSIS — M75121 Complete rotator cuff tear or rupture of right shoulder, not specified as traumatic: Secondary | ICD-10-CM

## 2024-04-30 DIAGNOSIS — M7501 Adhesive capsulitis of right shoulder: Secondary | ICD-10-CM

## 2024-04-30 DIAGNOSIS — M4810 Ankylosing hyperostosis [Forestier], site unspecified: Secondary | ICD-10-CM

## 2024-04-30 DIAGNOSIS — Z9889 Other specified postprocedural states: Secondary | ICD-10-CM

## 2024-04-30 DIAGNOSIS — M7581 Other shoulder lesions, right shoulder: Secondary | ICD-10-CM

## 2024-04-30 NOTE — Progress Notes (Signed)
 I was able to discuss his care with his primary care provider Dr. Clementine Cutting, they agreed that a rheumatology consult would be helpful in his case.  Stated that the current wait time in their system is over a year for rheumatology, I will plan to make an internal referral here in the [facility] system.

## 2024-05-01 ENCOUNTER — Ambulatory Visit
Admission: RE | Admit: 2024-05-01 | Discharge: 2024-05-01 | Disposition: A | Discharge status: Home / Self Care | Source: Ambulatory Visit | Attending: Family Medicine | Admitting: Family Medicine

## 2024-05-01 DIAGNOSIS — M7581 Other shoulder lesions, right shoulder: Secondary | ICD-10-CM | POA: Insufficient documentation

## 2024-05-01 DIAGNOSIS — M7501 Adhesive capsulitis of right shoulder: Secondary | ICD-10-CM | POA: Diagnosis not present

## 2024-05-01 DIAGNOSIS — M75101 Unspecified rotator cuff tear or rupture of right shoulder, not specified as traumatic: Secondary | ICD-10-CM | POA: Diagnosis not present

## 2024-05-01 DIAGNOSIS — Z9889 Other specified postprocedural states: Secondary | ICD-10-CM | POA: Insufficient documentation

## 2024-05-01 DIAGNOSIS — M19011 Primary osteoarthritis, right shoulder: Secondary | ICD-10-CM | POA: Diagnosis not present

## 2024-05-01 DIAGNOSIS — M75121 Complete rotator cuff tear or rupture of right shoulder, not specified as traumatic: Secondary | ICD-10-CM | POA: Insufficient documentation

## 2024-05-01 DIAGNOSIS — M948X1 Other specified disorders of cartilage, shoulder: Secondary | ICD-10-CM | POA: Diagnosis not present

## 2024-05-05 ENCOUNTER — Ambulatory Visit: Admitting: Physician Assistant

## 2024-05-05 ENCOUNTER — Other Ambulatory Visit (HOSPITAL_COMMUNITY): Payer: Self-pay

## 2024-05-05 ENCOUNTER — Other Ambulatory Visit: Payer: Self-pay

## 2024-05-12 ENCOUNTER — Ambulatory Visit: Admitting: Student in an Organized Health Care Education/Training Program

## 2024-05-12 DIAGNOSIS — L600 Ingrowing nail: Secondary | ICD-10-CM | POA: Diagnosis not present

## 2024-05-12 DIAGNOSIS — E1140 Type 2 diabetes mellitus with diabetic neuropathy, unspecified: Secondary | ICD-10-CM | POA: Diagnosis not present

## 2024-05-12 DIAGNOSIS — M7672 Peroneal tendinitis, left leg: Secondary | ICD-10-CM | POA: Diagnosis not present

## 2024-05-12 DIAGNOSIS — B351 Tinea unguium: Secondary | ICD-10-CM | POA: Diagnosis not present

## 2024-05-12 DIAGNOSIS — Z794 Long term (current) use of insulin: Secondary | ICD-10-CM | POA: Diagnosis not present

## 2024-05-13 ENCOUNTER — Ambulatory Visit: Admitting: Student in an Organized Health Care Education/Training Program

## 2024-05-13 DIAGNOSIS — H4053X4 Glaucoma secondary to other eye disorders, bilateral, indeterminate stage: Secondary | ICD-10-CM | POA: Diagnosis not present

## 2024-05-13 DIAGNOSIS — H53413 Scotoma involving central area, bilateral: Secondary | ICD-10-CM | POA: Diagnosis not present

## 2024-05-13 DIAGNOSIS — H5371 Glare sensitivity: Secondary | ICD-10-CM | POA: Diagnosis not present

## 2024-05-14 ENCOUNTER — Ambulatory Visit: Admitting: Student in an Organized Health Care Education/Training Program

## 2024-05-19 ENCOUNTER — Other Ambulatory Visit: Payer: Self-pay

## 2024-05-20 ENCOUNTER — Ambulatory Visit
Attending: Student in an Organized Health Care Education/Training Program | Admitting: Student in an Organized Health Care Education/Training Program

## 2024-05-20 ENCOUNTER — Ambulatory Visit
Admission: RE | Admit: 2024-05-20 | Discharge: 2024-05-20 | Disposition: A | Discharge status: Home / Self Care | Source: Ambulatory Visit | Attending: Student in an Organized Health Care Education/Training Program | Admitting: Student in an Organized Health Care Education/Training Program

## 2024-05-20 VITALS — BP 115/67 | HR 77 | Temp 97.6°F | Resp 16 | Ht 72.0 in | Wt 200.0 lb

## 2024-05-20 DIAGNOSIS — M5481 Occipital neuralgia: Secondary | ICD-10-CM | POA: Insufficient documentation

## 2024-05-20 DIAGNOSIS — M5412 Radiculopathy, cervical region: Secondary | ICD-10-CM | POA: Insufficient documentation

## 2024-05-20 HISTORY — DX: Occipital neuralgia: M54.81

## 2024-05-20 MED ORDER — SODIUM CHLORIDE (PF) 0.9 % IJ SOLN
INTRAMUSCULAR | Status: AC
  Filled 2024-05-20: qty 10

## 2024-05-20 MED ORDER — SODIUM CHLORIDE 0.9% FLUSH
1.0000 mL | Freq: Once | INTRAVENOUS | Status: AC
  Administered 2024-05-20: 1 mL

## 2024-05-20 MED ORDER — LIDOCAINE HCL 2 % IJ SOLN
INTRAMUSCULAR | Status: AC
  Filled 2024-05-20: qty 20

## 2024-05-20 MED ORDER — IOHEXOL 180 MG/ML  SOLN
INTRAMUSCULAR | Status: AC
  Filled 2024-05-20: qty 20

## 2024-05-20 MED ORDER — ROPIVACAINE HCL 2 MG/ML IJ SOLN
1.0000 mL | Freq: Once | INTRAMUSCULAR | Status: AC
  Administered 2024-05-20: 1 mL via EPIDURAL

## 2024-05-20 MED ORDER — IOHEXOL 180 MG/ML  SOLN
10.0000 mL | Freq: Once | INTRAMUSCULAR | Status: AC
  Administered 2024-05-20: 10 mL via EPIDURAL

## 2024-05-20 MED ORDER — DEXAMETHASONE SODIUM PHOSPHATE 10 MG/ML IJ SOLN
10.0000 mg | Freq: Once | INTRAMUSCULAR | Status: AC
  Administered 2024-05-20: 10 mg

## 2024-05-20 MED ORDER — ROPIVACAINE HCL 2 MG/ML IJ SOLN
INTRAMUSCULAR | Status: AC
  Filled 2024-05-20: qty 20

## 2024-05-20 MED ORDER — LIDOCAINE HCL 2 % IJ SOLN
20.0000 mL | Freq: Once | INTRAMUSCULAR | Status: AC
  Administered 2024-05-20: 100 mg

## 2024-05-20 MED ORDER — DEXAMETHASONE SODIUM PHOSPHATE 10 MG/ML IJ SOLN
INTRAMUSCULAR | Status: AC
  Filled 2024-05-20: qty 2

## 2024-05-20 NOTE — Progress Notes (Signed)
 Safety precautions to be maintained throughout the outpatient stay will include: orient to surroundings, keep bed in low position, maintain call bell within reach at all times, provide assistance with transfer out of bed and ambulation.

## 2024-05-20 NOTE — Progress Notes (Signed)
 PROVIDER NOTE: Interpretation of information contained herein should be left to medically-trained personnel. Specific patient instructions are provided elsewhere under "Patient Instructions" section of medical record. This document was created in part using STT-dictation technology, any transcriptional errors that may result from this process are unintentional.  Patient: Robert Fernandez Type: Established DOB: 03-Nov-1956 MRN: [id number] PCP: Damian Duke, MD  Service: Procedure DOS: 05/20/2024 Setting: Ambulatory Location: Ambulatory outpatient facility Delivery: Face-to-face Provider: Cephus Collin, MD Specialty: Interventional Pain Management Specialty designation: 09 Location: Outpatient facility Ref. Prov.: Damian Duke, MD       Interventional Therapy   Procedure: Cervical Epidural Steroid injection (CESI) (Interlaminar) #2  Laterality: Right  Level: C7-T1 Imaging: Fluoroscopy-assisted DOS: 05/20/2024  Performed by: Cephus Collin, MD Anesthesia: Local anesthesia (1-2% Lidocaine ) Sedation:                           Purpose: Diagnostic/Therapeutic Indications: Cervicalgia, cervical radicular pain, degenerative disc disease, severe enough to impact quality of life or function. 1. Cervical radicular pain   2. Bilateral occipital neuralgia    NAS-11 score:   Pre-procedure: 8 /10   Post-procedure: 6 /10      Position  Prep  Materials:  Location setting: Procedure suite Position: Prone, on modified reverse trendelenburg to facilitate breathing, with head in head-cradle. Pillows positioned under chest (below chin-level) with cervical spine flexed. Safety Precautions: Patient was assessed for positional comfort and pressure points before starting the procedure. Prepping solution: DuraPrep (Iodine Povacrylex [0.7% available iodine] and Isopropyl Alcohol, 74% w/w) Prep Area: Entire  cervicothoracic region Approach: percutaneous, paramedial Intended target: Posterior  cervical epidural space Materials Procedure:  Tray: Epidural Needle(s): Epidural (Tuohy) Qty: 1 Length: (90mm) 3.5-inch Gauge: 22G   H&P (Pre-op Assessment):  Robert Fernandez is a 68 y.o. (year old), male patient, seen today for interventional treatment. He  has a past surgical history that includes Cardiac catheterization (05/24/16); Coronary artery bypass graft (2017); Lumbar disc surgery; Laparoscopic cholecystectomy; Rotator cuff repair (Left); Tibia fracture surgery; Coronary angioplasty; Knee arthroscopy (Left); LENS EYE SURGERY ; INSERTION AQUEOUS SHUNT ; Achilles tendon repair; BLEPHAROPLASTY UPPER EYELID ; Finger amputation (Left); and Shoulder arthroscopy with subacromial decompression, rotator cuff repair and bicep tendon repair (Right, 12/17/2023). Robert Fernandez has a current medication list which includes the following prescription(s): acetaminophen , albuterol , aspirin ec, brimonidine , carvedilol , freestyle libre 14 day reader, freestyle libre 14 day sensor, diazepam , jardiance , repatha  sureclick, furosemide , gabapentin , unifine pentips plus, meclizine , methocarbamol , nitroglycerin , omeprazole , pioglitazone , ozempic  (2 mg/dose), spironolactone , and verapamil . His primarily concern today is the Neck Pain  Initial Vital Signs:  Pulse/HCG Rate: 77ECG Heart Rate: 81 Temp: 97.6 F (36.4 C) Resp: 16 BP: 115/74 SpO2: 100 %  BMI: Estimated body mass index is 27.12 kg/m as calculated from the following:   Height as of this encounter: 6' (1.829 m).   Weight as of this encounter: 200 lb (90.7 kg).  Risk Assessment: Allergies: Reviewed. He is allergic to rosuvastatin, augmentin [amoxicillin-pot clavulanate], codeine, and dilaudid [hydromorphone hcl].  Allergy Precautions: None required Coagulopathies: Reviewed. None identified.  Blood-thinner therapy: None at this time Active Infection(s): Reviewed. None identified. Robert Fernandez is afebrile  Site Confirmation: Robert Fernandez was asked to  confirm the procedure and laterality before marking the site Procedure checklist: Completed Consent: Before the procedure and under the influence of no sedative(s), amnesic(s), or anxiolytics, the patient was informed of the treatment options, risks and possible complications. To fulfill our ethical and legal  obligations, as recommended by the American Medical Association's Code of Ethics, I have informed the patient of my clinical impression; the nature and purpose of the treatment or procedure; the risks, benefits, and possible complications of the intervention; the alternatives, including doing nothing; the risk(s) and benefit(s) of the alternative treatment(s) or procedure(s); and the risk(s) and benefit(s) of doing nothing. The patient was provided information about the general risks and possible complications associated with the procedure. These may include, but are not limited to: failure to achieve desired goals, infection, bleeding, organ or nerve damage, allergic reactions, paralysis, and death. In addition, the patient was informed of those risks and complications associated to Spine-related procedures, such as failure to decrease pain; infection (i.e.: Meningitis, epidural or intraspinal abscess); bleeding (i.e.: epidural hematoma, subarachnoid hemorrhage, or any other type of intraspinal or peri-dural bleeding); organ or nerve damage (i.e.: Any type of peripheral nerve, nerve root, or spinal cord injury) with subsequent damage to sensory, motor, and/or autonomic systems, resulting in permanent pain, numbness, and/or weakness of one or several areas of the body; allergic reactions; (i.e.: anaphylactic reaction); and/or death. Furthermore, the patient was informed of those risks and complications associated with the medications. These include, but are not limited to: allergic reactions (i.e.: anaphylactic or anaphylactoid reaction(s)); adrenal axis suppression; blood sugar elevation that in diabetics  may result in ketoacidosis or comma; water retention that in patients with history of congestive heart failure may result in shortness of breath, pulmonary edema, and decompensation with resultant heart failure; weight gain; swelling or edema; medication-induced neural toxicity; particulate matter embolism and blood vessel occlusion with resultant organ, and/or nervous system infarction; and/or aseptic necrosis of one or more joints. Finally, the patient was informed that Medicine is not an exact science; therefore, there is also the possibility of unforeseen or unpredictable risks and/or possible complications that may result in a catastrophic outcome. The patient indicated having understood very clearly. We have given the patient no guarantees and we have made no promises. Enough time was given to the patient to ask questions, all of which were answered to the patient's satisfaction. Mr. Moncrief has indicated that he wanted to continue with the procedure. Attestation: I, the ordering provider, attest that I have discussed with the patient the benefits, risks, side-effects, alternatives, likelihood of achieving goals, and potential problems during recovery for the procedure that I have provided informed consent. Date  Time: 05/20/2024  1:46 PM   Pre-Procedure Preparation:  Monitoring: As per clinic protocol. Respiration, ETCO2, SpO2, BP, heart rate and rhythm monitor placed and checked for adequate function Safety Precautions: Patient was assessed for positional comfort and pressure points before starting the procedure. Time-out: I initiated and conducted the "Time-out" before starting the procedure, as per protocol. The patient was asked to participate by confirming the accuracy of the "Time Out" information. Verification of the correct person, site, and procedure were performed and confirmed by me, the nursing staff, and the patient. "Time-out" conducted as per Joint Commission's Universal Protocol  (UP.01.01.01). Time: 1441 Start Time: 1441 hrs.  Description  Narrative of Procedure:          Rationale (medical necessity): procedure needed and proper for the diagnosis and/or treatment of the patient's medical symptoms and needs. Start Time: 1441 hrs. Safety Precautions: Aspiration looking for blood return was conducted prior to all injections. At no point did we inject any substances, as a needle was being advanced. No attempts were made at seeking any paresthesias. Safe injection practices and needle  disposal techniques used. Medications properly checked for expiration dates. SDV (single dose vial) medications used. Description of procedure: Protocol guidelines were followed. The patient was assisted into a comfortable position. The target area was identified and the area prepped in the usual manner. Skin & deeper tissues infiltrated with local anesthetic. Appropriate amount of time allowed to pass for local anesthetics to take effect. Using fluoroscopic guidance, the epidural needle was introduced through the skin, ipsilateral to the reported pain, and advanced to the target area. Posterior laminar os was contacted and the needle walked caudad, until the lamina was cleared. The ligamentum flavum was engaged and the epidural space identified using "loss-of-resistance technique" with 2-3 ml of PF-NaCl (0.9% NSS), in a 5cc dedicated LOR syringe. (See "Imaging guidance" below for use of contrast details.) Once proper needle placement was secured, and negative aspiration confirmed, the solution was injected in intermittent fashion, asking for systemic symptoms every 0.5cc. The needles were then removed and the area cleansed, making sure to leave some of the prepping solution back to take advantage of its long term bactericidal properties.  3 cc solution made of 1 cc of preservative-free saline, 1 cc of 0.2% ropivacaine , 1 cc of Decadron  10 mg/cc.   Vitals:   05/20/24 1437 05/20/24 1443 05/20/24 1446  05/20/24 1458  BP: 113/84 127/82 116/78 115/67  Pulse:      Resp: 15 18 16 16   Temp:      SpO2: 96% 95% 95% 98%  Weight:      Height:         End Time: 1445 hrs.  Imaging Guidance (Spinal):          Type of Imaging Technique: Fluoroscopy Guidance (Spinal) Indication(s): Fluoroscopy guidance for needle placement to enhance accuracy in procedures requiring precise needle localization for targeted delivery of medication in or near specific anatomical locations not easily accessible without such real-time imaging assistance. Exposure Time: Please see nurses notes. Contrast: Before injecting any contrast, we confirmed that the patient did not have an allergy to iodine, shellfish, or radiological contrast. Once satisfactory needle placement was completed at the desired level, radiological contrast was injected. Contrast injected under live fluoroscopy. No contrast complications. See chart for type and volume of contrast used. Fluoroscopic Guidance: I was personally present during the use of fluoroscopy. "Tunnel Vision Technique" used to obtain the best possible view of the target area. Parallax error corrected before commencing the procedure. "Direction-depth-direction" technique used to introduce the needle under continuous pulsed fluoroscopy. Once target was reached, antero-posterior, oblique, and lateral fluoroscopic projection used confirm needle placement in all planes. Images permanently stored in EMR. Interpretation: I personally interpreted the imaging intraoperatively. Adequate needle placement confirmed in multiple planes. Appropriate spread of contrast into desired area was observed. No evidence of afferent or efferent intravascular uptake. No intrathecal or subarachnoid spread observed. Permanent images saved into the patient's record.  Post-operative Assessment:  Post-procedure Vital Signs:  Pulse/HCG Rate: 7780 Temp: 97.6 F (36.4 C) Resp: 16 BP: 115/67 SpO2: 98 %  EBL:  None  Complications: No immediate post-treatment complications observed by team, or reported by patient.  Note: The patient tolerated the entire procedure well. A repeat set of vitals were taken after the procedure and the patient was kept under observation following institutional policy, for this type of procedure. Post-procedural neurological assessment was performed, showing return to baseline, prior to discharge. The patient was provided with post-procedure discharge instructions, including a section on how to identify potential problems. Should any problems  arise concerning this procedure, the patient was given instructions to immediately contact us , at any time, without hesitation. In any case, we plan to contact the patient by telephone for a follow-up status report regarding this interventional procedure.  Comments:  No additional relevant information.  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 Position: Prone Imaging guidance: Fluoroscopy required. Contrast required unless contraindicated by allergy or severe CKD. Equipment & Materials: Epidural tray & needle.    Scheduling Instructions:     Procedure: Cervical Epidural Steroid Injection/Block     Planned Level(s): C7-T1     Laterality: TBD     Anxiolysis: Patient's choice.     Timeframe: Today    Where will this procedure be performed?:   [facility]             Lirio Bach   GREATER OCCIPITAL NERVE BLOCK    Scheduling Instructions:     Procedure: Occipital nerve block     Laterality: Bilateral     Sedation: Patient's choice.     Timeframe: Today    Where will this procedure be performed?:   [facility]   [facility] C-ARM 1-60 MIN NO REPORT    Intraoperative interpretation by procedural physician at [facility].    Standing Status:   Standing     Number of Occurrences:   1    Reason for exam::   Assistance in needle guidance and placement for procedures requiring needle placement in or near specific anatomical locations not easily accessible without such assistance.     Medications ordered for procedure: Meds ordered this encounter  Medications   iohexol  (OMNIPAQUE ) 180 MG/ML injection 10 mL    Must be Myelogram-compatible. If not available, you may substitute with a water-soluble, non-ionic, hypoallergenic, myelogram-compatible radiological contrast medium.   lidocaine  (XYLOCAINE ) 2 % (with pres) injection 400 mg   ropivacaine  (PF) 2 mg/mL (0.2%) (NAROPIN ) injection 1 mL   sodium chloride  flush (NS) 0.9 % injection 1 mL   dexamethasone  (DECADRON ) injection 10 mg   dexamethasone  (DECADRON ) injection 10 mg   Medications administered: We administered iohexol , lidocaine , ropivacaine  (PF) 2 mg/mL (0.2%), sodium chloride  flush, dexamethasone , and dexamethasone .  See the medical record for exact dosing, route, and time of administration.  Follow-up plan:   Return in about 5 weeks (around 06/24/2024), or F2F PPE.       Recent Visits No visits were found meeting these conditions. Showing recent visits within past 90 days and meeting all other requirements Today's Visits Date Type Provider Dept  05/20/24 Procedure visit Cephus Collin, MD [facility]  Showing today's visits and meeting all other requirements Future Appointments No visits were found meeting these conditions. Showing future appointments within next 90 days and meeting all other requirements  Disposition: Discharge home  Discharge (Date  Time): 05/20/2024; 1455 hrs.   Primary Care Physician: Damian Duke, MD Location: [facility] Note by: Cephus Collin, MD (TTS technology used. I apologize for any typographical errors that were not detected and corrected.) Date: 05/20/2024; Time: 3:05 PM  Disclaimer:  Medicine is  not an Visual merchandiser. The only guarantee in medicine is that nothing is guaranteed. It is important to note that the decision to proceed with this intervention was based on the information collected from the patient. The Data and conclusions were drawn  from the patient's questionnaire, the interview, and the physical examination. Because the information was provided in large part by the patient, it cannot be guaranteed that it has not been purposely or unconsciously manipulated. Every effort has been made to obtain as much relevant data as possible for this evaluation. It is important to note that the conclusions that lead to this procedure are derived in large part from the available data. Always take into account that the treatment will also be dependent on availability of resources and existing treatment guidelines, considered by other Pain Management Practitioners as being common knowledge and practice, at the time of the intervention. For Medico-Legal purposes, it is also important to point out that variation in procedural techniques and pharmacological choices are the acceptable norm. The indications, contraindications, technique, and results of the above procedure should only be interpreted and judged by a Board-Certified Interventional Pain Specialist with extensive familiarity and expertise in the same exact procedure and technique.

## 2024-05-20 NOTE — Patient Instructions (Signed)
 ______________________________________________________________________    Post-Procedure Discharge Instructions  Instructions: Apply ice:  Purpose: This will minimize any swelling and discomfort after procedure.  When: Day of procedure, as soon as you get home. How: Fill a plastic sandwich bag with crushed ice. Cover it with a small towel and apply to injection site. How long: (15 min on, 15 min off) Apply for 15 minutes then remove x 15 minutes.  Repeat sequence on day of procedure, until you go to bed. Apply heat:  Purpose: To treat any soreness and discomfort from the procedure. When: Starting the next day after the procedure. How: Apply heat to procedure site starting the day following the procedure. How long: May continue to repeat daily, until discomfort goes away. Food intake: Start with clear liquids (like water) and advance to regular food, as tolerated.  Physical activities: Keep activities to a minimum for the first 8 hours after the procedure. After that, then as tolerated. Driving: If you have received any sedation, be responsible and do not drive. You are not allowed to drive for 24 hours after having sedation. Blood thinner: (Applies only to those taking blood thinners) You may restart your blood thinner 6 hours after your procedure. Insulin : (Applies only to Diabetic patients taking insulin ) As soon as you can eat, you may resume your normal dosing schedule. Infection prevention: Keep procedure site clean and dry. Shower daily and clean area with soap and water. Post-procedure Pain Diary: Extremely important that this be done correctly and accurately. Recorded information will be used to determine the next step in treatment. For the purpose of accuracy, follow these rules: Evaluate only the area treated. Do not report or include pain from an untreated area. For the purpose of this evaluation, ignore all other areas of pain, except for the treated area. After your procedure,  avoid taking a long nap and attempting to complete the pain diary after you wake up. Instead, set your alarm clock to go off every hour, on the hour, for the initial 8 hours after the procedure. Document the duration of the numbing medicine, and the relief you are getting from it. Do not go to sleep and attempt to complete it later. It will not be accurate. If you received sedation, it is likely that you were given a medication that may cause amnesia. Because of this, completing the diary at a later time may cause the information to be inaccurate. This information is needed to plan your care. Follow-up appointment: Keep your post-procedure follow-up evaluation appointment after the procedure (usually 2 weeks for most procedures, 6 weeks for radiofrequencies). DO NOT FORGET to bring you pain diary with you.   Expect: (What should I expect to see with my procedure?) From numbing medicine (AKA: Local Anesthetics): Numbness or decrease in pain. You may also experience some weakness, which if present, could last for the duration of the local anesthetic. Onset: Full effect within 15 minutes of injected. Duration: It will depend on the type of local anesthetic used. On the average, 1 to 8 hours.  From steroids (Applies only if steroids were used): Decrease in swelling or inflammation. Once inflammation is improved, relief of the pain will follow. Onset of benefits: Depends on the amount of swelling present. The more swelling, the longer it will take for the benefits to be seen. In some cases, up to 10 days. Duration: Steroids will stay in the system x 2 weeks. Duration of benefits will depend on multiple posibilities including persistent irritating factors. Side-effects: If  present, they may typically last 2 weeks (the duration of the steroids). Frequent: Cramps (if they occur, drink Gatorade and take over-the-counter Magnesium 450-500 mg once to twice a day); water retention with temporary weight gain;  increases in blood sugar; decreased immune system response; increased appetite. Occasional: Facial flushing (red, warm cheeks); mood swings; menstrual changes. Uncommon: Long-term decrease or suppression of natural hormones; bone thinning. (These are more common with higher doses or more frequent use. This is why we prefer that our patients avoid having any injection therapies in other practices.)  Very Rare: Severe mood changes; psychosis; aseptic necrosis. From procedure: Some discomfort is to be expected once the numbing medicine wears off. This should be minimal if ice and heat are applied as instructed.  Call if: (When should I call?) You experience numbness and weakness that gets worse with time, as opposed to wearing off. New onset bowel or bladder incontinence. (Applies only to procedures done in the spine)  Emergency Numbers: Durning business hours (Monday - Thursday, 8:00 AM - 4:00 PM) (Friday, 9:00 AM - 12:00 Noon): [phone] After hours: [phone] NOTE: If you are having a problem and are unable connect with, or to talk to a provider, then go to your nearest urgent care or emergency department. If the problem is serious and urgent, please call 911. ______________________________________________________________________     Occipital Nerve Block Patient Information  Description: The occipital nerves originate in the cervical (neck) spinal cord and travel upward through muscle and tissue to supply sensation to the back of the head and top of the scalp.  In addition, the nerves control some of the muscles of the scalp.  Occipital neuralgia is an irritation of these nerves which can cause headaches, numbness of the scalp, and neck discomfort.     The occipital nerve block will interrupt nerve transmission through these nerves and can relieve pain and spasm.  The block consists of insertion of a small needle under the skin in the back of the head to deposit local anesthetic  (numbing medicine) and/or steroids around the nerve.  The entire block usually lasts less than 5 minutes.  Conditions which may be treated by occipital blocks:  Muscular pain and spasm of the scalp Nerve irritation, back of the head Headaches Upper neck pain  Preparation for the injection:  Do not eat any solid food or dairy products within 8 hours of your appointment. You may drink clear liquids up to 3 hours before appointment.  Clear liquids include water, black coffee, juice or soda.  No milk or cream please. You may take your regular medication, including pain medications, with a sip of water before you appointment.  Diabetics should hold regular insulin  (if taken separately) and take 1/2 normal NPH dose the morning of the procedure.  Carry some sugar containing items with you to your appointment. A driver must accompany you and be prepared to drive you home after your procedure. Bring all your current medications with you. An IV may be inserted and sedation may be given at the discretion of the physician. A blood pressure cuff, EKG, and other monitors will often be applied during the procedure.  Some patients may need to have extra oxygen administered for a short period. You will be asked to provide medical information, including your allergies and medications, prior to the procedure.  We must know immediately if you are taking blood thinners (like Coumadin/Warfarin) or if you are allergic to IV iodine contrast (dye).  We must  know if you could possible be pregnant.  Do not wear a high collared shirt or turtleneck.  Tie long hair up in the back if possible.  Possible side-effects:  Bleeding from needle site Infection (rare, may require surgery) Nerve injury (rare) Hair on back of neck can be tinged with iodine scrub (this will wash out) Light-headedness (temporary) Pain at injection site (several days) Decreased blood pressure (rare, temporary) Seizure (very rare)  Call if you  experience:  Hives or difficulty breathing ( go to the emergency room) Inflammation or drainage at the injection site(s)  Please note:  Although the local anesthetic injected can often make your painful muscles or headache feel good for several hours after the injection, the pain may return.  It takes 3-7 days for steroids to work.  You may not notice any pain relief for at least one week.  If effective, we will often do a series of injections spaced 3-6 weeks apart to maximally decrease your pain.  If you have any questions, please call [phone] [facility] Epidural Steroid Injection Patient Information  Description: The epidural space surrounds the nerves as they exit the spinal cord.  In some patients, the nerves can be compressed and inflamed by a bulging disc or a tight spinal canal (spinal stenosis).  By injecting steroids into the epidural space, we can bring irritated nerves into direct contact with a potentially helpful medication.  These steroids act directly on the irritated nerves and can reduce swelling and inflammation which often leads to decreased pain.  Epidural steroids may be injected anywhere along the spine and from the neck to the low back depending upon the location of your pain.   After numbing the skin with local anesthetic (like Novocaine), a small needle is passed into the epidural space slowly.  You may experience a sensation of pressure while this is being done.  The entire block usually last less than 10 minutes.  Conditions which may be treated by epidural steroids:  Low back and leg pain Neck and arm pain Spinal stenosis Post-laminectomy syndrome Herpes zoster (shingles) pain Pain from compression fractures  Preparation for the injection:  Do not eat any solid food or dairy products within 8 hours of your appointment.  You may drink clear liquids up to 3 hours before appointment.  Clear liquids include water, black  coffee, juice or soda.  No milk or cream please. You may take your regular medication, including pain medications, with a sip of water before your appointment  Diabetics should hold regular insulin  (if taken separately) and take 1/2 normal NPH dos the morning of the procedure.  Carry some sugar containing items with you to your appointment. A driver must accompany you and be prepared to drive you home after your procedure.  Bring all your current medications with your. An IV may be inserted and sedation may be given at the discretion of the physician.   A blood pressure cuff, EKG and other monitors will often be applied during the procedure.  Some patients may need to have extra oxygen administered for a short period. You will be asked to provide medical information, including your allergies, prior to the procedure.  We must know immediately if you are taking blood thinners (like Coumadin/Warfarin)  Or if you are allergic to IV iodine contrast (dye). We must know if you could possible be pregnant.  Possible side-effects: Bleeding from needle site Infection (rare, may require surgery) Nerve injury (rare) Numbness &  tingling (temporary) Difficulty urinating (rare, temporary) Spinal headache ( a headache worse with upright posture) Light -headedness (temporary) Pain at injection site (several days) Decreased blood pressure (temporary) Weakness in arm/leg (temporary) Pressure sensation in back/neck (temporary)  Call if you experience: Fever/chills associated with headache or increased back/neck pain. Headache worsened by an upright position. New onset weakness or numbness of an extremity below the injection site Hives or difficulty breathing (go to the emergency room) Inflammation or drainage at the infection site Severe back/neck pain Any new symptoms which are concerning to you  Please note:  Although the local anesthetic injected can often make your back or neck feel good for several  hours after the injection, the pain will likely return.  It takes 3-7 days for steroids to work in the epidural space.  You may not notice any pain relief for at least that one week.  If effective, we will often do a series of three injections spaced 3-6 weeks apart to maximally decrease your pain.  After the initial series, we generally will wait several months before considering a repeat injection of the same type.  If you have any questions, please call [phone] [facility]

## 2024-05-20 NOTE — Progress Notes (Signed)
 PROVIDER NOTE: Interpretation of information contained herein should be left to medically-trained personnel. Specific patient instructions are provided elsewhere under "Patient Instructions" section of medical record. This document was created in part using STT-dictation technology, any transcriptional errors that may result from this process are unintentional.  Patient: Robert Fernandez Type: Established DOB: [birth year] MRN: [id number] PCP: Damian Duke, MD  Service: Procedure DOS: 05/20/2024 Setting: Ambulatory Location: Ambulatory outpatient facility Delivery: Face-to-face Provider: Cephus Collin, MD Specialty: Interventional Pain Management Specialty designation: 09 Location: Outpatient facility Ref. Prov.: Damian Duke, MD       Interventional Therapy   Primary Reason for Visit: Interventional Pain Management Treatment. CC: Neck Pain    Procedure:          Anesthesia, Analgesia, Anxiolysis:  Type: Diagnostic, Greater, Occipital Nerve Block  #1  Region: Posterolateral Cervical Level: Occipital Ridge   Laterality: Bilateral  Anesthesia: Local (1-2% Lidocaine )  Anxiolysis: None  Sedation: None  Guidance: None           Position: Prone  Bilateral occipital neuralgia  NAS-11 Pain score:   Pre-procedure: 8 /10   Post-procedure: 6 /10     H&P (Pre-op Assessment):  Robert Fernandez is a 68 y.o. (year old), male patient, seen today for interventional treatment. He  has a past surgical history that includes Cardiac catheterization (05/24/16); Coronary artery bypass graft (2017); Lumbar disc surgery; Laparoscopic cholecystectomy; Rotator cuff repair (Left); Tibia fracture surgery; Coronary angioplasty; Knee arthroscopy (Left); LENS EYE SURGERY ; INSERTION AQUEOUS SHUNT ; Achilles tendon repair; BLEPHAROPLASTY UPPER EYELID ; Finger amputation (Left); and Shoulder arthroscopy with subacromial decompression, rotator cuff repair and bicep tendon repair (Right, 12/17/2023). Mr.  Fernandez has a current medication list which includes the following prescription(s): acetaminophen , albuterol , aspirin ec, brimonidine , carvedilol , freestyle libre 14 day reader, freestyle libre 14 day sensor, diazepam , jardiance , repatha  sureclick, furosemide , gabapentin , unifine pentips plus, meclizine , methocarbamol , nitroglycerin , omeprazole , pioglitazone , ozempic  (2 mg/dose), spironolactone , and verapamil . His primarily concern today is the Neck Pain  Initial Vital Signs:  Pulse/HCG Rate: 77ECG Heart Rate: 81 Temp: 97.6 F (36.4 C) Resp: 16 BP: 115/74 SpO2: 100 %  BMI: Estimated body mass index is 27.12 kg/m as calculated from the following:   Height as of this encounter: 6' (1.829 m).   Weight as of this encounter: 200 lb (90.7 kg).  Risk Assessment: Allergies: Reviewed. He is allergic to rosuvastatin, augmentin [amoxicillin-pot clavulanate], codeine, and dilaudid [hydromorphone hcl].  Allergy Precautions: None required Coagulopathies: Reviewed. None identified.  Blood-thinner therapy: None at this time Active Infection(s): Reviewed. None identified. Robert Fernandez is afebrile  Site Confirmation: Robert Fernandez was asked to confirm the procedure and laterality before marking the site Procedure checklist: Completed Consent: Before the procedure and under the influence of no sedative(s), amnesic(s), or anxiolytics, the patient was informed of the treatment options, risks and possible complications. To fulfill our ethical and legal obligations, as recommended by the American Medical Association's Code of Ethics, I have informed the patient of my clinical impression; the nature and purpose of the treatment or procedure; the risks, benefits, and possible complications of the intervention; the alternatives, including doing nothing; the risk(s) and benefit(s) of the alternative treatment(s) or procedure(s); and the risk(s) and benefit(s) of doing nothing. The patient was provided information  about the general risks and possible complications associated with the procedure. These may include, but are not limited to: failure to achieve desired goals, infection, bleeding, organ or nerve damage, allergic reactions, paralysis, and death. In addition,  the patient was informed of those risks and complications associated to the procedure, such as failure to decrease pain; infection; bleeding; organ or nerve damage with subsequent damage to sensory, motor, and/or autonomic systems, resulting in permanent pain, numbness, and/or weakness of one or several areas of the body; allergic reactions; (i.e.: anaphylactic reaction); and/or death. Furthermore, the patient was informed of those risks and complications associated with the medications. These include, but are not limited to: allergic reactions (i.e.: anaphylactic or anaphylactoid reaction(s)); adrenal axis suppression; blood sugar elevation that in diabetics may result in ketoacidosis or comma; water retention that in patients with history of congestive heart failure may result in shortness of breath, pulmonary edema, and decompensation with resultant heart failure; weight gain; swelling or edema; medication-induced neural toxicity; particulate matter embolism and blood vessel occlusion with resultant organ, and/or nervous system infarction; and/or aseptic necrosis of one or more joints. Finally, the patient was informed that Medicine is not an exact science; therefore, there is also the possibility of unforeseen or unpredictable risks and/or possible complications that may result in a catastrophic outcome. The patient indicated having understood very clearly. We have given the patient no guarantees and we have made no promises. Enough time was given to the patient to ask questions, all of which were answered to the patient's satisfaction. Robert Fernandez has indicated that he wanted to continue with the procedure. Attestation: I, the ordering provider, attest  that I have discussed with the patient the benefits, risks, side-effects, alternatives, likelihood of achieving goals, and potential problems during recovery for the procedure that I have provided informed consent. Date  Time: 05/20/2024  1:46 PM  Pre-Procedure Preparation:  Monitoring: As per clinic protocol. Respiration, ETCO2, SpO2, BP, heart rate and rhythm monitor placed and checked for adequate function Safety Precautions: Patient was assessed for positional comfort and pressure points before starting the procedure. Time-out: I initiated and conducted the "Time-out" before starting the procedure, as per protocol. The patient was asked to participate by confirming the accuracy of the "Time Out" information. Verification of the correct person, site, and procedure were performed and confirmed by me, the nursing staff, and the patient. "Time-out" conducted as per Joint Commission's Universal Protocol (UP.01.01.01). Time: 1441 Start Time: 1441 hrs.  Description of Procedure:          Target Area: Area medial to the occipital artery at the level of the superior nuchal ridge Approach: Posterior approach Area Prepped: Entire Posterior Occipital Region ChloraPrep (2% chlorhexidine  gluconate and 70% isopropyl alcohol) Safety Precautions: Aspiration looking for blood return was conducted prior to all injections. At no point did we inject any substances, as a needle was being advanced. No attempts were made at seeking any paresthesias. Safe injection practices and needle disposal techniques used. Medications properly checked for expiration dates. SDV (single dose vial) medications used. Description of the Procedure: Protocol guidelines were followed. The target area was identified and the area prepped in the usual manner. Skin & deeper tissues infiltrated with local anesthetic. Appropriate amount of time allowed to pass for local anesthetics to take effect. The procedure needles were then advanced to the  target area. Proper needle placement secured. Negative aspiration confirmed. Solution injected in intermittent fashion, asking for systemic symptoms every 0.5cc of injectate. The needles were then removed and the area cleansed, making sure to leave some of the prepping solution back to take advantage of its long term bactericidal properties.  Vitals:   05/20/24 1437 05/20/24 1443 05/20/24 1446 05/20/24 1458  BP: 113/84 127/82 116/78 115/67  Pulse:      Resp: 15 18 16 16   Temp:      SpO2: 96% 95% 95% 98%  Weight:      Height:        Start Time: 1441 hrs. End Time: 1445 hrs. Materials:  Needle(s) Type: Regular needle Gauge: 25G Length: 1.5-in Medication(s): Please see orders for medications and dosing details. 8 cc solution made of 7 cc of 0.2% ropivacaine , 1 cc of Decadron  10 mg/cc.  4 cc injected for the left greater occipital nerve, 4 cc injected for the right greater occipital nerve Localized Antibiotic Prophylaxis:   Anti-infectives (From admission, onward)    None      Indication(s): None identified   Post-operative Assessment:  Post-procedure Vital Signs:  Pulse/HCG Rate: 7780 Temp: 97.6 F (36.4 C) Resp: 16 BP: 115/67 SpO2: 98 %  EBL: None  Complications: No immediate post-treatment complications observed by team, or reported by patient.  Note: The patient tolerated the entire procedure well. A repeat set of vitals were taken after the procedure and the patient was kept under observation following institutional policy, for this type of procedure. Post-procedural neurological assessment was performed, showing return to baseline, prior to discharge. The patient was provided with post-procedure discharge instructions, including a section on how to identify potential problems. Should any problems arise concerning this procedure, the patient was given instructions to immediately contact us , at any time, without hesitation. In any case, we plan to contact the patient by  telephone for a follow-up status report regarding this interventional procedure.  Comments:  No additional relevant information.  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 Position: Prone Imaging guidance: Fluoroscopy required. Contrast required unless contraindicated by allergy or severe CKD. Equipment & Materials: Epidural tray & needle.    Scheduling Instructions:     Procedure: Cervical Epidural Steroid Injection/Block     Planned Level(s): C7-T1     Laterality: TBD     Anxiolysis: Patient's choice.     Timeframe: Today    Where will this procedure be performed?:   [facility]             Daveda Larock   GREATER OCCIPITAL NERVE BLOCK    Scheduling Instructions:     Procedure: Occipital nerve block     Laterality: Bilateral     Sedation: Patient's choice.     Timeframe: Today    Where will this procedure be performed?:   [facility]   [facility] C-ARM 1-60 MIN NO REPORT    Intraoperative interpretation by procedural physician at [facility].    Standing Status:   Standing    Number of Occurrences:   1    Reason for exam::   Assistance in needle guidance and placement for procedures requiring needle placement in or near specific anatomical locations not easily accessible without such assistance.    Medications ordered for procedure: Meds ordered this encounter  Medications   iohexol  (OMNIPAQUE ) 180 MG/ML injection 10 mL    Must be Myelogram-compatible. If not available, you may substitute with a water-soluble, non-ionic, hypoallergenic, myelogram-compatible radiological contrast medium.   lidocaine  (XYLOCAINE ) 2 % (with pres) injection 400 mg   ropivacaine  (PF) 2 mg/mL (0.2%) (NAROPIN ) injection 1 mL   sodium chloride  flush (NS) 0.9 % injection 1 mL  dexamethasone  (DECADRON ) injection 10 mg    dexamethasone  (DECADRON ) injection 10 mg   Medications administered: We administered iohexol , lidocaine , ropivacaine  (PF) 2 mg/mL (0.2%), sodium chloride  flush, dexamethasone , and dexamethasone .  See the medical record for exact dosing, route, and time of administration.  Follow-up plan:   Return in about 5 weeks (around 06/24/2024), or F2F PPE.       Recent Visits No visits were found meeting these conditions. Showing recent visits within past 90 days and meeting all other requirements Today's Visits Date Type Provider Dept  05/20/24 Procedure visit Cephus Collin, MD [facility]  Showing today's visits and meeting all other requirements Future Appointments Date Type Provider Dept  06/25/24 Appointment Cephus Collin, MD [facility]  Showing future appointments within next 90 days and meeting all other requirements  Disposition: Discharge home  Discharge (Date  Time): 05/20/2024; 1455 hrs.   Primary Care Physician: Damian Duke, MD Location: [facility] Note by: Cephus Collin, MD (TTS technology used. I apologize for any typographical errors that were not detected and corrected.) Date: 05/20/2024; Time: 3:26 PM  Disclaimer:  Medicine is not an Visual merchandiser. The only guarantee in medicine is that nothing is guaranteed. It is important to note that the decision to proceed with this intervention was based on the information collected from the patient. The Data and conclusions were drawn from the patient's questionnaire, the interview, and the physical examination. Because the information was provided in large part by the patient, it cannot be guaranteed that it has not been purposely or unconsciously manipulated. Every effort has been made to obtain as much relevant data as possible for this evaluation. It is important to note that the conclusions that lead to this procedure are derived in large part from the available data. Always  take into account that the treatment will also be dependent on availability of resources and existing treatment guidelines, considered by other Pain Management Practitioners as being common knowledge and practice, at the time of the intervention. For Medico-Legal purposes, it is also important to point out that variation in procedural techniques and pharmacological choices are the acceptable norm. The indications, contraindications, technique, and results of the above procedure should only be interpreted and judged by a Board-Certified Interventional Pain Specialist with extensive familiarity and expertise in the same exact procedure and technique.

## 2024-05-20 NOTE — Progress Notes (Deleted)
 PROVIDER NOTE: Interpretation of information contained herein should be left to medically-trained personnel. Specific patient instructions are provided elsewhere under "Patient Instructions" section of medical record. This document was created in part using AI and STT-dictation technology, any transcriptional errors that may result from this process are unintentional.  Patient: Robert Fernandez  Service: E/M Encounter  Provider: Cephus Collin, MD  DOB: [birth year]  Delivery: Face-to-face  Specialty: Interventional Pain Management  MRN: [id number]  Setting: Ambulatory outpatient facility  Specialty designation: 09  Type: New Patient  Location: Outpatient office facility  PCP: Damian Duke, MD  DOS: 05/20/2024    Referring Prov.: Damian Duke, MD   Primary Reason(s) for Visit: Encounter for initial evaluation of one or more chronic problems (new to examiner) potentially causing chronic pain, and posing a threat to normal musculoskeletal function. (Level of risk: High) CC: No chief complaint on file.  HPI  Mr. Vanscyoc is a 68 y.o. year old, male patient, who comes for the first time to our practice referred by Damian Duke, MD for our initial evaluation of his chronic pain. 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; Diabetic retinopathy of both eyes (HCC); Dysfunction of Eustachian tube, bilateral; Erectile dysfunction; Facial droop; Hepatosplenomegaly; History of deep venous thrombosis; HLD (hyperlipidemia); HTN (hypertension); Hx of CABG; Left adrenal mass (HCC); Meningioma (HCC); Migraines; Myofascial pain; Neck pain; Neovascular glaucoma, indeterminate stage, left; Nocturnal leg cramps; Obstructive sleep apnea; Osteoarthritis; Osteopenia; Overweight (BMI 25.0-29.9); Pain of left calf; Pain of left lower leg; Plantar fasciitis; Preoperative evaluation of  a medical condition to rule out surgical contraindications (TAR required); Raised intraocular pressure of right eye; Slurred speech; SOBOE (shortness of breath on exertion); Strain of calf muscle; 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 Today he comes in for evaluation of his No chief complaint on file.  Pain Assessment: Location:     Radiating:   Onset:   Duration:   Quality:   Severity:  /10 (subjective, self-reported pain score)  Effect on ADL:   Timing:   Modifying factors:   BP:    HR:    Onset and Duration: {Hx; Onset and Duration:210120511} Cause of pain: {Hx; Cause:210120521} Severity: {Pain Severity:210120502} Timing: {Symptoms; Timing:210120501} Aggravating Factors: {Causes; Aggravating pain factors:210120507} Alleviating Factors: {Causes; Alleviating Factors:210120500} Associated Problems: {Hx; Associated problems:210120515} Quality of Pain: {Hx; Symptom quality or Descriptor:210120531} Previous Examinations or Tests: {Hx; Previous examinations or test:210120529} Previous Treatments: {Hx; Previous Treatment:210120503}  Mr. Folz is being evaluated for possible interventional pain management therapies for the treatment of his chronic pain.  Discussed the use of AI scribe software for clinical note transcription with the patient, who gave verbal consent to proceed.  History of Present Illness    ***  Mr. Goulette has been informed that this initial visit was an evaluation only.  On the follow up appointment I will go over the results, including ordered tests and available interventional therapies. At that time he will have the opportunity to decide whether to proceed with offered therapies or not. In the event that Mr.  Vandenbrink prefers avoiding interventional options, this will conclude our involvement in the case.  Medication management recommendations may be provided upon request.  Patient informed that  diagnostic tests may be ordered to assist in identifying underlying causes, narrow the list of differential diagnoses and aid in determining candidacy for (or contraindications to) planned therapeutic interventions.  Historic Controlled Substance Pharmacotherapy Review  PMP and historical list of controlled substances: ***  Most recently prescribed opioid analgesics: *** MME/day: *** mg/day  Historical Monitoring: The patient  reports that he does not currently use drugs. List of prior UDS Testing: No results found for: "MDMA", "COCAINSCRNUR", "PCPSCRNUR", "PCPQUANT", "CANNABQUANT", "THCU", "ETH", "CBDTHCR", "D8THCCBX", "D9THCCBX" Historical Background Evaluation: [place] PMP: PDMP not reviewed this encounter. Review of the past 99-months conducted.             PMP NARX Score Report:  Narcotic: *** Sedative: *** Stimulant: *** [place] Department of public safety, offender search: Engineer, mining Information) Non-contributory Risk Assessment Profile: Aberrant behavior: None observed or detected today Risk factors for fatal opioid overdose: None identified today PMP NARX Overdose Risk Score: *** Fatal overdose hazard ratio (HR): Calculation deferred Non-fatal overdose hazard ratio (HR): Calculation deferred Risk of opioid abuse or dependence: 0.7-3.0% with doses <= 36 MME/day and 6.1-26% with doses >= 120 MME/day. Substance use disorder (SUD) risk level: See below Personal History of Substance Abuse (SUD-Substance use disorder):  Alcohol:    Illegal Drugs:    Rx Drugs:    ORT Risk Level calculation:    ORT Scoring interpretation table:  Score <3 = Low Risk for SUD  Score between 4-7 = Moderate Risk for SUD  Score >8 = High Risk for Opioid Abuse   PHQ-2 Depression Scale:  Total score:    PHQ-2 Scoring  interpretation table: (Score and probability of major depressive disorder)  Score 0 = No depression  Score 1 = 15.4% Probability  Score 2 = 21.1% Probability  Score 3 = 38.4% Probability  Score 4 = 45.5% Probability  Score 5 = 56.4% Probability  Score 6 = 78.6% Probability   PHQ-9 Depression Scale:  Total score:    PHQ-9 Scoring interpretation table:  Score 0-4 = No depression  Score 5-9 = Mild depression  Score 10-14 = Moderate depression  Score 15-19 = Moderately severe depression  Score 20-27 = Severe depression (2.4 times higher risk of SUD and 2.89 times higher risk of overuse)   Pharmacologic Plan: As per protocol, I have not taken over any controlled substance management, pending the results of ordered tests and/or consults.            Initial impression: Pending review of available data and ordered tests.  Meds   Current Outpatient Medications:    acetaminophen  (TYLENOL ) 500 MG tablet, Take 500 mg by mouth every 6 (six) hours as needed for moderate pain (pain score 4-6)., Disp: , Rfl:    albuterol  (VENTOLIN  HFA) 108 (90 Base) MCG/ACT inhaler, Inhale 2 puffs into the lungs every 4 (four) hours as needed., Disp: 6.7 g, Rfl: 0   aspirin EC 81 MG tablet, Take 81 mg by mouth daily., Disp: , Rfl:    brimonidine  (ALPHAGAN ) 0.2 % ophthalmic solution, Place 1 drop into both eyes 2 (two) times daily., Disp: 30 mL, Rfl: 4   carvedilol  (COREG ) 3.125 MG tablet, Take 1 tablet (3.125 mg total) by mouth 2 (two) times daily with a meal., Disp: 180 tablet, Rfl: 3   Continuous Blood Gluc Receiver (FREESTYLE LIBRE 14 DAY READER) DEVI, Use as directed, Disp: 1 each, Rfl: 0   Continuous Glucose Sensor (FREESTYLE LIBRE 14 DAY SENSOR) MISC, for glucose monitoring, Disp: 7 each, Rfl: 3  diazepam  (VALIUM ) 5 MG tablet, Take 1 tablet (5 mg total) by mouth 30 minutes prior to MRI scan. Then take additional tablet at time of MRI if needed for anxiety., Disp: 2 tablet, Rfl: 0   empagliflozin  (JARDIANCE ) 25  MG TABS tablet, Take 1 tablet (25 mg total) by mouth daily with breakfast., Disp: 90 tablet, Rfl: 3   Evolocumab  (REPATHA  SURECLICK) 140 MG/ML SOAJ, Inject 140 mg under the skin every 14 (fourteen) days., Disp: 2 mL, Rfl: 3   furosemide  (LASIX ) 20 MG tablet, Take 1 tablet (20 mg total) by mouth daily., Disp: 100 tablet, Rfl: 3   gabapentin  (NEURONTIN ) 300 MG capsule, Take 1 capsule (300 mg total) by mouth 2 (two) times daily AND 3 capsules (900 mg total) at bedtime., Disp: 450 capsule, Rfl: 3   Insulin  Pen Needle (UNIFINE PENTIPS PLUS) 31G X 6 MM MISC, use as directed, Disp: 100 each, Rfl: 12   meclizine  (ANTIVERT ) 25 MG tablet, Take 1 tablet (25 mg total) by mouth 3 (three) times daily as needed for dizziness, Disp: 30 tablet, Rfl: 1   methocarbamol  (ROBAXIN ) 750 MG tablet, Take 1 tablet (750 mg total) by mouth 2 (two) times daily as needed., Disp: 60 tablet, Rfl: 11   nitroGLYCERIN  (NITROSTAT ) 0.4 MG SL tablet, Place 1 tablet (0.4 mg total) under the tongue every 5 (five) minutes as needed for chest pain. Max is 3 doses per incident., Disp: 25 tablet, Rfl: 3   omeprazole  (PRILOSEC) 40 MG capsule, Take 1 capsule (40 mg total) by mouth 2 (two) times daily  before meals., Disp: 180 capsule, Rfl: 3   pioglitazone  (ACTOS ) 30 MG tablet, Take 1 tablet (30 mg total) by mouth daily., Disp: 90 tablet, Rfl: 3   Semaglutide , 2 MG/DOSE, (OZEMPIC , 2 MG/DOSE,) 8 MG/3ML SOPN, Inject 2 mg into the skin once a week., Disp: 9 mL, Rfl: 3   spironolactone  (ALDACTONE ) 50 MG tablet, Take 1 tablet (50 mg total) by mouth daily., Disp: 90 tablet, Rfl: 1   verapamil  (VERELAN ) 360 MG 24 hr capsule, Take 1 capsule (360 mg total) by mouth at bedtime., Disp: 90 capsule, Rfl: 3  Imaging Review  Cervical Imaging: Cervical MR wo contrast: Results for orders placed during the hospital encounter of 09/24/23  MR CERVICAL SPINE WO CONTRAST  Narrative CLINICAL DATA:  Neck pain, chronic, degenerative changes on xray  EXAM: MRI  CERVICAL SPINE WITHOUT CONTRAST  TECHNIQUE: Multiplanar, multisequence MR imaging of the cervical spine was performed. No intravenous contrast was administered.  COMPARISON:  MR C Spine 11/09/20  FINDINGS: Alignment: Physiologic.  Vertebrae: No fracture, evidence of discitis, or bone lesion.  Cord: Normal signal and morphology.  Posterior Fossa, vertebral arteries, paraspinal tissues: Negative.  Disc levels:  Limitations: Motion degraded exam  C1-C2: Mild degenerative change.  C2-C3: Mild bilateral facet degenerative change. No spinal canal or neural foraminal narrowing.  C3-C4: Mild bilateral facet degenerative change. Uncovertebral hypertrophy. Mild right neural foraminal narrowing. No spinal canal narrowing.  C4-C5: Mild bilateral facet degenerative change. Uncovertebral hypertrophy. Mild bilateral neural foraminal narrowing. No spinal canal narrowing.  C5-C6: Mild bilateral facet degenerative change. Uncovertebral hypertrophy. Mild bilateral neural foraminal narrowing. No spinal canal narrowing.  C6-C7: Circumferential disc bulge. Mild spinal canal narrowing. Uncovertebral hypertrophy. Mild bilateral neural foraminal narrowing, right-greater-than-left.  C7-T1: Mild bilateral facet degenerative change. No spinal canal or neural foraminal narrowing.  IMPRESSION: The degree of motion artifact limits accurate assessment of the degree of spinal canal or neural foraminal stenosis.  1. Mild  multilevel degenerative changes without evidence of high-grade spinal canal narrowing.  2. There is mild neural foraminal narrowing at C3-C4 (right) and C4-C7 (bilateral).   Electronically Signed By: Clora Dane M.D. On: 10/10/2023 12:55  Cervical MR wo contrast: No results found for this or any previous visit.  Cervical MR w/wo contrast: No results found for this or any previous visit.  Cervical MR w contrast: No results found for this or any previous  visit.  Cervical CT wo contrast: No results found for this or any previous visit.  Cervical CT w/wo contrast: No results found for this or any previous visit.  Cervical CT w/wo contrast: No results found for this or any previous visit.  Cervical CT w contrast: No results found for this or any previous visit.  Cervical CT outside: No results found for this or any previous visit.  Cervical DG 1 view: No results found for this or any previous visit.  Cervical DG 2-3 views: No results found for this or any previous visit.  Cervical DG F/E views: No results found for this or any previous visit.  Cervical DG 2-3 clearing views: No results found for this or any previous visit.  Cervical DG Bending/F/E views: No results found for this or any previous visit.  Cervical DG complete: Results for orders placed during the hospital encounter of 09/24/23  DG Cervical Spine Complete  Narrative CLINICAL DATA:  neck pain  EXAM: CERVICAL SPINE - COMPLETE 4+ VIEW  COMPARISON:  None Available.  FINDINGS: The cervical spine is visualized from C1-the superior endplate of C7. Cervical alignment is maintained. No evidence of dynamic instability on limited excursion flexion and extension views. Vertebral body heights are maintained: no evidence of acute fracture. Relative preservation of the disc spaces with flowing anterior osteophytes. No prevertebral soft tissue swelling. LEFT-sided carotid calcifications.  IMPRESSION: 1. No acute fracture or traumatic listhesis. 2. Flowing anterior osteophytes which can be seen in the setting of diffuse idiopathic skeletal hyperostosis.   Electronically Signed By: Clancy Crimes M.D. On: 10/12/2023 14:14  Cervical DG Myelogram views: No results found for this or any previous visit.  Cervical DG Myelogram views: No results found for this or any previous visit.  Cervical Discogram views: No results found for this or any previous  visit.   Shoulder Imaging: Shoulder-R MR w contrast: No results found for this or any previous visit.  Shoulder-L MR w contrast: No results found for this or any previous visit.  Shoulder-R MR w/wo contrast: No results found for this or any previous visit.  Shoulder-L MR w/wo contrast: No results found for this or any previous visit.  Shoulder-R MR wo contrast: Results for orders placed during the hospital encounter of 05/01/24  MR SHOULDER RIGHT WO CONTRAST  Narrative CLINICAL DATA:  Right shoulder pain since surgery 12/13/2023.  EXAM: MRI OF THE RIGHT SHOULDER WITHOUT CONTRAST  TECHNIQUE: Multiplanar, multisequence MR imaging of the shoulder was performed. No intravenous contrast was administered.  COMPARISON:  MRI right shoulder 11/27/2023  FINDINGS: Rotator cuff: There is metallic susceptible artifact from two screws within the greater tuberosity status post anterior supraspinatus rotator cuff repair. Previously there was an anterior supraspinatus tendon footprint tear extending through the articular surface posteriorly and the bursal and likely articular surface anteriorly. Within the limitations of artifact, this tear is markedly improved. There may be a thin horizontal linear 4 mm partial-thickness articular sided tear at the mid to posterior supraspinatus that is unchanged from prior (coronal series 8,  image 11). The anterior high-grade partial-thickness and full-thickness tear appears resolved. The infraspinatus is intact. Minimal superior subscapularis tendinosis. The teres minor is intact.  Muscles: Mild-to-moderate supraspinatus muscle atrophy is unchanged.  Biceps long head: The proximal long head of the biceps tendon is no longer well visualized. Question interval biceps tenotomy versus tenodesis. The tendon within the bicipital groove appears normal.  Acromioclavicular Joint: There are moderate degenerative changes of the acromioclavicular joint  including joint space narrowing, subchondral marrow edema, and peripheral osteophytosis. Interval acetabula plasty, now with type II acromion and resolution of the prior anterolateral downsloping of the acromion. Trace fluid within the subacromial/subdeltoid bursa, similar to prior.  Glenohumeral Joint: Moderate glenoid cartilage thinning.  Labrum: There is mild peripheral blunting of the superior glenoid labrum.  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 No overlying cortical collapse. This is just posterior medial to the more posterior of the surgical screws.  Other: None.  IMPRESSION: 1. Status post anterior supraspinatus rotator cuff repair. The anterior supraspinatus tendon footprint tear is markedly improved. There may be a thin horizontal linear 4 mm partial-thickness residual articular sided tear at the mid to posterior supraspinatus tendon, versus artifact. This is small and of likely low clinical significance. The prior anterior high-grade partial-thickness and full-thickness tear appears resolved/intact. 2. Mild-to-moderate supraspinatus muscle atrophy is unchanged. 3. Interval acromioplasty, now with type II acromion and resolution of the prior anterolateral downsloping of the acromion. 4. The proximal long head of the biceps tendon is no longer well visualized proximal to the bicipital groove. Question interval biceps tenotomy versus tenodesis. 5. 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 No overlying cortical  collapse.   Electronically Signed By: Bertina Broccoli M.D. On: 05/11/2024 11:49  Shoulder-L MR wo contrast: No results found for this or any previous visit.  Shoulder-R CT w contrast: No results found for this or any previous visit.  Shoulder-L CT w contrast: No results found for this or any previous visit.  Shoulder-R CT w/wo contrast: No results found for this or any previous visit.  Shoulder-L CT w/wo contrast: No results found for this or any previous visit.  Shoulder-R CT wo contrast: No results found for this or any previous visit.  Shoulder-L CT wo contrast: No results found for this or any previous visit.  Shoulder-R DG Arthrogram: No results found for this or any previous visit.  Shoulder-L DG Arthrogram: No results found for this or any previous visit.  Shoulder-R DG 1 view: No results found for this or any previous visit.  Shoulder-L DG 1 view: No results found for this or any previous visit.  Shoulder-R DG: No results found for this or any previous visit.  Shoulder-L DG: No results found for this or any previous visit.   Thoracic Imaging: Thoracic MR wo contrast: No results found for this or any previous visit.  Thoracic MR wo contrast: No results found for this or any previous visit.  Thoracic MR w/wo contrast: No results found for this or any previous visit.  Thoracic MR w contrast: No results found for this or any previous  visit.  Thoracic CT wo contrast: No results found for this or any previous visit.  Thoracic CT w/wo contrast: No results found for this or any previous visit.  Thoracic CT w/wo contrast: No results found for this or any previous visit.  Thoracic CT w contrast: No results found for this or any previous visit.  Thoracic DG 2-3 views: No results found for this or any previous visit.  Thoracic DG 4 views: No results found for this or any previous visit.  Thoracic DG: No results found for this or any previous visit.  Thoracic DG  w/swimmers view: No results found for this or any previous visit.  Thoracic DG Myelogram views: No results found for this or any previous visit.  Thoracic DG Myelogram views: No results found for this or any previous visit.   Lumbosacral Imaging: Lumbar MR wo contrast: No results found for this or any previous visit.  Lumbar MR wo contrast: No results found for this or any previous visit.  Lumbar MR w/wo contrast: No results found for this or any previous visit.  Lumbar MR w/wo contrast: No results found for this or any previous visit.  Lumbar MR w contrast: No results found for this or any previous visit.  Lumbar CT wo contrast: No results found for this or any previous visit.  Lumbar CT w/wo contrast: No results found for this or any previous visit.  Lumbar CT w/wo contrast: No results found for this or any previous visit.  Lumbar CT w contrast: No results found for this or any previous visit.  Lumbar DG 1V: No results found for this or any previous visit.  Lumbar DG 1V (Clearing): No results found for this or any previous visit.  Lumbar DG 2-3V (Clearing): No results found for this or any previous visit.  Lumbar DG 2-3 views: No results found for this or any previous visit.  Lumbar DG (Complete) 4+V: No results found for this or any previous visit.        Lumbar DG F/E views: No results found for this or any previous visit.        Lumbar DG Bending views: No results found for this or any previous visit.        Lumbar DG Myelogram views: No results found for this or any previous visit.  Lumbar DG Myelogram: No results found for this or any previous visit.  Lumbar DG Myelogram: No results found for this or any previous visit.  Lumbar DG Myelogram: No results found for this or any previous visit.  Lumbar DG Myelogram Lumbosacral: No results found for this or any previous visit.  Lumbar DG Diskogram views: No results found for this or any previous visit.  Lumbar DG  Diskogram views: No results found for this or any previous visit.  Lumbar DG Epidurogram OP: No results found for this or any previous visit.  Lumbar DG Epidurogram IP: No results found for this or any previous visit.   Sacroiliac Joint Imaging: Sacroiliac Joint DG: No results found for this or any previous visit.  Sacroiliac Joint MR w/wo contrast: No results found for this or any previous visit.  Sacroiliac Joint MR wo contrast: No results found for this or any previous visit.   Spine Imaging: Whole Spine DG Myelogram views: No results found for this or any previous visit.  Whole Spine MR Mets screen: No results found for this or any previous visit.  Whole Spine MR Mets screen: No results found for this or  any previous visit.  Whole Spine MR w/wo: No results found for this or any previous visit.  MRA Spinal Canal w/ cm: No results found for this or any previous visit.  MRA Spinal Canal wo/ cm: No results found for this or any previous visit.  MRA Spinal Canal w/wo cm: No results found for this or any previous visit.  Spine Outside MR Films: No results found for this or any previous visit.  Spine Outside CT Films: No results found for this or any previous visit.  CT-Guided Biopsy: No results found for this or any previous visit.  CT-Guided Needle Placement: No results found for this or any previous visit.  DG Spine outside: No results found for this or any previous visit.  IR Spine outside: No results found for this or any previous visit.  NM Spine outside: No results found for this or any previous visit.   Hip Imaging: Hip-R MR w contrast: No results found for this or any previous visit.  Hip-L MR w contrast: No results found for this or any previous visit.  Hip-R MR w/wo contrast: No results found for this or any previous visit.  Hip-L MR w/wo contrast: No results found for this or any previous visit.  Hip-R MR wo contrast: No results found for this or any  previous visit.  Hip-L MR wo contrast: No results found for this or any previous visit.  Hip-R CT w contrast: No results found for this or any previous visit.  Hip-L CT w contrast: No results found for this or any previous visit.  Hip-R CT w/wo contrast: No results found for this or any previous visit.  Hip-L CT w/wo contrast: No results found for this or any previous visit.  Hip-R CT wo contrast: No results found for this or any previous visit.  Hip-L CT wo contrast: Results for orders placed during the hospital encounter of 09/24/21  CT Hip Left Wo Contrast  Narrative CLINICAL DATA:  Left hip pain after fall.  EXAM: CT OF THE LEFT HIP WITHOUT CONTRAST  TECHNIQUE: Multidetector CT imaging of the left hip was performed according to the standard protocol. Multiplanar CT image reconstructions were also generated.  COMPARISON:  CT abdomen pelvis dated July 28, 2020.  FINDINGS: Bones/Joint/Cartilage  No fracture or dislocation. Moderate right hip osteoarthritis. No joint effusion.  Ligaments  Ligaments are suboptimally evaluated by CT.  Muscles and Tendons Grossly intact.  No muscle atrophy.  Soft tissue No fluid collection or hematoma.  No soft tissue mass.  IMPRESSION: 1. No acute osseous abnormality. 2. Moderate right hip osteoarthritis.   Electronically Signed By: Aleta Anda M.D. On: 09/24/2021 15:21  Hip-R DG 2-3 views: No results found for this or any previous visit.  Hip-L DG 2-3 views: No results found for this or any previous visit.  Hip-R DG Arthrogram: No results found for this or any previous visit.  Hip-L DG Arthrogram: No results found for this or any previous visit.  Hip-B DG Bilateral: No results found for this or any previous visit.  Hip-B DG Bilateral (5V): No results found for this or any previous visit.   Knee Imaging: Knee-R MR w contrast: No results found for this or any previous visit.  Knee-L MR w/o contrast: No results  found for this or any previous visit.  Knee-R MR w/wo contrast: No results found for this or any previous visit.  Knee-L MR w/wo contrast: No results found for this or any previous visit.  Knee-R MR  wo contrast: No results found for this or any previous visit.  Knee-L MR wo contrast: No results found for this or any previous visit.  Knee-R CT w contrast: No results found for this or any previous visit.  Knee-L CT w contrast: No results found for this or any previous visit.  Knee-R CT w/wo contrast: No results found for this or any previous visit.  Knee-L CT w/wo contrast: No results found for this or any previous visit.  Knee-R CT wo contrast: No results found for this or any previous visit.  Knee-L CT wo contrast: No results found for this or any previous visit.  Knee-R DG 1-2 views: No results found for this or any previous visit.  Knee-L DG 1-2 views: No results found for this or any previous visit.  Knee-R DG 3 views: No results found for this or any previous visit.  Knee-L DG 3 views: No results found for this or any previous visit.  Knee-R DG 4 views: No results found for this or any previous visit.  Knee-L DG 4 views: No results found for this or any previous visit.  Knee-R DG Arthrogram: No results found for this or any previous visit.  Knee-L DG Arthrogram: No results found for this or any previous visit.   Ankle Imaging: Ankle-R DG Complete: No results found for this or any previous visit.  Ankle-L DG Complete: No results found for this or any previous visit.   Foot Imaging: Foot-R DG Complete: No results found for this or any previous visit.  Foot-L DG Complete: Results for orders placed during the hospital encounter of 06/11/23  DG Foot Complete Left  Narrative CLINICAL DATA:  Left foot pain.  EXAM: LEFT FOOT - COMPLETE 3+ VIEW  COMPARISON:  None Available.  FINDINGS: There is no acute fracture or dislocation. The bones are  osteopenic. Degenerative changes and osteophyte at the dorsal aspect of the anterior talus. Anchor pins noted in the posterior calcaneus. The soft tissues are unremarkable. Vascular calcifications noted.  IMPRESSION: 1. No acute fracture or dislocation. 2. Osteopenia and degenerative changes.   Electronically Signed By: Angus Bark M.D. On: 06/11/2023 22:06   Elbow Imaging: Elbow-R DG Complete: No results found for this or any previous visit.  Elbow-L DG Complete: No results found for this or any previous visit.   Wrist Imaging: Wrist-R DG Complete: No results found for this or any previous visit.  Wrist-L DG Complete: No results found for this or any previous visit.   Hand Imaging: Hand-R DG Complete: No results found for this or any previous visit.  Hand-L DG Complete: No results found for this or any previous visit.   Complexity Note: Imaging results reviewed.                         ROS  Cardiovascular: {Hx; Cardiovascular History:210120525} Pulmonary or Respiratory: {Hx; Pumonary and/or Respiratory History:210120523} Neurological: {Hx; Neurological:210120504} Psychological-Psychiatric: {Hx; Psychological-Psychiatric History:210120512} Gastrointestinal: {Hx; Gastrointestinal:210120527} Genitourinary: {Hx; Genitourinary:210120506} Hematological: {Hx; Hematological:210120510} Endocrine: {Hx; Endocrine history:210120509} Rheumatologic: {Hx; Rheumatological:210120530} Musculoskeletal: {Hx; Musculoskeletal:210120528} Work History: {Hx; Work history:210120514}  Allergies  Mr. Sass is allergic to rosuvastatin, augmentin [amoxicillin-pot clavulanate], codeine, and dilaudid [hydromorphone hcl].  Laboratory Chemistry Profile   Renal Lab Results  Component Value Date   BUN 17 02/07/2024   CREATININE 0.78 02/07/2024   GFRNONAA >60 02/07/2024     Electrolytes Lab Results  Component Value Date   NA 134 (L) 02/07/2024   K 3.9 02/07/2024  CL 94 (L)  02/07/2024   CALCIUM 9.4 02/07/2024     Hepatic Lab Results  Component Value Date   AST 24 09/07/2022   ALT 20 09/07/2022   ALBUMIN 3.7 09/07/2022   ALKPHOS 78 09/07/2022     ID No results found for: "LYMEIGGIGMAB", "HIV", "SARSCOV2NAA", "STAPHAUREUS", "MRSAPCR", "HCVAB", "PREGTESTUR", "RMSFIGG", "QFVRPH1IGG", "QFVRPH2IGG"   Bone No results found for: "VD25OH", "VD125OH2TOT", "ZO1096EA5", "WU9811BJ4", "25OHVITD1", "25OHVITD2", "25OHVITD3", "TESTOFREE", "TESTOSTERONE"   Endocrine Lab Results  Component Value Date   GLUCOSE 335 (H) 02/07/2024     Neuropathy No results found for: "VITAMINB12", "FOLATE", "HGBA1C", "HIV"   CNS No results found for: "COLORCSF", "APPEARCSF", "RBCCOUNTCSF", "WBCCSF", "POLYSCSF", "LYMPHSCSF", "EOSCSF", "PROTEINCSF", "GLUCCSF", "JCVIRUS", "CSFOLI", "IGGCSF", "LABACHR", "ACETBL"   Inflammation (CRP: Acute  ESR: Chronic) Lab Results  Component Value Date   ESRSEDRATE 8 09/07/2022     Rheumatology No results found for: "RF", "ANA", "LABURIC", "URICUR", "LYMEIGGIGMAB", "LYMEABIGMQN", "HLAB27"   Coagulation Lab Results  Component Value Date   PLT 325 02/07/2024     Cardiovascular Lab Results  Component Value Date   HGB 13.9 02/07/2024   HCT 40.4 02/07/2024     Screening No results found for: "SARSCOV2NAA", "COVIDSOURCE", "STAPHAUREUS", "MRSAPCR", "HCVAB", "HIV", "PREGTESTUR"   Cancer No results found for: "CEA", "CA125", "LABCA2"   Allergens No results found for: "ALMOND", "APPLE", "ASPARAGUS", "AVOCADO", "BANANA", "BARLEY", "BASIL", "BAYLEAF", "GREENBEAN", "LIMABEAN", "WHITEBEAN", "BEEFIGE", "REDBEET", "BLUEBERRY", "BROCCOLI", "CABBAGE", "MELON", "CARROT", "CASEIN", "CASHEWNUT", "CAULIFLOWER", "CELERY"     Note: Lab results reviewed.  PFSH  Drug: Mr. Lightsey  reports that he does not currently use drugs. Alcohol:  reports no history of alcohol use. Tobacco:  reports that he has quit smoking. His smoking use included cigarettes. He has  never used smokeless tobacco. Medical:  has a past medical history of Arthritis, Asthma, Cirrhosis of liver (HCC), Coronary artery disease, Coronary artery disease involving native heart without angina pectoris, Diabetes mellitus without complication (HCC), Diabetic retinopathy of both eyes (HCC), GERD (gastroesophageal reflux disease), Hepatosplenomegaly, History of deep venous thrombosis, Hyperlipidemia, Hypertension, Left adrenal mass (HCC), Meningioma (HCC), Migraines, Neovascular glaucoma, indeterminate stage, left, Neuropathy, Obstructive sleep apnea, Osteoarthritis, Osteopenia, and PONV (postoperative nausea and vomiting). Family: family history includes Breast cancer in his mother and sister; Heart attack in his father; Heart disease in his maternal grandmother; Hypertension in his father.  Past Surgical History:  Procedure Laterality Date   ACHILLES TENDON REPAIR     BLEPHAROPLASTY UPPER EYELID      CARDIAC CATHETERIZATION  05/24/16   CORONARY ANGIOPLASTY     CORONARY ARTERY BYPASS GRAFT  2017   FINGER AMPUTATION Left    INSERTION AQUEOUS SHUNT      KNEE ARTHROSCOPY Left    LAPAROSCOPIC CHOLECYSTECTOMY     LENS EYE SURGERY      LUMBAR DISC SURGERY     l4-5   ROTATOR CUFF REPAIR Left    SHOULDER ARTHROSCOPY WITH SUBACROMIAL DECOMPRESSION, ROTATOR CUFF REPAIR AND BICEP TENDON REPAIR Right 12/17/2023   Procedure: RIGHT SHOULDER ARTHROSCOPY WITH DEBRIDEMENT, DECOMPRESSION, ROTATOR CUFF REPAIR, BICEPS TENODESIS, MANIPULATION UNDER ANESTHESIA.;  Surgeon: Elner Hahn, MD;  Location: [facility];  Service: Orthopedics;  Laterality: Right;   TIBIA FRACTURE SURGERY     Active Ambulatory Problems    Diagnosis Date Noted   Ankle fracture 11/06/2018   Asthma 08/13/2019   Cervical radiculitis 10/26/2020   Chest pain 11/06/2018   Chronic diarrhea 09/29/2020   Cirrhosis of liver (HCC) 07/20/2022   Coronary artery disease involving native heart  without angina pectoris 11/06/2018   Diabetic  retinopathy of both eyes (HCC) 06/01/2020   Dysfunction of Eustachian tube, bilateral 09/29/2020   Erectile dysfunction 08/13/2019   Facial droop 11/06/2018   Hepatosplenomegaly 06/28/2020   History of deep venous thrombosis 09/06/2019   HLD (hyperlipidemia) 11/06/2018   HTN (hypertension) 11/06/2018   Hx of CABG 11/06/2018   Left adrenal mass (HCC) 06/28/2020   Meningioma (HCC) 11/26/2018   Migraines 09/06/2019   Myofascial pain 08/16/2022   Neck pain 10/26/2020   Neovascular glaucoma, indeterminate stage, 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   Nocturnal leg cramps 09/29/2020   Obstructive sleep apnea 03/03/2022   Osteoarthritis 07/15/2015   Osteopenia 11/06/2018   Overweight (BMI 25.0-29.9) 10/02/2018   Pain of left calf 06/29/2022   Pain of left lower leg 06/29/2022   Plantar fasciitis 09/06/2019   Preoperative evaluation of a medical condition to rule out surgical contraindications (TAR required) 07/19/2023   Raised intraocular pressure of right eye 09/12/2022   Slurred speech 11/06/2018   SOBOE (shortness of breath on exertion) 07/15/2020   Strain of calf muscle 06/29/2022   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, without long-term current use of insulin  (HCC) 09/12/2022   Unintended weight loss 09/29/2020   Localized osteoarthritis of right shoulder 11/14/2023   Chronic right shoulder pain 11/14/2023   Cervical spondylosis 04/29/2024   Resolved Ambulatory Problems    Diagnosis Date Noted   No Resolved Ambulatory Problems   Past Medical History:  Diagnosis Date   Arthritis    Coronary artery disease    Diabetes mellitus without complication (HCC)    GERD (gastroesophageal reflux disease)    Hyperlipidemia    Hypertension    Neuropathy    PONV (postoperative nausea and vomiting)    Constitutional Exam  General appearance: Well nourished, well developed, and well hydrated. In no apparent acute distress There were no vitals  filed for this visit. BMI Assessment: Estimated body mass index is 28.48 kg/m as calculated from the following:   Height as of 04/29/24: 6' (1.829 m).   Weight as of 04/29/24: 210 lb (95.3 kg).  BMI interpretation table: BMI level Category Range association with higher incidence of chronic pain  <18 kg/m2 Underweight   18.5-24.9 kg/m2 Ideal body weight   25-29.9 kg/m2 Overweight Increased incidence by 20%  30-34.9 kg/m2 Obese (Class I) Increased incidence by 68%  35-39.9 kg/m2 Severe obesity (Class II) Increased incidence by 136%  >40 kg/m2 Extreme obesity (Class III) Increased incidence by 254%   Patient's current BMI Ideal Body weight  There is no height or weight on file to calculate BMI. Patient weight not recorded   BMI Readings from Last 4 Encounters:  04/29/24 28.48 kg/m  04/23/24 28.48 kg/m  04/21/24 28.21 kg/m  02/07/24 28.21 kg/m   Wt Readings from Last 4 Encounters:  04/29/24 210 lb (95.3 kg)  04/23/24 210 lb (95.3 kg)  04/21/24 208 lb (94.3 kg)  02/07/24 208 lb (94.3 kg)    Psych/Mental status: Alert, oriented x 3 (person, place, & time)       Eyes: PERLA Respiratory: No evidence of acute respiratory distress  Assessment  Primary Diagnosis & Pertinent Problem List: There were no encounter diagnoses.  Visit Diagnosis (New problems to examiner): No diagnosis found. Plan of Care (Initial workup plan)  Note: Mr. Winsett was reminded that as per protocol, today's visit has been an evaluation only. We have not taken over the patient's controlled substance management.  Problem-specific plan: Assessment and Plan Assessment & Plan    Lab Orders  No laboratory test(s) ordered today   Imaging Orders  No imaging studies ordered today   Referral Orders  No referral(s) requested today   Procedure Orders    No procedure(s) ordered today   Pharmacotherapy (current): Medications ordered:  No orders of the defined types were placed in this  encounter.  Medications administered during this visit: Robert Fernandez had no medications administered during this visit.   Analgesic Pharmacotherapy:  Opioid Analgesics: For patients currently taking or requesting to take opioid analgesics, in accordance with [place]  Medical Board Guidelines, we will assess their risks and indications for the use of these substances. After completing our evaluation, we may offer recommendations, but we no longer take patients for medication management. The prescribing physician will ultimately decide, based on his/her training and level of comfort whether to adopt any of the recommendations, including whether or not to prescribe such medicines.  Membrane stabilizer: To be determined at a later time  Muscle relaxant: To be determined at a later time  NSAID: To be determined at a later time  Other analgesic(s): To be determined at a later time   Interventional management options: Mr. Tierno was informed that there is no guarantee that he would be a candidate for interventional therapies. The decision will be based on the results of diagnostic studies, as well as Mr. Flam's risk profile.  Procedure(s) under consideration:  Pending results of ordered studies     Interventional Therapies  Risk Factors  Considerations  Medical Comorbidities:     Planned  Pending:      Under consideration:   Pending   Completed: (Analgesic benefit)1  None at this time   Therapeutic  Palliative (PRN) options:   None established   Completed by other providers:   None reported  1(Analgesic benefit): Expressed in percentage (%). (Local anesthetic[LA] +/- sedation  L.A.Local Anesthetic  Steroid benefit  Ongoing benefit)   Provider-requested follow-up: No follow-ups on file.  Future Appointments  Date Time Provider Department Center  05/25/2024  9:00 AM [facility]  1 [facility] None  06/22/2024  8:30 AM Lucetta Russel, PA-C [facility] None  08/05/2024  9:15  AM Dunn, Elvia Hammans, PA-C [facility] None   I discussed the assessment and treatment plan with the patient. The patient was provided an opportunity to ask questions and all were answered. The patient agreed with the plan and demonstrated an understanding of the instructions.  Patient advised to call back or seek an in-person evaluation if the symptoms or condition worsens.  Duration of encounter: *** minutes.  Total time on encounter, as per AMA guidelines included both the face-to-face and non-face-to-face time personally spent by the physician and/or other qualified health care professional(s) on the day of the encounter (includes time in activities that require the physician or other qualified health care professional and does not include time in activities normally performed by clinical staff). Physician's time may include the following activities when performed: Preparing to see the patient (e.g., pre-charting review of records, searching for previously ordered imaging, lab work, and nerve conduction tests) Review of prior analgesic pharmacotherapies. Reviewing PMP Interpreting ordered tests (e.g., lab work, imaging, nerve conduction tests) Performing post-procedure evaluations, including interpretation of diagnostic procedures Obtaining and/or reviewing separately obtained history Performing a medically appropriate examination and/or evaluation Counseling and educating the patient/family/caregiver Ordering medications, tests, or procedures Referring and communicating with other health care professionals (when not separately reported)  Documenting clinical information in the electronic or other health record Independently interpreting results (not separately reported) and communicating results to the patient/ family/caregiver Care coordination (not separately reported)  Note by: Cephus Collin, MD (TTS and AI technology used. I apologize for any typographical errors that were not detected and  corrected.) Date: 05/20/2024; Time: 2:00 PM

## 2024-05-21 ENCOUNTER — Other Ambulatory Visit: Payer: Self-pay

## 2024-05-21 ENCOUNTER — Telehealth: Payer: Self-pay

## 2024-05-21 NOTE — Telephone Encounter (Signed)
 Post procedure follow up.  Patient states he had a rough day yesterday, but it has calmed down this morning.

## 2024-05-25 ENCOUNTER — Ambulatory Visit: Attending: Student

## 2024-05-25 DIAGNOSIS — R42 Dizziness and giddiness: Secondary | ICD-10-CM | POA: Diagnosis not present

## 2024-05-27 ENCOUNTER — Ambulatory Visit: Admitting: Student in an Organized Health Care Education/Training Program

## 2024-05-27 ENCOUNTER — Telehealth: Payer: Self-pay

## 2024-05-27 ENCOUNTER — Ambulatory Visit: Payer: Self-pay | Admitting: Student

## 2024-05-27 NOTE — Telephone Encounter (Signed)
 Patient informed that discomfort in the neck may be residual effects of GONB done 1 week ago. He also reports cramping in the legs, asking if Dr. Rhesa Celeste can recommend anything. I advised him to discuss at follow-up appt. That appt is 06-25-24, he requests a sooner appt.  Will you call him to schedule a sooner appt if available.  I suppose it can be with Seema.

## 2024-05-27 NOTE — Telephone Encounter (Signed)
 He wants a nurse to call him back. He has a question about cramping in his legs and around the neck muscles. His heart doctor told him to ask us .

## 2024-05-28 ENCOUNTER — Other Ambulatory Visit: Payer: Self-pay

## 2024-05-28 ENCOUNTER — Ambulatory Visit
Attending: Student in an Organized Health Care Education/Training Program | Admitting: Student in an Organized Health Care Education/Training Program

## 2024-05-28 VITALS — BP 121/64 | HR 75 | Temp 96.2°F | Resp 16 | Ht 72.0 in | Wt 200.0 lb

## 2024-05-28 DIAGNOSIS — G894 Chronic pain syndrome: Secondary | ICD-10-CM | POA: Diagnosis not present

## 2024-05-28 DIAGNOSIS — M5481 Occipital neuralgia: Secondary | ICD-10-CM | POA: Diagnosis not present

## 2024-05-28 DIAGNOSIS — M62838 Other muscle spasm: Secondary | ICD-10-CM | POA: Insufficient documentation

## 2024-05-28 MED ORDER — TIZANIDINE HCL 4 MG PO TABS
4.0000 mg | ORAL_TABLET | Freq: Three times a day (TID) | ORAL | 2 refills | Status: AC | PRN
  Filled 2024-05-28: qty 90, 30d supply, fill #0
  Filled 2024-07-20: qty 90, 30d supply, fill #1
  Filled 2024-09-09: qty 90, 30d supply, fill #2

## 2024-05-28 NOTE — Progress Notes (Signed)
 PROVIDER NOTE: Interpretation of information contained herein should be left to medically-trained personnel. Specific patient instructions are provided elsewhere under "Patient Instructions" section of medical record. This document was created in part using AI and STT-dictation technology, any transcriptional errors that may result from this process are unintentional.  Patient: Robert Fernandez  Service: E/M   PCP: Damian Duke, MD  DOB: 1956/10/10  DOS: 05/28/2024  Provider: Cephus Collin, MD  MRN: [id number]  Delivery: Face-to-face  Specialty: Interventional Pain Management  Type: Established Patient  Setting: Ambulatory outpatient facility  Specialty designation: 09  Referring Prov.: Damian Duke, MD  Location: Outpatient office facility       History of present illness (HPI) [name], a 68 y.o. year old male, is here today because of his Muscle spasms of lower extremity [M62.838]. Robert Fernandez primary complain today is Pain, Leg Pain (bilateral), and Hand Pain (Bilateral )   Pain Assessment: Severity of Chronic pain is reported as a 8 /10. Location: Leg (hands, cramps, states he has issues with the hands) Right, Left/calves bilateral, left tight and left ankle swollen, right cramps   pt had Cabbge in @017  vein taken from left leg. Onset: More than a month ago. Quality: Nagging, Discomfort, Aching, Cramping (" my feet go sideways and cramps bad, muscles tightening up"). Timing: Constant. Modifying factor(s): rest. Vitals:  height is 6' (1.829 m) and weight is 200 lb (90.7 kg). His temperature is 96.2 F (35.7 C) (abnormal). His blood pressure is 121/64 and his pulse is 75. His respiration is 16 and oxygen saturation is 99%.  BMI: Estimated body mass index is 27.12 kg/m as calculated from the following:   Height as of this encounter: 6' (1.829 m).   Weight as of this encounter: 200 lb (90.7 kg).  Last encounter: 11/14/2023. Last procedure: 05/20/2024.  Reason for  encounter: patient-requested evaluation.   Discussed the use of AI scribe software for clinical note transcription with the patient, who gave verbal consent to proceed.  History of Present Illness   Robert Fernandez is a 68 year old male who presents with worsening leg cramps and spasms.  He experiences worsening leg cramps and spasms, describing his leg as 'tight as a drum' with associated swelling. These symptoms have been ongoing but have recently worsened, impacting his ability to get up at times. He experiences cramps approximately every two hours and has tried various remedies including drinking pickle juice and eating mustard, but these have not provided relief.  He has been taking muscle relaxers, specifically Robaxin , for a long time, but they have not been effective. He has not tried Flexeril  or tizanidine . He has a history of low sodium levels identified three months ago, but this has not been rechecked since. He consumes vegetables and drinks fluids like green tea and water, but these measures have not alleviated his symptoms.  He has a history of vascular surgery where a vein was removed from his leg and used for a graft in his chest. He inquires if this could be contributing to the swelling and cramps in his leg. He also mentions having restless feet syndrome, which causes him to throw off covers at night, and he wonders if his adjustable bed might be affecting his symptoms.  His neck pain is currently a little better, although he experienced significant discomfort following a previous procedure, which resulted in headaches and swelling sensations in the back of his head. He is still adjusting to the effects of  the treatment.       ROS  Constitutional: Denies any fever or chills Gastrointestinal: No reported hemesis, hematochezia, vomiting, or acute GI distress Musculoskeletal: Denies any acute onset joint swelling, redness, loss of ROM, or weakness Neurological: Lower extremity  cramps  Medication Review  Evolocumab , FreeStyle Libre 14 Day Reader, FreeStyle Libre 14 Day Sensor, Insulin  Pen Needle, Semaglutide  (2 MG/DOSE), acetaminophen , albuterol , aspirin EC, brimonidine , carvedilol , diazepam , empagliflozin , furosemide , gabapentin , meclizine , nitroGLYCERIN , omeprazole , pioglitazone , spironolactone , tiZANidine , and verapamil   History Review  Allergy: Robert Fernandez is allergic to rosuvastatin, augmentin [amoxicillin-pot clavulanate], codeine, and dilaudid [hydromorphone hcl]. Drug: Robert Fernandez  reports that he does not currently use drugs. Alcohol:  reports no history of alcohol use. Tobacco:  reports that he has quit smoking. His smoking use included cigarettes. He has never used smokeless tobacco. Social: Robert Fernandez  reports that he has quit smoking. His smoking use included cigarettes. He has never used smokeless tobacco. He reports that he does not currently use drugs. He reports that he does not drink alcohol. Medical:  has a past medical history of Arthritis, Asthma, Cirrhosis of liver (HCC), Coronary artery disease, Coronary artery disease involving native heart without angina pectoris, Diabetes mellitus without complication (HCC), Diabetic retinopathy of both eyes (HCC), GERD (gastroesophageal reflux disease), Hepatosplenomegaly, History of deep venous thrombosis, Hyperlipidemia, Hypertension, Left adrenal mass (HCC), Meningioma (HCC), Migraines, Neovascular glaucoma, indeterminate stage, left, Neuropathy, Obstructive sleep apnea, Osteoarthritis, Osteopenia, and PONV (postoperative nausea and vomiting). Surgical: Robert Fernandez  has a past surgical history that includes Cardiac catheterization (05/24/16); Coronary artery bypass graft (2017); Lumbar disc surgery; Laparoscopic cholecystectomy; Rotator cuff repair (Left); Tibia fracture surgery; Coronary angioplasty; Knee arthroscopy (Left); LENS EYE SURGERY ; INSERTION AQUEOUS SHUNT ; Achilles tendon repair; BLEPHAROPLASTY  UPPER EYELID ; Finger amputation (Left); and Shoulder arthroscopy with subacromial decompression, rotator cuff repair and bicep tendon repair (Right, 12/17/2023). Family: family history includes Breast cancer in his mother and sister; Heart attack in his father; Heart disease in his maternal grandmother; Hypertension in his father.  Laboratory Chemistry Profile   Renal Lab Results  Component Value Date   BUN 17 02/07/2024   CREATININE 0.78 02/07/2024   GFRNONAA >60 02/07/2024    Hepatic Lab Results  Component Value Date   AST 24 09/07/2022   ALT 20 09/07/2022   ALBUMIN 3.7 09/07/2022   ALKPHOS 78 09/07/2022    Electrolytes Lab Results  Component Value Date   NA 134 (L) 02/07/2024   K 3.9 02/07/2024   CL 94 (L) 02/07/2024   CALCIUM 9.4 02/07/2024    Bone No results found for: "VD25OH", "VD125OH2TOT", "ZO1096EA5", "WU9811BJ4", "25OHVITD1", "25OHVITD2", "25OHVITD3", "TESTOFREE", "TESTOSTERONE"  Inflammation (CRP: Acute Phase) (ESR: Chronic Phase) Lab Results  Component Value Date   ESRSEDRATE 8 09/07/2022         Note: Above Lab results reviewed.  Recent Imaging Review  VAS US  CAROTID Carotid Arterial Duplex Study  Patient Name:  Robert Fernandez  Date of Exam:   05/25/2024 Medical Rec #: [id number]        Accession #:    [id number] Date of Birth: 1956/09/30        Patient Gender: M Patient Age:   61 years Exam Location:  [facility] Procedure:      VAS US  CAROTID Referring Phys: Morey Ar  --------------------------------------------------------------------------------   Indications:       Patient presents today with complaints of dizziness for the  past several months and headaches x 5 days since having                    multiple injections in his neck for muscle tension. He denies                    any other cerebrovascular symptoms. Risk Factors:      Hypertension, hyperlipidemia, Diabetes, past history of                    smoking,  coronary artery disease. Other Factors:     CABG. Comparison Study:  NA  Performing Technologist: Doren Gammons RVT    Examination Guidelines: A complete evaluation includes B-mode imaging, spectral Doppler, color Doppler, and power Doppler as needed of all accessible portions of each vessel. Bilateral testing is considered an integral part of a complete examination. Limited examinations for reoccurring indications may be performed as noted.    Right Carotid Findings: +----------+--------+--------+--------+------------------+------------------+           PSV cm/sEDV cm/sStenosisPlaque DescriptionComments           +----------+--------+--------+--------+------------------+------------------+ CCA Prox  120     23                                                   +----------+--------+--------+--------+------------------+------------------+ CCA Mid   105     22      <50%    heterogenous                         +----------+--------+--------+--------+------------------+------------------+ CCA Distal89      14                                intimal thickening +----------+--------+--------+--------+------------------+------------------+ ICA Prox  74      13      1-39%   heterogenous                         +----------+--------+--------+--------+------------------+------------------+ ICA Distal103     26                                steep dive         +----------+--------+--------+--------+------------------+------------------+ ECA       140     16              heterogenous                         +----------+--------+--------+--------+------------------+------------------+  +----------+--------+-------+----------------+-------------------+           PSV cm/sEDV cmsDescribe        Arm Pressure (mmHG) +----------+--------+-------+----------------+-------------------+ Subclavian204     0      Multiphasic, WNL                     +----------+--------+-------+----------------+-------------------+  +---------+--------+--+--------+-+---------+ VertebralPSV cm/s39EDV cm/s7Antegrade +---------+--------+--+--------+-+---------+     Left Carotid Findings: +----------+--------+--------+--------+------------------+--------+           PSV cm/sEDV cm/sStenosisPlaque DescriptionComments +----------+--------+--------+--------+------------------+--------+ CCA Prox  106     17                                         +----------+--------+--------+--------+------------------+--------+  CCA Mid   107     19      <50%    heterogenous               +----------+--------+--------+--------+------------------+--------+ CCA Distal60      12      <50%    heterogenous               +----------+--------+--------+--------+------------------+--------+ ICA Prox  69      16      1-39%   heterogenous               +----------+--------+--------+--------+------------------+--------+ ICA Distal68      17                                         +----------+--------+--------+--------+------------------+--------+ ECA       67      6                                          +----------+--------+--------+--------+------------------+--------+  +----------+--------+--------+----------------+-------------------+           PSV cm/sEDV cm/sDescribe        Arm Pressure (mmHG) +----------+--------+--------+----------------+-------------------+ Subclavian127     0       Multiphasic, WNL                    +----------+--------+--------+----------------+-------------------+  +---------+--------+--+--------+--+---------+ VertebralPSV cm/s41EDV cm/s11Antegrade +---------+--------+--+--------+--+---------+        Summary: Right Carotid: Velocities in the right ICA are consistent with a 1-39% stenosis.                Non-hemodynamically significant plaque <50% noted in the  CCA.  Left Carotid: Velocities in the left ICA are consistent with a 1-39% stenosis.               Non-hemodynamically significant plaque <50% noted in the CCA.  Vertebrals:  Bilateral vertebral arteries demonstrate antegrade flow. Subclavians: Normal flow hemodynamics were seen in bilateral subclavian              arteries.  *See table(s) above for measurements and observations.    Electronically signed by Antionette Kirks MD on 05/27/2024 at 8:07:38 AM.      Final   Note: Reviewed         Physical Exam  General appearance: Well nourished, well developed, and well hydrated. In no apparent acute distress Mental status: Alert, oriented x 3 (person, place, & time)       Respiratory: No evidence of acute respiratory distress Eyes: PERLA Vitals: BP 121/64   Pulse 75   Temp (!) 96.2 F (35.7 C)   Resp 16   Ht 6' (1.829 m)   Wt 200 lb (90.7 kg)   SpO2 99%   BMI 27.12 kg/m  BMI: Estimated body mass index is 27.12 kg/m as calculated from the following:   Height as of this encounter: 6' (1.829 m).   Weight as of this encounter: 200 lb (90.7 kg). Ideal: Ideal body weight: 77.6 kg (171 lb 1.2 oz) Adjusted ideal body weight: 82.8 kg (182 lb 10.3 oz)  Assessment   Diagnosis Status  1. Muscle spasms of lower extremity   2. Bilateral occipital neuralgia   3. Chronic pain syndrome    Having a Flare-up Controlled Controlled   Updated  Problems: No problems updated.  Plan of Care  Problem-specific:  Assessment and Plan    Leg cramps and spasms   Chronic leg cramps and spasms are severe enough to occasionally prevent ambulation. Robaxin  was previously ineffective. Possible causes include hyponatremia and venous insufficiency from a saphenous vein graft. Prescribe tizanidine  at night and every eight hours as needed, with caution for sedation. Discontinue Robaxin . Recheck blood work to assess sodium levels.  Hyponatremia   Hyponatremia may contribute to muscle cramps. Sodium  levels were low three months ago, and current levels are unknown. Recheck blood work to assess sodium levels. Refer to primary care provider if levels remain low.  Saphenous vein graft   The saphenous vein graft may cause leg swelling and cramps due to altered venous return. This vascular issue is not within the current treatment scope. Consider vascular consultation if symptoms persist or worsen.  Neck pain   Neck pain has improved but was previously linked to severe headaches and swelling after injections. He wishes to avoid similar injections in the future. Maintain follow-up appointment next month to discuss neck pain management.       Robert Fernandez has a current medication list which includes the following long-term medication(s): albuterol , carvedilol , furosemide , gabapentin , nitroglycerin , omeprazole , pioglitazone , spironolactone , and verapamil .  Pharmacotherapy (Medications Ordered): Meds ordered this encounter  Medications   tiZANidine  (ZANAFLEX ) 4 MG tablet    Sig: Take 1 tablet (4 mg total) by mouth every 8 (eight) hours as needed for muscle spasms.    Dispense:  90 tablet    Refill:  2    Do not place this medication, or any other prescription from our practice, on "Automatic Refill". Patient may have prescription filled one day early if pharmacy is closed on scheduled refill date.   Orders:  Orders Placed This Encounter  Procedures   Comp. Metabolic Panel (12)    With GFR. Indications: Chronic Pain Syndrome (G89.4) & Pharmacotherapy (W11.914) Cc PCP: Damian Duke, MD    Has the patient fasted?:   No    CC Results:   PCP-NURSE [[id number]]    Release to patient:   Immediate    Return for Keep sch. appt.    Recent Visits Date Type Provider Dept  05/20/24 Procedure visit Cephus Collin, MD [facility]  Showing recent visits within past 90 days and meeting all other requirements Today's Visits Date Type Provider Dept  05/28/24 Office Visit Cephus Collin, MD [facility]  Showing today's visits and meeting all other requirements Future Appointments Date Type Provider Dept  06/25/24 Appointment Cephus Collin, MD [facility]  Showing future appointments within next 90 days and meeting all other requirements  I discussed the assessment and treatment plan with the patient. The patient was provided an opportunity to ask questions and all were answered. The patient agreed with the plan and demonstrated an understanding of the instructions.  Patient advised to call back or seek an in-person evaluation if the symptoms or condition worsens.  Duration of encounter: .  Total time on encounter, as per AMA guidelines included both the face-to-face and non-face-to-face time personally spent by the physician and/or other qualified health care professional(s) on the day of the encounter (includes time in activities that require the physician or other qualified health care professional and does not include time in activities normally performed by clinical staff). Physician's time may include the following activities when performed: Preparing to see the patient (e.g., pre-charting review of  records, searching for previously ordered imaging, lab work, and nerve conduction tests) Review of prior analgesic pharmacotherapies. Reviewing PMP Interpreting ordered tests (e.g., lab work, imaging, nerve conduction tests) Performing post-procedure evaluations, including interpretation of diagnostic procedures Obtaining and/or reviewing separately obtained history Performing a medically appropriate examination and/or evaluation Counseling and educating the patient/family/caregiver Ordering medications, tests, or procedures Referring and communicating with other health care professionals (when not separately reported) Documenting clinical information in the electronic or other health record Independently interpreting results (not  separately reported) and communicating results to the patient/ family/caregiver Care coordination (not separately reported)  Note by: Cephus Collin, MD (TTS and AI technology used. I apologize for any typographical errors that were not detected and corrected.) Date: 05/28/2024; Time: 2:49 PM

## 2024-05-28 NOTE — Progress Notes (Signed)
 Safety precautions to be maintained throughout the outpatient stay will include: orient to surroundings, keep bed in low position, maintain call bell within reach at all times, provide assistance with transfer out of bed and ambulation.

## 2024-05-29 LAB — COMP. METABOLIC PANEL (12)
AST: 31 IU/L (ref 0–40)
Albumin: 4.4 g/dL (ref 3.9–4.9)
Alkaline Phosphatase: 102 IU/L (ref 44–121)
BUN/Creatinine Ratio: 21 (ref 10–24)
BUN: 19 mg/dL (ref 8–27)
Bilirubin Total: 0.5 mg/dL (ref 0.0–1.2)
Calcium: 9.1 mg/dL (ref 8.6–10.2)
Chloride: 95 mmol/L — ABNORMAL LOW (ref 96–106)
Creatinine, Ser: 0.91 mg/dL (ref 0.76–1.27)
Globulin, Total: 2.6 g/dL (ref 1.5–4.5)
Glucose: 259 mg/dL — ABNORMAL HIGH (ref 70–99)
Potassium: 4.6 mmol/L (ref 3.5–5.2)
Sodium: 135 mmol/L (ref 134–144)
Total Protein: 7 g/dL (ref 6.0–8.5)
eGFR: 92 mL/min/{1.73_m2} (ref >59)

## 2024-06-03 DIAGNOSIS — M24111 Other articular cartilage disorders, right shoulder: Secondary | ICD-10-CM | POA: Diagnosis not present

## 2024-06-03 DIAGNOSIS — M7581 Other shoulder lesions, right shoulder: Secondary | ICD-10-CM | POA: Diagnosis not present

## 2024-06-03 DIAGNOSIS — M879 Osteonecrosis, unspecified: Secondary | ICD-10-CM | POA: Diagnosis not present

## 2024-06-03 DIAGNOSIS — M7501 Adhesive capsulitis of right shoulder: Secondary | ICD-10-CM | POA: Diagnosis not present

## 2024-06-03 DIAGNOSIS — M75121 Complete rotator cuff tear or rupture of right shoulder, not specified as traumatic: Secondary | ICD-10-CM | POA: Diagnosis not present

## 2024-06-03 DIAGNOSIS — M7521 Bicipital tendinitis, right shoulder: Secondary | ICD-10-CM | POA: Diagnosis not present

## 2024-06-05 DIAGNOSIS — M25511 Pain in right shoulder: Secondary | ICD-10-CM | POA: Diagnosis not present

## 2024-06-05 DIAGNOSIS — G8929 Other chronic pain: Secondary | ICD-10-CM | POA: Diagnosis not present

## 2024-06-05 DIAGNOSIS — M1990 Unspecified osteoarthritis, unspecified site: Secondary | ICD-10-CM | POA: Diagnosis not present

## 2024-06-05 DIAGNOSIS — E1141 Type 2 diabetes mellitus with diabetic mononeuropathy: Secondary | ICD-10-CM | POA: Diagnosis not present

## 2024-06-05 DIAGNOSIS — Z794 Long term (current) use of insulin: Secondary | ICD-10-CM | POA: Diagnosis not present

## 2024-06-05 DIAGNOSIS — I2510 Atherosclerotic heart disease of native coronary artery without angina pectoris: Secondary | ICD-10-CM | POA: Diagnosis not present

## 2024-06-05 DIAGNOSIS — M4810 Ankylosing hyperostosis [Forestier], site unspecified: Secondary | ICD-10-CM | POA: Diagnosis not present

## 2024-06-08 ENCOUNTER — Other Ambulatory Visit: Payer: Self-pay

## 2024-06-08 MED ORDER — TRAMADOL HCL 50 MG PO TABS
50.0000 mg | ORAL_TABLET | Freq: Four times a day (QID) | ORAL | 0 refills | Status: DC | PRN
  Filled 2024-06-08: qty 30, 8d supply, fill #0

## 2024-06-12 NOTE — Progress Notes (Deleted)
 Referring Physician:  Damian Duke, MD [address] [place],  [place] [address]  Primary Physician:  Damian Duke, MD  History of Present Illness: [name] has a history of asthma, CAD, DM, GERD, hyperlipidemia, HTN, osteoporosis, RA. History of CABG.   He has known cervical spondylosis with anterior osteophytes along with multilevel foraminal stenosis. He has disc at C6-C7 with mild central stenosis and mild bilateral foraminal stenosis.   He is s/p right shoulder scope with rotator cuff repair by Dr. Daun Epstein on 12/17/23. He is being scheduled for reverse TSA on right.   Last seen by Dr. Felipe Horton on 04/29/24- cervical surgery not recommended. He was to see rheumatology and Dr. Rhesa Celeste. He had greater occipital nerve block on 05/20/24.   He is here for follow up.         He has constant neck pain with stiffness that is worse on right side. He has right shoulder pain from his surgery. No radicular arm pain. He has pain at the base of his skull and between his shoulder blades. He notes some numbness in right arm. No weakness. He's had 2 falls in last month and a half- legs gave way once and he tripped the second time. No dexterity issues.   He continues with right shoulder pain and stiffness after above surgery. Tells me he may need reverse TSA.  He is taking neurontin  and robaxin .    Conservative measures:  Physical therapy: he finished PT for his shoulder, doing stretches at home. Has not done PT for cervical spine Multimodal medical therapy including regular antiinflammatories: ultram , voltaren , skelaxin   Injections:  greater occipital nerve block on 05/20/24 right C7-T1 IL ESI with Dr. Rhesa Celeste on 12/11/23  Past Surgery:  History of lumbar surgery years ago  Review of Systems:  A 10 point review of systems is negative, except for the pertinent positives and negatives detailed in the HPI.  Past Medical History: Past Medical History:   Diagnosis Date   Arthritis    Asthma    Cirrhosis of liver (HCC)    Coronary artery disease    Coronary artery disease involving native heart without angina pectoris    Diabetes mellitus without complication (HCC)    Diabetic retinopathy of both eyes (HCC)    GERD (gastroesophageal reflux disease)    Hepatosplenomegaly    History of deep venous thrombosis    Hyperlipidemia    Hypertension    Left adrenal mass (HCC)    Meningioma (HCC)    Migraines    Neovascular glaucoma, indeterminate stage, left    Neuropathy    Obstructive sleep apnea    Osteoarthritis    Osteopenia    PONV (postoperative nausea and vomiting)     Past Surgical History: Past Surgical History:  Procedure Laterality Date   ACHILLES TENDON REPAIR     BLEPHAROPLASTY UPPER EYELID      CARDIAC CATHETERIZATION  05/24/16   CORONARY ANGIOPLASTY     CORONARY ARTERY BYPASS GRAFT  2017   FINGER AMPUTATION Left    INSERTION AQUEOUS SHUNT      KNEE ARTHROSCOPY Left    LAPAROSCOPIC CHOLECYSTECTOMY     LENS EYE SURGERY      LUMBAR DISC SURGERY     l4-5   ROTATOR CUFF REPAIR Left    SHOULDER ARTHROSCOPY WITH SUBACROMIAL DECOMPRESSION, ROTATOR CUFF REPAIR AND BICEP TENDON REPAIR Right 12/17/2023   Procedure: RIGHT SHOULDER ARTHROSCOPY WITH DEBRIDEMENT, DECOMPRESSION, ROTATOR CUFF REPAIR, BICEPS TENODESIS,  MANIPULATION UNDER ANESTHESIA.;  Surgeon: Elner Hahn, MD;  Location: [facility];  Service: Orthopedics;  Laterality: Right;   TIBIA FRACTURE SURGERY      Allergies: Allergies as of 06/22/2024 - Review Complete 05/28/2024  Allergen Reaction Noted   Rosuvastatin Other (See Comments) 10/16/2019   Augmentin [amoxicillin-pot clavulanate]  11/06/2021   Codeine  11/06/2021   Dilaudid [hydromorphone hcl]  11/06/2021    Medications: Outpatient Encounter Medications as of 06/22/2024  Medication Sig   acetaminophen  (TYLENOL ) 500 MG tablet Take 500 mg by mouth every 6 (six) hours as needed for moderate pain (pain score  4-6).   albuterol  (VENTOLIN  HFA) 108 (90 Base) MCG/ACT inhaler Inhale 2 puffs into the lungs every 4 (four) hours as needed.   aspirin EC 81 MG tablet Take 81 mg by mouth daily.   brimonidine  (ALPHAGAN ) 0.2 % ophthalmic solution Place 1 drop into both eyes 2 (two) times daily.   carvedilol  (COREG ) 3.125 MG tablet Take 1 tablet (3.125 mg total) by mouth 2 (two) times daily with a meal.   Continuous Blood Gluc Receiver (FREESTYLE LIBRE 14 DAY READER) DEVI Use as directed   Continuous Glucose Sensor (FREESTYLE LIBRE 14 DAY SENSOR) MISC for glucose monitoring   diazepam  (VALIUM ) 5 MG tablet Take 1 tablet (5 mg total) by mouth 30 minutes prior to MRI scan. Then take additional tablet at time of MRI if needed for anxiety.   empagliflozin  (JARDIANCE ) 25 MG TABS tablet Take 1 tablet (25 mg total) by mouth daily with breakfast.   Evolocumab  (REPATHA  SURECLICK) 140 MG/ML SOAJ Inject 140 mg under the skin every 14 (fourteen) days.   furosemide  (LASIX ) 20 MG tablet Take 1 tablet (20 mg total) by mouth daily.   gabapentin  (NEURONTIN ) 300 MG capsule Take 1 capsule (300 mg total) by mouth 2 (two) times daily AND 3 capsules (900 mg total) at bedtime.   Insulin  Pen Needle (UNIFINE PENTIPS PLUS) 31G X 6 MM MISC use as directed   meclizine  (ANTIVERT ) 25 MG tablet Take 1 tablet (25 mg total) by mouth 3 (three) times daily as needed for dizziness   nitroGLYCERIN  (NITROSTAT ) 0.4 MG SL tablet Place 1 tablet (0.4 mg total) under the tongue every 5 (five) minutes as needed for chest pain. Max is 3 doses per incident.   omeprazole  (PRILOSEC) 40 MG capsule Take 1 capsule (40 mg total) by mouth 2 (two) times daily  before meals.   pioglitazone  (ACTOS ) 30 MG tablet Take 1 tablet (30 mg total) by mouth daily.   Semaglutide , 2 MG/DOSE, (OZEMPIC , 2 MG/DOSE,) 8 MG/3ML SOPN Inject 2 mg into the skin once a week.   spironolactone  (ALDACTONE ) 50 MG tablet Take 1 tablet (50 mg total) by mouth daily.   tiZANidine  (ZANAFLEX ) 4 MG  tablet Take 1 tablet (4 mg total) by mouth every 8 (eight) hours as needed for muscle spasms.   traMADol  (ULTRAM ) 50 MG tablet Take 1 tablet (50 mg total) by mouth every 6 (six) hours as needed for Pain   verapamil  (VERELAN ) 360 MG 24 hr capsule Take 1 capsule (360 mg total) by mouth at bedtime.   No facility-administered encounter medications on file as of 06/22/2024.    Social History: Social History   Tobacco Use   Smoking status: Former    Types: Cigarettes   Smokeless tobacco: Never  Vaping Use   Vaping status: Never Used  Substance Use Topics   Alcohol use: Never   Drug use: Not Currently    Family  Medical History: Family History  Problem Relation Age of Onset   Breast cancer Mother    Heart attack Father    Hypertension Father    Breast cancer Sister    Heart disease Maternal Grandmother     Physical Examination: There were no vitals filed for this visit.     Awake, alert, oriented to person, place, and time.  Speech is clear and fluent. Fund of knowledge is appropriate.   Cranial Nerves: Pupils equal round and reactive to light.  Facial tone is symmetric.    He has tenderness over occipital region on right with right trapezial tenderness.   No abnormal lesions on exposed skin.   Strength: Side Biceps Triceps Deltoid Interossei Grip Wrist Ext. Wrist Flex.  R 5 5 5 5 5  --- ---  L 5 5 5 5 5 5 5    Side Iliopsoas Quads Hamstring PF DF EHL  R 5 5 5 5 5 5   L 5 5 5 5 5 5    Pain with deltoid testing on right, no gross weakness.   He is missing half of his ring finger on his left hand.   Sensation intact to light touch in bilateral upper extremities.   Reflexes are 2+ and symmetric at the biceps, brachioradialis, patella and achilles.   Hoffman's is absent.  Clonus is not present.    Gait is slow.   Medical Decision Making  Imaging: None   Assessment and Plan: Mr. Callender had right shoulder scope with rotator cuff repair by Dr. Daun Epstein on 12/17/23. He  continues with pain and stiffness in the right shoulder.   He has constant neck pain with stiffness that is worse on right side. No radicular arm pain. He has pain at the base of his skull and between his shoulder blades. No dexterity issues.   He has known cervical spondylosis with anterior osteophytes along with multilevel foraminal stenosis. He has disc at C6-C7 with mild central stenosis and mild bilateral foraminal stenosis.   Neck pain likely from cervical spondylosis. Right shoulder is likely contributing to his pain as well. May have component of right occipital neuritis as well.   Treatment options discussed with patient and following plan made:   - Recommend PT for cervical spine. He declines.  - Follow up with Dr. Rhesa Celeste to consider repeat cervical ESI and/or right occipital nerve block. Message to [place].  - He is interested in surgery options. Will review with Dr. Felipe Horton and message him. Not sure he is a surgery candidate.  - Discussed that right shoulder is likely contributing to his pain as well.  - Follow up in 6-8 weeks and prn.   I spent a total of 30 minutes in face-to-face and non-face-to-face activities related to this patient's care today including review of outside records, review of imaging, review of symptoms, physical exam, discussion of differential diagnosis, discussion of treatment options, and documentation.   Lucetta Russel PA-C Dept. of Neurosurgery

## 2024-06-19 ENCOUNTER — Other Ambulatory Visit: Payer: Self-pay

## 2024-06-19 MED ORDER — VERAPAMIL HCL ER 360 MG PO CP24
360.0000 mg | ORAL_CAPSULE | Freq: Every evening | ORAL | 0 refills | Status: DC
  Filled 2024-06-19: qty 90, 90d supply, fill #0

## 2024-06-22 ENCOUNTER — Ambulatory Visit: Admitting: Orthopedic Surgery

## 2024-06-24 ENCOUNTER — Other Ambulatory Visit: Payer: Self-pay

## 2024-06-25 ENCOUNTER — Other Ambulatory Visit: Payer: Self-pay

## 2024-06-25 ENCOUNTER — Encounter: Payer: Self-pay | Admitting: Student in an Organized Health Care Education/Training Program

## 2024-06-25 ENCOUNTER — Other Ambulatory Visit (HOSPITAL_COMMUNITY): Payer: Self-pay

## 2024-06-25 ENCOUNTER — Ambulatory Visit
Attending: Student in an Organized Health Care Education/Training Program | Admitting: Student in an Organized Health Care Education/Training Program

## 2024-06-25 VITALS — BP 123/72 | HR 90 | Temp 97.5°F | Resp 16 | Ht 72.0 in | Wt 200.0 lb

## 2024-06-25 DIAGNOSIS — M5412 Radiculopathy, cervical region: Secondary | ICD-10-CM | POA: Diagnosis not present

## 2024-06-25 DIAGNOSIS — G894 Chronic pain syndrome: Secondary | ICD-10-CM | POA: Diagnosis not present

## 2024-06-25 DIAGNOSIS — M5481 Occipital neuralgia: Secondary | ICD-10-CM | POA: Insufficient documentation

## 2024-06-25 NOTE — Progress Notes (Signed)
 PROVIDER NOTE: Interpretation of information contained herein should be left to medically-trained personnel. Specific patient instructions are provided elsewhere under Patient Instructions section of medical record. This document was created in part using AI and STT-dictation technology, any transcriptional errors that may result from this process are unintentional.  Patient: Robert Fernandez  Service: E/M   PCP: Miriam Rocky Shams, MD  DOB: 02-29-1956  DOS: 06/25/2024  Provider: Wallie Sherry, MD  MRN: [id number]  Delivery: Face-to-face  Specialty: Interventional Pain Management  Type: Established Patient  Setting: Ambulatory outpatient facility  Specialty designation: 09  Referring Prov.: Miriam Rocky Shams, MD  Location: Outpatient office facility       History of present illness (HPI) Mr. DANNELL GORTNEY, a 68 y.o. year old male, is here today because of his Bilateral occipital neuralgia [M54.81]. Mr. Arwood primary complain today is Neck Pain  Pertinent problems: Mr. Mcewen does not have any pertinent problems on file.  Pain Assessment: Severity of Chronic pain is reported as a 7 /10. Location: Neck Posterior/denies. Onset: More than a month ago. Quality: Pressure. Timing: Constant. Modifying factor(s): procedure. Vitals:  height is 6' (1.829 m) and weight is 200 lb (90.7 kg). His temperature is 97.5 F (36.4 C) (abnormal). His blood pressure is 123/72 and his pulse is 90. His respiration is 16 and oxygen saturation is 97%.  BMI: Estimated body mass index is 27.12 kg/m as calculated from the following:   Height as of this encounter: 6' (1.829 m).   Weight as of this encounter: 200 lb (90.7 kg).  Last encounter: 05/28/2024. Last procedure: 05/20/2024.  Reason for encounter:     History of Present Illness   Robert Fernandez is a 68 year old male who presents for pain management consultation due to shoulder and neck pain.  He has ongoing severe shoulder pain causing significant  functional limitations. He is awaiting shoulder surgery to address a bleeding bone, with plans for a rod and new ball joint placement. He is on a waiting list and could be called for surgery at any time, potentially by the end of the month.  He has a history of scar tissue in the arm, which will be addressed during the upcoming surgery. The scar tissue will be excised to help alleviate his symptoms.  He has received occipital nerve blocks in the past, which provided relief for about three weeks. However, he experienced significant headaches and swelling after the procedure, which he attributes to the injection. The pain was major trouble for three to four days, and he had difficulty contacting medical support during this time.  He also reports neck pain that radiates into the shoulder and shoulder blade, exacerbated by movement. He describes a popping sensation in the neck and is scheduled to see an arthritis specialist on August 6th to address his arthritis-related symptoms.       No results found for: D9THCCBX  ROS  Constitutional: Denies any fever or chills Gastrointestinal: No reported hemesis, hematochezia, vomiting, or acute GI distress Musculoskeletal: Right shoulder pain Neurological: No reported episodes of acute onset apraxia, aphasia, dysarthria, agnosia, amnesia, paralysis, loss of coordination, or loss of consciousness  Medication Review  Evolocumab , FreeStyle Libre 14 Day Reader, FreeStyle Libre 14 Day Sensor, Insulin  Pen Needle, Semaglutide  (2 MG/DOSE), acetaminophen , albuterol , aspirin EC, brimonidine , carvedilol , diazepam , empagliflozin , furosemide , gabapentin , meclizine , nitroGLYCERIN , omeprazole , pioglitazone , spironolactone , tiZANidine , traMADol , and verapamil   History Review  Allergy: Mr. Gadson is allergic to rosuvastatin, augmentin [amoxicillin-pot clavulanate], codeine, and dilaudid [hydromorphone hcl].  Drug: Mr. Fussell  reports that he does not currently use  drugs. Alcohol:  reports no history of alcohol use. Tobacco:  reports that he has quit smoking. His smoking use included cigarettes. He has never used smokeless tobacco. Social: Mr. Heckard  reports that he has quit smoking. His smoking use included cigarettes. He has never used smokeless tobacco. He reports that he does not currently use drugs. He reports that he does not drink alcohol. Medical:  has a past medical history of Arthritis, Asthma, Cirrhosis of liver (HCC), Coronary artery disease, Coronary artery disease involving native heart without angina pectoris, Diabetes mellitus without complication (HCC), Diabetic retinopathy of both eyes (HCC), GERD (gastroesophageal reflux disease), Hepatosplenomegaly, History of deep venous thrombosis, Hyperlipidemia, Hypertension, Left adrenal mass (HCC), Meningioma (HCC), Migraines, Neovascular glaucoma, indeterminate stage, left, Neuropathy, Obstructive sleep apnea, Osteoarthritis, Osteopenia, and PONV (postoperative nausea and vomiting). Surgical: Mr. Gamblin  has a past surgical history that includes Cardiac catheterization (05/24/16); Coronary artery bypass graft (2017); Lumbar disc surgery; Laparoscopic cholecystectomy; Rotator cuff repair (Left); Tibia fracture surgery; Coronary angioplasty; Knee arthroscopy (Left); LENS EYE SURGERY ; INSERTION AQUEOUS SHUNT ; Achilles tendon repair; BLEPHAROPLASTY UPPER EYELID ; Finger amputation (Left); and Shoulder arthroscopy with subacromial decompression, rotator cuff repair and bicep tendon repair (Right, 12/17/2023). Family: family history includes Breast cancer in his mother and sister; Heart attack in his father; Heart disease in his maternal grandmother; Hypertension in his father.  Laboratory Chemistry Profile   Renal Lab Results  Component Value Date   BUN 19 05/28/2024   CREATININE 0.91 05/28/2024   BCR 21 05/28/2024   GFRNONAA >60 02/07/2024    Hepatic Lab Results  Component Value Date   AST 31  05/28/2024   ALT 20 09/07/2022   ALBUMIN 4.4 05/28/2024   ALKPHOS 102 05/28/2024    Electrolytes Lab Results  Component Value Date   NA 135 05/28/2024   K 4.6 05/28/2024   CL 95 (L) 05/28/2024   CALCIUM 9.1 05/28/2024    Bone No results found for: VD25OH, VD125OH2TOT, CI6874NY7, CI7874NY7, 25OHVITD1, 25OHVITD2, 25OHVITD3, TESTOFREE, TESTOSTERONE  Inflammation (CRP: Acute Phase) (ESR: Chronic Phase) Lab Results  Component Value Date   ESRSEDRATE 8 09/07/2022         Note: Above Lab results reviewed.  Recent Imaging Review  VAS US  CAROTID Carotid Arterial Duplex Study  Patient Name:  ALANO BLASCO  Date of Exam:   05/25/2024 Medical Rec #: [id number]        Accession #:    [id number] Date of Birth: 01-02-1956        Patient Gender: M Patient Age:   32 years Exam Location:  [facility] Procedure:      VAS US  CAROTID Referring Phys: BARNIE HILA  --------------------------------------------------------------------------------   Indications:       Patient presents today with complaints of dizziness for the                    past several months and headaches x 5 days since having                    multiple injections in his neck for muscle tension. He denies                    any other cerebrovascular symptoms. Risk Factors:      Hypertension, hyperlipidemia, Diabetes, past history of  smoking, coronary artery disease. Other Factors:     CABG. Comparison Study:  NA  Performing Technologist: Nanetta Shad RVT    Examination Guidelines: A complete evaluation includes B-mode imaging, spectral Doppler, color Doppler, and power Doppler as needed of all accessible portions of each vessel. Bilateral testing is considered an integral part of a complete examination. Limited examinations for reoccurring indications may be performed as noted.    Right Carotid  Findings: +----------+--------+--------+--------+------------------+------------------+           PSV cm/sEDV cm/sStenosisPlaque DescriptionComments           +----------+--------+--------+--------+------------------+------------------+ CCA Prox  120     23                                                   +----------+--------+--------+--------+------------------+------------------+ CCA Mid   105     22      <50%    heterogenous                         +----------+--------+--------+--------+------------------+------------------+ CCA Distal89      14                                intimal thickening +----------+--------+--------+--------+------------------+------------------+ ICA Prox  74      13      1-39%   heterogenous                         +----------+--------+--------+--------+------------------+------------------+ ICA Distal103     26                                steep dive         +----------+--------+--------+--------+------------------+------------------+ ECA       140     16              heterogenous                         +----------+--------+--------+--------+------------------+------------------+  +----------+--------+-------+----------------+-------------------+           PSV cm/sEDV cmsDescribe        Arm Pressure (mmHG) +----------+--------+-------+----------------+-------------------+ Subclavian204     0      Multiphasic, WNL                    +----------+--------+-------+----------------+-------------------+  +---------+--------+--+--------+-+---------+ VertebralPSV cm/s39EDV cm/s7Antegrade +---------+--------+--+--------+-+---------+     Left Carotid Findings: +----------+--------+--------+--------+------------------+--------+           PSV cm/sEDV cm/sStenosisPlaque DescriptionComments +----------+--------+--------+--------+------------------+--------+ CCA Prox  106     17                                          +----------+--------+--------+--------+------------------+--------+ CCA Mid   107     19      <50%    heterogenous               +----------+--------+--------+--------+------------------+--------+ CCA Distal60      12      <50%    heterogenous               +----------+--------+--------+--------+------------------+--------+  ICA Prox  69      16      1-39%   heterogenous               +----------+--------+--------+--------+------------------+--------+ ICA Distal68      17                                         +----------+--------+--------+--------+------------------+--------+ ECA       67      6                                          +----------+--------+--------+--------+------------------+--------+  +----------+--------+--------+----------------+-------------------+           PSV cm/sEDV cm/sDescribe        Arm Pressure (mmHG) +----------+--------+--------+----------------+-------------------+ Subclavian127     0       Multiphasic, WNL                    +----------+--------+--------+----------------+-------------------+  +---------+--------+--+--------+--+---------+ VertebralPSV cm/s41EDV cm/s11Antegrade +---------+--------+--+--------+--+---------+        Summary: Right Carotid: Velocities in the right ICA are consistent with a 1-39% stenosis.                Non-hemodynamically significant plaque <50% noted in the CCA.  Left Carotid: Velocities in the left ICA are consistent with a 1-39% stenosis.               Non-hemodynamically significant plaque <50% noted in the CCA.  Vertebrals:  Bilateral vertebral arteries demonstrate antegrade flow. Subclavians: Normal flow hemodynamics were seen in bilateral subclavian              arteries.  *See table(s) above for measurements and observations.    Electronically signed by Deatrice Cage MD on 05/27/2024 at 8:07:38 AM.      Final    Note: Reviewed        Physical Exam  Vitals: BP 123/72   Pulse 90   Temp (!) 97.5 F (36.4 C)   Resp 16   Ht 6' (1.829 m)   Wt 200 lb (90.7 kg)   SpO2 97%   BMI 27.12 kg/m  BMI: Estimated body mass index is 27.12 kg/m as calculated from the following:   Height as of this encounter: 6' (1.829 m).   Weight as of this encounter: 200 lb (90.7 kg). Ideal: Ideal body weight: 77.6 kg (171 lb 1.2 oz) Adjusted ideal body weight: 82.8 kg (182 lb 10.3 oz) General appearance: Well nourished, well developed, and well hydrated. In no apparent acute distress Mental status: Alert, oriented x 3 (person, place, & time)       Respiratory: No evidence of acute respiratory distress Eyes: PERLA   Assessment   Diagnosis  1. Bilateral occipital neuralgia   2. Cervical radicular pain   3. Chronic pain syndrome      Updated Problems: No problems updated.  Plan of Care  Positive response to occipital nerve block.  Consider repeating in the future, also consider pulsed RFA of the occipital nerves. Proceed with right shoulder surgery.  Follow-up afterwards if needed   Return for patient will call to schedule F2F appt prn.    Recent Visits Date Type Provider Dept  05/28/24 Office Visit Marcelino Nurse, MD [facility]  05/20/24 Procedure visit Marcelino Nurse,  MD [facility]  Showing recent visits within past 90 days and meeting all other requirements Today's Visits Date Type Provider Dept  06/25/24 Office Visit Marcelino Nurse, MD [facility]  Showing today's visits and meeting all other requirements Future Appointments No visits were found meeting these conditions. Showing future appointments within next 90 days and meeting all other requirements  I discussed the assessment and treatment plan with the patient. The patient was provided an opportunity to ask questions and all were answered. The patient agreed with the plan and demonstrated an understanding of the  instructions.  Patient advised to call back or seek an in-person evaluation if the symptoms or condition worsens.  Duration of encounter: .  Total time on encounter, as per AMA guidelines included both the face-to-face and non-face-to-face time personally spent by the physician and/or other qualified health care professional(s) on the day of the encounter (includes time in activities that require the physician or other qualified health care professional and does not include time in activities normally performed by clinical staff). Physician's time may include the following activities when performed: Preparing to see the patient (e.g., pre-charting review of records, searching for previously ordered imaging, lab work, and nerve conduction tests) Review of prior analgesic pharmacotherapies. Reviewing PMP Interpreting ordered tests (e.g., lab work, imaging, nerve conduction tests) Performing post-procedure evaluations, including interpretation of diagnostic procedures Obtaining and/or reviewing separately obtained history Performing a medically appropriate examination and/or evaluation Counseling and educating the patient/family/caregiver Ordering medications, tests, or procedures Referring and communicating with other health care professionals (when not separately reported) Documenting clinical information in the electronic or other health record Independently interpreting results (not separately reported) and communicating results to the patient/ family/caregiver Care coordination (not separately reported)  Note by: Nurse Marcelino, MD (TTS and AI technology used. I apologize for any typographical errors that were not detected and corrected.) Date: 06/25/2024; Time: 10:50 AM

## 2024-06-25 NOTE — Progress Notes (Signed)
 Safety precautions to be maintained throughout the outpatient stay will include: orient to surroundings, keep bed in low position, maintain call bell within reach at all times, provide assistance with transfer out of bed and ambulation.

## 2024-06-28 ENCOUNTER — Other Ambulatory Visit: Payer: Self-pay

## 2024-06-28 MED ORDER — PIOGLITAZONE HCL 30 MG PO TABS
30.0000 mg | ORAL_TABLET | Freq: Every day | ORAL | 3 refills | Status: AC
  Filled 2024-06-28: qty 90, 90d supply, fill #0
  Filled 2024-10-04 (×2): qty 90, 90d supply, fill #1
  Filled 2025-01-04: qty 90, 90d supply, fill #2
  Filled 2025-01-04: qty 90, 90d supply, fill #0

## 2024-06-29 ENCOUNTER — Other Ambulatory Visit: Payer: Self-pay

## 2024-06-29 MED ORDER — TRAMADOL HCL 50 MG PO TABS
50.0000 mg | ORAL_TABLET | Freq: Four times a day (QID) | ORAL | 0 refills | Status: DC | PRN
  Filled 2024-06-29: qty 30, 8d supply, fill #0

## 2024-06-30 ENCOUNTER — Other Ambulatory Visit: Payer: Self-pay

## 2024-07-06 ENCOUNTER — Other Ambulatory Visit: Payer: Self-pay | Admitting: Surgery

## 2024-07-09 DIAGNOSIS — M7581 Other shoulder lesions, right shoulder: Secondary | ICD-10-CM | POA: Diagnosis not present

## 2024-07-09 DIAGNOSIS — M19021 Primary osteoarthritis, right elbow: Secondary | ICD-10-CM | POA: Diagnosis not present

## 2024-07-09 DIAGNOSIS — M7501 Adhesive capsulitis of right shoulder: Secondary | ICD-10-CM | POA: Diagnosis not present

## 2024-07-09 DIAGNOSIS — M879 Osteonecrosis, unspecified: Secondary | ICD-10-CM | POA: Diagnosis not present

## 2024-07-13 ENCOUNTER — Encounter
Admission: RE | Admit: 2024-07-13 | Discharge: 2024-07-13 | Disposition: A | Discharge status: Home / Self Care | Source: Ambulatory Visit | Attending: Surgery | Admitting: Surgery

## 2024-07-13 ENCOUNTER — Other Ambulatory Visit: Payer: Self-pay

## 2024-07-13 ENCOUNTER — Telehealth: Payer: Self-pay

## 2024-07-13 VITALS — BP 124/74 | HR 78 | Temp 97.7°F | Resp 18 | Ht 72.0 in | Wt 207.5 lb

## 2024-07-13 DIAGNOSIS — E113511 Type 2 diabetes mellitus with proliferative diabetic retinopathy with macular edema, right eye: Secondary | ICD-10-CM | POA: Diagnosis not present

## 2024-07-13 DIAGNOSIS — R8290 Unspecified abnormal findings in urine: Secondary | ICD-10-CM | POA: Diagnosis not present

## 2024-07-13 DIAGNOSIS — Z01812 Encounter for preprocedural laboratory examination: Secondary | ICD-10-CM | POA: Diagnosis not present

## 2024-07-13 DIAGNOSIS — R8281 Pyuria: Secondary | ICD-10-CM | POA: Diagnosis not present

## 2024-07-13 DIAGNOSIS — Z01818 Encounter for other preprocedural examination: Secondary | ICD-10-CM | POA: Diagnosis present

## 2024-07-13 DIAGNOSIS — R8271 Bacteriuria: Secondary | ICD-10-CM | POA: Insufficient documentation

## 2024-07-13 DIAGNOSIS — G8929 Other chronic pain: Secondary | ICD-10-CM

## 2024-07-13 HISTORY — DX: Atherosclerosis of aorta: I70.0

## 2024-07-13 HISTORY — DX: Type 2 diabetes mellitus without complications: E11.9

## 2024-07-13 HISTORY — DX: Long term (current) use of aspirin: Z79.82

## 2024-07-13 HISTORY — DX: Other spondylosis with radiculopathy, cervical region: M47.22

## 2024-07-13 LAB — COMPREHENSIVE METABOLIC PANEL WITH GFR
ALT: 24 U/L (ref 0–44)
AST: 30 U/L (ref 15–41)
Albumin: 3.8 g/dL (ref 3.5–5.0)
Alkaline Phosphatase: 83 U/L (ref 38–126)
Anion gap: 10 (ref 5–15)
BUN: 17 mg/dL (ref 8–23)
CO2: 30 mmol/L (ref 22–32)
Calcium: 9.8 mg/dL (ref 8.9–10.3)
Chloride: 97 mmol/L — ABNORMAL LOW (ref 98–111)
Creatinine, Ser: 0.8 mg/dL (ref 0.61–1.24)
GFR, Estimated: >60 mL/min (ref >60)
Glucose, Bld: 264 mg/dL — ABNORMAL HIGH (ref 70–99)
Potassium: 4.5 mmol/L (ref 3.5–5.1)
Sodium: 137 mmol/L (ref 135–145)
Total Bilirubin: 1.5 mg/dL — ABNORMAL HIGH (ref 0.0–1.2)
Total Protein: 7.2 g/dL (ref 6.5–8.1)

## 2024-07-13 LAB — CBC WITH DIFFERENTIAL/PLATELET
Abs Immature Granulocytes: 0.02 K/uL (ref 0.00–0.07)
Basophils Absolute: 0 K/uL (ref 0.0–0.1)
Basophils Relative: 0 %
Eosinophils Absolute: 0.2 K/uL (ref 0.0–0.5)
Eosinophils Relative: 2 %
HCT: 42.3 % (ref 39.0–52.0)
Hemoglobin: 14.4 g/dL (ref 13.0–17.0)
Immature Granulocytes: 0 %
Lymphocytes Relative: 14 %
Lymphs Abs: 1.3 K/uL (ref 0.7–4.0)
MCH: 30.4 pg (ref 26.0–34.0)
MCHC: 34 g/dL (ref 30.0–36.0)
MCV: 89.4 fL (ref 80.0–100.0)
Monocytes Absolute: 0.5 K/uL (ref 0.1–1.0)
Monocytes Relative: 6 %
Neutro Abs: 7.4 K/uL (ref 1.7–7.7)
Neutrophils Relative %: 78 %
Platelets: 200 K/uL (ref 150–400)
RBC: 4.73 MIL/uL (ref 4.22–5.81)
RDW: 13.7 % (ref 11.5–15.5)
WBC: 9.5 K/uL (ref 4.0–10.5)
nRBC: 0 % (ref 0.0–0.2)

## 2024-07-13 LAB — URINALYSIS, ROUTINE W REFLEX MICROSCOPIC
Bilirubin Urine: NEGATIVE
Glucose, UA: >=500 mg/dL — AB
Ketones, ur: NEGATIVE mg/dL
Nitrite: NEGATIVE
Protein, ur: NEGATIVE mg/dL
Specific Gravity, Urine: 1.026 (ref 1.005–1.030)
Squamous Epithelial / HPF: 0 /HPF (ref 0–5)
WBC, UA: >50 WBC/hpf (ref 0–5)
pH: 5 (ref 5.0–8.0)

## 2024-07-13 LAB — SURGICAL PCR SCREEN
MRSA, PCR: NEGATIVE
Staphylococcus aureus: POSITIVE — AB

## 2024-07-13 NOTE — Telephone Encounter (Signed)
 S/W pt and spouse and scheduled TELE Preop appt 07/16/24. Med Rec and Consent done

## 2024-07-13 NOTE — Telephone Encounter (Signed)
 Primary Cardiologist:Brian Agbor-Etang, MD   Preoperative team, please contact this patient and set up a phone call appointment for further preoperative risk assessment. Please obtain consent and complete medication review. Thank you for your help.   I confirm that guidance regarding antiplatelet and oral anticoagulation therapy has been completed and, if necessary, noted below (advised he can continue asa through perioperative period).   I also confirmed the patient resides in the state of [place] . As per [facility] Medical Board telemedicine laws, the patient must reside in the state in which the provider is licensed.   Rosaline EMERSON Bane, NP-C  07/13/2024, 9:43 AM [address], [address] [place], [place] [address] Office [phone] Fax [phone]

## 2024-07-13 NOTE — Telephone Encounter (Signed)
 Med Rec and Consent done     Patient Consent for Virtual Visit        REEGAN BOUFFARD has provided verbal consent on 07/13/2024 for a virtual visit (video or telephone).   CONSENT FOR VIRTUAL VISIT FOR:  Norleen VEAR Keel  By participating in this virtual visit I agree to the following:  I hereby voluntarily request, consent and authorize [facility] and its employed or contracted physicians, physician assistants, nurse practitioners or other licensed health care professionals (the Practitioner), to provide me with telemedicine health care services (the "Services) as deemed necessary by the treating Practitioner. I acknowledge and consent to receive the Services by the Practitioner via telemedicine. I understand that the telemedicine visit will involve communicating with the Practitioner through live audiovisual communication technology and the disclosure of certain medical information by electronic transmission. I acknowledge that I have been given the opportunity to request an in-person assessment or other available alternative prior to the telemedicine visit and am voluntarily participating in the telemedicine visit.  I understand that I have the right to withhold or withdraw my consent to the use of telemedicine in the course of my care at any time, without affecting my right to future care or treatment, and that the Practitioner or I may terminate the telemedicine visit at any time. I understand that I have the right to inspect all information obtained and/or recorded in the course of the telemedicine visit and may receive copies of available information for a reasonable fee.  I understand that some of the potential risks of receiving the Services via telemedicine include:  Delay or interruption in medical evaluation due to technological equipment failure or disruption; Information transmitted may not be sufficient (e.g. poor resolution of images) to allow for appropriate medical  decision making by the Practitioner; and/or  In rare instances, security protocols could fail, causing a breach of personal health information.  Furthermore, I acknowledge that it is my responsibility to provide information about my medical history, conditions and care that is complete and accurate to the best of my ability. I acknowledge that Practitioner's advice, recommendations, and/or decision may be based on factors not within their control, such as incomplete or inaccurate data provided by me or distortions of diagnostic images or specimens that may result from electronic transmissions. I understand that the practice of medicine is not an exact science and that Practitioner makes no warranties or guarantees regarding treatment outcomes. I acknowledge that a copy of this consent can be made available to me via my patient portal [facility] MyChart), or I can request a printed copy by calling the office of [facility].    I understand that my insurance will be billed for this visit.   I have read or had this consent read to me. I understand the contents of this consent, which adequately explains the benefits and risks of the Services being provided via telemedicine.  I have been provided ample opportunity to ask questions regarding this consent and the Services and have had my questions answered to my satisfaction. I give my informed consent for the services to be provided through the use of telemedicine in my medical care

## 2024-07-13 NOTE — Telephone Encounter (Signed)
-----   Message from Dorise CHARLENA Pereyra sent at 07/12/2024  6:05 PM EDT ----- Regarding: Request for pre-operative cardiac clearance Request for pre-operative cardiac clearance:  1. What type of surgery is being performed?  ARTHROPLASTY, SHOULDER, TOTAL, REVERSE  2. When is this surgery scheduled?  07/21/2024  3. Type of clearance being requested (medical, pharmacy, both)? MEDICAL   4. Are there any medications that need to be held prior to surgery? N/A - patient to continue daily low dose ASA throughout his perioperative course.   5. Practice name and name of physician performing surgery?  Performing surgeon: Dr. Norleen Maltos, MD Requesting clearance: Dorise Pereyra, FNP-C    6. Anesthesia type (none, local, MAC, general)? GENERAL  7. What is the office phone and fax number?   Phone: [phone] Fax: [phone]  ATTENTION: Unable to create telephone message as per your standard workflow. Directed by [facility] providers to send requests for cardiac clearance to this pool for appropriate distribution to provider covering pre-operative clearances.   Dorise Pereyra, MSN, APRN, FNP-C, CEN [facility]  Peri-operative Services Nurse Practitioner Phone: [phone] 07/12/24 6:05 PM

## 2024-07-13 NOTE — Telephone Encounter (Signed)
   Pre-operative Risk Assessment    Patient Name: Robert Fernandez  DOB: 07/19/1956 MRN: [id number]   Date of last office visit: 04/23/24 BARNIE HILA, NP Date of next office visit: 08/05/24 RYAN DUNN, PA   Request for Surgical Clearance    Procedure:  ARTHROPLASTY, SHOULDER, TOTAL, REVERSE (RIGHT)  Date of Surgery:  Clearance 07/21/24                                Surgeon:  DR NORLEEN MALTOS Surgeon's Group or Practice Name:  [facility] Phone number:  [phone] Fax number:  [phone]   Type of Clearance Requested:   - Medical  - Pharmacy:  Hold Aspirin (PER REQUEST N/A - patient to continue daily low dose ASA throughout his perioperative course)   Type of Anesthesia:  General    Additional requests/questions:    Signed, Lucie DELENA Ku   07/13/2024, 7:49 AM     Elnor Dorise BRAVO, NP  P Cv Div Preop Callback Request for pre-operative cardiac clearance:   1. What type of surgery is being performed? ARTHROPLASTY, SHOULDER, TOTAL, REVERSE  2. When is this surgery scheduled? 07/21/2024   3. Type of clearance being requested (medical, pharmacy, both)? MEDICAL   4. Are there any medications that need to be held prior to surgery? N/A - patient to continue daily low dose ASA throughout his perioperative course.  5. Practice name and name of physician performing surgery? Performing surgeon: Dr. Norleen MALTOS, MD Requesting clearance: Dorise Elnor, FNP-C     6. Anesthesia type (none, local, MAC, general)? GENERAL  7. What is the office phone and fax number?   Phone: [phone] Fax: [phone]  ATTENTION: Unable to create telephone message as per your standard workflow. Directed by [facility] providers to send requests for cardiac clearance to this pool for appropriate distribution to provider covering pre-operative clearances.  Dorise Elnor, MSN, APRN, FNP-C, CEN [facility] Peri-operative Services Nurse  Practitioner Phone: [phone] 07/12/24 6:05 PM

## 2024-07-13 NOTE — Patient Instructions (Addendum)
 Your procedure is scheduled on:  TUESDAY JULY 29  Report to the Registration Desk on the 1st floor of the [facility] To find out your arrival time, please call [phone] between 1PM - 3PM on:   MONDAY JULY 28  If your arrival time is 6:00 am, do not arrive before that time as the [facility] entrance doors do not open until 6:00 am.  REMEMBER: Instructions that are not followed completely may result in serious medical risk, up to and including death; or upon the discretion of your surgeon and anesthesiologist your surgery may need to be rescheduled.  Do not eat food after midnight the night before surgery.  No gum chewing or hard candies.  You may however, drink WATER up to 2 hours before you are scheduled to arrive for your surgery. Do not drink anything within 2 hours of your scheduled arrival time.  In addition, your doctor has ordered for you to drink the provided:   Gatorade G2 Drinking this carbohydrate drink up to two hours before surgery helps to reduce insulin  resistance and improve patient outcomes. Please complete drinking 2 hours before scheduled arrival time.  One week prior to surgery:  TUESDAY JULY 22 Stop Anti-inflammatories (NSAIDS) such as Advil, Aleve, Ibuprofen, Motrin, Naproxen, Naprosyn and Aspirin based products such as Excedrin, Goody's Powder, BC Powder. Stop ANY OVER THE COUNTER supplements until after surgery.  You may however, continue to take Tylenol  if needed for pain up until the day of surgery.  **Follow guidelines for insulin  and diabetes medications.** Semaglutide  Ozempic  hold for 7 days prior to surgery, last  dose MONDAY JULY 21  CONTACT Diona Floss, NP  [address] MEDICINE [place], [place] [address]  [phone] (Work)  REGARDING YOUR INSULIN  REGIMEN FOR THE DAY BEFORE AND THE DAY OF SURGERY   Continue taking all of your other prescription medications up until the day of surgery.  ON THE DAY OF SURGERY ONLY TAKE THESE MEDICATIONS WITH SIPS OF  WATER:  carvedilol  (COREG )  omeprazole  (PRILOSEC)   Use inhalers on the day of surgery and bring to the hospital. albuterol  (VENTOLIN  HFA)   No Alcohol for 24 hours before or after surgery.  Do not use any recreational drugs for at least a week (preferably 2 weeks) before your surgery.  Please be advised that the combination of cocaine and anesthesia may have negative outcomes, up to and including death. If you test positive for cocaine, your surgery will be cancelled.  On the morning of surgery brush your teeth with toothpaste and water, you may rinse your mouth with mouthwash if you wish. Do not swallow any toothpaste or mouthwash.  Use CHG Soap as directed on instruction sheet.  Do not wear jewelry, make-up, hairpins, clips or nail polish.  For welded (permanent) jewelry: bracelets, anklets, waist bands, etc.  Please have this removed prior to surgery.  If it is not removed, there is a chance that hospital personnel will need to cut it off on the day of surgery.  Do not wear lotions, powders, or perfumes.   Do not shave body hair from the neck down 48 hours before surgery.  Contact lenses, hearing aids and dentures may not be worn into surgery.  Do not bring valuables to the hospital. [facility] is not responsible for any missing/lost belongings or valuables.   Total Shoulder Arthroplasty:  use Benzoyl Peroxide 5% Gel as directed on instruction sheet.  Notify your doctor if there is any change in your medical  condition (cold, fever, infection).  Wear comfortable clothing (specific to your surgery type) to the hospital.  After surgery, you can help prevent lung complications by doing breathing exercises.  Take deep breaths and cough every 1-2 hours. Your doctor may order a device called an Incentive Spirometer to help you take deep breaths.  If you are being discharged the day of surgery, you will not be allowed to drive home. You will need a responsible individual to  drive you home and stay with you for 24 hours after surgery.   If you are taking public transportation, you will need to have a responsible individual with you.  Please call the Pre-admissions Testing Dept. at [phone] if you have any questions about these instructions.  Surgery Visitation Policy:  Patients having surgery or a procedure may have two visitors.  Children under the age of 66 must have an adult with them who is not the patient.   Merchandiser, retail to address health-related social needs:  [email or web address]    Pre-operative 5 CHG Bath Instructions   You can play a key role in reducing the risk of infection after surgery. Your skin needs to be as free of germs as possible. You can reduce the number of germs on your skin by washing with CHG (chlorhexidine  gluconate) soap before surgery. CHG is an antiseptic soap that kills germs and continues to kill germs even after washing.   DO NOT use if you have an allergy to chlorhexidine /CHG or antibacterial soaps. If your skin becomes reddened or irritated, stop using the CHG and notify one of our RNs at [phone].   Please shower with the CHG soap starting 4 days before surgery using the following schedule:    STARTING FRIDAY JULY 25     Please keep in mind the following:  DO NOT shave, including legs and underarms, starting the day of your first shower.   You may shave your face at any point before/day of surgery.  Place clean sheets on your bed the day you start using CHG soap. Use a clean washcloth (not used since being washed) for each shower. DO NOT sleep with pets once you start using the CHG.   CHG Shower Instructions:  If you choose to wash your hair and private area, wash first with your normal shampoo/soap.  After you use shampoo/soap, rinse your hair and body thoroughly to remove shampoo/soap residue.  Turn the water OFF and apply about 3 tablespoons (45 ml) of CHG soap to a CLEAN  washcloth.  Apply CHG soap ONLY FROM YOUR NECK DOWN TO YOUR TOES (washing for 3-5 minutes)  DO NOT use CHG soap on face, private areas, open wounds, or sores.  Pay special attention to the area where your surgery is being performed.  If you are having back surgery, having someone wash your back for you may be helpful. Wait 2 minutes after CHG soap is applied, then you may rinse off the CHG soap.  Pat dry with a clean towel  Put on clean clothes/pajamas   If you choose to wear lotion, please use ONLY the CHG-compatible lotions on the back of this paper.     Additional instructions for the day of surgery: DO NOT APPLY any lotions, deodorants, cologne, or perfumes.   Put on clean/comfortable clothes.  Brush your teeth.  Ask your nurse before applying any prescription medications to the skin.      CHG Compatible Lotions   Aveeno Moisturizing lotion  Cetaphil Moisturizing Cream  Cetaphil Moisturizing Lotion  Clairol Herbal Essence Moisturizing Lotion, Dry Skin  Clairol Herbal Essence Moisturizing Lotion, Extra Dry Skin  Clairol Herbal Essence Moisturizing Lotion, Normal Skin  Curel Age Defying Therapeutic Moisturizing Lotion with Alpha Hydroxy  Curel Extreme Care Body Lotion  Curel Soothing Hands Moisturizing Hand Lotion  Curel Therapeutic Moisturizing Cream, Fragrance-Free  Curel Therapeutic Moisturizing Lotion, Fragrance-Free  Curel Therapeutic Moisturizing Lotion, Original Formula  Eucerin Daily Replenishing Lotion  Eucerin Dry Skin Therapy Plus Alpha Hydroxy Crme  Eucerin Dry Skin Therapy Plus Alpha Hydroxy Lotion  Eucerin Original Crme  Eucerin Original Lotion  Eucerin Plus Crme Eucerin Plus Lotion  Eucerin TriLipid Replenishing Lotion  Keri Anti-Bacterial Hand Lotion  Keri Deep Conditioning Original Lotion Dry Skin Formula Softly Scented  Keri Deep Conditioning Original Lotion, Fragrance Free Sensitive Skin Formula  Keri Lotion Fast Absorbing Fragrance Free Sensitive  Skin Formula  Keri Lotion Fast Absorbing Softly Scented Dry Skin Formula  Keri Original Lotion  Keri Skin Renewal Lotion Keri Silky Smooth Lotion  Keri Silky Smooth Sensitive Skin Lotion  Nivea Body Creamy Conditioning Oil  Nivea Body Extra Enriched Lotion  Nivea Body Original Lotion  Nivea Body Sheer Moisturizing Lotion Nivea Crme  Nivea Skin Firming Lotion  NutraDerm 30 Skin Lotion  NutraDerm Skin Lotion  NutraDerm Therapeutic Skin Cream  NutraDerm Therapeutic Skin Lotion  ProShield Protective Hand Cream  Provon moisturizing lotion      Preparing for Total Shoulder Arthroplasty  Before surgery, you can play an important role by reducing the number of germs on your skin by using the following products:  Benzoyl Peroxide Gel  o Reduces the number of germs present on the skin  o Applied twice a day to shoulder area starting two days before surgery  Chlorhexidine  Gluconate (CHG) Soap  o An antiseptic cleaner that kills germs and bonds with the skin to continue killing germs even after washing  o Used for showering the night before surgery and morning of surgery  BENZOYL PEROXIDE 5% GEL  Please do not use if you have an allergy to benzoyl peroxide. If your skin becomes reddened/irritated stop using the benzoyl peroxide.  Starting two days before surgery, apply as follows:  1. Apply benzoyl peroxide in the morning and at night. Apply after taking a shower. If you are not taking a shower, clean entire shoulder front, back, and side along with the armpit with a clean wet washcloth.  2. Place a quarter-sized dollop on your shoulder and rub in thoroughly, making sure to cover the front, back, and side of your shoulder, along with the armpit.  2 days before __X__ AM ___X_ PM 1 day before ___X_ AM ___X_ PM  3. Do this twice a day for two days. (Last application is the night before surgery, AFTER using the CHG soap).  4. Do NOT apply benzoyl peroxide gel on the day of  surgery.      How to Use an Incentive Spirometer  An incentive spirometer is a tool that measures how well you are filling your lungs with each breath. Learning to take long, deep breaths using this tool can help you keep your lungs clear and active. This may help to reverse or lessen your chance of developing breathing (pulmonary) problems, especially infection. You may be asked to use a spirometer: After a surgery. If you have a lung problem or a history of smoking. After a long period of time when you have been unable to move or be active. If the spirometer  includes an indicator to show the highest number that you have reached, your health care provider or respiratory therapist will help you set a goal. Keep a log of your progress as told by your health care provider. What are the risks? Breathing too quickly may cause dizziness or cause you to pass out. Take your time so you do not get dizzy or light-headed. If you are in pain, you may need to take pain medicine before doing incentive spirometry. It is harder to take a deep breath if you are having pain. How to use your incentive spirometer  Sit up on the edge of your bed or on a chair. Hold the incentive spirometer so that it is in an upright position. Before you use the spirometer, breathe out normally. Place the mouthpiece in your mouth. Make sure your lips are closed tightly around it. Breathe in slowly and as deeply as you can through your mouth, causing the piston or the ball to rise toward the top of the chamber. Hold your breath for 3-5 seconds, or for as long as possible. If the spirometer includes a coach indicator, use this to guide you in breathing. Slow down your breathing if the indicator goes above the marked areas. Remove the mouthpiece from your mouth and breathe out normally. The piston or ball will return to the bottom of the chamber. Rest for a few seconds, then repeat the steps 10 or more times. Take your time and  take a few normal breaths between deep breaths so that you do not get dizzy or light-headed. Do this every 1-2 hours when you are awake. If the spirometer includes a goal marker to show the highest number you have reached (best effort), use this as a goal to work toward during each repetition. After each set of 10 deep breaths, cough a few times. This will help to make sure that your lungs are clear. If you have an incision on your chest or abdomen from surgery, place a pillow or a rolled-up towel firmly against the incision when you cough. This can help to reduce pain while taking deep breaths and coughing. General tips When you are able to get out of bed: Walk around often. Continue to take deep breaths and cough in order to clear your lungs. Keep using the incentive spirometer until your health care provider says it is okay to stop using it. If you have been in the hospital, you may be told to keep using the spirometer at home. Contact a health care provider if: You are having difficulty using the spirometer. You have trouble using the spirometer as often as instructed. Your pain medicine is not giving enough relief for you to use the spirometer as told. You have a fever. Get help right away if: You develop shortness of breath. You develop a cough with bloody mucus from the lungs. You have fluid or blood coming from an incision site after you cough. Summary An incentive spirometer is a tool that can help you learn to take long, deep breaths to keep your lungs clear and active. You may be asked to use a spirometer after a surgery, if you have a lung problem or a history of smoking, or if you have been inactive for a long period of time. Use your incentive spirometer as instructed every 1-2 hours while you are awake. If you have an incision on your chest or abdomen, place a pillow or a rolled-up towel firmly against your incision when you cough.  This will help to reduce pain. Get help right  away if you have shortness of breath, you cough up bloody mucus, or blood comes from your incision when you cough. This information is not intended to replace advice given to you by your health care provider. Make sure you discuss any questions you have with your health care provider. Document Revised: 02/29/2020 Document Reviewed: 02/29/2020 Elsevier Patient Education  2023 [employer]vier Inc.                Preoperative Educational Videos for Total Hip, Knee and Shoulder Replacements  To better prepare for surgery, please view our videos that explain the physical activity and discharge planning required to have the best surgical recovery at [facility].  [email or web address]  Questions? Call [phone] or email [email or web address]

## 2024-07-14 ENCOUNTER — Ambulatory Visit: Payer: Self-pay | Admitting: Urgent Care

## 2024-07-14 LAB — HEMOGLOBIN A1C
Hgb A1c MFr Bld: 9.4 % — ABNORMAL HIGH (ref 4.8–5.6)
Mean Plasma Glucose: 223.08 mg/dL

## 2024-07-14 NOTE — Progress Notes (Signed)
  Perioperative Services: Pre-Admission/Anesthesia Testing  Abnormal Lab Notification    Date: 07/14/24  Name: Robert Fernandez MRN:   [id number]  Re: Abnormal labs noted during PAT appointment   Provider(s) Notified: Poggi, Norleen PARAS, MD Notification mode: Routed and/or faxed via [facility]   ABNORMAL LAB VALUE(S): Lab Results  Component Value Date   GLUCOSE 264 (H) 07/13/2024    Planned procedure: ARTHROPLASTY, SHOULDER, TOTAL, REVERSE (Right: Shoulder)  Notes: Patient with a T2DM diagnosis. He is currently on both oral (empagliflozin  + pioglitazone ) and parenteral (insulin  degludec + semaglutide ) therapies. Hgb A1c rechecked and found to be elevated at  9.4% (previously 8.5% in 03/2024). In efforts to reduce the risk of developing SSI/PJI, or other potential perioperative complications, this communication is being sent in order to determine if patient is deemed to be adequately optimized  for surgery.   In light of the aforementioned A1c, his diabetes could pose increased risks for the patient during their perioperative course and subsequent recovery period. With that being said, the benefit of improving glycemic control must be weighed against the overall risks associated with delaying a necessary elective surgical procedure for this patient.   Dorise Pereyra, MSN, APRN, FNP-C, CEN [facility]  Perioperative Services Nurse Practitioner Phone: [phone] Fax: [phone] 07/14/24 8:57 AM

## 2024-07-15 LAB — URINE CULTURE: Culture: <10000 — AB

## 2024-07-16 ENCOUNTER — Other Ambulatory Visit: Payer: Self-pay

## 2024-07-16 ENCOUNTER — Ambulatory Visit: Attending: Cardiology | Admitting: Emergency Medicine

## 2024-07-16 ENCOUNTER — Encounter: Payer: Self-pay | Admitting: Surgery

## 2024-07-16 DIAGNOSIS — Z01810 Encounter for preprocedural cardiovascular examination: Secondary | ICD-10-CM

## 2024-07-16 NOTE — Progress Notes (Signed)
 Virtual Visit via Telephone Note   Because of Robert Fernandez co-morbid illnesses, he is at least at moderate risk for complications without adequate follow up.  This format is felt to be most appropriate for this patient at this time.  Due to technical limitations with video connection (technology), today's appointment will be conducted as an audio only telehealth visit, and Robert Fernandez verbally agreed to proceed in this manner.   All issues noted in this document were discussed and addressed.  No physical exam could be performed with this format.  Evaluation Performed:  Preoperative cardiovascular risk assessment _____________   Date:  07/16/2024   Patient ID:  Robert Fernandez, Robert Fernandez Feb 18, 1956, MRN [id number] Patient Location:  Home Provider location:   Office  Primary Care Provider:  Miriam Rocky Shams, MD Primary Cardiologist:  Redell Cave, MD  Chief Complaint / Patient Profile   68 y.o. y/o male with a h/o coronary artery disease, hypertension, hyperlipidemia, obstructive sleep apnea, cirrhosis, T2DM, DVT who is pending right total shoulder arthroplasty on 07/21/2024 with [facility] and presents today for telephonic preoperative cardiovascular risk assessment.  History of Present Illness    Robert Fernandez Robert Fernandez is a 68 y.o. male who presents via audio/video conferencing for a telehealth visit today.  Pt was last seen in cardiology clinic on 04/23/2024 by Barnie, NP.  At that time Robert Fernandez was was having some dizziness and his carvedilol  was decreased..  The patient is now pending procedure as outlined above. Since his last visit, he denies chest pain, shortness of breath, lower extremity edema, fatigue, palpitations, melena, hematuria, hemoptysis, diaphoresis, weakness, presyncope, syncope, orthopnea, and PND.  Today patient is doing well overall.  He is without acute cardiovascular concerns or complaints today.  He denies any chest pains or dyspnea.  He  tells me his dizziness has entirely resolved since reduction of his carvedilol .  He feels much better overall.  He is relatively active which is limited by his severe right shoulder pain.  He continues with household chores, walking, several steps daily, and is able to walk a block without exertional symptoms.  Overall he is able to complete greater than 4 METS.  Past Medical History    Past Medical History:  Diagnosis Date   Aortic atherosclerosis (HCC)    Arthritis    Asthma    Bilateral occipital neuralgia 05/20/2024   Cervical spondylosis with radiculopathy    Cirrhosis of liver (HCC)    Coronary artery disease    Diabetic retinopathy of both eyes (HCC)    Dysfunction of Eustachian tube, bilateral 09/29/2020   Erectile dysfunction 08/13/2019   Facial droop 11/06/2018   GERD (gastroesophageal reflux disease)    Hepatosplenomegaly    History of deep venous thrombosis    Hyperlipidemia    Hypertension    Left adrenal mass (HCC)    Long-term use of aspirin therapy    Meningioma (HCC)    Migraines    Neovascular glaucoma, indeterminate stage, left    Neuropathy    Nocturnal leg cramps 09/29/2020   Obstructive sleep apnea    a.) unable to tolerate nocturnal PAP therapy d/t eye condition   Osteoarthritis    Osteopenia    Plantar fasciitis 09/06/2019   PONV (postoperative nausea and vomiting)    Raised intraocular pressure of right eye 09/12/2022   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   a.) LIMA-LAD, SVG-LCx --> procedure complication by MSSA bacteremia secondary to MRSE sternal wound infection  Slurred speech 11/06/2018   SOBOE (shortness of breath on exertion) 07/15/2020   T2DM (type 2 diabetes mellitus) (HCC)    Past Surgical History:  Procedure Laterality Date   ACHILLES TENDON REPAIR     BLEPHAROPLASTY UPPER EYELID      CARDIAC CATHETERIZATION  05/24/16   CORONARY ANGIOPLASTY     CORONARY ARTERY BYPASS GRAFT N/A 05/25/2016   FINGER AMPUTATION Left    INSERTION AQUEOUS SHUNT       KNEE ARTHROSCOPY Left    LAPAROSCOPIC CHOLECYSTECTOMY     LENS EYE SURGERY      LUMBAR DISC SURGERY     l4-5   ROTATOR CUFF REPAIR Left    SHOULDER ARTHROSCOPY WITH SUBACROMIAL DECOMPRESSION, ROTATOR CUFF REPAIR AND BICEP TENDON REPAIR Right 12/17/2023   Procedure: RIGHT SHOULDER ARTHROSCOPY WITH DEBRIDEMENT, DECOMPRESSION, ROTATOR CUFF REPAIR, BICEPS TENODESIS, MANIPULATION UNDER ANESTHESIA.;  Surgeon: Edie Norleen PARAS, MD;  Location: [facility];  Service: Orthopedics;  Laterality: Right;   TIBIA FRACTURE SURGERY      Allergies  Allergies  Allergen Reactions   Rosuvastatin Other (See Comments)    Cramps   Augmentin [Amoxicillin-Pot Clavulanate]     Hypotension    Codeine Other (See Comments)    Altered mental state   Dilaudid [Hydromorphone Hcl] Other (See Comments)    Hallucinations    Oxycodone -Acetaminophen  Other (See Comments)    Other Reaction(s): Unknown    Home Medications    Prior to Admission medications   Medication Sig Start Date End Date Taking? Authorizing Provider  acetaminophen  (TYLENOL ) 500 MG tablet Take 500 mg by mouth every 6 (six) hours as needed for moderate pain (pain score 4-6).    [provider]  albuterol  (VENTOLIN  HFA) 108 (90 Base) MCG/ACT inhaler Inhale 2 puffs into the lungs every 4 (four) hours as needed. 02/04/24     aspirin EC 81 MG tablet Take 81 mg by mouth daily. 06/22/19   [provider]  brimonidine  (ALPHAGAN ) 0.2 % ophthalmic solution Place 1 drop into both eyes 2 (two) times daily. Patient taking differently: Place 1 drop into the left eye 2 (two) times daily. 11/28/23     carvedilol  (COREG ) 3.125 MG tablet Take 1 tablet (3.125 mg total) by mouth 2 (two) times daily with a meal. 04/23/24 04/18/25  Loistine Sober, NP  Continuous Blood Gluc Receiver (FREESTYLE LIBRE 14 DAY READER) DEVI Use as directed 02/14/23     Continuous Glucose Sensor (FREESTYLE LIBRE 14 DAY SENSOR) MISC for glucose monitoring 02/14/23     empagliflozin   (JARDIANCE ) 25 MG TABS tablet Take 1 tablet (25 mg total) by mouth daily with breakfast. 02/12/24     Evolocumab  (REPATHA  SURECLICK) 140 MG/ML SOAJ Inject 140 mg under the skin every 14 (fourteen) days. 12/13/23   Darliss Rogue, MD  furosemide  (LASIX ) 20 MG tablet Take 1 tablet (20 mg total) by mouth daily. 11/13/23     gabapentin  (NEURONTIN ) 300 MG capsule Take 1 capsule (300 mg total) by mouth 2 (two) times daily AND 3 capsules (900 mg total) at bedtime. Patient taking differently: Take 300 mg the morning 1200 mg at bedtime 10/03/23     insulin  degludec (TRESIBA  FLEXTOUCH) 100 UNIT/ML FlexTouch Pen Inject 20 Units into the skin daily.    [provider]  Insulin  Pen Needle (UNIFINE PENTIPS PLUS) 31G X 6 MM MISC use as directed 01/21/23     meclizine  (ANTIVERT ) 25 MG tablet Take 1 tablet (25 mg total) by mouth 3 (three) times daily as needed  for dizziness 09/03/23     nitroGLYCERIN  (NITROSTAT ) 0.4 MG SL tablet Place 1 tablet (0.4 mg total) under the tongue every 5 (five) minutes as needed for chest pain. Max is 3 doses per incident. 04/23/24 07/22/24  Loistine Sober, NP  omeprazole  (PRILOSEC) 40 MG capsule Take 1 capsule (40 mg total) by mouth 2 (two) times daily  before meals. 03/28/23     pioglitazone  (ACTOS ) 30 MG tablet Take 1 tablet (30 mg total) by mouth daily. 06/25/24     Povidone, PF, (IVIZIA DRY EYES) 0.5 % SOLN Place 1 drop into the right eye 2 (two) times daily.    [provider]  Semaglutide , 2 MG/DOSE, (OZEMPIC , 2 MG/DOSE,) 8 MG/3ML SOPN Inject 2 mg into the skin once a week. 12/31/23     spironolactone  (ALDACTONE ) 50 MG tablet Take 1 tablet (50 mg total) by mouth daily. 11/18/23     SUMAtriptan  (IMITREX ) 50 MG tablet Take 50 mg by mouth every 2 (two) hours as needed for migraine. May repeat in 2 hours if headache persists or recurs.    [provider]  tiZANidine  (ZANAFLEX ) 4 MG tablet Take 1 tablet (4 mg total) by mouth every 8 (eight) hours as needed for  muscle spasms. 05/28/24 08/26/24  Marcelino Nurse, MD  traMADol  (ULTRAM ) 50 MG tablet Take 1 tablet (50 mg total) by mouth every 6 (six) hours as needed for pain. 06/29/24     verapamil  (VERELAN ) 360 MG 24 hr capsule Take 1 capsule (360 mg total) by mouth at bedtime. 06/19/24       Physical Exam    Vital Signs:  HELIODORO DOMAGALSKI does not have vital signs available for review today.  Given telephonic nature of communication, physical exam is limited. AAOx3. NAD. Normal affect.  Speech and respirations are unlabored.  Accessory Clinical Findings    None  Assessment & Plan    1.  Preoperative Cardiovascular Risk Assessment: According to the Revised Cardiac Risk Index (RCRI), his Perioperative Risk of Major Cardiac Event is (%): 6.6. His Functional Capacity in METs is: 5.07 according to the Duke Activity Status Index (DASI). Therefore, based on ACC/AHA guidelines, patient would be at acceptable risk for the planned procedure without further cardiovascular testing.   The patient was advised that if he develops new symptoms prior to surgery to contact our office to arrange for a follow-up visit, and he verbalized understanding.  Patient to continue daily low-dose aspirin throughout his perioperative course  A copy of this note will be routed to requesting surgeon.  Time:   Today, I have spent 6 minutes with the patient with telehealth technology discussing medical history, symptoms, and management plan.     Lum LITTIE Louis, NP  07/16/2024, 10:16 AM

## 2024-07-17 ENCOUNTER — Encounter: Payer: Self-pay | Admitting: Surgery

## 2024-07-17 ENCOUNTER — Other Ambulatory Visit: Payer: Self-pay

## 2024-07-17 MED ORDER — TRAMADOL HCL 50 MG PO TABS
50.0000 mg | ORAL_TABLET | Freq: Four times a day (QID) | ORAL | 0 refills | Status: DC | PRN
  Filled 2024-07-17: qty 30, 8d supply, fill #0

## 2024-07-17 MED ORDER — SPIRONOLACTONE 50 MG PO TABS
50.0000 mg | ORAL_TABLET | Freq: Every day | ORAL | 0 refills | Status: AC
  Filled 2024-07-17: qty 90, 90d supply, fill #0

## 2024-07-17 NOTE — Progress Notes (Signed)
 Perioperative / Anesthesia Services  Pre-Admission Testing Clinical Review / Pre-Operative Anesthesia Consult  Date: 07/17/24  PATIENT DEMOGRAPHICS: Name: Robert Fernandez DOB: Jan 24, 1956 MRN:   [id number]  Note: Available PAT nursing documentation and vital signs have been reviewed. Clinical nursing staff has updated patient's PMH/PSHx, current medication list, and drug allergies/intolerances to ensure complete and comprehensive history available to assist care teams in MDM as it pertains to the aforementioned surgical procedure and anticipated anesthetic course. Extensive review of available clinical information personally performed. [place] PMH and PSHx updated with any diagnoses/procedures that  may have been inadvertently omitted during his intake with the pre-admission testing department's nursing staff.  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) [M87.9]     Tendinitis of upper biceps tendon of right shoulder [M75.21]     Rotator cuff tendinitis, right [M75.81]     Nontraumatic complete tear of right rotator cuff [M75.121]     Adhesive capsulitis of right shoulder [M75.01]     Degenerative tear of glenoid labrum of right shoulder [M24.111]   Pre-op diagnosis:      Osteonecrosis of right head of humerus M87.9     Tendinitis of upper biceps tendon of right shoulder M75.21     Rotator cuff tendinitis, right M75.81     Nontraumatic complete tear of right rotator cuff M75.121     Adhesive capsulitis of right shoulder M75.01     Degenerative tear of glenoid labrum of right shoulder M24.111   Location: [facility] OR ROOM 02 / [facility]S FOR ANESTHESIA [facility]   Surgeons: Edie Robert PARAS, MD        CLINICAL DISCUSSION: Robert Fernandez is a 68 y.o. male who is submitted for pre-surgical anesthesia review  and clearance prior to him undergoing the above procedure. Patient is a Former Games developer. Pertinent PMH includes: CAD (s/p CABG), diastolic dysfunction, TIA, aortic atherosclerosis, HTN, HLD, T2DM, DOE, asthma, OSAH (unable to tolerate nocturnal PAP therapy), GERD (on daily PPI), hiatal hernia, cirrhosis, hepatosplenomegaly, OA, cervical spondylosis with radiculopathy, osteonecrosis of the RIGHT humeral head, RIGHT rotator cuff tear, neovascular glaucoma, lumbar DDD.  Patient is followed by cardiology Thera, MD). He was last seen in the cardiology clinic on 04/23/2024; notes reviewed. At the time of his clinic visit, patient.  Patient reported a 2 to 43-month history of unexplained vertiginous symptoms associated with position changes.  Patient also complained of exertional dyspnea and discomfort in the left side of his chest.  Cardiac symptoms are self-limiting and lasts approximately 2 minutes; unrelated to exertion.  Patient experiences edema in his BILATERAL lower extremities that he attributed to issues with his spine.  He denied any PND, orthopnea, palpitations, weakness, fatigue, or presyncope/syncope. Patient with a past medical history significant for cardiovascular diagnoses. Documented physical exam was grossly benign, providing no evidence of acute exacerbation and/or decompensation of the patient's known cardiovascular conditions.  Of note, complete records regarding patient's cardiovascular history unavailable for review at time of consult.  Information gathered from patient report and from notes provided by his specialty care providers.  Most recent myocardial perfusion imaging study was performed on 08/02/2015 revealing a  normal left ventricular systolic function with a hyperdynamic LVEF of 69%. There were no regional wall motion abnormalities.  No artifact or left ventricular cavity size enlargement appreciated on review of  imaging. SPECT images demonstrated no evidence of stress-induced  myocardial ischemia or arrhythmia; no scintigraphic evidence of scar.  TID ratio = 1.08. Study determined to be normal and low risk.  Patient underwent diagnostic LEFT heart catheterization on 05/23/2016 at [facility] with Dr. Dallas Quivers, DO.  Invasive coronary testing revealed a 99% occlusion of the left main with no associated distal disease.  He was referred to CVTS for consideration of revascularization.  Patient underwent two-vessel revascularization procedure on 05/25/2016.  LIMA-LAD and SVG-LCx bypass grafts were placed.  Procedure was complicated by development of MSSA bacteremia and a significant MRSE sternal wound infection.  Patient had an extended hospital admission, however was ultimately discharged in stable condition for outpatient follow-up with cardiology.  Patient presented with complaints of unilateral facial drooping and slurred speech on 11/06/2018.  Imaging of the brain was negative for acute intracranial abnormality and LAAO.  Patient diagnosed with TIA.  Patient with no residual deficits, symptoms following this neurological event.  Most recent TTE performed on 01/09/2024 revealed a normal left ventricular systolic function with an EF of 55-60%. There were no regional wall motion abnormalities. Left ventricular diastolic Doppler parameters consistent with abnormal relaxation (G1DD). Right ventricular size and function normal with a TAPSE measuring 1.4 cm  (normal range >/= 1.6 cm). There was no significant valvular regurgitation.  All transvalvular gradients were noted to be normal providing no evidence of hemodynamically significant valvular stenosis. Aorta normal in size with no evidence of ectasia or aneurysmal dilatation.  Patient with a history of mild BILATERAL carotid artery disease.  Most recent carotid Doppler study performed on 05/25/2024 revealed a 1-39% stenosis of the patient's BILATERAL internal carotid arteries.  Vertebrals demonstrated antegrade flow.  Normal  flow hemodynamics seen in the subclavian arteries.  Blood pressure well controlled at 118/52 mmHg on currently prescribed CCB (verapamil ), beta-blocker (carvedilol ) and diuretic (spironolactone  + furosemide ) therapies.  Patient is on PCSK9i (evolocumab ) for his HLD diagnosis and ASCVD prevention. T2DM poorly controlled when patient was last seen by his cardiologist; A1c 8.5% when checked on 04/06/2024.  Of note, since patient was seen by his cardiologist, A1c has been rechecked with further worsening to 9.4% on 07/13/2024. In the setting of known cardiovascular diagnoses and concurrent T2DM, patient is taking an SGLT2i (empagliflozin ) for added cardiovascular and renovascular protection.  Patient does have an OSAH diagnosis, however due to his neovascular glaucoma (NVG), patient is unable to tolerate his prescribed nocturnal PAP therapy. Functional capacity somewhat limited by patient's age and multiple medical comorbidities.  Additionally, as previously reported, patient having unexplained vertiginous symptoms with position changes.  With that said, patient able to complete all of his ADLs/IADLs without significant cardiovascular limitation.  Per the DASI, patient is able to exceed 4 METS of physical activity without experiencing any significant bruits of angina/anginal equivalent symptoms.  Persistent symptoms felt to be medication induced; beta-blocker (carvedilol ) dose decreased.  Given patient's complaints of intermittent chest discomfort, patient was provided with a prescription for short acting nitrate therapy (NTG) to use on a as needed basis for recurrent angina/anginal equivalent symptoms.  No other changes were made to his medication regimen.  Patient follow-up with outpatient cardiology in 3 months or sooner if needed.  Robert Fernandez is scheduled for an elective ARTHROPLASTY, SHOULDER, TOTAL, REVERSE (Right: Shoulder) on 07/21/2024 with Dr. Norleen Maltos, MD.  Given patient's past medical history  significant for cardiovascular diagnoses, presurgical cardiac clearance was sought by the PAT team.  Per cardiology, according to the  Revised Cardiac Risk Index (RCRI), his Perioperative Risk of Major Cardiac Event is (%): 6.6. His Functional Capacity in METs is: 5.07 according to the [employer] Activity Status Index (DASI). Therefore, based on ACC/AHA guidelines, patient would be at ACCEPTABLE risk for the planned procedure without further cardiovascular testing.   In review of the patient's chart, it is noted that he is on daily oral antithrombotic therapy. Given that patient's past medical history is significant for cardiovascular diagnoses, including but not limited to CAD, orthopedics has cleared patient to continue his daily low dose ASA throughout his perioperative course.  Patient has been updated on these directives from his specialty care providers by the PAT team.  Patient reports previous perioperative complications with anesthesia in the past. Patient has a PMH (+) for PONV. Symptoms and history of PONV will be discussed with patient by anesthesia team on the day of her procedure. Interventions will be ordered as deemed necessary based on patient's individual care needs as determined by anesthesiologist. In review his EMR, it is noted that patient underwent a general anesthetic course here at [facility] ([facility]) in 11/2023 without documented complications.   MOST RECENT VITAL SIGNS:    07/13/2024    8:03 AM 06/25/2024    9:25 AM 05/28/2024    1:41 PM  Vitals with BMI  Height 6' 0 6' 0 6' 0  Weight 207 lbs 8 oz 200 lbs 200 lbs  BMI 28.14 27.12 27.12  Systolic 124 123 878  Diastolic 74 72 64  Pulse 78 90 75   PROVIDERS/SPECIALISTS: NOTE: Primary physician provider listed below. Patient may have been seen by APP or partner within same practice.   PROVIDER ROLE / SPECIALTY LAST OV  Poggi, Robert PARAS, MD Orthopedics (Surgeon) 06/08/2024  Miriam Rocky Shams,  MD Primary Care Provider 04/06/2024 12/17/2023  Darliss Rogue, MD Cardiology 12/17/2023; preop APP call 07/16/2024  Marcelino Nurse, MD Pain management 06/25/2024   ALLERGIES: Allergies  Allergen Reactions   Rosuvastatin Other (See Comments)    Cramps   Augmentin [Amoxicillin-Pot Clavulanate]     Hypotension    Codeine Other (See Comments)    Altered mental state   Dilaudid [Hydromorphone Hcl] Other (See Comments)    Hallucinations    Oxycodone -Acetaminophen  Other (See Comments)    Other Reaction(s): Unknown    CURRENT HOME MEDICATIONS: No current facility-administered medications for this encounter.    acetaminophen  (TYLENOL ) 500 MG tablet   albuterol  (VENTOLIN  HFA) 108 (90 Base) MCG/ACT inhaler   aspirin  EC 81 MG tablet   brimonidine  (ALPHAGAN ) 0.2 % ophthalmic solution   carvedilol  (COREG ) 3.125 MG tablet   empagliflozin  (JARDIANCE ) 25 MG TABS tablet   Evolocumab  (REPATHA  SURECLICK) 140 MG/ML SOAJ   furosemide  (LASIX ) 20 MG tablet   gabapentin  (NEURONTIN ) 300 MG capsule   insulin  degludec (TRESIBA  FLEXTOUCH) 100 UNIT/ML FlexTouch Pen   meclizine  (ANTIVERT ) 25 MG tablet   nitroGLYCERIN  (NITROSTAT ) 0.4 MG SL tablet   omeprazole  (PRILOSEC) 40 MG capsule   pioglitazone  (ACTOS ) 30 MG tablet   Povidone, PF, (IVIZIA DRY EYES) 0.5 % SOLN   Semaglutide , 2 MG/DOSE, (OZEMPIC , 2 MG/DOSE,) 8 MG/3ML SOPN   spironolactone  (ALDACTONE ) 50 MG tablet   SUMAtriptan  (IMITREX ) 50 MG tablet   tiZANidine  (ZANAFLEX ) 4 MG tablet   verapamil  (VERELAN ) 360 MG 24 hr capsule   Continuous Blood Gluc Receiver (FREESTYLE LIBRE 14 DAY READER) DEVI   Continuous Glucose Sensor (FREESTYLE LIBRE 14 DAY SENSOR) MISC   Insulin  Pen Needle (  UNIFINE PENTIPS PLUS) 31G X 6 MM MISC   traMADol  (ULTRAM ) 50 MG tablet   HISTORY: Past Medical History:  Diagnosis Date   Aortic atherosclerosis (HCC)    Arthritis    Asthma    Bacteremia due to methicillin susceptible Staphylococcus aureus (MSSA) 05/2016   a.)  s/p CABG   Bilateral occipital neuralgia 05/20/2024   Cervical spondylosis with radiculopathy    Cirrhosis of liver (HCC)    Coronary artery disease    DDD (degenerative disc disease), lumbar    Diabetic retinopathy of both eyes (HCC)    Diastolic dysfunction    Dysfunction of Eustachian tube, bilateral 09/29/2020   Erectile dysfunction 08/13/2019   GERD (gastroesophageal reflux disease)    Hepatosplenomegaly    Hiatal hernia    History of deep venous thrombosis    Hyperlipidemia    Hypertension    Left adrenal mass (HCC)    Long-term use of aspirin  therapy    Meningioma (HCC)    Methicillin resistant Staphylococcus epidermidis infection 05/2016   a.) sternal wound infection s/p CABG   Migraines    Neovascular glaucoma, indeterminate stage, left    Neuropathy    Nocturnal leg cramps 09/29/2020   Nontraumatic complete tear of right rotator cuff    Obstructive sleep apnea    a.) unable to tolerate nocturnal PAP therapy d/t eye condition   Osteoarthritis    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   Past Surgical History:  Procedure Laterality Date   ACHILLES TENDON REPAIR     BLEPHAROPLASTY UPPER EYELID      CORONARY ARTERY BYPASS GRAFT N/A 05/25/2016   FINGER AMPUTATION Left    INSERTION AQUEOUS SHUNT      KNEE ARTHROSCOPY Left    LAPAROSCOPIC CHOLECYSTECTOMY     LEFT HEART CATH AND CORONARY ANGIOGRAPHY Left 05/24/2016   LENS EYE SURGERY      LUMBAR DISC  SURGERY     l4-5   ROTATOR CUFF REPAIR Left    SHOULDER ARTHROSCOPY WITH SUBACROMIAL DECOMPRESSION, ROTATOR CUFF REPAIR AND BICEP TENDON REPAIR Right 12/17/2023   Procedure: RIGHT SHOULDER ARTHROSCOPY WITH DEBRIDEMENT, DECOMPRESSION, ROTATOR CUFF REPAIR, BICEPS TENODESIS, MANIPULATION UNDER ANESTHESIA.;  Surgeon: Edie Robert PARAS, MD;  Location: [facility] ORS;  Service: Orthopedics;  Laterality: Right;   TIBIA FRACTURE SURGERY     Family History  Problem Relation Age of Onset   Breast cancer Mother    Heart attack Father    Hypertension Father    Breast cancer Sister    Heart disease Maternal Grandmother    Social History   Tobacco Use   Smoking status: Former    Types: Cigarettes   Smokeless tobacco: Never  Substance Use Topics   Alcohol use: Never   LABS:  Hospital Outpatient Visit on 07/13/2024  Component Date Value Ref Range Status   MRSA, PCR 07/13/2024 NEGATIVE  NEGATIVE Final   Staphylococcus aureus 07/13/2024 POSITIVE (A)  NEGATIVE Final  Comment: (NOTE) The Xpert SA Assay (FDA approved for NASAL specimens in patients 50 years of age and older), is one component of a comprehensive surveillance program. It is not intended to diagnose infection nor to guide or monitor treatment. Performed at [facility], [address] [place], [place] [address]    WBC 07/13/2024 9.5  4.0 - 10.5 K/uL Final   RBC 07/13/2024 4.73  4.22 - 5.81 MIL/uL Final   Hemoglobin 07/13/2024 14.4  13.0 - 17.0 g/dL Final   HCT 92/78/7974 42.3  39.0 - 52.0 % Final   MCV 07/13/2024 89.4  80.0 - 100.0 fL Final   MCH 07/13/2024 30.4  26.0 - 34.0 pg Final   MCHC 07/13/2024 34.0  30.0 - 36.0 g/dL Final   RDW 92/78/7974 13.7  11.5 - 15.5 % Final   Platelets 07/13/2024 200  150 - 400 K/uL Final   nRBC 07/13/2024 0.0  0.0 - 0.2 % Final   Neutrophils Relative % 07/13/2024 78  % Final   Neutro Abs 07/13/2024 7.4  1.7 - 7.7 K/uL Final   Lymphocytes Relative 07/13/2024 14  % Final   Lymphs Abs  07/13/2024 1.3  0.7 - 4.0 K/uL Final   Monocytes Relative 07/13/2024 6  % Final   Monocytes Absolute 07/13/2024 0.5  0.1 - 1.0 K/uL Final   Eosinophils Relative 07/13/2024 2  % Final   Eosinophils Absolute 07/13/2024 0.2  0.0 - 0.5 K/uL Final   Basophils Relative 07/13/2024 0  % Final   Basophils Absolute 07/13/2024 0.0  0.0 - 0.1 K/uL Final   Immature Granulocytes 07/13/2024 0  % Final   Abs Immature Granulocytes 07/13/2024 0.02  0.00 - 0.07 K/uL Final   Performed at [facility], [address] [place], [place] [address]   Sodium 07/13/2024 137  135 - 145 mmol/L Final   Potassium 07/13/2024 4.5  3.5 - 5.1 mmol/L Final   Chloride 07/13/2024 97 (L)  98 - 111 mmol/L Final   CO2 07/13/2024 30  22 - 32 mmol/L Final   Glucose, Bld 07/13/2024 264 (H)  70 - 99 mg/dL Final   Glucose reference range applies only to samples taken after fasting for at least 8 hours.   BUN 07/13/2024 17  8 - 23 mg/dL Final   Creatinine, Ser 07/13/2024 0.80  0.61 - 1.24 mg/dL Final   Calcium 92/78/7974 9.8  8.9 - 10.3 mg/dL Final   Total Protein 92/78/7974 7.2  6.5 - 8.1 g/dL Final   Albumin 92/78/7974 3.8  3.5 - 5.0 g/dL Final   AST 92/78/7974 30  15 - 41 U/L Final   ALT 07/13/2024 24  0 - 44 U/L Final   Alkaline Phosphatase 07/13/2024 83  38 - 126 U/L Final   Total Bilirubin 07/13/2024 1.5 (H)  0.0 - 1.2 mg/dL Final   GFR, Estimated 07/13/2024 >60  >60 mL/min Final   Comment: (NOTE) Calculated using the CKD-EPI Creatinine Equation (2021)    Anion gap 07/13/2024 10  5 - 15 Final   Performed at [facility], [address] [place], [place] [address]   Color, Urine 07/13/2024 YELLOW (A)  YELLOW Final   APPearance 07/13/2024 HAZY (A)  CLEAR Final   Specific Gravity, Urine 07/13/2024 1.026  1.005 - 1.030 Final   pH 07/13/2024 5.0  5.0 - 8.0 Final   Glucose, UA 07/13/2024 >=500 (A)  NEGATIVE mg/dL Final   Hgb urine dipstick 07/13/2024 SMALL (A)  NEGATIVE Final   Bilirubin Urine 07/13/2024  NEGATIVE  NEGATIVE Final   Ketones, ur 07/13/2024 NEGATIVE  NEGATIVE mg/dL Final   Protein, ur 92/78/7974 NEGATIVE  NEGATIVE mg/dL Final   Nitrite 92/78/7974 NEGATIVE  NEGATIVE Final   Leukocytes,Ua 07/13/2024 SMALL (A)  NEGATIVE Final   RBC / HPF 07/13/2024 0-5  0 - 5 RBC/hpf Final   WBC, UA 07/13/2024 >50  0 - 5 WBC/hpf Final   Bacteria, UA 07/13/2024 RARE (A)  NONE SEEN Final   Squamous Epithelial / HPF 07/13/2024 0  0 - 5 /HPF Final   Mucus 07/13/2024 PRESENT   Final   Performed at [facility], [address], [place], [place] [address]   Specimen Description 07/13/2024    Final                   Value:URINE, CLEAN CATCH Performed at [facility], [address] [place], [place] [address]    Special Requests 07/13/2024    Final                   Value:NONE Performed at [facility], [address], [place], [place] [address]    Culture 07/13/2024  (A)   Final                   Value:<10,000 COLONIES/mL INSIGNIFICANT GROWTH Performed at [facility], [address] [place], [place] [address]    Report Status 07/13/2024 07/15/2024 FINAL   Final   Hgb A1c MFr Bld 07/13/2024 9.4 (H)  4.8 - 5.6 % Final   Comment: (NOTE) Diagnosis of Diabetes The following HbA1c ranges recommended by the American Diabetes Association (ADA) may be used as an aid in the diagnosis of diabetes mellitus.  Hemoglobin             Suggested A1C NGSP%              Diagnosis  <5.7                   Non Diabetic  5.7-6.4                Pre-Diabetic  >6.4                   Diabetic  <7.0                   Glycemic control for                       adults with diabetes.     Mean Plasma Glucose 07/13/2024 223.08  mg/dL Final   Performed at [facility], [address] [place], [place] [address]    ECG: Date: 04/23/2024  Time ECG obtained: 1120 AM Rate: 75 bpm Rhythm: normal sinus Axis (leads I and aVF): normal Intervals: PR 144 ms. QRS 96 ms. QTc 444  ms. ST segment and T wave changes: No evidence of acute T wave abnormalities or significant ST segment elevation or depression.  Evidence of a possible, age undetermined, prior infarct:  No Comparison: Similar to previous tracing obtained on 02/07/2024   IMAGING / PROCEDURES: VAS US  CAROTID performed on 05/25/2024 Velocities in the right ICA are consistent with a 1-39% stenosis. Non-hemodynamically significant plaque <50% noted in the CCA.  Velocities in the left ICA are consistent with a 1-39% stenosis. Non-hemodynamically significant plaque <50% noted in the CCA.  Bilateral vertebral arteries demonstrate antegrade flow.  Normal flow hemodynamics were  seen in bilateral subclavian arteries.   TRANSTHORACIC ECHOCARDIOGRAM performed on 01/09/2024 Left ventricular ejection fraction, by estimation, is 55 to 60%. The left ventricle has normal function. The left ventricle has no regional wall motion abnormalities. Left ventricular diastolic parameters are consistent with Grade I diastolic dysfunction (impaired relaxation).  Right ventricular systolic function is normal. The right ventricular size is normal.  The mitral valve is normal in structure. No evidence of mitral valve regurgitation. No evidence of mitral stenosis.  The aortic valve is normal in structure. Aortic valve regurgitation is not visualized. No aortic stenosis is present.  The inferior vena cava is normal in size with greater than 50% respiratory variability, suggesting right atrial pressure of 3 mmHg.   MR SHOULDER RIGHT WO CONTRAST performed on 05/01/2024 Status post anterior supraspinatus rotator cuff repair. The anterior supraspinatus tendon footprint tear is markedly improved. There may be a thin horizontal linear 4 mm partial-thickness residual articular sided tear at the mid to posterior supraspinatus tendon, versus artifact. This is small and of likely low clinical significance. The prior anterior high-grade partial-thickness  and full-thickness tear appears resolved/intact. Mild-to-moderate supraspinatus muscle atrophy is unchanged. Interval acromioplasty, now with type II acromion and resolution of the prior anterolateral downsloping of the acromion. The proximal long head of the biceps tendon is no longer well visualized proximal to the bicipital groove. Question interval biceps tenotomy versus tenodesis. 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 No overlying cortical collapse.   CT ABDOMEN PELVIS WO CONTRAST performed on 02/07/2024 Punctate nonobstructing right renal stone. No hydronephrosis or renal inflammation. Cirrhotic hepatic morphology Small hiatal hernia. Faint tree-in-bud nodular opacities in both lower lobes, likely infectious or inflammatory. Aortic atherosclerosis   IMPRESSION AND PLAN: Robert Fernandez has been referred for pre-anesthesia review and clearance prior to him undergoing the planned anesthetic and procedural courses. Available labs, pertinent testing, and imaging results were personally reviewed by me in preparation for upcoming operative/procedural course. [facility] medical record has been updated following extensive record review and patient interview with PAT staff.   This patient has been appropriately cleared by cardiology with an overall ACCEPTABLE risk of patient experiencing significant perioperative cardiovascular complications. Based on clinical review performed today (07/17/24), barring any significant acute changes in the patient's overall condition, it is anticipated that he will be able to proceed with the planned surgical intervention. Any acute changes in clinical condition may necessitate his procedure being postponed and/or cancelled. Patient will meet with anesthesia team (MD and/or CRNA) on the day of his procedure for preoperative evaluation/assessment. Questions  regarding anesthetic course will be fielded at that time.   Pre-surgical instructions were reviewed with the patient during his PAT appointment, and questions were fielded to satisfaction by PAT clinical staff. He has been instructed on which medications that he will need to hold prior to surgery, as well as the ones that have been deemed safe/appropriate to take on the day of his procedure. As part of the general education provided by PAT, patient made aware both verbally and in writing, that he would need to abstain from the use of any illegal substances during his perioperative course. He was advised that failure to follow the provided instructions could necessitate case cancellation or result in serious perioperative complications up to and including death. Patient encouraged to contact PAT and/or his surgeon's office to discuss any questions or concerns that may  arise prior to surgery; verbalized understanding.   Robert Pereyra, MSN, APRN, FNP-C, CEN [facility]  Perioperative Services Nurse Practitioner Phone: [phone] Fax: [phone] 07/17/24 8:56 AM  NOTE: This note has been prepared using Dragon dictation software. Despite my best ability to proofread, there is always the potential that unintentional transcriptional errors may still occur from this process.

## 2024-07-18 ENCOUNTER — Encounter: Payer: Self-pay | Admitting: Surgery

## 2024-07-20 ENCOUNTER — Other Ambulatory Visit: Payer: Self-pay

## 2024-07-20 DIAGNOSIS — E279 Disorder of adrenal gland, unspecified: Secondary | ICD-10-CM | POA: Diagnosis not present

## 2024-07-20 DIAGNOSIS — I10 Essential (primary) hypertension: Secondary | ICD-10-CM | POA: Diagnosis not present

## 2024-07-20 DIAGNOSIS — E785 Hyperlipidemia, unspecified: Secondary | ICD-10-CM | POA: Diagnosis not present

## 2024-07-20 DIAGNOSIS — E1165 Type 2 diabetes mellitus with hyperglycemia: Secondary | ICD-10-CM | POA: Diagnosis not present

## 2024-07-20 DIAGNOSIS — Z794 Long term (current) use of insulin: Secondary | ICD-10-CM | POA: Diagnosis not present

## 2024-07-20 MED ORDER — FREESTYLE LIBRE 3 PLUS SENSOR MISC
3 refills | Status: AC
  Filled 2024-07-20: qty 6, 90d supply, fill #0
  Filled 2024-07-20: qty 2, 30d supply, fill #0
  Filled 2024-12-29: qty 6, 90d supply, fill #1
  Filled 2024-12-29: qty 2, 30d supply, fill #1
  Filled 2025-01-26: qty 6, 90d supply, fill #0

## 2024-07-20 MED ORDER — PRECISION QID TEST VI STRP
ORAL_STRIP | 4 refills | Status: AC
  Filled 2024-07-20: qty 300, 90d supply, fill #0

## 2024-07-20 MED ORDER — LANCETS 33G MISC
4 refills | Status: AC
  Filled 2024-07-20: qty 300, 90d supply, fill #0

## 2024-07-20 MED ORDER — INSULIN LISPRO (1 UNIT DIAL) 100 UNIT/ML (KWIKPEN)
3.0000 [IU] | PEN_INJECTOR | Freq: Three times a day (TID) | SUBCUTANEOUS | 3 refills | Status: AC
  Filled 2024-07-20: qty 60, 90d supply, fill #0
  Filled 2024-12-29: qty 15, 25d supply, fill #1

## 2024-07-20 MED ORDER — AUM MINI INSULIN PEN NEEDLE 32G X 6 MM MISC
3 refills | Status: AC
  Filled 2024-07-20: qty 400, 90d supply, fill #0

## 2024-07-20 MED ORDER — FREESTYLE LIBRE 3 READER DEVI
0 refills | Status: AC
  Filled 2024-07-20: qty 1, 1d supply, fill #0

## 2024-07-20 MED ORDER — FREESTYLE FREEDOM LITE W/DEVICE KIT
PACK | 0 refills | Status: AC
  Filled 2024-07-20: qty 1, 30d supply, fill #0

## 2024-07-21 ENCOUNTER — Ambulatory Visit: Payer: Self-pay | Admitting: Urgent Care

## 2024-07-21 ENCOUNTER — Other Ambulatory Visit: Payer: Self-pay

## 2024-07-21 ENCOUNTER — Ambulatory Visit

## 2024-07-21 ENCOUNTER — Encounter: Payer: Self-pay | Admitting: Surgery

## 2024-07-21 ENCOUNTER — Encounter
Admission: RE | Disposition: A | Discharge status: Home / Self Care | Payer: Self-pay | Source: Home / Self Care | Attending: Surgery

## 2024-07-21 ENCOUNTER — Ambulatory Visit
Admission: RE | Admit: 2024-07-21 | Discharge: 2024-07-22 | Disposition: A | Discharge status: Home / Self Care | Attending: Surgery | Admitting: Surgery

## 2024-07-21 DIAGNOSIS — G4730 Sleep apnea, unspecified: Secondary | ICD-10-CM | POA: Diagnosis not present

## 2024-07-21 DIAGNOSIS — R0602 Shortness of breath: Secondary | ICD-10-CM | POA: Insufficient documentation

## 2024-07-21 DIAGNOSIS — G43909 Migraine, unspecified, not intractable, without status migrainosus: Secondary | ICD-10-CM | POA: Insufficient documentation

## 2024-07-21 DIAGNOSIS — Z7985 Long-term (current) use of injectable non-insulin antidiabetic drugs: Secondary | ICD-10-CM | POA: Insufficient documentation

## 2024-07-21 DIAGNOSIS — M19011 Primary osteoarthritis, right shoulder: Secondary | ICD-10-CM | POA: Diagnosis not present

## 2024-07-21 DIAGNOSIS — Z7984 Long term (current) use of oral hypoglycemic drugs: Secondary | ICD-10-CM | POA: Insufficient documentation

## 2024-07-21 DIAGNOSIS — K449 Diaphragmatic hernia without obstruction or gangrene: Secondary | ICD-10-CM | POA: Diagnosis not present

## 2024-07-21 DIAGNOSIS — Z86718 Personal history of other venous thrombosis and embolism: Secondary | ICD-10-CM | POA: Diagnosis not present

## 2024-07-21 DIAGNOSIS — G4733 Obstructive sleep apnea (adult) (pediatric): Secondary | ICD-10-CM | POA: Insufficient documentation

## 2024-07-21 DIAGNOSIS — E119 Type 2 diabetes mellitus without complications: Secondary | ICD-10-CM | POA: Insufficient documentation

## 2024-07-21 DIAGNOSIS — M069 Rheumatoid arthritis, unspecified: Secondary | ICD-10-CM | POA: Insufficient documentation

## 2024-07-21 DIAGNOSIS — G8918 Other acute postprocedural pain: Secondary | ICD-10-CM | POA: Diagnosis not present

## 2024-07-21 DIAGNOSIS — Z79899 Other long term (current) drug therapy: Secondary | ICD-10-CM | POA: Insufficient documentation

## 2024-07-21 DIAGNOSIS — M7501 Adhesive capsulitis of right shoulder: Secondary | ICD-10-CM | POA: Insufficient documentation

## 2024-07-21 DIAGNOSIS — Z87891 Personal history of nicotine dependence: Secondary | ICD-10-CM | POA: Insufficient documentation

## 2024-07-21 DIAGNOSIS — Z96611 Presence of right artificial shoulder joint: Secondary | ICD-10-CM

## 2024-07-21 DIAGNOSIS — Z471 Aftercare following joint replacement surgery: Secondary | ICD-10-CM | POA: Diagnosis not present

## 2024-07-21 DIAGNOSIS — Z01812 Encounter for preprocedural laboratory examination: Secondary | ICD-10-CM

## 2024-07-21 DIAGNOSIS — E785 Hyperlipidemia, unspecified: Secondary | ICD-10-CM | POA: Insufficient documentation

## 2024-07-21 DIAGNOSIS — M75121 Complete rotator cuff tear or rupture of right shoulder, not specified as traumatic: Secondary | ICD-10-CM | POA: Insufficient documentation

## 2024-07-21 DIAGNOSIS — M7521 Bicipital tendinitis, right shoulder: Secondary | ICD-10-CM | POA: Insufficient documentation

## 2024-07-21 DIAGNOSIS — I10 Essential (primary) hypertension: Secondary | ICD-10-CM | POA: Insufficient documentation

## 2024-07-21 DIAGNOSIS — Z9889 Other specified postprocedural states: Secondary | ICD-10-CM | POA: Diagnosis not present

## 2024-07-21 DIAGNOSIS — Z794 Long term (current) use of insulin: Secondary | ICD-10-CM | POA: Insufficient documentation

## 2024-07-21 DIAGNOSIS — I2510 Atherosclerotic heart disease of native coronary artery without angina pectoris: Secondary | ICD-10-CM | POA: Insufficient documentation

## 2024-07-21 DIAGNOSIS — Z7982 Long term (current) use of aspirin: Secondary | ICD-10-CM | POA: Insufficient documentation

## 2024-07-21 DIAGNOSIS — M879 Osteonecrosis, unspecified: Secondary | ICD-10-CM | POA: Insufficient documentation

## 2024-07-21 DIAGNOSIS — E113511 Type 2 diabetes mellitus with proliferative diabetic retinopathy with macular edema, right eye: Secondary | ICD-10-CM

## 2024-07-21 DIAGNOSIS — Z833 Family history of diabetes mellitus: Secondary | ICD-10-CM | POA: Diagnosis not present

## 2024-07-21 DIAGNOSIS — K219 Gastro-esophageal reflux disease without esophagitis: Secondary | ICD-10-CM | POA: Diagnosis not present

## 2024-07-21 DIAGNOSIS — K7460 Unspecified cirrhosis of liver: Secondary | ICD-10-CM | POA: Insufficient documentation

## 2024-07-21 DIAGNOSIS — E1140 Type 2 diabetes mellitus with diabetic neuropathy, unspecified: Secondary | ICD-10-CM | POA: Insufficient documentation

## 2024-07-21 DIAGNOSIS — J45909 Unspecified asthma, uncomplicated: Secondary | ICD-10-CM | POA: Insufficient documentation

## 2024-07-21 DIAGNOSIS — M7581 Other shoulder lesions, right shoulder: Secondary | ICD-10-CM | POA: Diagnosis not present

## 2024-07-21 DIAGNOSIS — M24111 Other articular cartilage disorders, right shoulder: Secondary | ICD-10-CM | POA: Diagnosis not present

## 2024-07-21 DIAGNOSIS — E11319 Type 2 diabetes mellitus with unspecified diabetic retinopathy without macular edema: Secondary | ICD-10-CM | POA: Insufficient documentation

## 2024-07-21 DIAGNOSIS — Z8673 Personal history of transient ischemic attack (TIA), and cerebral infarction without residual deficits: Secondary | ICD-10-CM | POA: Insufficient documentation

## 2024-07-21 DIAGNOSIS — Z951 Presence of aortocoronary bypass graft: Secondary | ICD-10-CM | POA: Insufficient documentation

## 2024-07-21 DIAGNOSIS — M87011 Idiopathic aseptic necrosis of right shoulder: Secondary | ICD-10-CM | POA: Diagnosis not present

## 2024-07-21 HISTORY — DX: Other specified health status: Z78.9

## 2024-07-21 HISTORY — PX: REVERSE SHOULDER ARTHROPLASTY: SHX5054

## 2024-07-21 HISTORY — PX: STERIOD INJECTION: SHX5046

## 2024-07-21 HISTORY — DX: Complete rotator cuff tear or rupture of right shoulder, not specified as traumatic: M75.121

## 2024-07-21 HISTORY — DX: Diaphragmatic hernia without obstruction or gangrene: K44.9

## 2024-07-21 HISTORY — DX: Posterior tibial tendinitis, unspecified leg: M76.829

## 2024-07-21 HISTORY — DX: Osteonecrosis, unspecified: M87.9

## 2024-07-21 HISTORY — DX: Other ill-defined heart diseases: I51.89

## 2024-07-21 HISTORY — DX: Other intervertebral disc degeneration, lumbar region without mention of lumbar back pain or lower extremity pain: M51.369

## 2024-07-21 LAB — GLUCOSE, CAPILLARY
Glucose-Capillary: 225 mg/dL — ABNORMAL HIGH (ref 70–99)
Glucose-Capillary: 184 mg/dL — ABNORMAL HIGH (ref 70–99)
Glucose-Capillary: 276 mg/dL — ABNORMAL HIGH (ref 70–99)
Glucose-Capillary: 182 mg/dL — ABNORMAL HIGH (ref 70–99)

## 2024-07-21 SURGERY — ARTHROPLASTY, SHOULDER, TOTAL, REVERSE
Anesthesia: General | Site: Shoulder | Laterality: Right

## 2024-07-21 MED ORDER — ACETAMINOPHEN 10 MG/ML IV SOLN
INTRAVENOUS | Status: AC
Start: 2024-07-21 — End: 2024-07-21
  Filled 2024-07-21: qty 100

## 2024-07-21 MED ORDER — ACETAMINOPHEN 325 MG PO TABS: 325.0000 mg | ORAL_TABLET | Freq: Four times a day (QID) | ORAL | Status: DC | PRN

## 2024-07-21 MED ORDER — CEFAZOLIN SODIUM-DEXTROSE 2-4 GM/100ML-% IV SOLN
2.0000 g | Freq: Four times a day (QID) | INTRAVENOUS | Status: AC
  Administered 2024-07-21 (×2): 2 g via INTRAVENOUS
  Filled 2024-07-21 (×2): qty 100

## 2024-07-21 MED ORDER — MIDAZOLAM HCL 2 MG/2ML IJ SOLN
INTRAMUSCULAR | Status: AC
  Filled 2024-07-21: qty 2

## 2024-07-21 MED ORDER — TRANEXAMIC ACID-NACL 1000-0.7 MG/100ML-% IV SOLN
INTRAVENOUS | Status: AC
  Filled 2024-07-21: qty 100

## 2024-07-21 MED ORDER — SUMATRIPTAN SUCCINATE 50 MG PO TABS: 50.0000 mg | ORAL_TABLET | ORAL | Status: DC | PRN

## 2024-07-21 MED ORDER — MIDAZOLAM HCL 2 MG/2ML IJ SOLN
1.0000 mg | INTRAMUSCULAR | Status: DC | PRN
  Administered 2024-07-21: 1 mg via INTRAVENOUS

## 2024-07-21 MED ORDER — MIDAZOLAM HCL 2 MG/2ML IJ SOLN
INTRAMUSCULAR | Status: DC | PRN
  Administered 2024-07-21 (×2): 1 mg via INTRAVENOUS

## 2024-07-21 MED ORDER — PROPOFOL 10 MG/ML IV BOLUS
INTRAVENOUS | Status: AC
  Filled 2024-07-21: qty 20

## 2024-07-21 MED ORDER — PHENYLEPHRINE 80 MCG/ML (10ML) SYRINGE FOR IV PUSH (FOR BLOOD PRESSURE SUPPORT)
PREFILLED_SYRINGE | INTRAVENOUS | Status: DC | PRN
  Administered 2024-07-21 (×3): 80 ug via INTRAVENOUS

## 2024-07-21 MED ORDER — FLEET ENEMA RE ENEM: 1.0000 | ENEMA | Freq: Once | RECTAL | Status: DC | PRN

## 2024-07-21 MED ORDER — BUPIVACAINE-EPINEPHRINE (PF) 0.5% -1:200000 IJ SOLN
INTRAMUSCULAR | Status: AC
  Filled 2024-07-21: qty 30

## 2024-07-21 MED ORDER — BUPIVACAINE HCL (PF) 0.5 % IJ SOLN
INTRAMUSCULAR | Status: AC
  Filled 2024-07-21: qty 10

## 2024-07-21 MED ORDER — BUPIVACAINE LIPOSOME 1.3 % IJ SUSP
INTRAMUSCULAR | Status: AC
Start: 2024-07-21 — End: 2024-07-21
  Filled 2024-07-21: qty 20

## 2024-07-21 MED ORDER — METOCLOPRAMIDE HCL 10 MG PO TABS: 5.0000 mg | ORAL_TABLET | Freq: Three times a day (TID) | ORAL | Status: DC | PRN

## 2024-07-21 MED ORDER — SPIRONOLACTONE 25 MG PO TABS
50.0000 mg | ORAL_TABLET | Freq: Every day | ORAL | Status: DC
  Administered 2024-07-21 – 2024-07-22 (×2): 50 mg via ORAL
  Filled 2024-07-21 (×2): qty 2

## 2024-07-21 MED ORDER — GABAPENTIN 300 MG PO CAPS
ORAL_CAPSULE | ORAL | Status: AC
  Filled 2024-07-21: qty 3

## 2024-07-21 MED ORDER — ORAL CARE MOUTH RINSE
15.0000 mL | OROMUCOSAL | Status: DC | PRN
Start: 2024-07-21 — End: 2024-07-22

## 2024-07-21 MED ORDER — SUCCINYLCHOLINE CHLORIDE 200 MG/10ML IV SOSY
PREFILLED_SYRINGE | INTRAVENOUS | Status: DC | PRN
Start: 2024-07-21 — End: 2024-07-21
  Administered 2024-07-21: 60 mg via INTRAVENOUS

## 2024-07-21 MED ORDER — BUPIVACAINE HCL (PF) 0.5 % IJ SOLN
INTRAMUSCULAR | Status: AC
  Filled 2024-07-21: qty 30

## 2024-07-21 MED ORDER — GABAPENTIN 300 MG PO CAPS
900.0000 mg | ORAL_CAPSULE | Freq: Every day | ORAL | Status: DC
  Administered 2024-07-21: 900 mg via ORAL

## 2024-07-21 MED ORDER — VERAPAMIL HCL ER 180 MG PO TBCR
360.0000 mg | EXTENDED_RELEASE_TABLET | Freq: Every day | ORAL | Status: DC
  Filled 2024-07-21: qty 2

## 2024-07-21 MED ORDER — ROCURONIUM BROMIDE 100 MG/10ML IV SOLN
INTRAVENOUS | Status: DC | PRN
  Administered 2024-07-21: 5 mg via INTRAVENOUS

## 2024-07-21 MED ORDER — BUPIVACAINE HCL (PF) 0.5 % IJ SOLN
INTRAMUSCULAR | Status: DC | PRN
  Administered 2024-07-21: 10 mL via PERINEURAL

## 2024-07-21 MED ORDER — ONDANSETRON HCL 4 MG/2ML IJ SOLN
INTRAMUSCULAR | Status: DC | PRN
  Administered 2024-07-21: 4 mg via INTRAVENOUS

## 2024-07-21 MED ORDER — ONDANSETRON HCL 4 MG PO TABS: 4.0000 mg | ORAL_TABLET | Freq: Four times a day (QID) | ORAL | Status: DC | PRN

## 2024-07-21 MED ORDER — ALBUTEROL SULFATE (2.5 MG/3ML) 0.083% IN NEBU: 3.0000 mL | INHALATION_SOLUTION | RESPIRATORY_TRACT | Status: DC | PRN

## 2024-07-21 MED ORDER — SODIUM CHLORIDE 0.9 % IV SOLN: INTRAVENOUS | Status: DC

## 2024-07-21 MED ORDER — INSULIN ASPART 100 UNIT/ML IJ SOLN
0.0000 [IU] | Freq: Three times a day (TID) | INTRAMUSCULAR | Status: DC
  Administered 2024-07-21: 8 [IU] via SUBCUTANEOUS
  Administered 2024-07-22: 5 [IU] via SUBCUTANEOUS
  Filled 2024-07-21 (×2): qty 1

## 2024-07-21 MED ORDER — EMPAGLIFLOZIN 25 MG PO TABS
25.0000 mg | ORAL_TABLET | Freq: Every day | ORAL | Status: DC
  Administered 2024-07-22: 25 mg via ORAL
  Filled 2024-07-21: qty 1

## 2024-07-21 MED ORDER — CEFAZOLIN SODIUM-DEXTROSE 2-4 GM/100ML-% IV SOLN
INTRAVENOUS | Status: AC
  Filled 2024-07-21: qty 100

## 2024-07-21 MED ORDER — INSULIN ASPART 100 UNIT/ML IJ SOLN
5.0000 [IU] | Freq: Once | INTRAMUSCULAR | Status: AC
  Administered 2024-07-21: 5 [IU] via SUBCUTANEOUS

## 2024-07-21 MED ORDER — PROPOFOL 10 MG/ML IV BOLUS
INTRAVENOUS | Status: DC | PRN
  Administered 2024-07-21: 120 mg via INTRAVENOUS
  Administered 2024-07-21 (×2): 30 mg via INTRAVENOUS

## 2024-07-21 MED ORDER — GLYCOPYRROLATE 0.2 MG/ML IJ SOLN
INTRAMUSCULAR | Status: DC | PRN
  Administered 2024-07-21: .2 mg via INTRAVENOUS

## 2024-07-21 MED ORDER — MECLIZINE HCL 25 MG PO TABS: 25.0000 mg | ORAL_TABLET | Freq: Three times a day (TID) | ORAL | Status: DC | PRN

## 2024-07-21 MED ORDER — GABAPENTIN 300 MG PO CAPS
300.0000 mg | ORAL_CAPSULE | Freq: Two times a day (BID) | ORAL | Status: DC
  Administered 2024-07-21 – 2024-07-22 (×2): 300 mg via ORAL
  Filled 2024-07-21 (×2): qty 3

## 2024-07-21 MED ORDER — ROCURONIUM BROMIDE 10 MG/ML (PF) SYRINGE
PREFILLED_SYRINGE | INTRAVENOUS | Status: AC
  Filled 2024-07-21: qty 10

## 2024-07-21 MED ORDER — TRANEXAMIC ACID-NACL 1000-0.7 MG/100ML-% IV SOLN
1000.0000 mg | INTRAVENOUS | Status: AC
  Administered 2024-07-21: 1000 mg via INTRAVENOUS

## 2024-07-21 MED ORDER — FENTANYL CITRATE (PF) 100 MCG/2ML IJ SOLN
INTRAMUSCULAR | Status: AC
  Filled 2024-07-21: qty 2

## 2024-07-21 MED ORDER — LIDOCAINE HCL (PF) 1 % IJ SOLN
INTRAMUSCULAR | Status: AC
  Filled 2024-07-21: qty 5

## 2024-07-21 MED ORDER — SUCCINYLCHOLINE CHLORIDE 200 MG/10ML IV SOSY
PREFILLED_SYRINGE | INTRAVENOUS | Status: AC
  Filled 2024-07-21: qty 10

## 2024-07-21 MED ORDER — MUPIROCIN 2 % EX OINT
1.0000 | TOPICAL_OINTMENT | Freq: Two times a day (BID) | CUTANEOUS | 0 refills | Status: AC
  Filled 2024-07-21: qty 22, 11d supply, fill #0

## 2024-07-21 MED ORDER — PIOGLITAZONE HCL 30 MG PO TABS
30.0000 mg | ORAL_TABLET | Freq: Every day | ORAL | Status: DC
  Administered 2024-07-21 – 2024-07-22 (×2): 30 mg via ORAL
  Filled 2024-07-21 (×3): qty 1

## 2024-07-21 MED ORDER — EPHEDRINE 5 MG/ML INJ
INTRAVENOUS | Status: AC
  Filled 2024-07-21: qty 5

## 2024-07-21 MED ORDER — BISACODYL 10 MG RE SUPP: 10.0000 mg | Freq: Every day | RECTAL | Status: DC | PRN

## 2024-07-21 MED ORDER — FENTANYL CITRATE (PF) 100 MCG/2ML IJ SOLN: 25.0000 ug | INTRAMUSCULAR | Status: DC | PRN

## 2024-07-21 MED ORDER — KETOROLAC TROMETHAMINE 15 MG/ML IJ SOLN
15.0000 mg | Freq: Once | INTRAMUSCULAR | Status: AC
  Administered 2024-07-21: 15 mg via INTRAVENOUS

## 2024-07-21 MED ORDER — MAGNESIUM HYDROXIDE 400 MG/5ML PO SUSP: 30.0000 mL | Freq: Every day | ORAL | Status: DC | PRN

## 2024-07-21 MED ORDER — TIZANIDINE HCL 4 MG PO TABS: 4.0000 mg | ORAL_TABLET | Freq: Three times a day (TID) | ORAL | Status: DC | PRN

## 2024-07-21 MED ORDER — CEFAZOLIN SODIUM-DEXTROSE 2-4 GM/100ML-% IV SOLN
2.0000 g | INTRAVENOUS | Status: AC
  Administered 2024-07-21: 2 g via INTRAVENOUS

## 2024-07-21 MED ORDER — CHLORHEXIDINE GLUCONATE 4 % EX SOLN
1.0000 | CUTANEOUS | 1 refills | Status: DC
  Filled 2024-07-21: qty 472, 30d supply, fill #0

## 2024-07-21 MED ORDER — CARVEDILOL 3.125 MG PO TABS
3.1250 mg | ORAL_TABLET | Freq: Two times a day (BID) | ORAL | Status: DC
  Administered 2024-07-21: 3.125 mg via ORAL
  Filled 2024-07-21 (×2): qty 1

## 2024-07-21 MED ORDER — ENOXAPARIN SODIUM 40 MG/0.4ML IJ SOSY
40.0000 mg | PREFILLED_SYRINGE | INTRAMUSCULAR | Status: DC
  Administered 2024-07-22: 40 mg via SUBCUTANEOUS
  Filled 2024-07-21: qty 0.4

## 2024-07-21 MED ORDER — KETOROLAC TROMETHAMINE 15 MG/ML IJ SOLN
INTRAMUSCULAR | Status: AC
  Filled 2024-07-21: qty 1

## 2024-07-21 MED ORDER — BUPIVACAINE-EPINEPHRINE (PF) 0.5% -1:200000 IJ SOLN
INTRAMUSCULAR | Status: DC | PRN
  Administered 2024-07-21: 4 mL
  Administered 2024-07-21: 26 mL

## 2024-07-21 MED ORDER — CHLORHEXIDINE GLUCONATE 0.12 % MT SOLN
OROMUCOSAL | Status: AC
  Filled 2024-07-21: qty 15

## 2024-07-21 MED ORDER — FENTANYL CITRATE (PF) 100 MCG/2ML IJ SOLN
INTRAMUSCULAR | Status: DC | PRN
  Administered 2024-07-21: 50 ug via INTRAVENOUS

## 2024-07-21 MED ORDER — TRIAMCINOLONE ACETONIDE 40 MG/ML IJ SUSP
INTRAMUSCULAR | Status: AC
  Filled 2024-07-21: qty 1

## 2024-07-21 MED ORDER — HYDROCODONE-ACETAMINOPHEN 5-325 MG PO TABS
1.0000 | ORAL_TABLET | ORAL | Status: DC | PRN
  Administered 2024-07-22: 2 via ORAL
  Filled 2024-07-21: qty 2

## 2024-07-21 MED ORDER — PHENOL 1.4 % MT LIQD
1.0000 | OROMUCOSAL | Status: DC | PRN
  Filled 2024-07-21: qty 177

## 2024-07-21 MED ORDER — METOCLOPRAMIDE HCL 5 MG/ML IJ SOLN: 5.0000 mg | Freq: Three times a day (TID) | INTRAMUSCULAR | Status: DC | PRN

## 2024-07-21 MED ORDER — INSULIN ASPART 100 UNIT/ML IJ SOLN
INTRAMUSCULAR | Status: AC
Start: 2024-07-21 — End: 2024-07-21
  Filled 2024-07-21: qty 1

## 2024-07-21 MED ORDER — LIDOCAINE HCL (PF) 2 % IJ SOLN
INTRAMUSCULAR | Status: AC
  Filled 2024-07-21: qty 5

## 2024-07-21 MED ORDER — CHLORHEXIDINE GLUCONATE 0.12 % MT SOLN
15.0000 mL | Freq: Once | OROMUCOSAL | Status: AC
  Administered 2024-07-21: 15 mL via OROMUCOSAL

## 2024-07-21 MED ORDER — BUPIVACAINE LIPOSOME 1.3 % IJ SUSP
INTRAMUSCULAR | Status: DC | PRN
  Administered 2024-07-21: 20 mL via PERINEURAL

## 2024-07-21 MED ORDER — PANTOPRAZOLE SODIUM 40 MG PO TBEC
40.0000 mg | DELAYED_RELEASE_TABLET | Freq: Every day | ORAL | Status: DC
  Administered 2024-07-22: 40 mg via ORAL
  Filled 2024-07-21: qty 1

## 2024-07-21 MED ORDER — POVIDONE (PF) 0.5 % OP SOLN
1.0000 [drp] | Freq: Two times a day (BID) | OPHTHALMIC | Status: DC
  Filled 2024-07-21: qty 5

## 2024-07-21 MED ORDER — NITROGLYCERIN 0.4 MG SL SUBL: 0.4000 mg | SUBLINGUAL_TABLET | SUBLINGUAL | Status: DC | PRN

## 2024-07-21 MED ORDER — TRIAMCINOLONE ACETONIDE 40 MG/ML IJ SUSP
INTRAMUSCULAR | Status: DC | PRN
  Administered 2024-07-21: 40 mg

## 2024-07-21 MED ORDER — ORAL CARE MOUTH RINSE: 15.0000 mL | Freq: Once | OROMUCOSAL | Status: AC

## 2024-07-21 MED ORDER — ONDANSETRON HCL 4 MG/2ML IJ SOLN: 4.0000 mg | Freq: Four times a day (QID) | INTRAMUSCULAR | Status: DC | PRN

## 2024-07-21 MED ORDER — FUROSEMIDE 20 MG PO TABS
20.0000 mg | ORAL_TABLET | Freq: Every day | ORAL | Status: DC
  Administered 2024-07-21 – 2024-07-22 (×2): 20 mg via ORAL
  Filled 2024-07-21 (×2): qty 1

## 2024-07-21 MED ORDER — ASPIRIN 81 MG PO TBEC
81.0000 mg | DELAYED_RELEASE_TABLET | Freq: Every day | ORAL | Status: DC
Start: 2024-07-22 — End: 2024-07-22
  Administered 2024-07-22: 81 mg via ORAL
  Filled 2024-07-21: qty 1

## 2024-07-21 MED ORDER — ACETAMINOPHEN 10 MG/ML IV SOLN
INTRAVENOUS | Status: DC | PRN
  Administered 2024-07-21: 1000 mg via INTRAVENOUS

## 2024-07-21 MED ORDER — SODIUM CHLORIDE 0.9 % IR SOLN
Status: DC | PRN
Start: 2024-07-21 — End: 2024-07-21
  Administered 2024-07-21: 3000 mL

## 2024-07-21 MED ORDER — BRIMONIDINE TARTRATE 0.2 % OP SOLN
1.0000 [drp] | Freq: Two times a day (BID) | OPHTHALMIC | Status: DC
  Filled 2024-07-21: qty 5

## 2024-07-21 MED ORDER — DOCUSATE SODIUM 100 MG PO CAPS
100.0000 mg | ORAL_CAPSULE | Freq: Two times a day (BID) | ORAL | Status: DC
  Administered 2024-07-21 – 2024-07-22 (×3): 100 mg via ORAL
  Filled 2024-07-21 (×3): qty 1

## 2024-07-21 MED ORDER — DIPHENHYDRAMINE HCL 12.5 MG/5ML PO ELIX: 12.5000 mg | ORAL_SOLUTION | ORAL | Status: DC | PRN

## 2024-07-21 MED ORDER — KETOROLAC TROMETHAMINE 15 MG/ML IJ SOLN
7.5000 mg | Freq: Four times a day (QID) | INTRAMUSCULAR | Status: DC
  Administered 2024-07-21 – 2024-07-22 (×3): 7.5 mg via INTRAVENOUS
  Filled 2024-07-21 (×3): qty 1

## 2024-07-21 MED ORDER — LIDOCAINE HCL (CARDIAC) PF 100 MG/5ML IV SOSY
PREFILLED_SYRINGE | INTRAVENOUS | Status: DC | PRN
  Administered 2024-07-21: 80 mg via INTRAVENOUS

## 2024-07-21 MED ORDER — EPHEDRINE SULFATE (PRESSORS) 50 MG/ML IJ SOLN
INTRAMUSCULAR | Status: DC | PRN
  Administered 2024-07-21: 10 mg via INTRAVENOUS
  Administered 2024-07-21: 5 mg via INTRAVENOUS
  Administered 2024-07-21: 10 mg via INTRAVENOUS

## 2024-07-21 SURGICAL SUPPLY — 63 items
BASEPLATE BOSS DRILL (MISCELLANEOUS) IMPLANT
BIT DRILL F/BASEPLATE CENTRAL (BIT) IMPLANT
BIT DRILL FLUTED 3.0 STRL (BIT) IMPLANT
BLADE SAW SAG 25X90X1.19 (BLADE) ×2 IMPLANT
CHLORAPREP W/TINT 26 (MISCELLANEOUS) ×2 IMPLANT
COOLER POLAR GLACIER W/PUMP (MISCELLANEOUS) ×2 IMPLANT
CUP SUT UNIV REVERS 39 NEU (Shoulder) IMPLANT
DRAPE INCISE IOBAN 66X45 STRL (DRAPES) ×2 IMPLANT
DRAPE SHEET LG 3/4 BI-LAMINATE (DRAPES) ×2 IMPLANT
DRAPE TABLE BACK 80X90 (DRAPES) ×2 IMPLANT
DRSG OPSITE POSTOP 4X8 (GAUZE/BANDAGES/DRESSINGS) ×2 IMPLANT
ELECT CAUTERY BLADE 6.4 (BLADE) ×2 IMPLANT
ELECTRODE BLDE 4.0 EZ CLN MEGD (MISCELLANEOUS) ×2 IMPLANT
ELECTRODE REM PT RTRN 9FT ADLT (ELECTROSURGICAL) ×2 IMPLANT
GAUZE XEROFORM 1X8 LF (GAUZE/BANDAGES/DRESSINGS) ×2 IMPLANT
GLENOID UNI REV MOD 24 +2 LAT (Joint) IMPLANT
GLENOSPHERE 39+4 LAT/24 UNI RV (Joint) IMPLANT
GLOVE BIO SURGEON STRL SZ7.5 (GLOVE) ×8 IMPLANT
GLOVE BIO SURGEON STRL SZ8 (GLOVE) ×8 IMPLANT
GLOVE BIOGEL PI IND STRL 8 (GLOVE) ×4 IMPLANT
GLOVE INDICATOR 8.0 STRL GRN (GLOVE) ×2 IMPLANT
GOWN STRL REUS W/ TWL LRG LVL3 (GOWN DISPOSABLE) ×4 IMPLANT
GOWN STRL REUS W/ TWL XL LVL3 (GOWN DISPOSABLE) ×2 IMPLANT
GUIDE PIN 2.0 X 150 (WIRE) IMPLANT
HANDLE YANKAUER SUCT OPEN TIP (MISCELLANEOUS) ×2 IMPLANT
HOOD PEEL AWAY T7 (MISCELLANEOUS) ×6 IMPLANT
INSERT HUMERAL MED 39/ +3 (Shoulder) IMPLANT
KIT STABILIZATION SHOULDER (MISCELLANEOUS) ×2 IMPLANT
KIT TURNOVER KIT A (KITS) ×2 IMPLANT
MANIFOLD NEPTUNE II (INSTRUMENTS) ×2 IMPLANT
MASK FACE SPIDER DISP (MASK) ×2 IMPLANT
MAT ABSORB FLUID 56X50 GRAY (MISCELLANEOUS) ×2 IMPLANT
NDL MAYO CATGUT SZ1 (NEEDLE) IMPLANT
NDL SAFETY ECLIPSE 18X1.5 (NEEDLE) ×2 IMPLANT
NDL SPNL 20GX3.5 QUINCKE YW (NEEDLE) ×2 IMPLANT
NEEDLE MAYO CATGUT SZ1 (NEEDLE) IMPLANT
NEEDLE SPNL 20GX3.5 QUINCKE YW (NEEDLE) ×2 IMPLANT
PACK ARTHROSCOPY SHOULDER (MISCELLANEOUS) ×2 IMPLANT
PAD ARMBOARD POSITIONER FOAM (MISCELLANEOUS) ×2 IMPLANT
PAD WRAPON POLAR SHDR UNIV (MISCELLANEOUS) ×2 IMPLANT
PENCIL SMOKE EVACUATOR (MISCELLANEOUS) ×2 IMPLANT
PIN NITINOL TARGETER 2.8 (PIN) IMPLANT
SCREW CENTRAL MOD 30MM (Screw) IMPLANT
SCREW PERI LOCK 5.5X16 (Screw) IMPLANT
SCREW PERI LOCK 5.5X36 (Screw) IMPLANT
SCREW PERIPHERAL 5.5X20 LOCK (Screw) IMPLANT
SCREW PERIPHERAL 5.5X40 LOCK (Screw) IMPLANT
SLING ULTRA II LG (MISCELLANEOUS) IMPLANT
SLING ULTRA II M (MISCELLANEOUS) IMPLANT
SOL .9 NS 3000ML IRR UROMATIC (IV SOLUTION) ×2 IMPLANT
SPONGE T-LAP 18X18 ~~LOC~~+RFID (SPONGE) ×4 IMPLANT
STAPLER SKIN PROX 35W (STAPLE) ×2 IMPLANT
STEM HUM UNIV REV 9 (Stem) IMPLANT
SUT ETHIBOND 0 MO6 C/R (SUTURE) ×2 IMPLANT
SUT VIC AB 0 CT1 36 (SUTURE) ×2 IMPLANT
SUT VIC AB 2-0 CT1 TAPERPNT 27 (SUTURE) ×4 IMPLANT
SUTURE FIBERWR #2 38 BLUE 1/2 (SUTURE) ×8 IMPLANT
SYR 10ML LL (SYRINGE) ×2 IMPLANT
SYR 30ML LL (SYRINGE) ×2 IMPLANT
SYR TOOMEY 50ML (SYRINGE) ×2 IMPLANT
TIP FAN IRRIG PULSAVAC PLUS (DISPOSABLE) ×2 IMPLANT
TRAP FLUID SMOKE EVACUATOR (MISCELLANEOUS) ×2 IMPLANT
WATER STERILE IRR 500ML POUR (IV SOLUTION) ×2 IMPLANT

## 2024-07-21 NOTE — Op Note (Signed)
 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  Surgeon:   DOROTHA Reyes Maltos, MD  Assistant:   Gustavo Level, PA-C  Anesthesia:   General endotracheal with an interscalene block using Exparel  placed preoperatively by the anesthesiologist.  Findings:   As above.  Complications:   None  EBL:   100 cc  Fluids:   600 cc crystalloid  UOP:   None  TT:   None  Drains:   None  Closure:   Staples  Implants:   All press-fit Arthrex system with a #9 Univers Revers humeral stem, a 39 mm SutureCup with a +3 mm insert, and a 24 mm base plate with a 39 mm +4 mm lateralized glenosphere.  Brief Clinical Note:   The patient is a 68 year old male who is now nearly 8 months status post a right shoulder arthroscopy with debridement, decompression, rotator cuff repair, and biceps tenodesis. Initially, the patient was doing better, but over the past few months, his symptoms of pain, stiffness, and weakness have worsened. His history and examination were consistent with adhesive capsulitis with a possible recurrent rotator cuff tear. Subsequent MRI scanning confirmed the presence of avascular necrosis of his humeral head. The patient presents at this time for a reverse right total shoulder arthroplasty.  Procedure:   The patient underwent placement of an interscalene block using Exparel  by the anesthesiologist in the preoperative holding area before being brought into the operating room and lain in the supine position. The patient then underwent general endotracheal intubation and anesthesia before the patient was repositioned in the beach chair position using the beach chair positioner. The right shoulder and upper extremity were prepped with ChloraPrep solution before being draped sterilely.  Preoperative antibiotics were administered. A timeout was performed to verify the appropriate surgical site.    A standard anterior approach to the shoulder was made through an approximately 4-5 inch incision. The incision was carried down through the subcutaneous tissues to expose the deltopectoral fascia. The interval between the deltoid and pectoralis muscles was identified and this plane developed, retracting the cephalic vein laterally with the deltoid muscle. The conjoined tendon was identified. Its lateral margin was dissected and the Kolbel self-retraining retractor inserted. The three sisters were identified and cauterized. Bursal tissues were removed to improve visualization.   The biceps tendon was identified near the inferior aspect of the bicipital groove. A soft tissue tenodesis was performed by attaching the biceps tendon to the adjacent pectoralis major tendon using two #0 Ethibond interrupted sutures. The biceps tendon was then transected just proximal to the tenodesis site. The subscapularis tendon was released from its attachment to the lesser tuberosity 1 cm proximal to its insertion and several tagging sutures placed. The inferior capsule was released with care after identifying and protecting the axillary nerve. The proximal humeral cut was made at approximately 30 of retroversion using the extra-medullary guide.   Attention was redirected to the glenoid. The labrum was debrided circumferentially before the center of the glenoid was marked with electrocautery. The guidewire was drilled into the glenoid neck using the appropriate guide. After verifying its position, it was overreamed with the baseplate reamer to create a flat surface before the peripheral reamer was introduced to clean off any peripheral soft tissues. The central 10 mm coring reamer  was then inserted before the hole was tapped to complete the glenoid preparation.   The permanent mini-baseplate construct with a 30 mm  screw attachment was screwed into place and tightened securely. The baseplate was then further secured using four peripheral screws. Locking screws were placed superiorly and inferiorly while one non-locking screw was placed posteriorly. The permanent 39 mm +4 mm lateralized glenosphere was then impacted into place and its Morse taper locking mechanism verified using manual distraction. Finally, the glenosphere locking screw was inserted and tightened securely.  Attention was directed to the humeral side. The humeral canal was reamed with first the 5 mm and then the 6 mm reamer. The canal was broached beginning with a #5 broach and progressing to a #9 broach. This was left in place and the metaphyseal inset reamer was used to create the metaphyseal socket.  A trial reduction performed using the 39 mm trial humeral platform with the +3 mm insert. The arm demonstrated excellent range of motion as the hand could be brought across the chest to the opposite shoulder and brought to the top of the patient's head and to the patient's ear. The shoulder appeared stable throughout this range of motion. The joint was dislocated and the trial components removed.   The permanent #9 Univers Revers stem was impacted into place with care taken to maintain the appropriate version. The permanent 39 mm SutureCup humeral platform was then impacted into place before the +3 mm insert was snapped into place. The shoulder was relocated using two finger pressure and again placed through a range of motion with the findings as described above.  The wound was copiously irrigated with sterile saline solution using the jet lavage system before a total of 30 cc of 0.5% Sensorcaine  with epinephrine  was injected into the pericapsular and peri-incisional tissues to help with postoperative analgesia. The subscapularis tendon was reapproximated using #2 FiberWire interrupted sutures. The deltopectoral interval was closed using #0 Vicryl  interrupted sutures before the subcutaneous tissues were closed using 2-0 Vicryl interrupted sutures. The skin was closed using staples. A sterile occlusive dressing was applied to the wound before the arm was placed into a shoulder immobilizer with an abduction pillow. A Polar Care system also was applied to the shoulder. The patient was then transferred back to a hospital bed before being awakened, extubated, and returned to the recovery room in satisfactory condition after tolerating the procedure well.

## 2024-07-21 NOTE — H&P (Signed)
 History of Present Illness: Robert Fernandez is a 68 y.o. male who presents today for his surgical history and physical for upcoming right reverse total shoulder arthroplasty. Surgery is scheduled with Dr. Edie on 07/21/2024. The patient denies any recent trauma or injury affecting the right shoulder. The patient reports a 7 out of 10 pain score in the right shoulder at today's visit. The patient does have a history of CABG, in addition to diabetes, history of DVT and asthma. The patient is also reporting ongoing right elbow pain at today's visit. He denies any recent trauma or injury for the right elbow. He reports an aching and throbbing pain which is worse with attempting to perform increased range of motion exercises in addition to grip and lift objects. He denies any previous surgical history of right elbow. He does report a burning tingling in the right arm however this is chronic, the patient does have a history of undergoing cervical spine steroid injections in the past.  Past Medical History: Asthma, unspecified asthma severity, unspecified whether complicated, unspecified whether persistent (HHS-HCC)  Cirrhosis (CMS/HHS-HCC)  Cirrhosis of liver (CMS/HHS-HCC) 07/20/2022  Coronary artery disease  Diabetes mellitus without complication (CMS/HHS-HCC)  states AM BGs 80-120 as of 03/14/23  Drug-induced anaphylaxis  DVT (deep venous thrombosis) (CMS/HHS-HCC)  GERD (gastroesophageal reflux disease)  Heart disease  History of DVT (deep vein thrombosis) 2017  Left leg following after CABG surgery  History of staph infection 2017  after CABG- unable to verify if MRSA, was on IV abx  HLD (hyperlipidemia)  Hypertension  Obstructive sleep apnea (adult) (pediatric) 03/03/2022  no CPAP presently (02/2023) unable to tolerate due to eye condition  Osteoarthritis  Osteoporosis  PONV (postoperative nausea and vomiting)  More recently, not an issue  Poor intravenous access  Posterior tibial tendon  dysfunction  Rheumatoid arthritis (CMS/HHS-HCC)  Sleep apnea  Splenomegaly  Type 2 diabetes mellitus (CMS/HHS-HCC)  Wears glasses   Past Surgical History: CARDIAC SURGERY 2017  CABG  CORONARY ARTERY BYPASS GRAFT 09/2016  LIMA to 99% LM, SVG to LCX at [place], [place]  LENS EYE SURGERY Bilateral 2019  CE AND PCIOL OU  COLONOSCOPY W/BIOPSY N/A 01/19/2021  Procedure: Colonoscopy; Surgeon: Nola Tobias Brunt, MD; Location: [facility]; Service: Gastroenterology; Laterality: N/A;  ESOPHAGOGASTRODOUDENOSCOPY W/BIOPSY N/A 01/19/2021  Procedure: EGD; Surgeon: Nola Tobias Brunt, MD; Location: [facility]; Service: Gastroenterology; Laterality: N/A;  INSERTION AQUEOUS SHUNT Right 10/08/2022  Procedure: AQUEOUS SHUNT TO EXTRAOCULAR EQUATORIAL PLATE RESERVOIR, EXTERNAL APPROACH; WITH GRAFT; Surgeon: Arminda Rockie Section, MD; [facility]; Service: Ophthalmology; Laterality: Right;  BLEPHAROPLASTY UPPER EYELID Bilateral 03/18/2023  Procedure: Bilateral upper eyelid blepharoplasty with bilateral blepharoptosis repair; Surgeon: Barrington Selinda Righter, MD; Location: [facility]; Service: Ophthalmology; Laterality: Bilateral;  INSERTION AQUEOUS SHUNT Left 07/22/2023  Procedure: AQUEOUS SHUNT TO EXTRAOCULAR EQUATORIAL PLATE RESERVOIR, EXTERNAL APPROACH; WITH GRAFT -- AHMED; Surgeon: Arminda Rockie Section, MD; [facility]; Service: Ophthalmology; Laterality: Left;  shoulder surgery 12/17/2023  eye surgery stunt put in  ACHILLES TENDON REPAIR Bilateral  BACK SURGERY  CHOLECYSTECTOMY  CORONARY ANGIOPLASTY  EXPLORATORY LAPAROTOMY  fracture L tibia  FRACTURE SURGERY  HEMORRHOIDECTOMY EXTERNAL  history of rotator cuff repair Left  KNEE ARTHROSCOPY  LAPAROSCOPIC CHOLECYSTECTOMY  lumbar diskectomy   Past Family History: Cataracts Mother  Cataracts Father  Cancer Father  Heart disease Father  High blood pressure (Hypertension) Father  Hyperlipidemia (Elevated  cholesterol) Father  Lung cancer Father  Prostate cancer Father  Diabetes Sister  Diabetes type II Sister  High blood pressure (Hypertension) Sister  Kidney disease Sister  Transplant x2  Osteoarthritis Sister  Diabetes type II Sister  High blood pressure (Hypertension) Sister  No Known Problems Brother  Diabetes Son  Diabetes Maternal Grandmother  Cataracts Maternal Grandmother  Diabetes type II Maternal Grandmother  Anesthesia problems Neg Hx  Malignant hypertension Neg Hx   Medications: acetaminophen  (TYLENOL ) 500 MG tablet Take 500 mg by mouth every 6 (six) hours as needed for Pain  albuterol  MDI, PROVENTIL , VENTOLIN , PROAIR , HFA 90 mcg/actuation inhaler Inhale 2 inhalations into the lungs every 4 (four) hours as needed 6.7 g 0  aspirin  81 MG EC tablet Take 81 mg by mouth once daily - hx CABG  blood glucose diagnostic test strip Use once daily 100 each 3  brimonidine  (ALPHAGAN ) 0.2 % ophthalmic solution Place 1 drop into both eyes 2 (two) times daily 30 mL 4  carvediloL  (COREG ) 6.25 MG tablet Take 1 tablet (6.25 mg total) by mouth 2 (two) times daily with meals 180 tablet 3  diazePAM  (VALIUM ) 5 MG tablet Take 1 tablet 30 minutes prior to MRI scan and one tablet at time of MRI if needed for anxiety 2 tablet 0  empagliflozin  (JARDIANCE ) 25 mg tablet Take 1 tablet (25 mg total) by mouth daily with breakfast 90 tablet 3  flash glucose scanning (FREESTYLE LIBRE 14 DAY) reader Use as directed 1 each 0  flash glucose sensor (FREESTYLE LIBRE 14 DAY SENSOR) kit for glucose monitoring 7 kit 3  FUROsemide  (LASIX ) 20 MG tablet Take 1 tablet (20 mg total) by mouth daily. 100 tablet 3  gabapentin  (NEURONTIN ) 300 MG capsule Take 1 capsule twice a day and 3 capsules at bedtime 450 capsule 3  ibuprofen (MOTRIN) 600 MG tablet 1 tablet with food or milk as needed Orally Three times a day for 10 days  meclizine  (ANTIVERT ) 25 mg tablet Take 1 tablet (25 mg total) by mouth 3 (three) times daily as  needed for Dizziness 30 tablet 1  methocarbamoL  (ROBAXIN ) 750 MG tablet Take 1 tablet (750 mg total) by mouth 2 (two) times daily as needed 60 tablet 11  omeprazole  (PRILOSEC) 40 MG DR capsule Take 1 capsule (40 mg total) by mouth once daily  pen needle, diabetic (BD ULTRA-FINE MICRO PEN NEEDLE) 32 gauge x 1/4 needle Use as directed 100 each 12  pioglitazone  (ACTOS ) 30 MG tablet Take 1 tablet (30 mg total) by mouth once daily 90 tablet 3  REPATHA  SURECLICK 140 mg/mL PnIj INJECT 140MG  UNDER THE SKIN EVERY 14 DAYS 2 mL 0  semaglutide  (OZEMPIC ) 2 mg/dose (8 mg/3 mL) pen injector Inject 0.75 mLs (2 mg total) subcutaneously once a week 9 mL 3  SHARPS CONTAINER  spironolactone  (ALDACTONE ) 50 MG tablet Take 1 tablet (50 mg total) by mouth daily. 90 tablet 1  SUMAtriptan  (IMITREX ) 50 MG tablet Take 1 tablet (50 mg total) by mouth as directed for Migraine May take a second dose after 2 hours if needed. 10 tablet 2  traMADoL  (ULTRAM ) 50 mg tablet Take 1 tablet (50 mg total) by mouth every 6 (six) hours as needed for Pain 30 tablet 0  verapamiL  (VERELAN ) 360 MG SR capsule Take 1 capsule (360 mg total) by mouth at bedtime. 90 capsule 0   Allergies: Amoxicillin-Pot Clavulanate Anaphylaxis, Palpitations, Shortness Of Breath, Syncope and Other (BP drops; Pt. States, my BP tanked) Codeine (Dizziness and Hallucinations, COMBATIVE - Pt. States, It makes me act crazy) Crestor [Rosuvastatin] Other (Muscle Pain, cramps) Hydromorphone (Dizziness and Hallucinations)  Lipitor [Atorvastatin] Other (Muscle Pain, cramps)  Oxycodone -Acetaminophen  (Nausea And Vomiting)   Review of Systems:  A comprehensive 14 point ROS was performed, reviewed by me today, and the pertinent orthopaedic findings are documented in the HPI.  Physical Exam: BP 130/86  Ht 182.9 cm (6')  Wt 95.3 kg (210 lb)  BMI 28.48 kg/m  General/Constitutional: The patient appears to be well-nourished, well-developed, and in no acute  distress. Neuro/Psych: Normal mood and affect, oriented to person, place and time. Eyes: Non-icteric. Pupils are equal, round, and reactive to light, and exhibit synchronous movement. ENT: Unremarkable. Lymphatic: No palpable adenopathy. Respiratory: Lungs clear to auscultation, Normal chest excursion, No wheezes, and Non-labored breathing Cardiovascular: Regular rate and rhythm. No murmurs. and No edema, swelling or tenderness, except as noted in detailed exam. Integumentary: No impressive skin lesions present, except as noted in detailed exam. Musculoskeletal: Unremarkable, except as noted in detailed exam.  Right shoulder exam: On inspection, his surgical incision and arthroscopic portal sites are well-healed without evidence for infection. There is perhaps minimal residual swelling around the shoulder, no erythema or ecchymosis is identified. He scribes mild tenderness to palpation over the anterolateral aspect of the shoulder. Actively, he is able to forward flex to 75 degrees, abduct to 60 degrees, and internally rotate to the posterior aspect of his iliac crest. Passively, he is able to tolerate forward flexion to 100 degrees and abduction to 80 degrees. With abduction to 80 degrees, he can tolerate external rotation to 70 degrees and internal rotation to 45 degrees.SABRA He exhibits 3-4/5 strength with gentle resisted internal and external rotation, as well as with gentle resisted abduction with his arm at his side. He notes mild pain with resisted strength testing in all directions. He remains grossly neurovascularly intact to the right upper extremity and hand. The patient does have mild tenderness with palpation over the lateral, medial and posterior aspect of the right elbow. The distal biceps tendon can be palpated. He is able to flex close to 120 degrees, extension -5 degrees. He has intact supination pronation without significant discomfort today's visit.  Imaging: MRI OF THE RIGHT SHOULDER  WITHOUT CONTRAST:  1. Status post anterior supraspinatus rotator cuff repair. The  anterior supraspinatus tendon footprint tear is markedly improved.  There may be a thin horizontal linear 4 mm partial-thickness  residual articular sided tear at the mid to posterior supraspinatus  tendon, versus artifact. This is small and of likely low clinical  significance. The prior anterior high-grade partial-thickness and  full-thickness tear appears resolved/intact.  2. Mild-to-moderate supraspinatus muscle atrophy is unchanged.  3. Interval acromioplasty, now with type II acromion and resolution  of the prior anterolateral downsloping of the acromion.  4. The proximal long head of the biceps tendon is no longer well  visualized proximal to the bicipital groove. Question interval  biceps tenotomy versus tenodesis.  5. New avascular necrosis within the superior, mid transverse, mid  to anterior humeral head in a region measuring up to approximately  2.4 x 1.8 x 0.5 cm. No overlying cortical collapse.   Impression: 1. Osteonecrosis of right head of humerus. 2. Adhesive capsulitis of right shoulder. 3. Rotator cuff tendinitis, right.  Plan:  1. Treatment options were discussed today with the patient. 2. The patient is scheduled for a right reverse total shoulder arthroplasty with Dr. Edie on 07/21/2024. 3. The patient was instructed on the risk and benefits of surgical intervention and wishes to proceed at this time. This document will serve as a  surgical history and physical for the patient. 4. Patient is experiencing ongoing right elbow pain. I did speak with the patient that this pain could be stemming from his underlying right shoulder osteonecrosis versus his underlying right elbow osteoarthritic changes. The patient would like to proceed with a intra-articular right elbow steroid injection which will be performed at time of surgery. 5. The patient will follow-up per standard postop protocol.  They can call the clinic they have any questions, new symptoms develop or symptoms worsen.  The procedure was discussed with the patient, as were the potential risks (including bleeding, infection, nerve and/or blood vessel injury, persistent or recurrent pain, axillary nerve injury, dislocation, stress fracture, need for further surgery, blood clots, strokes, heart attacks and/or arhythmias, pneumonia, etc.) and benefits. The patient states his understanding and wishes to proceed.    H&P reviewed and patient re-examined. No changes.

## 2024-07-21 NOTE — Anesthesia Preprocedure Evaluation (Signed)
 Anesthesia Evaluation  Patient identified by MRN, date of birth, ID band Patient awake    Reviewed: Allergy & Precautions, NPO status , Patient's Chart, lab work & pertinent test results  History of Anesthesia Complications (+) PONV and history of anesthetic complications  Airway Mallampati: III  TM Distance: >3 FB Neck ROM: full    Dental  (+) Chipped, Poor Dentition, Missing   Pulmonary shortness of breath and with exertion, asthma , sleep apnea , former smoker   Pulmonary exam normal        Cardiovascular Exercise Tolerance: Good hypertension, (-) angina + CAD  Normal cardiovascular exam     Neuro/Psych  Headaches TIA Neuromuscular disease  negative psych ROS   GI/Hepatic Neg liver ROS, hiatal hernia,GERD  Controlled,,  Endo/Other  diabetes, Type 2    Renal/GU      Musculoskeletal   Abdominal   Peds  Hematology negative hematology ROS (+)   Anesthesia Other Findings Patient has cardiac clearance for this procedure.  Past Medical History: No date: Aortic atherosclerosis (HCC) No date: Arthritis No date: Asthma 05/2016: Bacteremia due to methicillin susceptible Staphylococcus  aureus (MSSA)     Comment:  a.) s/p CABG 05/20/2024: Bilateral occipital neuralgia No date: Cervical spondylosis with radiculopathy No date: Cirrhosis of liver (HCC) No date: Coronary artery disease No date: DDD (degenerative disc disease), lumbar No date: Diabetic retinopathy of both eyes (HCC) No date: Diastolic dysfunction 09/29/2020: Dysfunction of Eustachian tube, bilateral 08/13/2019: Erectile dysfunction No date: GERD (gastroesophageal reflux disease) No date: Hepatosplenomegaly No date: Hiatal hernia No date: History of deep venous thrombosis No date: Hyperlipidemia No date: Hypertension No date: Left adrenal mass (HCC) No date: Long-term use of aspirin  therapy No date: Meningioma (HCC) 05/2016: Methicillin resistant  Staphylococcus epidermidis infection     Comment:  a.) sternal wound infection s/p CABG No date: Migraines No date: Neovascular glaucoma, indeterminate stage, left No date: Neuropathy 09/29/2020: Nocturnal leg cramps No date: Nontraumatic complete tear of right rotator cuff No date: Obstructive sleep apnea     Comment:  a.) unable to tolerate nocturnal PAP therapy d/t eye               condition No date: Osteoarthritis No date: Osteonecrosis of right head of humerus (HCC) No date: Osteopenia 09/06/2019: Plantar fasciitis No date: PONV (postoperative nausea and vomiting) No date: PTTD (posterior tibial tendon dysfunction) 05/25/2016: S/P CABG x 2     Comment:  a.) LIMA-LAD, SVG-LCx --> procedure complicated by MSSA               bacteremia and a signifcant MRSE sternal wound infection 07/15/2020: SOBOE (shortness of breath on exertion) No date: 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  Past Surgical History: No date: ACHILLES TENDON REPAIR No date: BLEPHAROPLASTY UPPER EYELID  05/25/2016: CORONARY ARTERY BYPASS GRAFT; N/A No date: FINGER AMPUTATION; Left No date: INSERTION AQUEOUS SHUNT  No date: KNEE ARTHROSCOPY; Left No date: LAPAROSCOPIC CHOLECYSTECTOMY 05/24/2016: LEFT HEART CATH AND CORONARY ANGIOGRAPHY; Left No date: LENS EYE SURGERY  No date: LUMBAR DISC SURGERY     Comment:  l4-5 No date: ROTATOR CUFF REPAIR; Left 12/17/2023: SHOULDER ARTHROSCOPY WITH SUBACROMIAL DECOMPRESSION,  ROTATOR CUFF REPAIR AND BICEP TENDON REPAIR; Right     Comment:  Procedure: RIGHT SHOULDER ARTHROSCOPY WITH DEBRIDEMENT,  DECOMPRESSION, ROTATOR CUFF REPAIR, BICEPS TENODESIS,               MANIPULATION UNDER ANESTHESIA.;  Surgeon: Edie Norleen PARAS,               MD;  Location: [facility] ORS;  Service: Orthopedics;                 Laterality: Right; No date: TIBIA FRACTURE SURGERY  BMI    Body Mass Index: 27.12 kg/m      Reproductive/Obstetrics negative OB ROS                              Anesthesia Physical Anesthesia Plan  ASA: 3  Anesthesia Plan: General ETT   Post-op Pain Management: Regional block*   Induction: Intravenous  PONV Risk Score and Plan: Ondansetron , Dexamethasone , Midazolam  and Treatment may vary due to age or medical condition  Airway Management Planned: Oral ETT  Additional Equipment:   Intra-op Plan:   Post-operative Plan: Extubation in OR  Informed Consent: I have reviewed the patients History and Physical, chart, labs and discussed the procedure including the risks, benefits and alternatives for the proposed anesthesia with the patient or authorized representative who has indicated his/her understanding and acceptance.     Dental Advisory Given  Plan Discussed with: Anesthesiologist, CRNA and Surgeon  Anesthesia Plan Comments: (Patient consented for risks of anesthesia including but not limited to:  - adverse reactions to medications - damage to eyes, teeth, lips or other oral mucosa - nerve damage due to positioning  - sore throat or hoarseness - Damage to heart, brain, nerves, lungs, other parts of body or loss of life  Patient voiced understanding and assent.)        Anesthesia Quick Evaluation

## 2024-07-21 NOTE — Transfer of Care (Signed)
 Immediate Anesthesia Transfer of Care Note  Patient: Robert Fernandez  Procedure(s) Performed: ARTHROPLASTY, SHOULDER, TOTAL, REVERSE (Right: Shoulder) STEROID INJECTION ELBOW (Right)  Patient Location: PACU  Anesthesia Type:General  Level of Consciousness: drowsy  Airway & Oxygen Therapy: Patient Spontanous Breathing and Patient connected to face mask oxygen  Post-op Assessment: Report given to RN and Post -op Vital signs reviewed and stable  Post vital signs: Reviewed and stable  Last Vitals:  Vitals Value Taken Time  BP 122/60 07/21/24 12:45  Temp    Pulse 66 07/21/24 12:47  Resp 16 07/21/24 12:47  SpO2 100 % 07/21/24 12:47  Vitals shown include unfiled device data.  Last Pain:  Vitals:   07/21/24 0833  TempSrc: Temporal  PainSc: 8          Complications: No notable events documented.

## 2024-07-21 NOTE — Inpatient Diabetes Management (Signed)
 Inpatient Diabetes Program Recommendations  AACE/ADA: New Consensus Statement on Inpatient Glycemic Control (2025)  Target Ranges:  Prepandial:   less than 140 mg/dL      Peak postprandial:   less than 180 mg/dL (1-2 hours)      Critically ill patients:  140 - 180 mg/dL   Lab Results  Component Value Date   GLUCAP 184 (H) 07/21/2024   HGBA1C 9.4 (H) 07/13/2024    Review of Glycemic Control  Latest Reference Range & Units 07/21/24 08:22 07/21/24 12:46  Glucose-Capillary 70 - 99 mg/dL 774 (H) 815 (H)   Diabetes history: DM  Outpatient Diabetes medications:  Ozemipic 2 mg daily Actos  30 mg daily Humalog  3 units tid with meals Tresiba  20 units daily Jardiance  25 mg daily Current orders for Inpatient glycemic control:  None- patient in surgery  Inpatient Diabetes Program Recommendations:    While in the hospital, consider continuation of basal insulin - Semglee 20 units daily and Novolog  moderate correction tid with meals and HS.   Thanks,  Randall Bullocks, RN, BC-ADM Inpatient Diabetes Coordinator Pager [phone]  (8a-5p)

## 2024-07-21 NOTE — Anesthesia Procedure Notes (Signed)
 Anesthesia Regional Block: Interscalene brachial plexus block   Pre-Anesthetic Checklist: , timeout performed,  Correct Patient, Correct Site, Correct Laterality,  Correct Procedure, Correct Position, site marked,  Risks and benefits discussed,  Surgical consent,  Pre-op evaluation,  At surgeon's request and post-op pain management  Laterality: Upper and Right  Prep: chloraprep       Needles:  Injection technique: Single-shot  Needle Type: Stimiplex     Needle Length: 9cm  Needle Gauge: 22     Additional Needles:   Procedures:,,,, ultrasound used (permanent image in chart),,    Narrative:  Start time: 07/21/2024 9:02 AM End time: 07/21/2024 9:04 AM Injection made incrementally with aspirations every 5 mL.  Performed by: Personally   Additional Notes: Patient consented for risk and benefits of nerve block including but not limited to nerve damage, Horner's syndrome, failed block, bleeding and infection.  Patient voiced understanding.  Functioning IV was confirmed and monitors were applied.  Timeout done prior to procedure and prior to any sedation being given to the patient.  Patient confirmed procedure site prior to any sedation given to the patient.  A 50mm 22ga Stimuplex needle was used. Sterile prep,hand hygiene and sterile gloves were used.  Minimal sedation used for procedure.  No paresthesia endorsed by patient during the procedure.  Negative aspiration and negative test dose prior to incremental administration of local anesthetic. The patient tolerated the procedure well with no immediate complications.

## 2024-07-21 NOTE — Progress Notes (Signed)
 OT Cancellation Note  Patient Details Name: Robert Fernandez MRN: [id number] DOB: 10/22/1956   Cancelled Treatment:    Reason Eval/Treat Not Completed: Other (comment). OT eval attempted upon pt arriving to [facility]. He reports he does not want to do any therapy at this time to come back later. Pt reports he will be staying overnight d/t his preference. TSA handout left in room for therapist to educate on tomorrow during evaluation. Will re-attempt tomorrow.  [name] E Floretta Petro 07/21/2024, 4:46 PM

## 2024-07-21 NOTE — Discharge Summary (Signed)
 Physician Discharge Summary  Patient ID: MCCOY TESTA MRN: [id number] DOB/AGE: [id number] y.o.  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]   Discharge Diagnoses: Patient Active Problem List   Diagnosis Date Noted   Status post reverse arthroplasty of shoulder, right 07/21/2024   Bilateral occipital neuralgia 05/20/2024   Cervical spondylosis 04/29/2024   Localized osteoarthritis of right shoulder 11/14/2023   Chronic right shoulder pain 11/14/2023   Neovascular glaucoma, indeterminate stage, 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   Preoperative evaluation of a medical condition to rule out surgical contraindications (TAR required) 07/19/2023   Raised intraocular pressure of right eye 09/12/2022   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, without long-term current use of insulin  (HCC) 09/12/2022   Myofascial pain 08/16/2022   Cirrhosis of liver (HCC) 07/20/2022   Pain of left calf 06/29/2022   Pain of left lower leg 06/29/2022   Strain of calf muscle 06/29/2022   Obstructive sleep apnea 03/03/2022   Cervical radicular pain 10/26/2020   Neck pain 10/26/2020   Chronic diarrhea 09/29/2020   Dysfunction of Eustachian tube, bilateral 09/29/2020   Nocturnal leg cramps 09/29/2020   Unintended weight loss 09/29/2020   SOBOE (shortness of breath on exertion) 07/15/2020   Hepatosplenomegaly 06/28/2020   Left adrenal mass (HCC) 06/28/2020   Diabetic retinopathy of both eyes (HCC) 06/01/2020   History of deep venous thrombosis 09/06/2019    Migraines 09/06/2019   Plantar fasciitis 09/06/2019   Asthma 08/13/2019   Erectile dysfunction 08/13/2019   Meningioma (HCC) 11/26/2018   Ankle fracture 11/06/2018   Chest pain 11/06/2018   Coronary artery disease involving native heart without angina pectoris 11/06/2018   Facial droop 11/06/2018   HLD (hyperlipidemia) 11/06/2018   HTN (hypertension) 11/06/2018   Hx of CABG 11/06/2018   Osteopenia 11/06/2018   Slurred speech 11/06/2018   Overweight (BMI 25.0-29.9) 10/02/2018   Osteoarthritis 07/15/2015    Past Medical History:  Diagnosis Date   Aortic atherosclerosis (HCC)    Arthritis    Asthma    Bacteremia due to methicillin susceptible Staphylococcus aureus (MSSA) 05/2016   a.) s/p CABG   Bilateral occipital neuralgia 05/20/2024   Cervical spondylosis with radiculopathy    Cirrhosis of liver (HCC)    Coronary artery disease    DDD (degenerative disc disease), lumbar    Diabetic retinopathy of both eyes (HCC)    Diastolic dysfunction    Dysfunction of Eustachian tube, bilateral 09/29/2020   Erectile dysfunction 08/13/2019   GERD (gastroesophageal reflux disease)    Hepatosplenomegaly    Hiatal hernia    History of deep venous thrombosis    Hyperlipidemia    Hypertension    Left adrenal mass (HCC)    Long-term use of aspirin  therapy    Meningioma (HCC)    Methicillin resistant Staphylococcus epidermidis infection 05/2016   a.) sternal wound infection s/p CABG   Migraines    Neovascular glaucoma, indeterminate stage, left    Neuropathy    Nocturnal leg cramps 09/29/2020   Nontraumatic complete tear of right rotator cuff    Obstructive sleep apnea    a.) unable to tolerate nocturnal PAP therapy d/t  eye condition   Osteoarthritis    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     Transfusion: None.   Consultants (if any):   Discharged Condition: Improved  Hospital Course: ANGELINO RUMERY is an 68 y.o. male who was admitted 07/21/2024 with a diagnosis of avascular necrosis with rotator cuff tendinopathy s/p prior rotator cuff repair and went to the operating room on 07/21/2024 and underwent the above named procedures.    Surgeries: Procedure(s): ARTHROPLASTY, SHOULDER, TOTAL, REVERSE STEROID INJECTION ELBOW on 07/21/2024 Patient tolerated the surgery well. Taken to PACU where she was stabilized and then transferred to the orthopedic floor.  Started on Lovenox  40mg  q 24 hrs. Heels elevated on bed with rolled towels. No evidence of DVT. Negative Homan. Physical therapy started on day #1 for gait training and transfer. OT started day #1 for ADL and assisted devices.  Patient's IV was removed on POD1.  Implants: All press-fit Arthrex system with a #9 [employer] humeral stem, a 39 mm SutureCup with a +3 mm insert, and a 24 mm base plate with a 39 mm +4 mm lateralized glenosphere.   He was given perioperative antibiotics:  Anti-infectives (From admission, onward)    Start     Dose/Rate Route Frequency Ordered Stop   07/21/24 1700  ceFAZolin  (ANCEF ) IVPB 2g/100 mL premix        2 g 200 mL/hr over 30 Minutes Intravenous Every 6 hours 07/21/24 1514 07/21/24 2354   07/21/24 0600  ceFAZolin  (ANCEF ) IVPB 2g/100 mL premix        2 g 200 mL/hr over 30 Minutes Intravenous On call to O.R. 07/21/24 0055 07/21/24 1041     .  He was given sequential compression devices, early ambulation, and Lovenox  for DVT prophylaxis.  He  benefited maximally from the hospital stay and there were no complications.    Recent vital signs:  Vitals:   07/21/24 2207 07/22/24 0746  BP: (!) 99/56 108/85  Pulse: 74 79  Resp: 18 17  Temp: 97.6 F (36.4 C)   SpO2: 99% 92%    Recent laboratory studies:  Lab Results  Component Value Date   HGB 14.4 07/13/2024   HGB 13.9 02/07/2024   HGB 14.5 12/17/2023   Lab Results  Component Value Date   WBC 9.5 07/13/2024   PLT 200 07/13/2024   No results found for: INR Lab Results  Component Value Date   NA 137 07/13/2024   K 4.5 07/13/2024   CL 97 (L) 07/13/2024   CO2 30 07/13/2024   BUN 17 07/13/2024   CREATININE 0.80 07/13/2024   GLUCOSE 264 (H) 07/13/2024    Discharge Medications:   Allergies as of 07/22/2024       Reactions   Rosuvastatin Other (See Comments)   Cramps   Augmentin [amoxicillin-pot Clavulanate]    Hypotension    Codeine Other (See Comments)   Altered mental state   Dilaudid [hydromorphone Hcl] Other (  See Comments)   Hallucinations    Oxycodone -acetaminophen  Other (See Comments)   Other Reaction(s): Unknown        Medication List     TAKE these medications    acetaminophen  500 MG tablet Commonly known as: TYLENOL  Take 500 mg by mouth every 6 (six) hours as needed for moderate pain (pain score 4-6).   albuterol  108 (90 Base) MCG/ACT inhaler Commonly known as: VENTOLIN  HFA Inhale 2 puffs into the lungs every 4 (four) hours as needed.   aspirin  EC 81 MG tablet Take 81 mg by mouth daily.   brimonidine  0.2 % ophthalmic solution Commonly known as: ALPHAGAN  Place 1 drop into both eyes 2 (two) times daily. What changed: how to take this   carvedilol  3.125 MG tablet Commonly known as: COREG  Take 1 tablet (3.125 mg total) by mouth 2 (two) times daily with a meal.   chlorhexidine  4 % external liquid Commonly known as: HIBICLENS  Apply 15 mLs (1 Application total) topically as directed for 30 doses. Use as directed daily for 5 days every  other week for 6 weeks.   Comfort EZ Pen Needles 31G X 6 MM Misc Generic drug: Insulin  Pen Needle use as directed   AUM Mini Insulin  Pen Needle 32G X 6 MM Misc Generic drug: Insulin  Pen Needle Use 4 (four) times daily before meals and nightly   FreeStyle Freedom Lite w/Device Kit Use as directed to check blood 3 times a day.   FreeStyle Libre 14 Day Reader Espiridion Use as directed   [employer] 3 Reader [place] Use 1 each as directed Check blood sugars 4 times daily.   FreeStyle Libre 14 Day Sensor Misc Use as directed for glucose monitoring. (for glucose monitoring)   FreeStyle Libre 3 Plus Sensor Misc Use 1 each every 15 (fifteen) days   furosemide  20 MG tablet Commonly known as: LASIX  Take 1 tablet (20 mg total) by mouth daily.   gabapentin  300 MG capsule Commonly known as: NEURONTIN  Take 1 capsule (300 mg total) by mouth 2 (two) times daily AND 3 capsules (900 mg total) at bedtime. What changed: See the new instructions.   HYDROcodone -acetaminophen  5-325 MG tablet Commonly known as: NORCO/VICODIN Take 1-2 tablets by mouth every 4 (four) hours as needed for moderate pain (pain score 4-6).   insulin  lispro 100 UNIT/ML KwikPen Commonly known as: HUMALOG  Inject 3 Units subcutaneously 3 (three) times daily with meals Plus 2 for 50 above 150 correction. UP to 60 units daily.   iVIZIA Dry Eyes 0.5 % Soln Generic drug: Povidone (PF) Place 1 drop into the right eye 2 (two) times daily.   Jardiance  25 MG Tabs tablet Generic drug: empagliflozin  Take 1 tablet (25 mg total) by mouth daily with breakfast.   Lancets 33G Misc Use as directed to check blood 3 times a day.   meclizine  25 MG tablet Commonly known as: ANTIVERT  Take 1 tablet (25 mg total) by mouth 3 (three) times daily as needed for dizziness   mupirocin  ointment 2 % Commonly known as: BACTROBAN  Place 1 Application into the nose 2 (two) times daily for 60 doses. Use as directed 2 times daily for 5 days every  other week for 6 weeks.   nitroGLYCERIN  0.4 MG SL tablet Commonly known as: NITROSTAT  Place 1 tablet (0.4 mg total) under the tongue every 5 (five) minutes as needed for chest pain. Max is 3 doses per incident.   omeprazole  40 MG capsule Commonly known as: PRILOSEC Take 1 capsule (40 mg  total) by mouth 2 (two) times daily  before meals.   ondansetron  4 MG tablet Commonly known as: ZOFRAN  Take 1 tablet (4 mg total) by mouth every 6 (six) hours as needed for nausea.   Ozempic  (2 MG/DOSE) 8 MG/3ML Sopn Generic drug: Semaglutide  (2 MG/DOSE) Inject 2 mg into the skin once a week.   pioglitazone  30 MG tablet Commonly known as: ACTOS  Take 1 tablet (30 mg total) by mouth daily.   Precision QID Test test strip Generic drug: glucose blood Use as directed to check blood 3 times a day.   Repatha  SureClick 140 MG/ML Soaj Generic drug: Evolocumab  Inject 140 mg under the skin every 14 (fourteen) days.   spironolactone  50 MG tablet Commonly known as: ALDACTONE  Take 1 tablet (50 mg total) by mouth daily.   spironolactone  50 MG tablet Commonly known as: ALDACTONE  Take 1 tablet (50 mg total) by mouth daily.   SUMAtriptan  50 MG tablet Commonly known as: IMITREX  Take 50 mg by mouth every 2 (two) hours as needed for migraine. May repeat in 2 hours if headache persists or recurs.   tiZANidine  4 MG tablet Commonly known as: Zanaflex  Take 1 tablet (4 mg total) by mouth every 8 (eight) hours as needed for muscle spasms.   traMADol  50 MG tablet Commonly known as: ULTRAM  Take 1 tablet (50 mg total) by mouth every 6 (six) hours as needed for pain.   Tresiba  FlexTouch 100 UNIT/ML FlexTouch Pen Generic drug: insulin  degludec Inject 20 Units into the skin daily.   verapamil  360 MG 24 hr capsule Commonly known as: VERELAN  Take 1 capsule (360 mg total) by mouth at bedtime.        Diagnostic Studies: DG Shoulder Right Port Result Date: 07/21/2024 CLINICAL DATA:  Status post reverse  shoulder arthroplasty EXAM: RIGHT SHOULDER - 1 VIEW COMPARISON:  None Available. FINDINGS: Total arthroplasty of the RIGHT shoulder. No complicating features. Soft tissue changes as expected IMPRESSION: No complication following reverse shoulder arthroplasty Electronically Signed   By: Jackquline Boxer M.D.   On: 07/21/2024 16:07   US  OR NERVE BLOCK-IMAGE ONLY [facility]) Result Date: 07/21/2024 There is no interpretation for this exam.  This order is for images obtained during a surgical procedure.  Please See Surgeries Tab for more information regarding the procedure.    Disposition: Plan for d/c home today pending progress with PT.   Follow-up Information     Kip Lynwood Double, PA-C Follow up in 14 day(s).   Specialty: Physician Assistant Why: Orene [place] information: [address] [place] [address] [phone]                Signed: Lynwood LITTIE Kip PA-C 07/22/2024, 8:03 AM

## 2024-07-21 NOTE — Anesthesia Postprocedure Evaluation (Signed)
 Anesthesia Post Note  Patient: Robert Fernandez  Procedure(s) Performed: ARTHROPLASTY, SHOULDER, TOTAL, REVERSE (Right: Shoulder) STEROID INJECTION ELBOW (Right)  Patient location during evaluation: PACU Anesthesia Type: General Level of consciousness: awake and alert Pain management: pain level controlled Vital Signs Assessment: post-procedure vital signs reviewed and stable Respiratory status: spontaneous breathing, nonlabored ventilation and respiratory function stable Cardiovascular status: blood pressure returned to baseline and stable Postop Assessment: no apparent nausea or vomiting Anesthetic complications: no   No notable events documented.   Last Vitals:  Vitals:   07/21/24 1500 07/21/24 1524  BP: (!) 110/53 (!) 118/51  Pulse: 65 71  Resp: 16 17  Temp: 36.4 C   SpO2: 96% 94%    Last Pain:  Vitals:   07/21/24 1524  TempSrc: Oral  PainSc: 0-No pain                 Fairy POUR Hernando Reali

## 2024-07-21 NOTE — Plan of Care (Signed)
 Verbalizes understanding of plan of care, procedure and discharge

## 2024-07-21 NOTE — Discharge Instructions (Addendum)
Diet: As you were doing prior to hospitalization  ? ?Shower:  May shower but keep the wounds dry, use an occlusive plastic wrap, NO SOAKING IN TUB.  If the bandage gets wet, change with a clean dry gauze. ? ?Dressing:  You may change your dressing as needed. Change the dressing with sterile gauze dressing.   ? ?Activity:  Increase activity slowly as tolerated, but follow the weight bearing instructions below.  No lifting or driving for 6 weeks. ? ?Weight Bearing:   Non-weightbearing to the right arm. ? ?To prevent constipation: you may use a stool softener such as - ? ?Colace (over the counter) 100 mg by mouth twice a day  ?Drink plenty of fluids (prune juice may be helpful) and high fiber foods ?Miralax (over the counter) for constipation as needed.   ? ?Itching:  If you experience itching with your medications, try taking only a single pain pill, or even half a pain pill at a time.  You may take up to 10 pain pills per day, and you can also use benadryl over the counter for itching or also to help with sleep.  ? ?Precautions:  If you experience chest pain or shortness of breath - call 911 immediately for transfer to the hospital emergency department!! ? ?If you develop a fever greater that 101 F, purulent drainage from wound, increased redness or drainage from wound, or calf pain-Call [facility]                                              ?Follow- Up Appointment:  Please call for an appointment to be seen in 2 weeks at [facility]  ?

## 2024-07-21 NOTE — Anesthesia Procedure Notes (Signed)
 Procedure Name: Intubation Date/Time: 07/21/2024 10:20 AM  Performed by: Veronica Alm BROCKS, CRNAPre-anesthesia Checklist: Patient identified, Patient being monitored, Timeout performed, Emergency Drugs available and Suction available Patient Re-evaluated:Patient Re-evaluated prior to induction Oxygen Delivery Method: Circle system utilized Preoxygenation: Pre-oxygenation with 100% oxygen Induction Type: IV induction Laryngoscope Size: McGrath and 4 Grade View: Grade I Tube type: Oral Tube size: 7.0 mm Number of attempts: 1 Airway Equipment and Method: Stylet Placement Confirmation: ETT inserted through vocal cords under direct vision, positive ETCO2 and breath sounds checked- equal and bilateral Secured at: 22 cm Tube secured with: Tape Dental Injury: Teeth and Oropharynx as per pre-operative assessment  Comments: Full beard, did not attempt to mask.

## 2024-07-21 NOTE — Evaluation (Signed)
 Physical Therapy Evaluation Patient Details Name: Robert Fernandez MRN: [id number] DOB: 02/22/1956 Today's Date: 07/21/2024  History of Present Illness  Pt is a 68 y.o. male s/p R reverse total shoulder arthroplasty 07/21/24 with interscalene block.  PMH includes CABG, DM, h/o DVT, asthma, cirrhosis, htn, OSA, PONV, sleep apnea, B achilles tendon repair, back sx.  Clinical Impression  Prior to surgery, pt reports being modified independent with functional mobility (use of SPC as needed with ambulation); lives with his wife in 1 level home with short step to enter.  0/10 R shoulder/UE pain reported during session.  Pt sitting on EOB with nurse present upon PT arrival.  Currently pt is min assist to stand and CGA to ambulate short distance to/from bathroom (mild unsteadiness observed but no loss of balance noted; pt declining to use SPC to go short distance to/from bathroom).  Pt would currently benefit from skilled PT to address noted impairments and functional limitations (see below for any additional details).  Upon hospital discharge, pt would benefit from ongoing therapy.     If plan is discharge home, recommend the following: A little help with walking and/or transfers;A little help with bathing/dressing/bathroom;Assistance with cooking/housework;Assist for transportation;Help with stairs or ramp for entrance   Can travel by private vehicle    Yes    Equipment Recommendations [place] (pt has SPC at home already)  Recommendations for Other Services       Functional Status Assessment Patient has had a recent decline in their functional status and demonstrates the ability to make significant improvements in function in a reasonable and predictable amount of time.     Precautions / Restrictions Precautions Precautions: Shoulder Shoulder Interventions: Shoulder sling/immobilizer;Shoulder abduction pillow;At all times;Off for dressing/bathing/exercises Recall of Precautions/Restrictions:  Intact Restrictions Weight Bearing Restrictions Per Provider Order: Yes RUE Weight Bearing Per Provider Order: Non weight bearing      Mobility  Bed Mobility Overal bed mobility: Needs Assistance Bed Mobility: Sit to Supine       Sit to supine: Supervision, HOB elevated   General bed mobility comments: mild increased effort to bring B LE's into bed    Transfers Overall transfer level: Needs assistance Equipment used: 1 person hand held assist Transfers: Sit to/from Stand Sit to Stand: Min assist, From elevated surface           General transfer comment: feet blocked B (d/t pt reporting feeling like his feet were going to slip in socks); pt requesting L hand hold assist to stand    Ambulation/Gait Ambulation/Gait assistance: Contact guard assist Gait Distance (Feet):  (10 feet x2 (to/from bathroom)) Assistive device: None   Gait velocity: decreased     General Gait Details: increased B lateral sway; partial step through gait pattern; mild unsteadiness but no overt loss of balance (pt declining to use his [facility])  Stairs            Wheelchair Mobility     Tilt Bed    Modified Rankin (Stroke Patients Only)       Balance Overall balance assessment: Needs assistance Sitting-balance support: No upper extremity supported, Feet supported Sitting balance-Leahy Scale: Good Sitting balance - Comments: steady reaching within BOS with L UE   Standing balance support: No upper extremity supported, During functional activity Standing balance-Leahy Scale: Fair Standing balance comment: mild unsteadiness noted during ambulation but no overt loss of balance noted  Pertinent Vitals/Pain Pain Assessment Pain Assessment: 0-10 Pain Score: 0-No pain Pain Location: R shoulder Pain Intervention(s): Limited activity within patient's tolerance, Monitored during session    Home Living Family/patient expects to be discharged to::  Private residence Living Arrangements: Spouse/significant other Available Help at Discharge: Family (Spouse available until she starts back to work nights on Saturday.) Type of Home: House Home Access: Stairs to enter Entrance Stairs-Rails: None Entrance Stairs-Number of Steps: 1 (about 6 inch step)   Home Layout: One level Home Equipment: Cane - single point;Grab bars - tub/shower      Prior Function Prior Level of Function : Independent/Modified Independent             Mobility Comments: No falls in past 3-4 months.  Modified independent with ambulation (uses SPC in home and community as needed).       Extremity/Trunk Assessment   Upper Extremity Assessment Upper Extremity Assessment: Defer to OT evaluation;RUE deficits/detail RUE Deficits / Details: R UE in sling/immobilizer RUE: Unable to fully assess due to immobilization    Lower Extremity Assessment Lower Extremity Assessment: Generalized weakness    Cervical / Trunk Assessment Cervical / Trunk Assessment: Other exceptions Cervical / Trunk Exceptions: forward head/shoulders  Communication   Communication Communication: No apparent difficulties    Cognition Arousal: Alert Behavior During Therapy: WFL for tasks assessed/performed   PT - Cognitive impairments: No apparent impairments                         Following commands: Intact       Cueing Cueing Techniques: Verbal cues     General Comments General comments (skin integrity, edema, etc.): R UE in sling/immobilizer.  Nursing cleared pt for participation in physical therapy.  Pt agreeable to PT session.  Pt's wife present during session.    Exercises     Assessment/Plan    PT Assessment Patient needs continued PT services  PT Problem List Decreased strength;Decreased range of motion;Decreased activity tolerance;Decreased balance;Decreased mobility;Decreased knowledge of precautions;Decreased skin integrity;Pain       PT Treatment  Interventions DME instruction;Gait training;Stair training;Functional mobility training;Therapeutic activities;Therapeutic exercise;Balance training;Patient/family education    PT Goals (Current goals can be found in the Care Plan section)  Acute Rehab PT Goals Patient Stated Goal: to go home PT Goal Formulation: With patient/family Time For Goal Achievement: 08/04/24 Potential to Achieve Goals: Good    Frequency BID     Co-evaluation               AM-PAC PT 6 Clicks Mobility  Outcome Measure Help needed turning from your back to your side while in a flat bed without using bedrails?: None Help needed moving from lying on your back to sitting on the side of a flat bed without using bedrails?: A Little Help needed moving to and from a bed to a chair (including a wheelchair)?: A Little Help needed standing up from a chair using your arms (e.g., wheelchair or bedside chair)?: A Little Help needed to walk in hospital room?: A Little Help needed climbing 3-5 steps with a railing? : A Little 6 Click Score: 19    End of Session Equipment Utilized During Treatment: Other (comment) (R UE sling/immobilizer) Activity Tolerance: Patient tolerated treatment well Patient left: in bed;with call bell/phone within reach;with bed alarm set;with family/visitor present;with SCD's reapplied;Other (comment) (polar care in place/activated) Nurse Communication: Mobility status;Precautions PT Visit Diagnosis: Unsteadiness on feet (R26.81);Other abnormalities of gait and mobility (R26.89);Muscle  weakness (generalized) (M62.81);History of falling (Z91.81);Pain Pain - Right/Left: Right Pain - part of body: Shoulder    Time: 8353-8295 PT Time Calculation (min) (ACUTE ONLY): 18 min   Charges:   PT Evaluation $PT Eval Low Complexity: 1 Low   PT General Charges $$ ACUTE PT VISIT: 1 Visit        Damien Caulk, PT 07/21/24, 5:25 PM

## 2024-07-22 ENCOUNTER — Other Ambulatory Visit: Payer: Self-pay

## 2024-07-22 DIAGNOSIS — Z9889 Other specified postprocedural states: Secondary | ICD-10-CM | POA: Diagnosis not present

## 2024-07-22 DIAGNOSIS — M7581 Other shoulder lesions, right shoulder: Secondary | ICD-10-CM | POA: Diagnosis not present

## 2024-07-22 DIAGNOSIS — Z87891 Personal history of nicotine dependence: Secondary | ICD-10-CM | POA: Diagnosis not present

## 2024-07-22 DIAGNOSIS — M19011 Primary osteoarthritis, right shoulder: Secondary | ICD-10-CM | POA: Diagnosis not present

## 2024-07-22 DIAGNOSIS — M879 Osteonecrosis, unspecified: Secondary | ICD-10-CM | POA: Diagnosis not present

## 2024-07-22 DIAGNOSIS — M7521 Bicipital tendinitis, right shoulder: Secondary | ICD-10-CM | POA: Diagnosis not present

## 2024-07-22 DIAGNOSIS — R0602 Shortness of breath: Secondary | ICD-10-CM | POA: Diagnosis not present

## 2024-07-22 DIAGNOSIS — E119 Type 2 diabetes mellitus without complications: Secondary | ICD-10-CM | POA: Diagnosis not present

## 2024-07-22 DIAGNOSIS — M75121 Complete rotator cuff tear or rupture of right shoulder, not specified as traumatic: Secondary | ICD-10-CM | POA: Diagnosis not present

## 2024-07-22 DIAGNOSIS — M7501 Adhesive capsulitis of right shoulder: Secondary | ICD-10-CM | POA: Diagnosis not present

## 2024-07-22 LAB — GLUCOSE, CAPILLARY: Glucose-Capillary: 204 mg/dL — ABNORMAL HIGH (ref 70–99)

## 2024-07-22 MED ORDER — ONDANSETRON HCL 4 MG PO TABS: 4.0000 mg | ORAL_TABLET | Freq: Four times a day (QID) | ORAL | Status: DC | PRN

## 2024-07-22 MED ORDER — ONDANSETRON 4 MG PO TBDP
4.0000 mg | ORAL_TABLET | Freq: Three times a day (TID) | ORAL | 0 refills | Status: DC | PRN
  Filled 2024-07-22: qty 20, 7d supply, fill #0

## 2024-07-22 MED ORDER — HYDROCODONE-ACETAMINOPHEN 5-325 MG PO TABS: 1.0000 | ORAL_TABLET | ORAL | Status: DC | PRN

## 2024-07-22 MED ORDER — HYDROCODONE-ACETAMINOPHEN 5-325 MG PO TABS
1.0000 | ORAL_TABLET | ORAL | 0 refills | Status: DC | PRN
  Filled 2024-07-22: qty 30, 3d supply, fill #0

## 2024-07-22 NOTE — Inpatient Diabetes Management (Signed)
 Inpatient Diabetes Program Recommendations  AACE/ADA: New Consensus Statement on Inpatient Glycemic Control (2015)  Target Ranges:  Prepandial:   less than 140 mg/dL      Peak postprandial:   less than 180 mg/dL (1-2 hours)      Critically ill patients:  140 - 180 mg/dL   Lab Results  Component Value Date   GLUCAP 204 (H) 07/22/2024   HGBA1C 9.4 (H) 07/13/2024    Patient remains in short stay surgical area and plans to D/C home today. Spoke with wife regarding A1c due to patient was busy with other disciplines. Patient is seeing a new endocrinologist @ [facility] and has just adjusted medications for diabetes management. Aware of A1c 9.4. No needs identified and will followup with endocrinologist.  Thank you, Jobeth Pangilinan E. Onofre Gains, RN, MSN, CDCES  Diabetes Coordinator Inpatient Glycemic Control Team Team Pager [phone] (8am-5pm) 07/22/2024 9:05 AM

## 2024-07-22 NOTE — Plan of Care (Signed)
 Documented

## 2024-07-22 NOTE — Evaluation (Signed)
 Occupational Therapy Evaluation Patient Details Name: Robert Fernandez MRN: [id number] DOB: 1956-12-12 Today's Date: 07/22/2024   History of Present Illness   Pt is a 68 y.o. male s/p R reverse total shoulder arthroplasty 07/21/24 with interscalene block.  PMH includes CABG, DM, h/o DVT, asthma, cirrhosis, htn, OSA, PONV, sleep apnea, B achilles tendon repair, back sx.     Clinical Impressions Patient was seen for an OT evaluation this date. Pt lives with his wife in one level home with no stairs to enter. Prior to surgery, pt was independent amb with a SPC as needed. Pt has orders for RUE to be immobilized and will be NWBing per MD. Patient presents with impaired strength/ROM at current post op state, these impairments result in a decreased ability to perform self care tasks requiring mod assist for UB dressing and max assist for application of polar care, compression stockings, and sling/immobilizer. Pt amb within room to BR with initially CGA for safety, progressed to supervision. Pt STS from regular toilet seat with no physical assistance. Pt and his wife were instructed in polar care mgt, compression stockings mgt, sling/immobilizer mgt, ROM exercises for RUE (with instructions for no shoulder exercises until full sensation has returned), RUE precautions, adaptive strategies for bathing/dressing/toileting/grooming, positioning and home/routines modifications to maximize falls prevention, safety, and independence. Handout provided. OT adjusted sling/immobilizer and polar care to improve comfort, optimize positioning, and to maximize skin integrity/safety. Pt verbalized understanding of all education/training provided. Pt will benefit from skilled OT services to address these limitations and improve independence in daily tasks.     If plan is discharge home, recommend the following:   A little help with bathing/dressing/bathroom;Assist for transportation     Functional Status Assessment    Patient has had a recent decline in their functional status and demonstrates the ability to make significant improvements in function in a reasonable and predictable amount of time.     Equipment Recommendations   None recommended by OT     Recommendations for Other Services         Precautions/Restrictions   Precautions Precautions: Shoulder Shoulder Interventions: Shoulder sling/immobilizer;Shoulder abduction pillow;At all times;Off for dressing/bathing/exercises Precaution Booklet Issued: Yes (comment) Recall of Precautions/Restrictions: Intact Required Braces or Orthoses: Sling Restrictions Weight Bearing Restrictions Per Provider Order: Yes RUE Weight Bearing Per Provider Order: Non weight bearing     Mobility Bed Mobility Overal bed mobility: Needs Assistance Bed Mobility: Supine to Sit, Sit to Supine     Supine to sit: Supervision Sit to supine: Supervision   General bed mobility comments: Used bed rails for UE support    Transfers Overall transfer level: Needs assistance Equipment used: None Transfers: Sit to/from Stand Sit to Stand: Supervision           General transfer comment: MINA for RUE coming to standing positon from sitting on the EOB, pt states he utilizes lift chair at home      Balance Overall balance assessment: Needs assistance Sitting-balance support: No upper extremity supported, Feet supported Sitting balance-Leahy Scale: Good Sitting balance - Comments: steady reaching within BOS with L UE   Standing balance support: Single extremity supported, During functional activity, Reliant on assistive device for balance Standing balance-Leahy Scale: Fair Standing balance comment: Improved standing with mobility after initial STS                           ADL either performed or assessed with clinical judgement  ADL Overall ADL's : Needs assistance/impaired Eating/Feeding: Set up;Sitting   Grooming: Wash/dry  hands;Standing Grooming Details (indicate cue type and reason): Sink level grooming         Upper Body Dressing : Moderate assistance donning shirt;Sitting, MAXA polar care and sling; sitting   Upper Body Dressing Details (indicate cue type and reason): Don and doff sling, donning button up clothing Lower Body Dressing: Sit to/from stand;Supervision/safety   Toilet Transfer: Supervision/safety;Ambulation;Regular Toilet   Toileting- Architect and Hygiene: Supervision/safety;Sit to/from stand       Functional mobility during ADLs: Contact guard assist;Supervision/safety General ADL Comments: Sink level grooming with no supervision, don/doffing sling with MODA     Vision Patient Visual Report: No change from baseline                         Pertinent Vitals/Pain Pain Assessment Pain Assessment: No/denies pain     Extremity/Trunk Assessment Upper Extremity Assessment Upper Extremity Assessment: Overall WFL for tasks assessed RUE Deficits / Details: R UE in sling/immobilizer   Lower Extremity Assessment Lower Extremity Assessment: Defer to PT evaluation;Overall [facility] for tasks assessed   Cervical / Trunk Assessment Cervical / Trunk Assessment: Other exceptions Cervical / Trunk Exceptions: forward head/shoulders   Communication Communication Communication: No apparent difficulties   Cognition Arousal: Alert Behavior During Therapy: WFL for tasks assessed/performed Cognition: No apparent impairments             OT - Cognition Comments: A/Ox4                 Following commands: Intact       Cueing  General Comments   Cueing Techniques: Verbal cues  RUE sling/immobilizer repositioned and adjusted   Exercises Exercises: Other exercises Other Exercises Other Exercises: Edu: Role of OT eval, safe ADL completion, DME safety during mobility, sling and polar care education         Home Living Family/patient expects to be discharged  to:: Private residence Living Arrangements: Spouse/significant other Available Help at Discharge: Family Type of Home: House Home Access: Stairs to enter [employer] of Steps: 1 (about 6 inch step) Entrance Stairs-Rails: None Home Layout: One level     Bathroom Shower/Tub: Producer, television/film/video: Standard     Home Equipment: Cane - single point;Grab bars - tub/shower          Prior Functioning/Environment Prior Level of Function : Independent/Modified Independent             Mobility Comments: No falls in past 3-4 months.  Modified independent with ambulation (uses SPC in home and community as needed). ADLs Comments: Indep                 OT Goals(Current goals can be found in the care plan section)   Acute Rehab OT Goals Patient Stated Goal: Go home OT Goal Formulation: With patient/family Time For Goal Achievement: 08/05/24 Potential to Achieve Goals: Good ADL Goals Pt Will Perform Grooming: with modified independence;standing Pt Will Perform Lower Body Dressing: with modified independence;sit to/from stand Pt Will Transfer to Toilet: with modified independence;ambulating Pt Will Perform Toileting - Clothing Manipulation and hygiene: with modified independence;sit to/from stand   AM-PAC OT 6 Clicks Daily Activity     Outcome Measure Help from another person eating meals?: None Help from another person taking care of personal grooming?: A Little Help from another person toileting, which includes using toliet, bedpan, or urinal?: None Help  from another person bathing (including washing, rinsing, drying)?: A Little Help from another person to put on and taking off regular upper body clothing?: A Lot Help from another person to put on and taking off regular lower body clothing?: None 6 Click Score: 20   End of Session Equipment Utilized During Treatment: Gait belt Nurse Communication: Mobility status  Activity Tolerance: Patient  tolerated treatment well Patient left: in bed;with call bell/phone within reach;with family/visitor present                   Time: 9157-9084 OT Time Calculation (min): 33 min Charges:  OT General Charges $OT Visit: 1 Visit OT Evaluation $OT Eval Low Complexity: 1 Low OT Treatments $Self Care/Home Management : 8-22 mins  Larraine Colas M.S. OTR/L  07/22/24, 9:34 AM

## 2024-07-22 NOTE — TOC Transition Note (Signed)
 Transition of Care [facility]) - Discharge Note   Patient Details  Name: Robert Fernandez MRN: [id number] Date of Birth: 12-27-1955  Transition of Care [facility] CM/SW Contact:  Alvaro Louder, LCSW Phone Number: 07/22/2024, 10:54 AM   Clinical Narrative:  LCSWA met with patient and spouse at bedside. He indicated that he lives with his wife Cosme and that he was able to dress himself prior to admission. He indicated that he has DME such as a shower chair and a cane that he uses at home. He stated that he used the [place] to ambulate around his home. The patient state that his wife, grandson, and son will help him at discharge. He indicated that his medication are affordable and his wife will take him to appointments. He stated at discharge his wife will take him home.   LCSWA discussed PT recommendation of OP rehab. The patient indicated that he has already been set up with the [facility] and starts rehab Friday.   TOC Signing off    Final next level of care: OP Rehab Barriers to Discharge: No Barriers Identified   Patient Goals and CMS Choice            Discharge Placement              Patient chooses bed at:  (Home) Patient to be transferred to facility by: Wife Verneita Name of family member notified: Self Patient and family notified of of transfer: 07/22/24  Discharge Plan and Services Additional resources added to the After Visit Summary for                                       Social Drivers of Health (SDOH) Interventions SDOH Screenings   Food Insecurity: No Food Insecurity (07/21/2024)  Housing: Low Risk  (07/21/2024)  Transportation Needs: No Transportation Needs (07/21/2024)  Utilities: Not At Risk (07/21/2024)  Depression (PHQ2-9): Low Risk  (06/25/2024)  Financial Resource Strain: Patient Declined (04/09/2024)   Received from [facility]  Physical Activity: Insufficiently Active (09/29/2020)   Received from [facility]   Social Connections: Moderately Integrated (07/21/2024)  Stress: No Stress Concern Present (09/29/2020)   Received from [facility]  Tobacco Use: Medium Risk (07/21/2024)     Readmission Risk Interventions     No data to display

## 2024-07-22 NOTE — Progress Notes (Signed)
 Physical Therapy Treatment Patient Details Name: Robert Fernandez MRN: [id number] DOB: 1956-02-21 Today's Date: 07/22/2024   History of Present Illness Pt is a 68 y.o. male s/p R reverse total shoulder arthroplasty 07/21/24 with interscalene block.  PMH includes CABG, DM, h/o DVT, asthma, cirrhosis, htn, OSA, PONV, sleep apnea, B achilles tendon repair, back sx.    PT Comments  Pt was long sitting in bed upon arrival. He is A and O x4. Agreeable to session. Supportive spouse present and will be assisting at DC. Pt was able to exit bed, stand, and tolerate ambulation with SPC. Educated on polar care use, importance of hand/wrist/elbow AROM, and importance of routine mobility. Pt did perform stairs to simulate home entry. Pt is already scheduled for OPPT that starts Friday. He will continue to benefit from skilled PT.     If plan is discharge home, recommend the following: A little help with walking and/or transfers;A little help with bathing/dressing/bathroom;Assistance with cooking/housework;Assist for transportation;Help with stairs or ramp for entrance     Equipment Recommendations  Cane       Precautions / Restrictions Precautions Precautions: Shoulder Shoulder Interventions: Shoulder sling/immobilizer;Shoulder abduction pillow;At all times;Off for dressing/bathing/exercises Precaution Booklet Issued: No Recall of Precautions/Restrictions: Intact Required Braces or Orthoses: Sling Restrictions Weight Bearing Restrictions Per Provider Order: Yes RUE Weight Bearing Per Provider Order: Non weight bearing     Mobility  Bed Mobility Overal bed mobility: Needs Assistance Bed Mobility: Supine to Sit, Sit to Supine  Supine to sit: Min assist Sit to supine: Supervision        Transfers Overall transfer level: Needs assistance Equipment used: Straight cane Transfers: Sit to/from Stand Sit to Stand: Supervision, From elevated surface  General transfer comment: pt has lift chair at  home    Ambulation/Gait Ambulation/Gait assistance: Supervision Gait Distance (Feet): 200 Feet Assistive device: Straight cane Gait Pattern/deviations: Step-through pattern Gait velocity: decreased  General Gait Details: pt was able to ambulate ~ 200 ft with SPC. some unsteadiness noted however no intervention required   Stairs Stairs: Yes Stairs assistance: Supervision Stair Management: No rails, Step to pattern, Forwards, With cane Number of Stairs: 3      Balance Overall balance assessment: Needs assistance Sitting-balance support: No upper extremity supported, Feet supported Sitting balance-Leahy Scale: Good     Standing balance support: Single extremity supported, During functional activity, Reliant on assistive device for balance Standing balance-Leahy Scale: Fair       Hotel manager: No apparent difficulties  Cognition Arousal: Alert Behavior During Therapy: WFL for tasks assessed/performed   PT - Cognitive impairments: No apparent impairments   Following commands: Intact      Cueing Cueing Techniques: Verbal cues         Pertinent Vitals/Pain Pain Assessment Pain Assessment: 0-10 Pain Score: 4  Pain Location: R shoulder Pain Descriptors / Indicators: Aching, Discomfort Pain Intervention(s): Limited activity within patient's tolerance, Monitored during session, Premedicated before session, Repositioned     PT Goals (current goals can now be found in the care plan section) Acute Rehab PT Goals Patient Stated Goal: to go home Progress towards PT goals: Progressing toward goals    Frequency    BID       AM-PAC PT 6 Clicks Mobility   Outcome Measure  Help needed turning from your back to your side while in a flat bed without using bedrails?: None Help needed moving from lying on your back to sitting on the side of a flat bed without  using bedrails?: A Little Help needed moving to and from a bed to a chair  (including a wheelchair)?: A Little Help needed standing up from a chair using your arms (e.g., wheelchair or bedside chair)?: A Little Help needed to walk in hospital room?: A Little Help needed climbing 3-5 steps with a railing? : A Little 6 Click Score: 19    End of Session   Activity Tolerance: Patient tolerated treatment well Patient left: in bed;with call bell/phone within reach;with bed alarm set;with family/visitor present;with SCD's reapplied;Other (comment) Nurse Communication: Mobility status;Precautions PT Visit Diagnosis: Unsteadiness on feet (R26.81);Other abnormalities of gait and mobility (R26.89);Muscle weakness (generalized) (M62.81);History of falling (Z91.81);Pain Pain - Right/Left: Right Pain - part of body: Shoulder     Time: 9256-9187 PT Time Calculation (min) (ACUTE ONLY): 29 min  Charges:    $Gait Training: 8-22 mins $Therapeutic Activity: 8-22 mins PT General Charges $$ ACUTE PT VISIT: 1 Visit                    Rankin Essex PTA 07/22/24, 8:30 AM

## 2024-07-22 NOTE — Progress Notes (Signed)
 Subjective: 1 Day Post-Op Procedure(s) (LRB): ARTHROPLASTY, SHOULDER, TOTAL, REVERSE (Right) STEROID INJECTION ELBOW (Right) Patient reports pain as mild.   Patient is well, and has had no acute complaints or problems Plan is to go Home after hospital stay. Negative for chest pain and shortness of breath Fever: no Gastrointestinal:Negative for nausea and vomiting  Objective: Vital signs in last 24 hours: Temp:  [97.3 F (36.3 C)-98.1 F (36.7 C)] 97.6 F (36.4 C) (07/29 2207) Pulse Rate:  [62-79] 79 (07/30 0746) Resp:  [15-18] 17 (07/30 0746) BP: (91-131)/(51-85) 108/85 (07/30 0746) SpO2:  [92 %-100 %] 92 % (07/30 0746) Weight:  [90.7 kg] 90.7 kg (07/29 0833)  Intake/Output from previous day:  Intake/Output Summary (Last 24 hours) at 07/22/2024 0757 Last data filed at 07/21/2024 1754 Gross per 24 hour  Intake 690.93 ml  Output 200 ml  Net 490.93 ml    Intake/Output this shift: No intake/output data recorded.  Labs: No results for input(s): HGB in the last 72 hours. No results for input(s): WBC, RBC, HCT, PLT in the last 72 hours. No results for input(s): NA, K, CL, CO2, BUN, CREATININE, GLUCOSE, CALCIUM in the last 72 hours. No results for input(s): LABPT, INR in the last 72 hours.   EXAM General - Patient is Alert, Appropriate, and Oriented Extremity - Right shoulder sling intact. Honeycomb dressing intact without any drainage. Moving hand and fingers well. Decreased sensation over the lateral aspect of arm from recent Exparel  block. Dressing/Incision - clean, dry, no drainage Motor Function - intact, moving foot and toes well on exam.  Abdomen soft with intact bowel sounds this AM.  Past Medical History:  Diagnosis Date   Aortic atherosclerosis (HCC)    Arthritis    Asthma    Bacteremia due to methicillin susceptible Staphylococcus aureus (MSSA) 05/2016   a.) s/p CABG   Bilateral occipital neuralgia 05/20/2024   Cervical  spondylosis with radiculopathy    Cirrhosis of liver (HCC)    Coronary artery disease    DDD (degenerative disc disease), lumbar    Diabetic retinopathy of both eyes (HCC)    Diastolic dysfunction    Dysfunction of Eustachian tube, bilateral 09/29/2020   Erectile dysfunction 08/13/2019   GERD (gastroesophageal reflux disease)    Hepatosplenomegaly    Hiatal hernia    History of deep venous thrombosis    Hyperlipidemia    Hypertension    Left adrenal mass (HCC)    Long-term use of aspirin  therapy    Meningioma (HCC)    Methicillin resistant Staphylococcus epidermidis infection 05/2016   a.) sternal wound infection s/p CABG   Migraines    Neovascular glaucoma, indeterminate stage, left    Neuropathy    Nocturnal leg cramps 09/29/2020   Nontraumatic complete tear of right rotator cuff    Obstructive sleep apnea    a.) unable to tolerate nocturnal PAP therapy d/t eye condition   Osteoarthritis    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    Assessment/Plan: 1 Day Post-Op Procedure(s) (LRB): ARTHROPLASTY, SHOULDER, TOTAL, REVERSE (Right) STEROID INJECTION ELBOW (Right) Principal Problem:   Status post reverse arthroplasty of shoulder, right  Estimated body mass index is 27.12 kg/m as calculated from the following:   Height as of this encounter: 6' (1.829 m).   Weight as of this encounter: 90.7 kg. Advance diet Up with therapy D/C IV fluids when tolerating po  intake.  Vitals reviewed this AM. Up with therapy today. Plan for discharge home today with HHPT.  DVT Prophylaxis - Lovenox  and TED hose NWB to the right arm.  DOROTHA Gustavo Level, PA-C [facility] 07/22/2024, 7:57 AM

## 2024-07-22 NOTE — Progress Notes (Signed)
 Pts BP 102/49, Pt asymptomatic, Robert Level, PA notified. Per Robert pt ok to discharge.

## 2024-07-22 NOTE — Plan of Care (Signed)
  Problem: Activity: Goal: Ability to tolerate increased activity will improve Outcome: Progressing   Problem: Pain Management: Goal: Pain level will decrease with appropriate interventions Outcome: Progressing   

## 2024-07-22 NOTE — Progress Notes (Signed)
 Discharge Note:  Pt given discharge instructions and verbalized understanding. Shoulder immobilizer on. TED hose on both legs. Pt wheeled to car by staff, wife providing transportation home.

## 2024-07-23 ENCOUNTER — Encounter: Payer: Self-pay | Admitting: Surgery

## 2024-07-24 DIAGNOSIS — M25511 Pain in right shoulder: Secondary | ICD-10-CM | POA: Diagnosis not present

## 2024-07-24 DIAGNOSIS — Z96611 Presence of right artificial shoulder joint: Secondary | ICD-10-CM | POA: Diagnosis not present

## 2024-07-27 DIAGNOSIS — Z96611 Presence of right artificial shoulder joint: Secondary | ICD-10-CM | POA: Diagnosis not present

## 2024-07-27 DIAGNOSIS — M25511 Pain in right shoulder: Secondary | ICD-10-CM | POA: Diagnosis not present

## 2024-07-28 ENCOUNTER — Other Ambulatory Visit: Payer: Self-pay

## 2024-07-28 MED ORDER — HYDROCODONE-ACETAMINOPHEN 5-325 MG PO TABS
1.0000 | ORAL_TABLET | ORAL | 0 refills | Status: DC | PRN
  Filled 2024-07-28: qty 30, 3d supply, fill #0

## 2024-07-31 ENCOUNTER — Other Ambulatory Visit: Payer: Self-pay

## 2024-07-31 DIAGNOSIS — M150 Primary generalized (osteo)arthritis: Secondary | ICD-10-CM | POA: Diagnosis not present

## 2024-07-31 DIAGNOSIS — K7469 Other cirrhosis of liver: Secondary | ICD-10-CM | POA: Diagnosis not present

## 2024-07-31 DIAGNOSIS — M2550 Pain in unspecified joint: Secondary | ICD-10-CM | POA: Diagnosis not present

## 2024-07-31 DIAGNOSIS — M47812 Spondylosis without myelopathy or radiculopathy, cervical region: Secondary | ICD-10-CM | POA: Diagnosis not present

## 2024-07-31 MED ORDER — DULOXETINE HCL 20 MG PO CPEP
20.0000 mg | ORAL_CAPSULE | Freq: Every day | ORAL | 11 refills | Status: DC
  Filled 2024-07-31: qty 30, 30d supply, fill #0

## 2024-08-04 ENCOUNTER — Emergency Department

## 2024-08-04 ENCOUNTER — Emergency Department
Admission: EM | Admit: 2024-08-04 | Discharge: 2024-08-05 | Discharge status: Against Medical Advice | Attending: Emergency Medicine | Admitting: Emergency Medicine

## 2024-08-04 DIAGNOSIS — Z5321 Procedure and treatment not carried out due to patient leaving prior to being seen by health care provider: Secondary | ICD-10-CM | POA: Insufficient documentation

## 2024-08-04 DIAGNOSIS — R112 Nausea with vomiting, unspecified: Secondary | ICD-10-CM | POA: Diagnosis not present

## 2024-08-04 DIAGNOSIS — Z96611 Presence of right artificial shoulder joint: Secondary | ICD-10-CM | POA: Diagnosis not present

## 2024-08-04 DIAGNOSIS — M79602 Pain in left arm: Secondary | ICD-10-CM | POA: Insufficient documentation

## 2024-08-04 DIAGNOSIS — R079 Chest pain, unspecified: Secondary | ICD-10-CM | POA: Insufficient documentation

## 2024-08-04 DIAGNOSIS — R197 Diarrhea, unspecified: Secondary | ICD-10-CM | POA: Insufficient documentation

## 2024-08-04 DIAGNOSIS — R000 Tachycardia, unspecified: Secondary | ICD-10-CM | POA: Diagnosis not present

## 2024-08-04 DIAGNOSIS — M79603 Pain in arm, unspecified: Secondary | ICD-10-CM | POA: Diagnosis not present

## 2024-08-04 LAB — BASIC METABOLIC PANEL WITH GFR
Anion gap: 14 (ref 5–15)
BUN: 29 mg/dL — ABNORMAL HIGH (ref 8–23)
CO2: 25 mmol/L (ref 22–32)
Calcium: 9.8 mg/dL (ref 8.9–10.3)
Chloride: 94 mmol/L — ABNORMAL LOW (ref 98–111)
Creatinine, Ser: 0.78 mg/dL (ref 0.61–1.24)
GFR, Estimated: >60 mL/min (ref >60)
Glucose, Bld: 105 mg/dL — ABNORMAL HIGH (ref 70–99)
Potassium: 4.2 mmol/L (ref 3.5–5.1)
Sodium: 133 mmol/L — ABNORMAL LOW (ref 135–145)

## 2024-08-04 LAB — CBC
HCT: 42.1 % (ref 39.0–52.0)
Hemoglobin: 14.1 g/dL (ref 13.0–17.0)
MCH: 30.5 pg (ref 26.0–34.0)
MCHC: 33.5 g/dL (ref 30.0–36.0)
MCV: 91.1 fL (ref 80.0–100.0)
Platelets: 355 K/uL (ref 150–400)
RBC: 4.62 MIL/uL (ref 4.22–5.81)
RDW: 14.6 % (ref 11.5–15.5)
WBC: 13.7 K/uL — ABNORMAL HIGH (ref 4.0–10.5)
nRBC: 0 % (ref 0.0–0.2)

## 2024-08-04 LAB — TROPONIN I (HIGH SENSITIVITY): Troponin I (High Sensitivity): 5 ng/L (ref <18)

## 2024-08-04 NOTE — ED Triage Notes (Signed)
 Pt presents to the ED via [facility] from home. Pt reports intermittent chest pain x2 days. Pt has a hx of MI with open heart surgery. Pt reports left arm pain. Reports N/V/D since Sunday.   Pt took 324mg  ASA pta 140/80 CBG 100 HR 101 - usually in the 50's 97% RA 20g IV left AC

## 2024-08-05 ENCOUNTER — Ambulatory Visit: Admitting: Physician Assistant

## 2024-08-07 ENCOUNTER — Other Ambulatory Visit: Payer: Self-pay

## 2024-08-07 MED ORDER — HYDROCODONE-ACETAMINOPHEN 5-325 MG PO TABS
1.0000 | ORAL_TABLET | ORAL | 0 refills | Status: DC | PRN
  Filled 2024-08-07: qty 30, 3d supply, fill #0

## 2024-08-10 ENCOUNTER — Other Ambulatory Visit: Payer: Self-pay | Admitting: Family Medicine

## 2024-08-10 DIAGNOSIS — M25511 Pain in right shoulder: Secondary | ICD-10-CM | POA: Diagnosis not present

## 2024-08-10 DIAGNOSIS — Z96611 Presence of right artificial shoulder joint: Secondary | ICD-10-CM | POA: Diagnosis not present

## 2024-08-10 DIAGNOSIS — R410 Disorientation, unspecified: Secondary | ICD-10-CM

## 2024-08-10 DIAGNOSIS — G453 Amaurosis fugax: Secondary | ICD-10-CM | POA: Diagnosis not present

## 2024-08-14 ENCOUNTER — Ambulatory Visit
Admission: RE | Admit: 2024-08-14 | Discharge: 2024-08-14 | Disposition: A | Discharge status: Home / Self Care | Source: Ambulatory Visit | Attending: Family Medicine | Admitting: Family Medicine

## 2024-08-14 DIAGNOSIS — G453 Amaurosis fugax: Secondary | ICD-10-CM | POA: Diagnosis not present

## 2024-08-14 DIAGNOSIS — R410 Disorientation, unspecified: Secondary | ICD-10-CM | POA: Insufficient documentation

## 2024-08-14 DIAGNOSIS — R42 Dizziness and giddiness: Secondary | ICD-10-CM | POA: Diagnosis not present

## 2024-08-14 MED ORDER — GADOBUTROL 1 MMOL/ML IV SOLN
9.0000 mL | Freq: Once | INTRAVENOUS | Status: AC | PRN
  Administered 2024-08-14: 9 mL via INTRAVENOUS

## 2024-08-17 DIAGNOSIS — E113513 Type 2 diabetes mellitus with proliferative diabetic retinopathy with macular edema, bilateral: Secondary | ICD-10-CM | POA: Diagnosis not present

## 2024-08-17 DIAGNOSIS — H35373 Puckering of macula, bilateral: Secondary | ICD-10-CM | POA: Diagnosis not present

## 2024-08-18 ENCOUNTER — Other Ambulatory Visit: Payer: Self-pay

## 2024-08-18 DIAGNOSIS — M25511 Pain in right shoulder: Secondary | ICD-10-CM | POA: Diagnosis not present

## 2024-08-18 DIAGNOSIS — Z96611 Presence of right artificial shoulder joint: Secondary | ICD-10-CM | POA: Diagnosis not present

## 2024-08-19 ENCOUNTER — Other Ambulatory Visit: Payer: Self-pay

## 2024-08-20 ENCOUNTER — Other Ambulatory Visit: Payer: Self-pay

## 2024-08-20 MED ORDER — INSULIN DEGLUDEC FLEXTOUCH 100 UNIT/ML ~~LOC~~ SOPN
24.0000 [IU] | PEN_INJECTOR | Freq: Every day | SUBCUTANEOUS | 4 refills | Status: AC
  Filled 2024-08-20: qty 30, 75d supply, fill #0

## 2024-08-21 ENCOUNTER — Other Ambulatory Visit: Payer: Self-pay

## 2024-08-21 DIAGNOSIS — Z794 Long term (current) use of insulin: Secondary | ICD-10-CM | POA: Diagnosis not present

## 2024-08-21 DIAGNOSIS — G8929 Other chronic pain: Secondary | ICD-10-CM | POA: Diagnosis not present

## 2024-08-21 DIAGNOSIS — M25511 Pain in right shoulder: Secondary | ICD-10-CM | POA: Diagnosis not present

## 2024-08-21 DIAGNOSIS — E1165 Type 2 diabetes mellitus with hyperglycemia: Secondary | ICD-10-CM | POA: Diagnosis not present

## 2024-08-21 DIAGNOSIS — Z96611 Presence of right artificial shoulder joint: Secondary | ICD-10-CM | POA: Diagnosis not present

## 2024-08-21 DIAGNOSIS — M6281 Muscle weakness (generalized): Secondary | ICD-10-CM | POA: Diagnosis not present

## 2024-08-21 DIAGNOSIS — M25611 Stiffness of right shoulder, not elsewhere classified: Secondary | ICD-10-CM | POA: Diagnosis not present

## 2024-08-21 MED ORDER — OZEMPIC (1 MG/DOSE) 4 MG/3ML ~~LOC~~ SOPN
1.0000 mg | PEN_INJECTOR | SUBCUTANEOUS | 3 refills | Status: AC
  Filled 2024-08-21 – 2025-01-04 (×2): qty 3, 28d supply, fill #0
  Filled 2025-01-04: qty 3, 28d supply, fill #1

## 2024-08-25 ENCOUNTER — Other Ambulatory Visit: Payer: Self-pay

## 2024-08-25 MED ORDER — HYDROCODONE-ACETAMINOPHEN 5-325 MG PO TABS
1.0000 | ORAL_TABLET | ORAL | 0 refills | Status: DC | PRN
  Filled 2024-08-25: qty 30, 3d supply, fill #0

## 2024-08-26 DIAGNOSIS — M25511 Pain in right shoulder: Secondary | ICD-10-CM | POA: Diagnosis not present

## 2024-08-26 DIAGNOSIS — Z96611 Presence of right artificial shoulder joint: Secondary | ICD-10-CM | POA: Diagnosis not present

## 2024-08-27 DIAGNOSIS — E782 Mixed hyperlipidemia: Secondary | ICD-10-CM | POA: Diagnosis not present

## 2024-08-27 DIAGNOSIS — M25511 Pain in right shoulder: Secondary | ICD-10-CM | POA: Diagnosis not present

## 2024-08-27 DIAGNOSIS — Z794 Long term (current) use of insulin: Secondary | ICD-10-CM | POA: Diagnosis not present

## 2024-08-27 DIAGNOSIS — D329 Benign neoplasm of meninges, unspecified: Secondary | ICD-10-CM | POA: Diagnosis not present

## 2024-08-27 DIAGNOSIS — Z1329 Encounter for screening for other suspected endocrine disorder: Secondary | ICD-10-CM | POA: Diagnosis not present

## 2024-08-27 DIAGNOSIS — R519 Headache, unspecified: Secondary | ICD-10-CM | POA: Diagnosis not present

## 2024-08-27 DIAGNOSIS — E1141 Type 2 diabetes mellitus with diabetic mononeuropathy: Secondary | ICD-10-CM | POA: Diagnosis not present

## 2024-08-27 DIAGNOSIS — R410 Disorientation, unspecified: Secondary | ICD-10-CM | POA: Diagnosis not present

## 2024-08-27 DIAGNOSIS — G8929 Other chronic pain: Secondary | ICD-10-CM | POA: Diagnosis not present

## 2024-08-27 DIAGNOSIS — H4052X4 Glaucoma secondary to other eye disorders, left eye, indeterminate stage: Secondary | ICD-10-CM | POA: Diagnosis not present

## 2024-08-28 DIAGNOSIS — M25511 Pain in right shoulder: Secondary | ICD-10-CM | POA: Diagnosis not present

## 2024-08-28 DIAGNOSIS — Z96611 Presence of right artificial shoulder joint: Secondary | ICD-10-CM | POA: Diagnosis not present

## 2024-08-31 ENCOUNTER — Other Ambulatory Visit: Payer: Self-pay

## 2024-08-31 DIAGNOSIS — Z96611 Presence of right artificial shoulder joint: Secondary | ICD-10-CM | POA: Diagnosis not present

## 2024-09-01 DIAGNOSIS — M25511 Pain in right shoulder: Secondary | ICD-10-CM | POA: Diagnosis not present

## 2024-09-01 DIAGNOSIS — Z96611 Presence of right artificial shoulder joint: Secondary | ICD-10-CM | POA: Diagnosis not present

## 2024-09-04 DIAGNOSIS — M25511 Pain in right shoulder: Secondary | ICD-10-CM | POA: Diagnosis not present

## 2024-09-04 DIAGNOSIS — Z96611 Presence of right artificial shoulder joint: Secondary | ICD-10-CM | POA: Diagnosis not present

## 2024-09-09 ENCOUNTER — Other Ambulatory Visit: Payer: Self-pay

## 2024-09-09 MED ORDER — OMEPRAZOLE 40 MG PO CPDR
40.0000 mg | DELAYED_RELEASE_CAPSULE | Freq: Every day | ORAL | 2 refills | Status: AC
  Filled 2024-09-09: qty 90, 90d supply, fill #0

## 2024-09-09 MED ORDER — HYDROCODONE-ACETAMINOPHEN 5-325 MG PO TABS
1.0000 | ORAL_TABLET | ORAL | 0 refills | Status: DC | PRN
  Filled 2024-09-09: qty 20, 2d supply, fill #0

## 2024-09-09 MED ORDER — VERAPAMIL HCL ER 360 MG PO CP24
360.0000 mg | ORAL_CAPSULE | Freq: Every day | ORAL | 0 refills | Status: DC
  Filled 2024-09-09: qty 60, 60d supply, fill #0
  Filled 2024-09-09: qty 30, 30d supply, fill #0

## 2024-09-10 ENCOUNTER — Other Ambulatory Visit: Payer: Self-pay

## 2024-09-15 ENCOUNTER — Other Ambulatory Visit: Payer: Self-pay

## 2024-09-15 DIAGNOSIS — M25511 Pain in right shoulder: Secondary | ICD-10-CM | POA: Diagnosis not present

## 2024-09-15 DIAGNOSIS — Z96611 Presence of right artificial shoulder joint: Secondary | ICD-10-CM | POA: Diagnosis not present

## 2024-09-15 MED ORDER — CARVEDILOL 6.25 MG PO TABS
6.2500 mg | ORAL_TABLET | Freq: Two times a day (BID) | ORAL | 1 refills | Status: DC
  Filled 2024-09-15: qty 180, 90d supply, fill #0

## 2024-09-21 ENCOUNTER — Other Ambulatory Visit: Payer: Self-pay

## 2024-09-21 DIAGNOSIS — Z96611 Presence of right artificial shoulder joint: Secondary | ICD-10-CM | POA: Diagnosis not present

## 2024-09-22 ENCOUNTER — Other Ambulatory Visit: Payer: Self-pay

## 2024-09-23 ENCOUNTER — Other Ambulatory Visit: Payer: Self-pay

## 2024-09-24 ENCOUNTER — Other Ambulatory Visit: Payer: Self-pay

## 2024-09-25 ENCOUNTER — Other Ambulatory Visit: Payer: Self-pay

## 2024-09-25 MED ORDER — HYDROCODONE-ACETAMINOPHEN 5-325 MG PO TABS
1.0000 | ORAL_TABLET | Freq: Two times a day (BID) | ORAL | 0 refills | Status: DC | PRN
  Filled 2024-09-25: qty 20, 10d supply, fill #0

## 2024-09-29 DIAGNOSIS — M25511 Pain in right shoulder: Secondary | ICD-10-CM | POA: Diagnosis not present

## 2024-09-29 DIAGNOSIS — Z96611 Presence of right artificial shoulder joint: Secondary | ICD-10-CM | POA: Diagnosis not present

## 2024-10-01 DIAGNOSIS — Z96611 Presence of right artificial shoulder joint: Secondary | ICD-10-CM | POA: Diagnosis not present

## 2024-10-01 DIAGNOSIS — M25611 Stiffness of right shoulder, not elsewhere classified: Secondary | ICD-10-CM | POA: Diagnosis not present

## 2024-10-04 ENCOUNTER — Other Ambulatory Visit: Payer: Self-pay

## 2024-10-08 ENCOUNTER — Other Ambulatory Visit (HOSPITAL_COMMUNITY): Payer: Self-pay

## 2024-10-09 DIAGNOSIS — M25511 Pain in right shoulder: Secondary | ICD-10-CM | POA: Diagnosis not present

## 2024-10-09 DIAGNOSIS — Z96611 Presence of right artificial shoulder joint: Secondary | ICD-10-CM | POA: Diagnosis not present

## 2024-10-12 ENCOUNTER — Other Ambulatory Visit: Payer: Self-pay

## 2024-10-12 MED ORDER — HYDROCODONE-ACETAMINOPHEN 5-325 MG PO TABS
1.0000 | ORAL_TABLET | Freq: Two times a day (BID) | ORAL | 0 refills | Status: DC | PRN
  Filled 2024-10-12: qty 20, 10d supply, fill #0

## 2024-10-14 ENCOUNTER — Other Ambulatory Visit: Payer: Self-pay | Admitting: Surgery

## 2024-10-19 ENCOUNTER — Telehealth (HOSPITAL_BASED_OUTPATIENT_CLINIC_OR_DEPARTMENT_OTHER): Payer: Self-pay

## 2024-10-19 ENCOUNTER — Telehealth (HOSPITAL_BASED_OUTPATIENT_CLINIC_OR_DEPARTMENT_OTHER): Payer: Self-pay | Admitting: *Deleted

## 2024-10-19 ENCOUNTER — Other Ambulatory Visit: Payer: Self-pay

## 2024-10-19 ENCOUNTER — Encounter
Admission: RE | Admit: 2024-10-19 | Discharge: 2024-10-19 | Disposition: A | Discharge status: Home / Self Care | Source: Ambulatory Visit | Attending: Surgery | Admitting: Surgery

## 2024-10-19 VITALS — Wt 209.0 lb

## 2024-10-19 DIAGNOSIS — E119 Type 2 diabetes mellitus without complications: Secondary | ICD-10-CM

## 2024-10-19 DIAGNOSIS — Z79899 Other long term (current) drug therapy: Secondary | ICD-10-CM

## 2024-10-19 DIAGNOSIS — M7581 Other shoulder lesions, right shoulder: Secondary | ICD-10-CM

## 2024-10-19 DIAGNOSIS — I10 Essential (primary) hypertension: Secondary | ICD-10-CM

## 2024-10-19 DIAGNOSIS — Z01812 Encounter for preprocedural laboratory examination: Secondary | ICD-10-CM

## 2024-10-19 HISTORY — DX: Adhesive capsulitis of right shoulder: M75.01

## 2024-10-19 NOTE — Telephone Encounter (Signed)
 Pt requesting a c/b to schedule tele visit.

## 2024-10-19 NOTE — Patient Instructions (Signed)
 Your procedure is scheduled on:10-21-24 Wednesday Report to the Registration Desk on the 1st floor of the [facility].Then proceed to the 2nd floor Surgery Desk To find out your arrival time, please call [phone] between 1PM - 3PM on:10-20-24 Tuesday If your arrival time is 6:00 am, do not arrive before that time as the [facility] entrance doors do not open until 6:00 am.  REMEMBER: Instructions that are not followed completely may result in serious medical risk, up to and including death; or upon the discretion of your surgeon and anesthesiologist your surgery may need to be rescheduled.  Do not eat food after midnight the night before surgery.  No gum chewing or hard candies.  You may however, drink Water up to 2 hours before you are scheduled to arrive for your surgery. Do not drink anything within 2 hours of your scheduled arrival time.  In addition, your doctor has ordered for you to drink the provided:  Gatorade G2 Drinking this carbohydrate drink up to two hours before surgery helps to reduce insulin  resistance and improve patient outcomes. Please complete drinking 2 hours before scheduled arrival time.  One week prior to surgery:Stop NOW (10-19-24) Stop Anti-inflammatories (NSAIDS) such as Advil, Aleve, Ibuprofen, Motrin, Naproxen, Naprosyn and Aspirin  based products such as Excedrin, Goody's Powder, BC Powder. Stop ANY OVER THE COUNTER supplements until after surgery.  You may however, continue to take Tylenol  if needed for pain up until the day of surgery.  Stop Ozempic  7 days prior to surgery-Do NOT take again until AFTER your surgery  Stop empagliflozin  (JARDIANCE ) 3 days prior to surgery-Stop NOW (10-19-24)  Continue taking all of your other prescription medications up until the day of surgery.  ON THE DAY OF SURGERY ONLY TAKE THESE MEDICATIONS WITH SIPS OF WATER: -carvedilol  (COREG )  -omeprazole  (PRILOSEC)   Continue your 81 mg Aspirin  up until the day prior to  surgery-Do NOT take the morning of surgery  Take half of your Tresiba  Insulin  (12 units) the night before surgery and NO Insulin  the morning of surgery  Bring your Albuterol  Inhaler to the hospital  No Alcohol for 24 hours before or after surgery.  No Smoking including e-cigarettes for 24 hours before surgery.  No chewable tobacco products for at least 6 hours before surgery.  No nicotine patches on the day of surgery.  Do not use any recreational drugs for at least a week (preferably 2 weeks) before your surgery.  Please be advised that the combination of cocaine and anesthesia may have negative outcomes, up to and including death. If you test positive for cocaine, your surgery will be cancelled.  On the morning of surgery brush your teeth with toothpaste and water, you may rinse your mouth with mouthwash if you wish. Do not swallow any toothpaste or mouthwash.  Do not wear jewelry, make-up, hairpins, clips or nail polish.  For welded (permanent) jewelry: bracelets, anklets, waist bands, etc.  Please have this removed prior to surgery.  If it is not removed, there is a chance that hospital personnel will need to cut it off on the day of surgery.  Do not wear lotions, powders, or perfumes.   Do not shave body hair from the neck down 48 hours before surgery.  Contact lenses, hearing aids and dentures may not be worn into surgery.  Do not bring valuables to the hospital. [facility] is not responsible for any missing/lost belongings or valuables.   Notify your doctor if there is any change in  your medical condition (cold, fever, infection).  Wear comfortable clothing (specific to your surgery type) to the hospital.  After surgery, you can help prevent lung complications by doing breathing exercises.  Take deep breaths and cough every 1-2 hours. Your doctor may order a device called an Incentive Spirometer to help you take deep breaths. When coughing or sneezing, hold a pillow  firmly against your incision with both hands. This is called "splinting." Doing this helps protect your incision. It also decreases belly discomfort.  If you are being admitted to the hospital overnight, leave your suitcase in the car. After surgery it may be brought to your room.  In case of increased patient census, it may be necessary for you, the patient, to continue your postoperative care in the Same Day Surgery department.  If you are being discharged the day of surgery, you will not be allowed to drive home. You will need a responsible individual to drive you home and stay with you for 24 hours after surgery.   If you are taking public transportation, you will need to have a responsible individual with you.  Please call the Pre-admissions Testing Dept. at [phone] if you have any questions about these instructions.  Surgery Visitation Policy:  Patients having surgery or a procedure may have two visitors.  Children under the age of 71 must have an adult with them who is not the patient.   Merchandiser, Retail to address health-related social needs:  [email or web address]

## 2024-10-19 NOTE — Telephone Encounter (Signed)
   Pre-operative Risk Assessment    Patient Name: Robert Fernandez  DOB: 06-07-1956 MRN: [id number]   Date of last office visit: 04/23/24 with Barnie Hila Date of next office visit: NA   Request for Surgical Clearance    Procedure:  Manipulation of joint, Shoulder  Date of Surgery:  Clearance 10/21/24                                Surgeon:  Dr. Norleen Maltos Surgeon's Group or Practice Name:  [facility] Phone number:  [phone] Fax number:  Dorise Pereyra, FNP-C states not required will follow up in [facility]   Type of Clearance Requested:   - Medical  - Pharmacy:  Hold Aspirin  stated NA- surgeon has cleared patient to continue their daily does of LOW DOSE ASPIRIN  throughout the perioperative course. Dose will be held on the day of surgery only.   Type of Anesthesia:  General    Additional requests/questions:    Signed, Robert Fernandez   10/19/2024, 8:08 AM

## 2024-10-19 NOTE — Progress Notes (Signed)
 Perioperative / Anesthesia Services  Pre-Admission Testing Clinical Review / Pre-Operative Anesthesia Consult  Date: 10/20/24  PATIENT DEMOGRAPHICS: Name: Robert Fernandez DOB: [birth year] MRN:   [id number]  Note: Available PAT nursing documentation and vital signs have been reviewed. Clinical nursing staff has updated patient's PMH/PSHx, current medication list, and drug allergies/intolerances to ensure complete and comprehensive history available to assist care teams in MDM as it pertains to the aforementioned surgical procedure and anticipated anesthetic course. Extensive review of available clinical information personally performed. Nursing documentation reviewed. [place] PMH and PSHx updated with any diagnoses and/or procedures that I have knowledge of that may have been inadvertently omitted during his intake with the pre-admission testing department's nursing staff.  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 male who is submitted for pre-surgical anesthesia review and clearance prior to him undergoing the above procedure.  Patient is a Former Games Developer. Pertinent PMH includes: CAD (s/p CABG), diastolic dysfunction, TIA, aortic atherosclerosis, HTN, HLD, T2DM, DOE, asthma, OSAH (unable to tolerate nocturnal PAP therapy), GERD (on daily PPI), hiatal hernia, cirrhosis, hepatosplenomegaly, OA, cervical spondylosis with radiculopathy, osteonecrosis of the  RIGHT humeral head, RIGHT rotator cuff tear, neovascular glaucoma, lumbar DDD.   Patient is followed by cardiology Thera, MD). He was last seen in the cardiology clinic on 04/23/2024; notes reviewed. At the time of his clinic visit, patient.  Patient reported a 2 to 25-month history of unexplained vertiginous symptoms associated with position changes.  Patient also complained of exertional dyspnea and discomfort in the left side of his chest.  Cardiac symptoms are self-limiting and lasts approximately 2 minutes; unrelated to exertion.  Patient experiences edema in his BILATERAL lower extremities that he attributed to issues with his spine.  He denied any PND, orthopnea, palpitations, weakness, fatigue, or presyncope/syncope. Patient with a past medical history significant for cardiovascular diagnoses. Documented physical exam was grossly benign, providing no evidence of acute exacerbation and/or decompensation of the patient's known cardiovascular conditions.  Of note, complete records regarding patient's cardiovascular history unavailable for review at time of consult.  Information gathered from patient report and from notes provided by his specialty care providers.   Most recent myocardial perfusion imaging study was performed on 08/02/2015 revealing a  normal left ventricular systolic function with a hyperdynamic LVEF of 69%. There were no regional wall motion abnormalities.  No artifact or left ventricular cavity size enlargement appreciated on review of imaging. SPECT images demonstrated no evidence of stress-induced myocardial ischemia or arrhythmia; no scintigraphic evidence of scar.  TID ratio = 1.08. Study determined to be normal and low risk.   Patient underwent diagnostic LEFT heart catheterization on 05/23/2016 at [facility] with Dr. Dallas Quivers, DO.  Invasive coronary testing revealed a 99% occlusion of the left main with no associated distal disease.  He was referred to CVTS for  consideration of revascularization.   Patient underwent two-vessel revascularization  procedure on 05/25/2016.  LIMA-LAD and SVG-LCx bypass grafts were placed.  Procedure was complicated by development of MSSA bacteremia and a significant MRSE sternal wound infection.  Patient had an extended hospital admission, however was ultimately discharged in stable condition for outpatient follow-up with cardiology.   Patient presented with complaints of unilateral facial drooping and slurred speech on 11/06/2018.  Imaging of the brain was negative for acute intracranial abnormality and LAO.  Patient diagnosed with TIA.  Patient with no residual deficits, symptoms following this neurological event.   Most recent TTE performed on 01/09/2024 revealed a normal left ventricular systolic function with an EF of 55-60%. There were no regional wall motion abnormalities. Left ventricular diastolic Doppler parameters consistent with abnormal relaxation (G1DD). Right ventricular size and function normal with a TAPSE measuring 1.4 cm  (normal range >/= 1.6 cm). There was no significant valvular regurgitation.  All transvalvular gradients were noted to be normal providing no evidence of hemodynamically significant valvular stenosis. Aorta normal in size with no evidence of ectasia or aneurysmal dilatation.   Patient with a history of mild BILATERAL carotid artery disease.  Most recent carotid Doppler study performed on 05/25/2024 revealed a 1-39% stenosis of the patient's BILATERAL internal carotid arteries.  Vertebrals demonstrated antegrade flow.  Normal flow hemodynamics seen in the subclavian arteries.   BBlood pressure well controlled at 118/52 mmHg on currently prescribed CCB (verapamil ), beta-blocker (carvedilol ) and diuretic (spironolactone  + furosemide ) therapies.  Patient is on PCSK9i (evolocumab ) for his HLD diagnosis and ASCVD prevention. T2DM poorly controlled when patient was last seen by his cardiologist; A1c 8.5%  when checked on 04/06/2024.  Of note, since patient was seen by his cardiologist, A1c has been rechecked with further worsening to 9.4% on 07/13/2024. In the setting of known cardiovascular diagnoses and concurrent T2DM, patient is taking an SGLT2i (empagliflozin ) for added cardiovascular and renovascular protection.  Patient does have an OSAH diagnosis, however due to his neovascular glaucoma (NVG), patient is unable to tolerate his prescribed nocturnal PAP therapy. Functional capacity somewhat limited by patient's age and multiple medical comorbidities.  Additionally, as previously reported, patient having unexplained vertiginous symptoms with position changes.  With that said, patient able to complete all of his ADLs/IADLs without significant cardiovascular limitation.  Per the DASI, patient is able to exceed 4 METS of physical activity without experiencing any significant bruits of angina/anginal equivalent symptoms.  Persistent symptoms felt to be medication induced; beta-blocker (carvedilol ) dose decreased.  Given patient's complaints of intermittent chest discomfort, patient was provided with a prescription for short acting nitrate therapy (NTG) to use on a as needed basis for recurrent angina/anginal equivalent symptoms.  No other changes were made to his medication regimen.  Patient follow-up with outpatient cardiology in 3 months or sooner if needed.   Norleen VEAR Keel is scheduled for an elective MANIPULATION, JOINT, SHOULDER, WITH ANESTHESIA (Right: Shoulder) on 10/21/2024 with Dr. Norleen JINNY Maltos, MD. Given patient's past medical history significant for cardiovascular diagnoses, presurgical cardiac clearance was sought by the PAT team. Per cardiology, according to the Revised Cardiac Risk Index (RCRI), his Perioperative Risk of Major Cardiac Event is (%): 6.6. His Functional Capacity in METs is: 5.07 according to the [facility] Activity Status Index (DASI). Therefore, based on ACC/AHA guidelines, patient would  be at ACCEPTABLE risk for the planned procedure without further cardiovascular testing.  In review of the patient's chart, it is noted that he is on daily oral antithrombotic therapy. Given that patient's past medical history is significant  for cardiovascular diagnoses, including but not limited to CAD, orthopedics has cleared patient to continue his daily low dose ASA throughout his perioperative course.  Patient has been updated on these directives from his specialty care providers by the PAT team.   Patient reports previous perioperative complications with anesthesia in the past. Patient has a PMH (+) for PONV. Symptoms and history of PONV will be discussed with patient by anesthesia team on the day of her procedure. Interventions will be ordered as deemed necessary based on patient's individual care needs as determined by anesthesiologist. In review his EMR, it is noted that patient underwent a general anesthetic course here at [facility] ([facility]) in 06/2024 without documented complications.     MOST RECENT VITAL SIGNS:    10/19/2024    1:00 PM 08/04/2024    6:52 PM 08/04/2024    6:51 PM  Vitals with BMI  Height  6' 0   Weight 209 lbs 200 lbs   BMI  27.12   Systolic   119  Diastolic   74  Pulse   108   PROVIDERS/SPECIALISTS: NOTE: Primary physician provider listed below. Patient may have been seen by APP or partner within same practice.   PROVIDER ROLE / SPECIALTY LAST OV  Poggi, Norleen PARAS, MD Orthopedics (Surgeon) 10/12/2024  Miriam Rocky Shams, MD Primary Care Provider 08/27/2024  Darliss Rogue, MD Cardiology 04/23/2024; preop APP call 10/20/2024  Marcelino Nurse, MD Pain Management 06/25/2024  Ridenhour, Alan, NP-C Endocrinology 08/21/2024   ALLERGIES: Allergies  Allergen Reactions   Rosuvastatin Other (See Comments)    Cramps   Augmentin [Amoxicillin-Pot Clavulanate]     Hypotension    Codeine Other (See Comments)    Altered mental  state   Dilaudid [Hydromorphone Hcl] Other (See Comments)    Hallucinations    Oxycodone -Acetaminophen  Other (See Comments)    Other Reaction(s): Unknown    CURRENT HOME MEDICATIONS: No current facility-administered medications for this encounter.    acetaminophen  (TYLENOL ) 500 MG tablet   albuterol  (VENTOLIN  HFA) 108 (90 Base) MCG/ACT inhaler   aspirin  EC 81 MG tablet   brimonidine  (ALPHAGAN ) 0.2 % ophthalmic solution   carvedilol  (COREG ) 3.125 MG tablet   empagliflozin  (JARDIANCE ) 25 MG TABS tablet   Evolocumab  (REPATHA  SURECLICK) 140 MG/ML SOAJ   furosemide  (LASIX ) 20 MG tablet   gabapentin  (NEURONTIN ) 300 MG capsule   HYDROcodone -acetaminophen  (NORCO/VICODIN) 5-325 MG tablet   Insulin  Degludec FlexTouch 100 UNIT/ML SOPN   insulin  lispro (HUMALOG ) 100 UNIT/ML KwikPen   meclizine  (ANTIVERT ) 25 MG tablet   nitroGLYCERIN  (NITROSTAT ) 0.4 MG SL tablet   omeprazole  (PRILOSEC) 40 MG capsule   ondansetron  (ZOFRAN ) 4 MG tablet   ondansetron  (ZOFRAN -ODT) 4 MG disintegrating tablet   pioglitazone  (ACTOS ) 30 MG tablet   Povidone, PF, (IVIZIA DRY EYES) 0.5 % SOLN   Semaglutide , 1 MG/DOSE, (OZEMPIC , 1 MG/DOSE,) 4 MG/3ML SOPN   spironolactone  (ALDACTONE ) 50 MG tablet   SUMAtriptan  (IMITREX ) 50 MG tablet   tiZANidine  (ZANAFLEX ) 4 MG capsule   verapamil  (VERELAN ) 360 MG 24 hr capsule   Blood Glucose Monitoring Suppl (FREESTYLE FREEDOM LITE) w/Device KIT   Continuous Blood Gluc Receiver (FREESTYLE LIBRE 14 DAY READER) DEVI   Continuous Glucose Receiver (FREESTYLE LIBRE 3 READER) DEVI   Continuous Glucose Sensor (FREESTYLE LIBRE 14 DAY SENSOR) MISC   Continuous Glucose Sensor (FREESTYLE LIBRE 3 PLUS SENSOR) MISC   glucose blood (PRECISION QID TEST) test strip   Insulin  Pen Needle (AUM MINI INSULIN  PEN  NEEDLE) 32G X 6 MM MISC   Insulin  Pen Needle (UNIFINE PENTIPS PLUS) 31G X 6 MM MISC   Lancets 33G MISC   HISTORY: Past Medical History:  Diagnosis Date   Adhesive capsulitis of right  shoulder    Aortic atherosclerosis    Arthritis    Asthma    Bacteremia due to methicillin susceptible Staphylococcus aureus (MSSA) 05/2016   a.) s/p CABG   Bilateral occipital neuralgia 05/20/2024   Cervical spondylosis with radiculopathy    Cirrhosis of liver (HCC)    Coronary artery disease    DDD (degenerative disc disease), lumbar    Diabetic retinopathy of both eyes (HCC)    Diastolic dysfunction    Dysfunction of Eustachian tube, bilateral 09/29/2020   Erectile dysfunction 08/13/2019   GERD (gastroesophageal reflux disease)    Hepatosplenomegaly    Hiatal hernia    History of deep venous thrombosis    Hyperlipidemia    Hypertension    Left adrenal mass    Long-term use of aspirin  therapy    Meningioma (HCC)    Methicillin resistant Staphylococcus epidermidis infection 05/2016   a.) sternal wound infection s/p CABG   Migraines    Neovascular glaucoma, indeterminate stage, left    Neuropathy    Nocturnal leg cramps 09/29/2020   Nontraumatic complete tear of right rotator cuff    Obstructive sleep apnea    a.) unable to tolerate nocturnal PAP therapy d/t eye condition   Osteoarthritis    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   Past Surgical History:  Procedure Laterality Date   ACHILLES TENDON REPAIR     BLEPHAROPLASTY UPPER EYELID      CORONARY ARTERY BYPASS GRAFT N/A 05/25/2016   FINGER AMPUTATION Left    INSERTION AQUEOUS SHUNT      KNEE  ARTHROSCOPY Left    LAPAROSCOPIC CHOLECYSTECTOMY     LEFT HEART CATH AND CORONARY ANGIOGRAPHY Left 05/24/2016   LENS EYE SURGERY      LUMBAR DISC SURGERY     l4-5   REVERSE SHOULDER ARTHROPLASTY Right 07/21/2024   Procedure: ARTHROPLASTY, SHOULDER, TOTAL, REVERSE;  Surgeon: Edie Norleen PARAS, MD;  Location: [facility] ORS;  Service: Orthopedics;  Laterality: Right;   ROTATOR CUFF REPAIR Left    SHOULDER ARTHROSCOPY WITH SUBACROMIAL DECOMPRESSION, ROTATOR CUFF REPAIR AND BICEP TENDON REPAIR Right 12/17/2023   Procedure: RIGHT SHOULDER ARTHROSCOPY WITH DEBRIDEMENT, DECOMPRESSION, ROTATOR CUFF REPAIR, BICEPS TENODESIS, MANIPULATION UNDER ANESTHESIA.;  Surgeon: Edie Norleen PARAS, MD;  Location: [facility] ORS;  Service: Orthopedics;  Laterality: Right;   STERIOD INJECTION Right 07/21/2024   Procedure: STEROID INJECTION ELBOW;  Surgeon: Edie Norleen PARAS, MD;  Location: [facility] ORS;  Service: Orthopedics;  Laterality: Right;   TIBIA FRACTURE SURGERY     Family History  Problem Relation Age of Onset   Breast cancer Mother    Heart attack Father    Hypertension Father    Breast cancer Sister    Heart  disease Maternal Grandmother    Social History   Tobacco Use   Smoking status: Former    Types: Cigarettes   Smokeless tobacco: Never  Substance Use Topics   Alcohol use: Never   LABS:  Lab Results  Component Value Date   WBC 13.7 (H) 08/04/2024   HGB 14.1 08/04/2024   HCT 42.1 08/04/2024   MCV 91.1 08/04/2024   PLT 355 08/04/2024   Lab Results  Component Value Date   NA 136 10/20/2024   CL 98 10/20/2024   K 3.9 10/20/2024   CO2 29 10/20/2024   BUN 17 10/20/2024   CREATININE 0.80 10/20/2024   GFRNONAA >60 10/20/2024   CALCIUM 9.1 10/20/2024   ALBUMIN 3.8 07/13/2024   GLUCOSE 181 (H) 10/20/2024    ECG: Date: 08/04/2024  Time ECG obtained: 1853 PM Rate: 109 bpm Rhythm: sinus tachycardia Axis (leads I and aVF): normal Intervals: PR 136 ms. QRS 90 ms. QTc 430 ms. ST segment and T wave changes:  Nonspecific inferior T wave abnormality Evidence of a possible, age undetermined, prior infarct:  No Comparison: Similar to previous tracing obtained on 04/23/2024   IMAGING / PROCEDURES: MR BRAIN W WO CONTRAST performed on 08/14/2024 No acute intracranial abnormality. 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 Punctate foci of increased T2 signal within the cerebral white matter, again demonstrated.  VAS US  CAROTID performed on 05/25/2024 Velocities in the right ICA are consistent with a 1-39% stenosis. Non-hemodynamically significant plaque <50% noted in the CCA.  Velocities in the left ICA are consistent with a 1-39% stenosis. Non-hemodynamically significant plaque <50% noted in the CCA.  Bilateral vertebral arteries demonstrate antegrade flow.  Normal flow hemodynamics were seen in bilateral subclavian arteries.    TRANSTHORACIC ECHOCARDIOGRAM performed on 01/09/2024 Left ventricular ejection fraction, by estimation, is 55 to 60%. The left ventricle has normal function. The left ventricle has no regional wall motion abnormalities. Left ventricular diastolic parameters are consistent with Grade I diastolic dysfunction (impaired relaxation).  Right ventricular systolic function is normal. The right ventricular size is normal.  The mitral valve is normal in structure. No evidence of mitral valve regurgitation. No evidence of mitral stenosis.  The aortic valve is normal in structure. Aortic valve regurgitation is not visualized. No aortic stenosis is present.  The inferior vena cava is normal in size with greater than 50% respiratory variability, suggesting right atrial pressure of 3 mmHg.    MR SHOULDER RIGHT WO CONTRAST performed on 05/01/2024 Status post anterior supraspinatus rotator cuff repair. The anterior supraspinatus tendon footprint tear is markedly improved. There may be a thin  horizontal linear 4 mm partial-thickness residual articular sided tear at the mid to posterior supraspinatus tendon, versus artifact. This is small and of likely low clinical significance. The prior anterior high-grade partial-thickness and full-thickness tear appears resolved/intact. Mild-to-moderate supraspinatus muscle atrophy is unchanged. Interval acromioplasty, now with type II acromion and resolution of the prior anterolateral downsloping of the acromion. The proximal long head of the biceps tendon is no longer well visualized proximal to the bicipital groove. Question interval biceps tenotomy versus tenodesis. 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 No overlying cortical collapse.   CT ABDOMEN PELVIS WO CONTRAST performed on 02/07/2024 Punctate nonobstructing right renal stone. No hydronephrosis or renal inflammation. Cirrhotic hepatic morphology  Small hiatal hernia. Faint tree-in-bud nodular opacities in both lower lobes, likely infectious or inflammatory. Aortic atherosclerosis  IMPRESSION AND PLAN: KEIRON IODICE has been referred for pre-anesthesia review and clearance prior to him undergoing the planned anesthetic and procedural courses. Available labs, pertinent testing, and imaging results were personally reviewed by me in preparation for upcoming operative/procedural course. [facility] medical record has been updated following extensive record review and patient interview with PAT staff.   This patient has been appropriately cleared by cardiology with an overall ACCEPTABLE risk of patient experiencing significant perioperative cardiovascular complications. Based on clinical review performed today (10/20/24), barring any significant acute changes in the patient's overall condition, it is anticipated that he will be able to proceed with the planned surgical intervention. Any  acute changes in clinical condition may necessitate his procedure being postponed and/or cancelled. Patient will meet with anesthesia team (MD and/or CRNA) on the day of his procedure for preoperative evaluation/assessment. Questions regarding anesthetic course will be fielded at that time.   Pre-surgical instructions were reviewed with the patient during his PAT appointment, and questions were fielded to satisfaction by PAT clinical staff. He has been instructed on which medications that he will need to hold prior to surgery, as well as the ones that have been deemed safe/appropriate to take on the day of his procedure. As part of the general education provided by PAT, patient made aware both verbally and in writing, that he would need to abstain from the use of any illegal substances during his perioperative course. He was advised that failure to follow the provided instructions could necessitate case cancellation or result in serious perioperative complications up to and including death. Patient encouraged to contact PAT and/or his surgeon's office to discuss any questions or concerns that may arise prior to surgery; verbalized understanding.   Dorise Pereyra, MSN, APRN, FNP-C, CEN [facility]  Perioperative Services Nurse Practitioner Phone: [phone] Fax: [phone] 10/20/24 10:55 AM  NOTE: This note has been prepared using Dragon dictation software. Despite my best ability to proofread, there is always the potential that unintentional transcriptional errors may still occur from this process.

## 2024-10-19 NOTE — Telephone Encounter (Signed)
 Left message to call back as we will need to schedule a tele preop appt tomorrow as an add on ok per Lum FALCON. NP.

## 2024-10-19 NOTE — Telephone Encounter (Signed)
 Our office just received a clearance request today for preop clearance for procedure 10/21/24. Pt needs a tele preop appt per preop APP.  We have no tele preop appt's today. Will confirm with preop APP Lum FALCON. NP if we are adding pt on tomorrow.

## 2024-10-19 NOTE — Telephone Encounter (Signed)
 Pt has been scheduled tele preop appt 10/20/24. Med rec and consent are done.

## 2024-10-19 NOTE — Telephone Encounter (Signed)
   Name: Robert Fernandez  DOB: 06/23/1956  MRN: [id number]  Primary Cardiologist: Redell Cave, MD   Preoperative team, please contact this patient and set up a phone call appointment for further preoperative risk assessment. Please obtain consent and complete medication review. Thank you for your help.  I confirm that guidance regarding antiplatelet and oral anticoagulation therapy has been completed and, if necessary, noted below.  Hold Aspirin  stated NA- surgeon has cleared patient to continue their daily does of LOW DOSE ASPIRIN  throughout the perioperative course. Dose will be held on the day of surgery only.   I also confirmed the patient resides in the state of [place] . As per [facility] Medical Board telemedicine laws, the patient must reside in the state in which the provider is licensed.   Lum LITTIE Louis, NP 10/19/2024, 8:44 AM [facility]

## 2024-10-19 NOTE — Telephone Encounter (Signed)
 Pt has been scheduled tele preop appt 10/20/24. Med rec and consent are done.     Patient Consent for Virtual Visit        Robert Fernandez has provided verbal consent on 10/19/2024 for a virtual visit (video or telephone).   CONSENT FOR VIRTUAL VISIT FOR:  Robert Fernandez  By participating in this virtual visit I agree to the following:  I hereby voluntarily request, consent and authorize [facility] and its employed or contracted physicians, physician assistants, nurse practitioners or other licensed health care professionals (the Practitioner), to provide me with telemedicine health care services (the "Services) as deemed necessary by the treating Practitioner. I acknowledge and consent to receive the Services by the Practitioner via telemedicine. I understand that the telemedicine visit will involve communicating with the Practitioner through live audiovisual communication technology and the disclosure of certain medical information by electronic transmission. I acknowledge that I have been given the opportunity to request an in-person assessment or other available alternative prior to the telemedicine visit and am voluntarily participating in the telemedicine visit.  I understand that I have the right to withhold or withdraw my consent to the use of telemedicine in the course of my care at any time, without affecting my right to future care or treatment, and that the Practitioner or I may terminate the telemedicine visit at any time. I understand that I have the right to inspect all information obtained and/or recorded in the course of the telemedicine visit and may receive copies of available information for a reasonable fee.  I understand that some of the potential risks of receiving the Services via telemedicine include:  Delay or interruption in medical evaluation due to technological equipment failure or disruption; Information transmitted may not be sufficient (e.g. poor  resolution of images) to allow for appropriate medical decision making by the Practitioner; and/or  In rare instances, security protocols could fail, causing a breach of personal health information.  Furthermore, I acknowledge that it is my responsibility to provide information about my medical history, conditions and care that is complete and accurate to the best of my ability. I acknowledge that Practitioner's advice, recommendations, and/or decision may be based on factors not within their control, such as incomplete or inaccurate data provided by me or distortions of diagnostic images or specimens that may result from electronic transmissions. I understand that the practice of medicine is not an exact science and that Practitioner makes no warranties or guarantees regarding treatment outcomes. I acknowledge that a copy of this consent can be made available to me via my patient portal [facility] MyChart), or I can request a printed copy by calling the office of [facility].    I understand that my insurance will be billed for this visit.   I have read or had this consent read to me. I understand the contents of this consent, which adequately explains the benefits and risks of the Services being provided via telemedicine.  I have been provided ample opportunity to ask questions regarding this consent and the Services and have had my questions answered to my satisfaction. I give my informed consent for the services to be provided through the use of telemedicine in my medical care

## 2024-10-20 ENCOUNTER — Ambulatory Visit: Attending: Internal Medicine | Admitting: Emergency Medicine

## 2024-10-20 ENCOUNTER — Encounter
Admission: RE | Admit: 2024-10-20 | Discharge: 2024-10-20 | Disposition: A | Discharge status: Home / Self Care | Source: Ambulatory Visit | Attending: Surgery | Admitting: Surgery

## 2024-10-20 DIAGNOSIS — Z01812 Encounter for preprocedural laboratory examination: Secondary | ICD-10-CM | POA: Diagnosis not present

## 2024-10-20 DIAGNOSIS — Z01810 Encounter for preprocedural cardiovascular examination: Secondary | ICD-10-CM | POA: Diagnosis not present

## 2024-10-20 DIAGNOSIS — I10 Essential (primary) hypertension: Secondary | ICD-10-CM | POA: Insufficient documentation

## 2024-10-20 DIAGNOSIS — Z79899 Other long term (current) drug therapy: Secondary | ICD-10-CM | POA: Insufficient documentation

## 2024-10-20 DIAGNOSIS — E119 Type 2 diabetes mellitus without complications: Secondary | ICD-10-CM | POA: Diagnosis not present

## 2024-10-20 LAB — BASIC METABOLIC PANEL WITH GFR
Anion gap: 9 (ref 5–15)
BUN: 17 mg/dL (ref 8–23)
CO2: 29 mmol/L (ref 22–32)
Calcium: 9.1 mg/dL (ref 8.9–10.3)
Chloride: 98 mmol/L (ref 98–111)
Creatinine, Ser: 0.8 mg/dL (ref 0.61–1.24)
GFR, Estimated: >60 mL/min (ref >60)
Glucose, Bld: 181 mg/dL — ABNORMAL HIGH (ref 70–99)
Potassium: 3.9 mmol/L (ref 3.5–5.1)
Sodium: 136 mmol/L (ref 135–145)

## 2024-10-20 NOTE — Progress Notes (Signed)
 Virtual Visit via Telephone Note   Because of Robert Fernandez co-morbid illnesses, he is at least at moderate risk for complications without adequate follow up.  This format is felt to be most appropriate for this patient at this time.  Due to technical limitations with video connection (technology), today's appointment will be conducted as an audio only telehealth visit, and Robert Fernandez verbally agreed to proceed in this manner.   All issues noted in this document were discussed and addressed.  No physical exam could be performed with this format.  Evaluation Performed:  Preoperative cardiovascular risk assessment _____________   Date:  10/20/2024   Patient ID:  Robert Fernandez, Robert Fernandez [birth year], MRN [id number] Patient Location:  Home Provider location:   Office  Primary Care Provider:  Miriam Rocky Shams, MD Primary Cardiologist:  Robert Cave, MD  Chief Complaint / Patient Profile   68 y.o. y/o male with a h/o coronary artery disease, hypertension, hyperlipidemia, obstructive sleep apnea, cirrhosis, T2DM, DVT who is pending manipulation of joint, shoulder on 10/21/2024 with [facility] and presents today for telephonic preoperative cardiovascular risk assessment.  History of Present Illness    Robert Fernandez is a 68 y.o. male who presents via audio/video conferencing for a telehealth visit today.  Pt was last seen in cardiology clinic on 04/23/2024 by Barnie, NP.  At that time Robert Fernandez was was having some dizziness and his carvedilol  was decreased.  He was also seen for telephone visit on 07/16/2024.  At that time he was doing well without acute cardiovascular concerns.  He was cleared to proceed with right total shoulder arthroplasty.  The patient is now pending procedure as outlined above.   Since his last visit, he denies chest pain, shortness of breath, lower extremity edema, fatigue, palpitations, melena, hematuria, hemoptysis, diaphoresis, weakness,  presyncope, syncope, orthopnea, and PND.  Today patient is doing well without acute cardiovascular concerns.  Tells me his shoulder surgery back in July went well and had no cardiac issues with the procedure.  He tells me his activity is somewhat limited due to his shoulder pain.  She had to go fishing couple weeks ago and was unable to cast his line due to shoulder pain.  Otherwise he does stay active within the home without any exertional symptoms.  Overall no symptoms to suggest active angina.  He is able to complete greater than 4 METS.  Of note patient's most recent LDL was 116 on 08/2024.  He tells me he has missed several doses of Repatha .  Have educated him to begin Repatha  back today which he understands and agrees.  I will have him follow-up in our office with Barnie.  Past Medical History    Past Medical History:  Diagnosis Date   Adhesive capsulitis of right shoulder    Aortic atherosclerosis    Arthritis    Asthma    Bacteremia due to methicillin susceptible Staphylococcus aureus (MSSA) 05/2016   a.) s/p CABG   Bilateral occipital neuralgia 05/20/2024   Cervical spondylosis with radiculopathy    Cirrhosis of liver (HCC)    Coronary artery disease    DDD (degenerative disc disease), lumbar    Diabetic retinopathy of both eyes (HCC)    Diastolic dysfunction    Dysfunction of Eustachian tube, bilateral 09/29/2020   Erectile dysfunction 08/13/2019   GERD (gastroesophageal reflux disease)    Hepatosplenomegaly    Hiatal hernia    History of deep venous thrombosis  Hyperlipidemia    Hypertension    Left adrenal mass    Long-term use of aspirin  therapy    Meningioma (HCC)    Methicillin resistant Staphylococcus epidermidis infection 05/2016   a.) sternal wound infection s/p CABG   Migraines    Neovascular glaucoma, indeterminate stage, left    Neuropathy    Nocturnal leg cramps 09/29/2020   Nontraumatic complete tear of right rotator cuff    Obstructive sleep apnea     a.) unable to tolerate nocturnal PAP therapy d/t eye condition   Osteoarthritis    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   Past Surgical History:  Procedure Laterality Date   ACHILLES TENDON REPAIR     BLEPHAROPLASTY UPPER EYELID      CORONARY ARTERY BYPASS GRAFT N/A 05/25/2016   FINGER AMPUTATION Left    INSERTION AQUEOUS SHUNT      KNEE ARTHROSCOPY Left    LAPAROSCOPIC CHOLECYSTECTOMY     LEFT HEART CATH AND CORONARY ANGIOGRAPHY Left 05/24/2016   LENS EYE SURGERY      LUMBAR DISC SURGERY     l4-5   REVERSE SHOULDER ARTHROPLASTY Right 07/21/2024   Procedure: ARTHROPLASTY, SHOULDER, TOTAL, REVERSE;  Surgeon: Edie Norleen PARAS, MD;  Location: [facility];  Service: Orthopedics;  Laterality: Right;   ROTATOR CUFF REPAIR Left    SHOULDER ARTHROSCOPY WITH SUBACROMIAL DECOMPRESSION, ROTATOR CUFF REPAIR AND BICEP TENDON REPAIR Right 12/17/2023   Procedure: RIGHT SHOULDER ARTHROSCOPY WITH DEBRIDEMENT, DECOMPRESSION, ROTATOR CUFF REPAIR, BICEPS TENODESIS, MANIPULATION UNDER ANESTHESIA.;  Surgeon: Edie Norleen PARAS, MD;  Location: [facility];  Service: Orthopedics;  Laterality: Right;   STERIOD INJECTION Right 07/21/2024   Procedure: STEROID INJECTION ELBOW;  Surgeon: Edie Norleen PARAS, MD;  Location: [facility];  Service: Orthopedics;  Laterality: Right;   TIBIA FRACTURE SURGERY      Allergies  Allergies  Allergen Reactions   Rosuvastatin Other (See Comments)    Cramps   Augmentin  [Amoxicillin-Pot Clavulanate]     Hypotension    Codeine Other (See Comments)    Altered mental state   Dilaudid [Hydromorphone Hcl] Other (See Comments)    Hallucinations    Oxycodone -Acetaminophen  Other (See Comments)    Other Reaction(s): Unknown    Home Medications    Prior to Admission medications   Medication Sig Start Date End Date Taking? Authorizing Provider  acetaminophen  (TYLENOL ) 500 MG tablet Take 500 mg by mouth every 6 (six) hours as needed for moderate pain (pain score 4-6).    [provider]  albuterol  (VENTOLIN  HFA) 108 (90 Base) MCG/ACT inhaler Inhale 2 puffs into the lungs every 4 (four) hours as needed. 02/04/24     aspirin  EC 81 MG tablet Take 81 mg by mouth daily. 06/22/19   [provider]  Blood Glucose Monitoring Suppl (FREESTYLE FREEDOM LITE) w/Device KIT Use as directed to check blood 3 times a day. 07/20/24     brimonidine  (ALPHAGAN ) 0.2 % ophthalmic solution  Place 1 drop into both eyes 2 (two) times daily. Patient taking differently: Place 1 drop into the left eye 2 (two) times daily. 11/28/23     carvedilol  (COREG ) 3.125 MG tablet Take 1 tablet (3.125 mg total) by mouth 2 (two) times daily with a meal. 04/23/24 04/18/25  Loistine Sober, NP  Continuous Blood Gluc Receiver (FREESTYLE LIBRE 14 DAY READER) DEVI Use as directed 02/14/23     Continuous Glucose Receiver (FREESTYLE LIBRE 3 READER) DEVI Use 1 each as directed Check blood sugars 4 times daily. 07/20/24     Continuous Glucose Sensor (FREESTYLE LIBRE 14 DAY SENSOR) MISC for glucose monitoring 02/14/23     Continuous Glucose Sensor (FREESTYLE LIBRE 3 PLUS SENSOR) MISC Use 1 each every 15 (fifteen) days 07/20/24     empagliflozin  (JARDIANCE ) 25 MG TABS tablet Take 1 tablet (25 mg total) by mouth daily with breakfast. 02/12/24     Evolocumab  (REPATHA  SURECLICK) 140 MG/ML SOAJ Inject 140 mg under the skin every 14 (fourteen) days. 12/13/23   Darliss Rogue, MD  furosemide  (LASIX ) 20 MG tablet  Take 1 tablet (20 mg total) by mouth daily. 11/13/23     gabapentin  (NEURONTIN ) 300 MG capsule Take 1 capsule (300 mg total) by mouth 2 (two) times daily AND 3 capsules (900 mg total) at bedtime. Patient taking differently: Take 300 mg the morning 1200 mg at bedtime 10/03/23     glucose blood (PRECISION QID TEST) test strip Use as directed to check blood 3 times a day. 07/20/24     HYDROcodone -acetaminophen  (NORCO/VICODIN) 5-325 MG tablet Take 1 tablet by mouth 2 (two) times daily as needed for Pain 10/12/24     Insulin  Degludec FlexTouch 100 UNIT/ML SOPN Inject 24 Units into the skin daily. Patient taking differently: Inject 24 Units into the skin at bedtime. 08/20/24     insulin  lispro (HUMALOG ) 100 UNIT/ML KwikPen Inject 3 Units subcutaneously 3 (three) times daily with meals Plus 2 for 50 above 150 correction. UP to 60 units daily. Patient taking differently: Inject 2-10 Units into the skin daily as needed (blood sugar of 250 or above). 07/20/24     Insulin  Pen Needle (AUM MINI INSULIN  PEN NEEDLE) 32G X 6 MM MISC Use 4 (four) times daily before meals and nightly 07/20/24     Insulin  Pen Needle (UNIFINE PENTIPS PLUS) 31G X 6 MM MISC use as directed 01/21/23     Lancets 33G MISC Use as directed to check blood 3 times a day. 07/20/24     meclizine  (ANTIVERT ) 25 MG tablet Take 1 tablet (25 mg total) by mouth 3 (three) times daily as needed for dizziness 09/03/23     nitroGLYCERIN  (NITROSTAT ) 0.4 MG SL tablet Place 1 tablet (0.4 mg total) under the tongue every 5 (five) minutes as needed for chest pain. Max is 3 doses per incident. 04/23/24 10/19/24  Loistine Sober, NP  omeprazole  (PRILOSEC) 40 MG capsule Take 1 capsule (40 mg total) by mouth daily. 09/09/24     ondansetron  (ZOFRAN ) 4 MG tablet Take 1 tablet (4 mg total) by mouth every 6 (six) hours as needed for nausea. 07/22/24   Kip Lynwood Double, PA-C  ondansetron  (ZOFRAN -ODT) 4 MG disintegrating tablet Take 1 tablet (4 mg total) by mouth every 8  (eight) hours as needed for nausea. 07/22/24     pioglitazone  (ACTOS ) 30 MG tablet Take 1 tablet (30 mg total) by mouth daily. 06/25/24     Povidone, PF, (IVIZIA DRY EYES) 0.5 % SOLN Place 1  drop into the right eye 2 (two) times daily.    [provider]  Semaglutide , 1 MG/DOSE, (OZEMPIC , 1 MG/DOSE,) 4 MG/3ML SOPN Inject 1 mg into the skin once a week. 08/21/24     spironolactone  (ALDACTONE ) 50 MG tablet Take 1 tablet (50 mg total) by mouth daily. 07/17/24     SUMAtriptan  (IMITREX ) 50 MG tablet Take 50 mg by mouth every 2 (two) hours as needed for migraine. May repeat in 2 hours if headache persists or recurs.    [provider]  tiZANidine  (ZANAFLEX ) 4 MG capsule Take 4 mg by mouth 3 (three) times daily as needed for muscle spasms.    [provider]  verapamil  (VERELAN ) 360 MG 24 hr capsule Take 1 capsule (360 mg total) by mouth at bedtime. 09/09/24     DULoxetine  (CYMBALTA ) 20 MG capsule Take 1 capsule (20 mg total) by mouth daily. 07/31/24 08/27/24      Physical Exam    Vital Signs:  DAIQUAN RESNIK does not have vital signs available for review today.  Given telephonic nature of communication, physical exam is limited. AAOx3. NAD. Normal affect.  Speech and respirations are unlabored.  Accessory Clinical Findings    None  Assessment & Plan    1.  Preoperative Cardiovascular Risk Assessment: According to the Revised Cardiac Risk Index (RCRI), his Perioperative Risk of Major Cardiac Event is (%): 6.6. His Functional Capacity in METs is: 5.07 according to the Duke Activity Status Index (DASI).  Therefore, based on ACC/AHA guidelines, patient would be at acceptable risk for the planned procedure without further cardiovascular testing. I will route this recommendation to the requesting party via Epic fax function.  The patient was advised that if he develops new symptoms prior to surgery to contact our office to arrange for a follow-up visit, and he verbalized  understanding.  Patient to continue daily low-dose aspirin  throughout his perioperative course   A copy of this note will be routed to requesting surgeon.  Time:   Today, I have spent 10 minutes with the patient with telehealth technology discussing medical history, symptoms, and management plan.     Robert LITTIE Louis, NP  10/20/2024, 10:51 AM

## 2024-10-21 ENCOUNTER — Ambulatory Visit
Admission: RE | Admit: 2024-10-21 | Discharge: 2024-10-21 | Disposition: A | Discharge status: Home / Self Care | Attending: Surgery | Admitting: Surgery

## 2024-10-21 ENCOUNTER — Encounter: Admission: RE | Disposition: A | Payer: Self-pay | Source: Home / Self Care | Attending: Surgery

## 2024-10-21 ENCOUNTER — Other Ambulatory Visit: Payer: Self-pay

## 2024-10-21 ENCOUNTER — Encounter: Payer: Self-pay | Admitting: Urgent Care

## 2024-10-21 ENCOUNTER — Ambulatory Visit: Payer: Self-pay | Admitting: Urgent Care

## 2024-10-21 ENCOUNTER — Encounter: Payer: Self-pay | Admitting: Surgery

## 2024-10-21 ENCOUNTER — Ambulatory Visit

## 2024-10-21 DIAGNOSIS — Z96611 Presence of right artificial shoulder joint: Secondary | ICD-10-CM | POA: Diagnosis not present

## 2024-10-21 DIAGNOSIS — M1990 Unspecified osteoarthritis, unspecified site: Secondary | ICD-10-CM | POA: Insufficient documentation

## 2024-10-21 DIAGNOSIS — J45909 Unspecified asthma, uncomplicated: Secondary | ICD-10-CM | POA: Diagnosis not present

## 2024-10-21 DIAGNOSIS — M7501 Adhesive capsulitis of right shoulder: Secondary | ICD-10-CM | POA: Diagnosis not present

## 2024-10-21 DIAGNOSIS — I700 Atherosclerosis of aorta: Secondary | ICD-10-CM | POA: Insufficient documentation

## 2024-10-21 DIAGNOSIS — E1140 Type 2 diabetes mellitus with diabetic neuropathy, unspecified: Secondary | ICD-10-CM | POA: Diagnosis not present

## 2024-10-21 DIAGNOSIS — H40849 Neovascular secondary angle closure glaucoma, unspecified eye: Secondary | ICD-10-CM | POA: Diagnosis not present

## 2024-10-21 DIAGNOSIS — Z951 Presence of aortocoronary bypass graft: Secondary | ICD-10-CM | POA: Insufficient documentation

## 2024-10-21 DIAGNOSIS — K7460 Unspecified cirrhosis of liver: Secondary | ICD-10-CM | POA: Insufficient documentation

## 2024-10-21 DIAGNOSIS — I6523 Occlusion and stenosis of bilateral carotid arteries: Secondary | ICD-10-CM | POA: Diagnosis not present

## 2024-10-21 DIAGNOSIS — Z7984 Long term (current) use of oral hypoglycemic drugs: Secondary | ICD-10-CM | POA: Insufficient documentation

## 2024-10-21 DIAGNOSIS — Z9889 Other specified postprocedural states: Secondary | ICD-10-CM | POA: Insufficient documentation

## 2024-10-21 DIAGNOSIS — G4730 Sleep apnea, unspecified: Secondary | ICD-10-CM | POA: Diagnosis not present

## 2024-10-21 DIAGNOSIS — G4733 Obstructive sleep apnea (adult) (pediatric): Secondary | ICD-10-CM | POA: Insufficient documentation

## 2024-10-21 DIAGNOSIS — G43909 Migraine, unspecified, not intractable, without status migrainosus: Secondary | ICD-10-CM | POA: Diagnosis not present

## 2024-10-21 DIAGNOSIS — E785 Hyperlipidemia, unspecified: Secondary | ICD-10-CM | POA: Insufficient documentation

## 2024-10-21 DIAGNOSIS — I2510 Atherosclerotic heart disease of native coronary artery without angina pectoris: Secondary | ICD-10-CM | POA: Diagnosis not present

## 2024-10-21 DIAGNOSIS — Z79899 Other long term (current) drug therapy: Secondary | ICD-10-CM | POA: Insufficient documentation

## 2024-10-21 DIAGNOSIS — Z87891 Personal history of nicotine dependence: Secondary | ICD-10-CM | POA: Diagnosis not present

## 2024-10-21 DIAGNOSIS — K219 Gastro-esophageal reflux disease without esophagitis: Secondary | ICD-10-CM | POA: Diagnosis not present

## 2024-10-21 DIAGNOSIS — I519 Heart disease, unspecified: Secondary | ICD-10-CM | POA: Insufficient documentation

## 2024-10-21 DIAGNOSIS — Z86718 Personal history of other venous thrombosis and embolism: Secondary | ICD-10-CM | POA: Insufficient documentation

## 2024-10-21 DIAGNOSIS — I119 Hypertensive heart disease without heart failure: Secondary | ICD-10-CM | POA: Insufficient documentation

## 2024-10-21 DIAGNOSIS — I10 Essential (primary) hypertension: Secondary | ICD-10-CM | POA: Diagnosis not present

## 2024-10-21 DIAGNOSIS — M7581 Other shoulder lesions, right shoulder: Secondary | ICD-10-CM | POA: Insufficient documentation

## 2024-10-21 DIAGNOSIS — Z7985 Long-term (current) use of injectable non-insulin antidiabetic drugs: Secondary | ICD-10-CM | POA: Insufficient documentation

## 2024-10-21 DIAGNOSIS — R162 Hepatomegaly with splenomegaly, not elsewhere classified: Secondary | ICD-10-CM | POA: Diagnosis not present

## 2024-10-21 DIAGNOSIS — Z8673 Personal history of transient ischemic attack (TIA), and cerebral infarction without residual deficits: Secondary | ICD-10-CM | POA: Insufficient documentation

## 2024-10-21 DIAGNOSIS — Z794 Long term (current) use of insulin: Secondary | ICD-10-CM | POA: Insufficient documentation

## 2024-10-21 DIAGNOSIS — E119 Type 2 diabetes mellitus without complications: Secondary | ICD-10-CM

## 2024-10-21 DIAGNOSIS — K449 Diaphragmatic hernia without obstruction or gangrene: Secondary | ICD-10-CM | POA: Insufficient documentation

## 2024-10-21 DIAGNOSIS — E11319 Type 2 diabetes mellitus with unspecified diabetic retinopathy without macular edema: Secondary | ICD-10-CM | POA: Insufficient documentation

## 2024-10-21 DIAGNOSIS — Z7982 Long term (current) use of aspirin: Secondary | ICD-10-CM | POA: Insufficient documentation

## 2024-10-21 HISTORY — PX: SHOULDER CLOSED REDUCTION: SHX1051

## 2024-10-21 LAB — GLUCOSE, CAPILLARY
Glucose-Capillary: 187 mg/dL — ABNORMAL HIGH (ref 70–99)
Glucose-Capillary: 133 mg/dL — ABNORMAL HIGH (ref 70–99)

## 2024-10-21 SURGERY — MANIPULATION, JOINT, SHOULDER, WITH ANESTHESIA
Anesthesia: Monitor Anesthesia Care | Site: Shoulder | Laterality: Right

## 2024-10-21 MED ORDER — LIDOCAINE HCL (CARDIAC) PF 100 MG/5ML IV SOSY
PREFILLED_SYRINGE | INTRAVENOUS | Status: DC | PRN
  Administered 2024-10-21: 100 mg via INTRAVENOUS

## 2024-10-21 MED ORDER — PROPOFOL 500 MG/50ML IV EMUL
INTRAVENOUS | Status: DC | PRN
  Administered 2024-10-21 (×2): 20 mg via INTRAVENOUS
  Administered 2024-10-21: 100 ug/kg/min via INTRAVENOUS

## 2024-10-21 MED ORDER — CHLORHEXIDINE GLUCONATE 0.12 % MT SOLN
OROMUCOSAL | Status: AC
  Filled 2024-10-21: qty 15

## 2024-10-21 MED ORDER — FENTANYL CITRATE (PF) 50 MCG/ML IJ SOSY
PREFILLED_SYRINGE | INTRAMUSCULAR | Status: AC
  Filled 2024-10-21: qty 1

## 2024-10-21 MED ORDER — ONDANSETRON HCL 4 MG PO TABS
4.0000 mg | ORAL_TABLET | Freq: Four times a day (QID) | ORAL | Status: DC | PRN
Start: 2024-10-21 — End: 2024-10-21

## 2024-10-21 MED ORDER — MIDAZOLAM HCL 2 MG/2ML IJ SOLN
INTRAMUSCULAR | Status: AC
  Filled 2024-10-21: qty 2

## 2024-10-21 MED ORDER — KETOROLAC TROMETHAMINE 15 MG/ML IJ SOLN
INTRAMUSCULAR | Status: AC
  Filled 2024-10-21: qty 1

## 2024-10-21 MED ORDER — BUPIVACAINE LIPOSOME 1.3 % IJ SUSP
INTRAMUSCULAR | Status: DC | PRN
  Administered 2024-10-21: 20 mL

## 2024-10-21 MED ORDER — ACETAMINOPHEN 325 MG PO TABS: 325.0000 mg | ORAL_TABLET | Freq: Four times a day (QID) | ORAL | Status: DC | PRN

## 2024-10-21 MED ORDER — ORAL CARE MOUTH RINSE: 15.0000 mL | Freq: Once | OROMUCOSAL | Status: AC

## 2024-10-21 MED ORDER — BUPIVACAINE HCL (PF) 0.5 % IJ SOLN
INTRAMUSCULAR | Status: DC | PRN
  Administered 2024-10-21: 10 mL

## 2024-10-21 MED ORDER — OXYCODONE HCL 5 MG PO TABS: 5.0000 mg | ORAL_TABLET | Freq: Once | ORAL | Status: DC | PRN

## 2024-10-21 MED ORDER — BUPIVACAINE LIPOSOME 1.3 % IJ SUSP
INTRAMUSCULAR | Status: AC
Start: 2024-10-21 — End: 2024-10-21
  Filled 2024-10-21: qty 20

## 2024-10-21 MED ORDER — LACTATED RINGERS IV SOLN: INTRAVENOUS | Status: DC

## 2024-10-21 MED ORDER — CHLORHEXIDINE GLUCONATE 4 % EX SOLN
1.0000 | CUTANEOUS | 1 refills | Status: DC
  Filled 2024-10-21: qty 946, 61d supply, fill #0

## 2024-10-21 MED ORDER — METOCLOPRAMIDE HCL 10 MG PO TABS: 5.0000 mg | ORAL_TABLET | Freq: Three times a day (TID) | ORAL | Status: DC | PRN

## 2024-10-21 MED ORDER — HYDROCODONE-ACETAMINOPHEN 5-325 MG PO TABS
1.0000 | ORAL_TABLET | Freq: Four times a day (QID) | ORAL | 0 refills | Status: DC | PRN
  Filled 2024-10-21: qty 40, 5d supply, fill #0

## 2024-10-21 MED ORDER — CHLORHEXIDINE GLUCONATE 0.12 % MT SOLN
15.0000 mL | Freq: Once | OROMUCOSAL | Status: AC
  Administered 2024-10-21: 15 mL via OROMUCOSAL

## 2024-10-21 MED ORDER — FENTANYL CITRATE (PF) 100 MCG/2ML IJ SOLN: 25.0000 ug | INTRAMUSCULAR | Status: DC | PRN

## 2024-10-21 MED ORDER — DEXAMETHASONE SOD PHOSPHATE PF 10 MG/ML IJ SOLN
INTRAMUSCULAR | Status: DC | PRN
  Administered 2024-10-21: 10 mg via INTRAVENOUS

## 2024-10-21 MED ORDER — KETOROLAC TROMETHAMINE 15 MG/ML IJ SOLN
15.0000 mg | Freq: Once | INTRAMUSCULAR | Status: AC
  Administered 2024-10-21: 15 mg via INTRAVENOUS

## 2024-10-21 MED ORDER — MUPIROCIN 2 % EX OINT
1.0000 | TOPICAL_OINTMENT | Freq: Two times a day (BID) | CUTANEOUS | 0 refills | Status: AC
  Filled 2024-10-21: qty 22, 11d supply, fill #0

## 2024-10-21 MED ORDER — LIDOCAINE HCL (PF) 2 % IJ SOLN
INTRAMUSCULAR | Status: AC
Start: 2024-10-21 — End: 2024-10-21
  Filled 2024-10-21: qty 5

## 2024-10-21 MED ORDER — MIDAZOLAM HCL (PF) 2 MG/2ML IJ SOLN
1.0000 mg | INTRAMUSCULAR | Status: DC | PRN
  Administered 2024-10-21: 2 mg via INTRAVENOUS

## 2024-10-21 MED ORDER — FENTANYL CITRATE (PF) 50 MCG/ML IJ SOSY
50.0000 ug | PREFILLED_SYRINGE | Freq: Once | INTRAMUSCULAR | Status: AC
  Administered 2024-10-21: 50 ug via INTRAVENOUS

## 2024-10-21 MED ORDER — ONDANSETRON HCL 4 MG/2ML IJ SOLN: 4.0000 mg | Freq: Once | INTRAMUSCULAR | Status: DC | PRN

## 2024-10-21 MED ORDER — ONDANSETRON HCL 4 MG/2ML IJ SOLN: 4.0000 mg | Freq: Four times a day (QID) | INTRAMUSCULAR | Status: DC | PRN

## 2024-10-21 MED ORDER — BUPIVACAINE-EPINEPHRINE (PF) 0.5% -1:200000 IJ SOLN
INTRAMUSCULAR | Status: AC
  Filled 2024-10-21: qty 10

## 2024-10-21 MED ORDER — HYDROCODONE-ACETAMINOPHEN 5-325 MG PO TABS: 1.0000 | ORAL_TABLET | ORAL | Status: DC | PRN

## 2024-10-21 MED ORDER — SODIUM CHLORIDE 0.9 % IV SOLN: INTRAVENOUS | Status: DC

## 2024-10-21 MED ORDER — ONDANSETRON HCL 4 MG/2ML IJ SOLN
INTRAMUSCULAR | Status: DC | PRN
  Administered 2024-10-21: 4 mg via INTRAVENOUS

## 2024-10-21 MED ORDER — BUPIVACAINE HCL (PF) 0.5 % IJ SOLN
INTRAMUSCULAR | Status: AC
  Filled 2024-10-21: qty 10

## 2024-10-21 MED ORDER — HYDROCODONE-ACETAMINOPHEN 5-325 MG PO TABS: 1.0000 | ORAL_TABLET | Freq: Four times a day (QID) | ORAL | 0 refills | Status: AC | PRN

## 2024-10-21 MED ORDER — BRIMONIDINE TARTRATE 0.2 % OP SOLN: 1.0000 [drp] | Freq: Two times a day (BID) | OPHTHALMIC | Status: AC

## 2024-10-21 MED ORDER — METOCLOPRAMIDE HCL 5 MG/ML IJ SOLN: 5.0000 mg | Freq: Three times a day (TID) | INTRAMUSCULAR | Status: DC | PRN

## 2024-10-21 MED ORDER — TRIAMCINOLONE ACETONIDE 40 MG/ML IJ SUSP
INTRAMUSCULAR | Status: AC
  Filled 2024-10-21: qty 1

## 2024-10-21 MED ORDER — ONDANSETRON HCL 4 MG/2ML IJ SOLN
INTRAMUSCULAR | Status: AC
Start: 2024-10-21 — End: 2024-10-21
  Filled 2024-10-21: qty 2

## 2024-10-21 MED ORDER — OXYCODONE HCL 5 MG/5ML PO SOLN: 5.0000 mg | Freq: Once | ORAL | Status: DC | PRN

## 2024-10-21 SURGICAL SUPPLY — 10 items
BNDG ADH 2 X3.75 FABRIC TAN LF (GAUZE/BANDAGES/DRESSINGS) ×1 IMPLANT
GAUZE SPONGE 4X4 12PLY STRL (GAUZE/BANDAGES/DRESSINGS) ×1 IMPLANT
KIT TURNOVER KIT A (KITS) ×1 IMPLANT
NDL HYPO 21X1.5 SAFETY (NEEDLE) ×1 IMPLANT
NEEDLE HYPO 21X1.5 SAFETY (NEEDLE) IMPLANT
PAD ALCOHOL SWAB (MISCELLANEOUS) ×2 IMPLANT
SLING ARM LARGE (SOFTGOODS) ×1 IMPLANT
SLING ARM MEDIUM (SOFTGOODS) ×1 IMPLANT
SOLUTION PREP PVP 2OZ (MISCELLANEOUS) ×1 IMPLANT
SYR 10ML LL (SYRINGE) ×1 IMPLANT

## 2024-10-21 NOTE — Op Note (Signed)
 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  Post-Op Diagnosis:   Same  Procedure:   Manipulation under anesthesia, right shoulder.  Surgeon:   DOROTHA Reyes Maltos, MD  Assistant:   None  Anesthesia:   IV sedation with interscalene block using Exparel  placed preoperatively by anesthesiologist.  Findings:   As above. Prior to manipulation, the right shoulder could be forward flexed to 105 and abducted to 95. At 90 of abduction, the shoulder could be externally rotated to 40 and internally rotated to 25. Following manipulation, the shoulder could be forward flexed to 155, abducted to 160 and, at 90 of abduction, externally rotated to 75 and internally rotated to 45.  Complications:   None  EBL:   0 cc  Fluids:   200 cc crystalloid  TT:   None  Drains:   None  Closure:   None  Brief Clinical Note:   The patient is a 68 year old male who is now 4.5 months status post a reverse right total shoulder arthroplasty. Despite extensive physical therapy, the patient continues to have difficulty regaining shoulder range of motion. The patient's history and examination are consistent with secondary adhesive capsulitis. The patient presents at this time for a manipulation under anesthesia of the right shoulder.  Procedure:   The patient underwent placement of an interscalene block using Exparel  in the preoperative holding area before being brought into the operating room and lain in the supine position. After adequate IV sedation was achieved, a timeout was performed to verify the correct surgical site. The right shoulder was gently manipulated in both abduction and external rotation, as well as adduction and internal rotation. Several soft pops were heard as the scar tissue released, permitting the improved range of motion of the shoulder as detailed above. The patient was placed  into a sling following the procedure due to his nerve block. The patient was then awakened and returned to the recovery room in satisfactory condition after tolerating the procedure well.

## 2024-10-21 NOTE — Transfer of Care (Signed)
 Immediate Anesthesia Transfer of Care Note  Patient: Norleen VEAR Keel  Procedure(s) Performed: MANIPULATION, JOINT, SHOULDER, WITH ANESTHESIA (Right: Shoulder)  Patient Location: PACU  Anesthesia Type:MAC  Level of Consciousness: awake, alert , and drowsy  Airway & Oxygen Therapy: Patient Spontanous Breathing and Patient connected to face mask oxygen  Post-op Assessment: Report given to RN and Post -op Vital signs reviewed and stable  Post vital signs: Reviewed and stable  Last Vitals:  Vitals Value Taken Time  BP 92/56 10/21/24 10:19  Temp 35.8 1019  Pulse 65 10/21/24 10:23  Resp 16 10/21/24 10:23  SpO2 97 % 10/21/24 10:23  Vitals shown include unfiled device data.  Last Pain:  Vitals:   10/21/24 0817  TempSrc: Temporal  PainSc: 7          Complications: No notable events documented.

## 2024-10-21 NOTE — Anesthesia Postprocedure Evaluation (Signed)
 Anesthesia Post Note  Patient: Robert Fernandez  Procedure(s) Performed: MANIPULATION, JOINT, SHOULDER, WITH ANESTHESIA (Right: Shoulder)  Patient location during evaluation: PACU Anesthesia Type: MAC Level of consciousness: awake and alert, oriented and patient cooperative Pain management: pain level controlled Vital Signs Assessment: post-procedure vital signs reviewed and stable Respiratory status: spontaneous breathing, nonlabored ventilation and respiratory function stable Cardiovascular status: blood pressure returned to baseline and stable Postop Assessment: adequate PO intake Anesthetic complications: no   No notable events documented.   Last Vitals:  Vitals:   10/21/24 1056 10/21/24 1112  BP: (!) 110/58 (!) 112/56  Pulse: 66 67  Resp: 18 20  Temp: (!) 36.2 C (!) 36.2 C  SpO2: 94% 94%    Last Pain:  Vitals:   10/21/24 1112  TempSrc: Temporal  PainSc: 0-No pain                 Orvill Coulthard

## 2024-10-21 NOTE — Discharge Instructions (Addendum)
 Orthopedic discharge instructions: May shower once nerve block has worn off. Apply ice frequently to shoulder. Take ibuprofen 600-800 mg TID with meals for 3-5 days, then as necessary. May supplement with ES Tylenol  if necessary. Use shoulder sling for 3 to 4 days or until nerve block has worn off. Follow-up in 10-14 days or as scheduled.   Interscalene Nerve Block with Exparel    For your surgery you have received an Interscalene Nerve Block with Exparel . Nerve Blocks affect many types of nerves, including nerves that control movement, pain and normal sensation.  You may experience feelings such as numbness, tingling, heaviness, weakness or the inability to move your arm or the feeling or sensation that your arm has fallen asleep. A nerve block with Exparel  can last up to 5 days.  Usually the weakness wears off first.  The tingling and heaviness usually wear off next.  Finally you may start to notice pain.  Keep in mind that this may occur in any order.  Once a nerve block starts to wear off it is usually completely gone within 60 minutes. ISNB may cause mild shortness of breath, a hoarse voice, blurry vision, unequal pupils, or drooping of the face on the same side as the nerve block.  These symptoms will usually resolve with the numbness.  Very rarely the procedure itself can cause mild seizures. If needed, your surgeon will give you a prescription for pain medication.  It will take about 60 minutes for the oral pain medication to become fully effective.  So, it is recommended that you start taking this medication before the nerve block first begins to wear off, or when you first begin to feel discomfort. Take your pain medication only as prescribed.  Pain medication can cause sedation and decrease your breathing if you take more than you need for the level of pain that you have. Nausea is a common side effect of many pain medications.  You may want to eat something before taking your pain medicine  to prevent nausea. After an Interscalene nerve block, you cannot feel pain, pressure or extremes in temperature in the effected arm.  Because your arm is numb it is at an increased risk for injury.  To decrease the possibility of injury, please practice the following:  While you are awake change the position of your arm frequently to prevent too much pressure on any one area for prolonged periods of time.  If you have a cast or tight dressing, check the color or your fingers every couple of hours.  Call your surgeon with the appearance of any discoloration (white or blue). If you are given a sling to wear before you go home, please wear it  at all times until the block has completely worn off.  Do not get up at night without your sling. Please contact [facility] or your surgeon if you do not begin to regain sensation after 7 days from the surgery.  Anesthesia may be contacted by calling the Same Day Surgery Department, Mon. through Fri., 6 am to 4 pm at [phone].   If you experience any other problems or concerns, please contact your surgeon's office. If you experience severe or prolonged shortness of breath go to the nearest emergency department.

## 2024-10-21 NOTE — Anesthesia Procedure Notes (Signed)
 Anesthesia Regional Block: Interscalene brachial plexus block   Pre-Anesthetic Checklist: , timeout performed,  Correct Patient, Correct Site, Correct Laterality,  Correct Procedure,, risks and benefits discussed,  Surgical consent,  Pre-op evaluation,  At surgeon's request and post-op pain management  Laterality: Right  Prep: chloraprep       Needles:  Injection technique: Single-shot  Needle Type: Echogenic Needle          Additional Needles:   Procedures:,,,, ultrasound used (permanent image in chart),,   Motor weakness within 20 minutes.  Narrative:  Start time: 10/21/2024 9:12 AM End time: 10/21/2024 9:14 AM Injection made incrementally with aspirations every 5 mL.  Performed by: Personally  Anesthesiologist: Shellie Odor, MD  Additional Notes: Functioning IV was confirmed and monitors applied.  Sterile prep and drape, hand hygiene and sterile gloves were used. Ultrasound guidance: relevant anatomy identified, needle position confirmed, local anesthetic spread visualized around nerve(s), vascular puncture avoided.  Image saved to electronic medical record.  Negative aspiration prior to incremental administration of local anesthetic for total 20 ml Exparel  and 10 ml bupivacaine  0.5% given in interscalene distribution. The patient tolerated the procedure well. Vital signs and moderate sedation medications recorded in RN notes.

## 2024-10-21 NOTE — Anesthesia Preprocedure Evaluation (Addendum)
 Anesthesia Evaluation  Patient identified by MRN, date of birth, ID band Patient awake    Reviewed: Allergy & Precautions, NPO status , Patient's Chart, lab work & pertinent test results  History of Anesthesia Complications (+) PONV and history of anesthetic complications  Airway Mallampati: III   Neck ROM: Full    Dental  (+) Missing   Pulmonary asthma , sleep apnea , former smoker   Pulmonary exam normal breath sounds clear to auscultation       Cardiovascular hypertension, + CAD (s/p CABG 2017)  Normal cardiovascular exam Rhythm:Regular Rate:Normal  Hx DVT 2017  ECG 08/04/24:  Sinus tachycardia Nonspecific inferior T wave abnormality Similar to previous tracing obtained on 04/23/2024  Echo 01/09/24:  1. Left ventricular ejection fraction, by estimation, is 55 to 60%. The left ventricle has normal function. The left ventricle has no regional wall motion abnormalities. Left ventricular diastolic parameters are consistent with Grade I diastolic dysfunction (impaired relaxation).  2. Right ventricular systolic function is normal. The right ventricular size is normal.  3. The mitral valve is normal in structure. No evidence of mitral valve regurgitation. No evidence of mitral stenosis.  4. The aortic valve is normal in structure. Aortic valve regurgitation is not visualized. No aortic stenosis is present.  5. The inferior vena cava is normal in size with greater than 50% respiratory variability, suggesting right atrial pressure of 3 mmHg.     Neuro/Psych  Headaches Diabetic retinopathy TIA (2019)   GI/Hepatic hiatal hernia,GERD  ,,(+) Cirrhosis         Endo/Other  diabetes, Type 2    Renal/GU negative Renal ROS     Musculoskeletal  (+) Arthritis ,    Abdominal   Peds  Hematology negative hematology ROS (+)   Anesthesia Other Findings Reviewed and agree with Dorise Boor pre-anesthesia clinical review note.     Cardiology note 10/20/24:  1.  Preoperative Cardiovascular Risk Assessment: According to the Revised Cardiac Risk Index (RCRI), his Perioperative Risk of Major Cardiac Event is (%): 6.6. His Functional Capacity in METs is: 5.07 according to the [facility] Activity Status Index (DASI).   Therefore, based on ACC/AHA guidelines, patient would be at acceptable risk for the planned procedure without further cardiovascular testing. I will route this recommendation to the requesting party via Epic fax function.   The patient was advised that if he develops new symptoms prior to surgery to contact our office to arrange for a follow-up visit, and he verbalized understanding.   Patient to continue daily low-dose aspirin  throughout his perioperative course    Reproductive/Obstetrics                              Anesthesia Physical Anesthesia Plan  ASA: 3  Anesthesia Plan: General and Regional   Post-op Pain Management: Regional block*   Induction: Intravenous  PONV Risk Score and Plan: 3 and Propofol  infusion, TIVA, Treatment may vary due to age or medical condition and Ondansetron   Airway Management Planned: Natural Airway  Additional Equipment:   Intra-op Plan:   Post-operative Plan:   Informed Consent: I have reviewed the patients History and Physical, chart, labs and discussed the procedure including the risks, benefits and alternatives for the proposed anesthesia with the patient or authorized representative who has indicated his/her understanding and acceptance.       Plan Discussed with: CRNA  Anesthesia Plan Comments: (Plan for preoperative interscalene nerve block and GA with natural  airway. LMA/GETA backup discussed.  Patient consented for risks of anesthesia including but not limited to:  - adverse reactions to medications - damage to eyes, teeth, lips or other oral mucosa - nerve damage due to positioning  - sore throat or hoarseness - damage to  heart, brain, nerves, lungs, other parts of body or loss of life  Informed patient about role of CRNA in peri- and intra-operative care.  Patient voiced understanding.)         Anesthesia Quick Evaluation

## 2024-10-21 NOTE — H&P (Signed)
 History of Present Illness:  Robert Fernandez is a 68 y.o. male who presents for follow-up now 11 weeks status post a reverse right total shoulder arthroplasty for osteonecrosis of the humeral head status post prior rotator cuff repair with adhesive capsulitis. Overall, the patient remains frustrated by his continued pain and limited range of motion. He denies any pain at rest, but notes that his pain will become quite pronounced with motion, noting the pain will go as high as 10/10, and for which he has been taking hydrocodone  and tizanidine  as necessary with limited benefit. The patient continues to attend physical therapy, as well as to perform exercises on his own at home, but notes little further improvement in his symptoms. He denies any reinjury to the shoulder, and denies any fevers or chills. He also denies any numbness or paresthesias down his arm to his hand.  Current Outpatient Medications:  acetaminophen  (TYLENOL ) 500 MG tablet Take 500 mg by mouth every 6 (six) hours as needed for Pain  albuterol  MDI, PROVENTIL , VENTOLIN , PROAIR , HFA 90 mcg/actuation inhaler Inhale 2 inhalations into the lungs every 4 (four) hours as needed 6.7 g 0  aspirin  81 MG EC tablet Take 81 mg by mouth once daily - hx CABG  blood glucose diagnostic test strip Use once daily 100 each 3  blood glucose diagnostic test strip 1 each (1 strip total) 3 (three) times daily Use as instructed. 300 each 4  blood glucose meter kit as directed 1 each 0  blood-glucose sensor (FREESTYLE LIBRE 3 PLUS SENSOR) Devi Use 1 each every 15 (fifteen) days 6 each 3  blood-glucose,receiver,cont (FREESTYLE LIBRE 3 READER) Misc Use 1 each as directed Check blood sugars 4 times daily. 1 each 0  brimonidine  (ALPHAGAN ) 0.2 % ophthalmic solution Place 1 drop into both eyes 2 (two) times daily 30 mL 4  empagliflozin  (JARDIANCE ) 25 mg tablet Take 1 tablet (25 mg total) by mouth daily with breakfast 90 tablet 3  flash glucose scanning (FREESTYLE LIBRE  14 DAY) reader Use as directed 1 each 0  flash glucose sensor (FREESTYLE LIBRE 14 DAY SENSOR) kit for glucose monitoring 7 kit 3  FUROsemide  (LASIX ) 20 MG tablet Take 1 tablet (20 mg total) by mouth daily. 100 tablet 3  gabapentin  (NEURONTIN ) 300 MG capsule Take 1 capsule twice a day and 3 capsules at bedtime 450 capsule 3  HYDROcodone -acetaminophen  (NORCO) 5-325 mg tablet Take 1 tablet by mouth 2 (two) times daily as needed for Pain 20 tablet 0  ibuprofen (MOTRIN) 600 MG tablet  insulin  DEGLUDEC (TRESIBA  FLEXTOUCH U-100) pen injector (concentration 100 units/mL) Inject 24 Units subcutaneously once daily Up to 30 units daily. 30 mL 4  insulin  LISPRO (HUMALOG  KWIKPEN) pen injector (concentration 100 units/mL) Inject 3 Units subcutaneously 3 (three) times daily with meals Plus 2 for 50 above 150 correction. UP to 60 units daily 60 mL 3  lancets Use 1 each 3 (three) times daily Use as instructed. 300 each 4  meclizine  (ANTIVERT ) 25 mg tablet Take 1 tablet (25 mg total) by mouth 3 (three) times daily as needed for Dizziness 30 tablet 1  methocarbamoL  (ROBAXIN ) 750 MG tablet Take 1 tablet (750 mg total) by mouth 2 (two) times daily as needed 60 tablet 11  nitroGLYcerin  (NITROSTAT ) 0.4 MG SL tablet  omeprazole  (PRILOSEC) 40 MG DR capsule Take 1 capsule (40 mg total) by mouth once daily 30 capsule 2  ondansetron  (ZOFRAN -ODT) 4 MG disintegrating tablet Take 1 tablet (4 mg total) by mouth every  8 (eight) hours as needed for Nausea 20 tablet 0  pen needle, diabetic (BD ULTRA-FINE MICRO PEN NEEDLE) 32 gauge x 1/4 needle Use 4 (four) times daily before meals and nightly 400 each 3  pioglitazone  (ACTOS ) 30 MG tablet Take 1 tablet (30 mg total) by mouth once daily 90 tablet 3  REPATHA  SURECLICK 140 mg/mL PnIj INJECT 140MG  UNDER THE SKIN EVERY 14 DAYS 2 mL 0  semaglutide  (OZEMPIC ) 1 mg/dose (4 mg/3 mL) pen injector Inject 0.75 mLs (1 mg total) subcutaneously once a week 9 mL 3  SHARPS CONTAINER  spironolactone   (ALDACTONE ) 50 MG tablet Take 1 tablet (50 mg total) by mouth once daily 100 tablet 0  tiZANidine  (ZANAFLEX ) 4 MG capsule Take 4 mg by mouth 3 (three) times daily as needed  verapamiL  (VERELAN ) 360 MG SR capsule Take 1 capsule (360 mg total) by mouth at bedtime 90 capsule 0   Allergies:  Amoxicillin-Pot Clavulanate Anaphylaxis, Palpitations, Shortness Of Breath, Syncope and hypotension (Pt. states, my BP tanked) Codeine Dizziness and Hallucination (COMBATIVE; Pt. States, It makes me act crazy) Crestor [Rosuvastatin] Other (Muscle Pain and cramps)  Cymbalta  [Duloxetine ] Headache  Hydromorphone Dizziness and Hallucination  Lipitor [Atorvastatin] Other (Muscle Pain and cramps)  Oxycodone -Acetaminophen  Nausea And Vomiting  Hydromorphone Hcl Other (Hallucinations)   Past Medical History:  Asthma, unspecified asthma severity, unspecified whether complicated, unspecified whether persistent (HHS-HCC)  Cirrhosis (CMS/HHS-HCC)  Cirrhosis of liver (CMS/HHS-HCC) 07/20/2022  Coronary artery disease  Diabetes mellitus without complication (CMS/HHS-HCC)  states AM BGs 80-120 as of 03/14/23  Drug-induced anaphylaxis  DVT (deep venous thrombosis) (CMS/HHS-HCC)  GERD (gastroesophageal reflux disease)  Heart disease  History of DVT (deep vein thrombosis) 2017  Left leg following after CABG surgery  History of staph infection 2017 after CABG- unable to verify if MRSA, was on IV abx  HLD (hyperlipidemia)  Hypertension  Obstructive sleep apnea (adult) (pediatric) 03/03/2022 (no CPAP presently (02/2023) unable to tolerate due to eye condition)  Osteoarthritis  Osteoporosis  PONV (Not an issue more recently) Poor intravenous access  Posterior tibial tendon dysfunction  Sleep apnea  Splenomegaly  Type 2 diabetes mellitus (CMS/HHS-HCC)  Wears glasses   Past Surgical History:  CARDIAC SURGERY 2017  CABG  CORONARY ARTERY BYPASS GRAFT 09/2016  LIMA to 99% LM, SVG to LCX at [place], [place]  LENS EYE  SURGERY Bilateral 2019  CE AND PCIOL OU  COLONOSCOPY W/BIOPSY N/A 01/19/2021  Procedure: Colonoscopy; Surgeon: Nola Tobias Brunt, MD; Location: [facility]; Service: Gastroenterology; Laterality: N/A;  ESOPHAGOGASTRODOUDENOSCOPY W/BIOPSY N/A 01/19/2021  Procedure: EGD; Surgeon: Nola Tobias Brunt, MD; Location: [facility]; Service: Gastroenterology; Laterality: N/A;  INSERTION AQUEOUS SHUNT Right 10/08/2022  Procedure: AQUEOUS SHUNT TO EXTRAOCULAR EQUATORIAL PLATE RESERVOIR, EXTERNAL APPROACH; WITH GRAFT; Surgeon: Arminda Rockie Section, MD; [facility]; Service: Ophthalmology; Laterality: Right;  BLEPHAROPLASTY UPPER EYELID Bilateral 03/18/2023  Procedure: Bilateral upper eyelid blepharoplasty with bilateral blepharoptosis repair; Surgeon: Barrington Selinda Righter, MD; Location[facility]; Service: Ophthalmology; Laterality: Bilateral;  INSERTION AQUEOUS SHUNT Left 07/22/2023  Procedure: AQUEOUS SHUNT TO EXTRAOCULAR EQUATORIAL PLATE RESERVOIR, EXTERNAL APPROACH; WITH GRAFT -- AHMED; Surgeon: Arminda Rockie Section, MD; [facility]; Service: Ophthalmology; Laterality: Left;  shoulder surgery 12/17/2023  Reverse total shoulder Right 07/21/2024 (Dr. Edie)  eye surgery stunt put in  ACHILLES TENDON REPAIR Bilateral  BACK SURGERY  CHOLECYSTECTOMY  CORONARY ANGIOPLASTY  EXPLORATORY LAPAROTOMY  fracture L tibia  FRACTURE SURGERY  HEMORRHOIDECTOMY EXTERNAL  Left rotator cuff repair  KNEE ARTHROSCOPY  LAPAROSCOPIC CHOLECYSTECTOMY  lumbar diskectomy   Family History:  Cataracts Mother  Cataracts Father Bill  Cancer Father Bill  Heart disease Father Bill  High blood pressure (Hypertension) Father Bill  Hyperlipidemia (Elevated cholesterol) Father Bill  Lung cancer Father Bill  Prostate cancer Father Bill  Diabetes Sister Michaelle Caldron  Diabetes type II Sister Michaelle Caldron  High blood pressure (Hypertension) Sister Michaelle Caldron  Kidney disease Sister  Michaelle Caldron  Transplant x2  Osteoarthritis Sister Michaelle Caldron  Diabetes type II Sister Aloha  High blood pressure (Hypertension) Sister Aloha  No Known Problems Brother half brother  Diabetes Son  Diabetes Maternal Grandmother Merlynn  Cataracts Maternal Grandmother Merlynn  Diabetes type II Maternal Grandmother Joyce  Anesthesia problems Neg Hx  Malignant hypertension Neg Hx   Social History:   Socioeconomic History:  Marital status: Married  Spouse name: Murry Diaz  Number of children: 2  Tobacco Use  Smoking status: Former  Current packs/day: 0.00  Average packs/day: 1 pack/day for 20.0 years (20.0 ttl pk-yrs)  Types: Cigarettes  Start date: 01/10/1981  Quit date: 01/10/2001  Years since quitting: 23.7  Passive exposure: Past  Smokeless tobacco: Former  Advertising Account Planner status: Never Used  Substance and Sexual Activity  Alcohol use: Not Currently  Drug use: Never  Sexual activity: Yes  Partners: Female  Birth control/protection: None  Social History Narrative  04/09/2024   Home: lives in one level home  Your Bedrooms is on: First Level  Fewest Steps to enter the home: 8 with handrails  Bathroom: Walk-in shower  Other persons in the home: wife and another family  Pets: dog  Support System:  Medical equipment you use daily: Blood Pressure Machine and CGM   Social Drivers of Health:   Physicist, Medical Strain: Medium Risk (07/24/2024)  Overall Financial Resource Strain (CARDIA)  Difficulty of Paying Living Expenses: Somewhat hard  Food Insecurity: No Food Insecurity (07/24/2024)  Hunger Vital Sign  Worried About Running Out of Food in the Last Year: Never true  Ran Out of Food in the Last Year: Never true  Transportation Needs: No Transportation Needs (07/24/2024)  PRAPARE - Risk Analyst (Medical): No  Lack of Transportation (Non-Medical): No   Review of Systems:  A comprehensive 14 point ROS was performed, reviewed, and the  pertinent orthopaedic findings are documented in the HPI.  Physical Exam: Vitals:  10/12/24 0951  BP: 110/66  Weight: 95.1 kg (209 lb 9.6 oz)  Height: 182.9 cm (6')  PainSc: 0-No pain  PainLoc: Shoulder   General/Constitutional: The patient appears to be well-nourished, well-developed, and in no acute distress. Neuro/Psych: Normal mood and affect, oriented to person, place and time. Eyes: Non-icteric. Pupils are equal, round, and reactive to light, and exhibit synchronous movement. ENT: Unremarkable. Lymphatic: No palpable adenopathy. Respiratory: Lungs clear to auscultation, Normal chest excursion, No wheezes, and Non-labored breathing Cardiovascular: Regular rate and rhythm. No murmurs. and No edema, swelling or tenderness, except as noted in detailed exam. Integumentary: No impressive skin lesions present, except as noted in detailed exam. Musculoskeletal: Unremarkable, except as noted in detailed exam.  Right shoulder exam: On examination, his surgical incision is well-healed and without evidence for infection. No swelling, erythema, ecchymosis, or other skin abnormalities are identified. Actively, he can forward flex to 80 degrees, abduct to 60 degrees, and internally rotate to the posterior aspect of his iliac crest. Passively, he can tolerate forward flexion at 90 degrees and abduction to 80 degrees. He experiences moderate pain at  the extremes of all motions. He demonstrates 4/5 strength with resisted internal and external rotation, as well as with gentle resisted abduction with his arm at his side. He again is grossly neurovascular intact to the right upper extremity and hand.  Assessment: 1. Adhesive capsulitis of right shoulder.  2. Status post reverse total shoulder replacement, right.  3. Rotator cuff tendinitis, right.  4. Status post right rotator cuff repair.   Plan: The treatment options were discussed with the patient and his wife. In addition, patient educational  materials were provided regarding the diagnosis and treatment options. The patient is quite frustrated by his symptoms and functional limitations, and is ready to consider more aggressive treatment options. Therefore, I have recommended a surgical procedure, specifically a manipulation under anesthesia of his right shoulder. The procedure was discussed with the patient, as were the potential risks (including bleeding, infection, nerve and/or blood vessel injury, persistent or recurrent pain/stiffness, residual weakness of the arm, loosening and/or failure of the components, dislocation, humerus fracture, need for further surgery, blood clots, strokes, heart attacks and/or arhythmias, pneumonia, etc.) and benefits. The patient states his understanding and wishes to proceed. All of the patient's questions and concerns were answered. He can call any time with further concerns. He will follow up post-surgery, routine.    H&P reviewed and patient re-examined. No changes.

## 2024-10-22 ENCOUNTER — Encounter: Payer: Self-pay | Admitting: Surgery

## 2024-10-23 DIAGNOSIS — M25511 Pain in right shoulder: Secondary | ICD-10-CM | POA: Diagnosis not present

## 2024-10-23 DIAGNOSIS — Z96611 Presence of right artificial shoulder joint: Secondary | ICD-10-CM | POA: Diagnosis not present

## 2024-10-26 DIAGNOSIS — M25511 Pain in right shoulder: Secondary | ICD-10-CM | POA: Diagnosis not present

## 2024-10-26 DIAGNOSIS — Z96611 Presence of right artificial shoulder joint: Secondary | ICD-10-CM | POA: Diagnosis not present

## 2024-10-28 ENCOUNTER — Other Ambulatory Visit: Payer: Self-pay

## 2024-10-28 DIAGNOSIS — M25511 Pain in right shoulder: Secondary | ICD-10-CM | POA: Diagnosis not present

## 2024-10-28 DIAGNOSIS — R188 Other ascites: Secondary | ICD-10-CM | POA: Diagnosis not present

## 2024-10-28 DIAGNOSIS — L989 Disorder of the skin and subcutaneous tissue, unspecified: Secondary | ICD-10-CM | POA: Diagnosis not present

## 2024-10-28 DIAGNOSIS — Z96611 Presence of right artificial shoulder joint: Secondary | ICD-10-CM | POA: Diagnosis not present

## 2024-10-28 DIAGNOSIS — G8929 Other chronic pain: Secondary | ICD-10-CM | POA: Diagnosis not present

## 2024-10-28 DIAGNOSIS — G5793 Unspecified mononeuropathy of bilateral lower limbs: Secondary | ICD-10-CM | POA: Diagnosis not present

## 2024-10-28 MED ORDER — MUPIROCIN 2 % EX OINT
TOPICAL_OINTMENT | Freq: Three times a day (TID) | CUTANEOUS | 0 refills | Status: AC
  Filled 2024-10-28: qty 22, 7d supply, fill #0

## 2024-10-28 MED ORDER — SPIRONOLACTONE 50 MG PO TABS
50.0000 mg | ORAL_TABLET | Freq: Every day | ORAL | 1 refills | Status: AC
  Filled 2024-10-28: qty 90, 90d supply, fill #0
  Filled 2025-01-28 (×2): qty 90, 90d supply, fill #1

## 2024-10-28 MED ORDER — SULFAMETHOXAZOLE-TRIMETHOPRIM 800-160 MG PO TABS
1.0000 | ORAL_TABLET | Freq: Two times a day (BID) | ORAL | 0 refills | Status: AC
  Filled 2024-10-28: qty 14, 7d supply, fill #0

## 2024-10-28 MED ORDER — GABAPENTIN 300 MG PO CAPS
ORAL_CAPSULE | ORAL | 1 refills | Status: AC
  Filled 2024-10-28: qty 450, 90d supply, fill #0
  Filled 2025-01-29: qty 450, 90d supply, fill #1

## 2024-10-29 ENCOUNTER — Other Ambulatory Visit: Payer: Self-pay | Admitting: Student in an Organized Health Care Education/Training Program

## 2024-10-29 ENCOUNTER — Other Ambulatory Visit: Payer: Self-pay

## 2024-10-29 DIAGNOSIS — M62838 Other muscle spasm: Secondary | ICD-10-CM

## 2024-10-29 DIAGNOSIS — G894 Chronic pain syndrome: Secondary | ICD-10-CM

## 2024-10-30 ENCOUNTER — Other Ambulatory Visit: Payer: Self-pay

## 2024-10-30 DIAGNOSIS — E113513 Type 2 diabetes mellitus with proliferative diabetic retinopathy with macular edema, bilateral: Secondary | ICD-10-CM | POA: Diagnosis not present

## 2024-10-31 ENCOUNTER — Other Ambulatory Visit: Payer: Self-pay

## 2024-11-02 ENCOUNTER — Other Ambulatory Visit: Payer: Self-pay

## 2024-11-03 DIAGNOSIS — Z96611 Presence of right artificial shoulder joint: Secondary | ICD-10-CM | POA: Diagnosis not present

## 2024-11-03 DIAGNOSIS — M25511 Pain in right shoulder: Secondary | ICD-10-CM | POA: Diagnosis not present

## 2024-11-03 DIAGNOSIS — M19021 Primary osteoarthritis, right elbow: Secondary | ICD-10-CM | POA: Diagnosis not present

## 2024-11-04 ENCOUNTER — Other Ambulatory Visit: Payer: Self-pay | Admitting: Student in an Organized Health Care Education/Training Program

## 2024-11-04 ENCOUNTER — Other Ambulatory Visit: Payer: Self-pay

## 2024-11-04 DIAGNOSIS — M62838 Other muscle spasm: Secondary | ICD-10-CM

## 2024-11-04 DIAGNOSIS — G894 Chronic pain syndrome: Secondary | ICD-10-CM

## 2024-11-05 ENCOUNTER — Other Ambulatory Visit: Payer: Self-pay

## 2024-11-05 DIAGNOSIS — Z96611 Presence of right artificial shoulder joint: Secondary | ICD-10-CM | POA: Diagnosis not present

## 2024-11-05 DIAGNOSIS — M25511 Pain in right shoulder: Secondary | ICD-10-CM | POA: Diagnosis not present

## 2024-11-07 ENCOUNTER — Other Ambulatory Visit: Payer: Self-pay | Admitting: Student in an Organized Health Care Education/Training Program

## 2024-11-07 ENCOUNTER — Other Ambulatory Visit: Payer: Self-pay

## 2024-11-07 DIAGNOSIS — M62838 Other muscle spasm: Secondary | ICD-10-CM

## 2024-11-07 DIAGNOSIS — G894 Chronic pain syndrome: Secondary | ICD-10-CM

## 2024-11-09 ENCOUNTER — Other Ambulatory Visit: Payer: Self-pay

## 2024-11-09 DIAGNOSIS — M25511 Pain in right shoulder: Secondary | ICD-10-CM | POA: Diagnosis not present

## 2024-11-09 DIAGNOSIS — Z96611 Presence of right artificial shoulder joint: Secondary | ICD-10-CM | POA: Diagnosis not present

## 2024-11-10 ENCOUNTER — Encounter: Payer: Self-pay | Admitting: Student in an Organized Health Care Education/Training Program

## 2024-11-10 ENCOUNTER — Other Ambulatory Visit: Payer: Self-pay

## 2024-11-12 ENCOUNTER — Other Ambulatory Visit: Payer: Self-pay

## 2024-11-16 ENCOUNTER — Other Ambulatory Visit: Payer: Self-pay

## 2024-11-16 ENCOUNTER — Other Ambulatory Visit: Payer: Self-pay | Admitting: Student in an Organized Health Care Education/Training Program

## 2024-11-16 DIAGNOSIS — Z96611 Presence of right artificial shoulder joint: Secondary | ICD-10-CM | POA: Diagnosis not present

## 2024-11-16 DIAGNOSIS — M62838 Other muscle spasm: Secondary | ICD-10-CM

## 2024-11-16 DIAGNOSIS — G894 Chronic pain syndrome: Secondary | ICD-10-CM

## 2024-11-16 DIAGNOSIS — M25511 Pain in right shoulder: Secondary | ICD-10-CM | POA: Diagnosis not present

## 2024-11-17 ENCOUNTER — Encounter: Payer: Self-pay | Admitting: Nurse Practitioner

## 2024-11-17 ENCOUNTER — Ambulatory Visit: Attending: Nurse Practitioner | Admitting: Nurse Practitioner

## 2024-11-17 ENCOUNTER — Other Ambulatory Visit: Payer: Self-pay

## 2024-11-17 VITALS — BP 118/67 | HR 83 | Temp 97.0°F | Resp 18 | Ht 72.0 in | Wt 195.0 lb

## 2024-11-17 DIAGNOSIS — Z7985 Long-term (current) use of injectable non-insulin antidiabetic drugs: Secondary | ICD-10-CM | POA: Diagnosis not present

## 2024-11-17 DIAGNOSIS — G8929 Other chronic pain: Secondary | ICD-10-CM | POA: Insufficient documentation

## 2024-11-17 DIAGNOSIS — E113511 Type 2 diabetes mellitus with proliferative diabetic retinopathy with macular edema, right eye: Secondary | ICD-10-CM | POA: Insufficient documentation

## 2024-11-17 DIAGNOSIS — M47812 Spondylosis without myelopathy or radiculopathy, cervical region: Secondary | ICD-10-CM | POA: Insufficient documentation

## 2024-11-17 DIAGNOSIS — M5412 Radiculopathy, cervical region: Secondary | ICD-10-CM | POA: Insufficient documentation

## 2024-11-17 DIAGNOSIS — M25511 Pain in right shoulder: Secondary | ICD-10-CM | POA: Insufficient documentation

## 2024-11-17 DIAGNOSIS — M62838 Other muscle spasm: Secondary | ICD-10-CM | POA: Insufficient documentation

## 2024-11-17 DIAGNOSIS — M5481 Occipital neuralgia: Secondary | ICD-10-CM | POA: Diagnosis not present

## 2024-11-17 DIAGNOSIS — M542 Cervicalgia: Secondary | ICD-10-CM | POA: Insufficient documentation

## 2024-11-17 DIAGNOSIS — Z96611 Presence of right artificial shoulder joint: Secondary | ICD-10-CM | POA: Diagnosis not present

## 2024-11-17 DIAGNOSIS — M19011 Primary osteoarthritis, right shoulder: Secondary | ICD-10-CM | POA: Diagnosis not present

## 2024-11-17 DIAGNOSIS — G894 Chronic pain syndrome: Secondary | ICD-10-CM | POA: Insufficient documentation

## 2024-11-17 MED ORDER — HYDROCODONE-ACETAMINOPHEN 5-325 MG PO TABS
1.0000 | ORAL_TABLET | Freq: Two times a day (BID) | ORAL | 0 refills | Status: AC | PRN
  Filled 2024-11-17: qty 20, 10d supply, fill #0

## 2024-11-17 MED ORDER — TIZANIDINE HCL 4 MG PO CAPS
4.0000 mg | ORAL_CAPSULE | Freq: Three times a day (TID) | ORAL | 2 refills | Status: AC | PRN
  Filled 2024-11-17: qty 90, 30d supply, fill #0
  Filled 2025-01-26: qty 180, 60d supply, fill #0

## 2024-11-17 NOTE — Progress Notes (Signed)
 PROVIDER NOTE: Interpretation of information contained herein should be left to medically-trained personnel. Specific patient instructions are provided elsewhere under Patient Instructions section of medical record. This document was created in part using AI and STT-dictation technology, any transcriptional errors that may result from this process are unintentional.  Patient: Robert Fernandez  Service: E/M   PCP: Miriam Rocky Shams, MD  DOB: [birth year]  DOS: 11/17/2024  Provider: Emmy MARLA Blanch, NP  MRN: [id number]  Delivery: Face-to-face  Specialty: Interventional Pain Management  Type: Established Patient  Setting: Ambulatory outpatient facility  Specialty designation: 09  Referring Prov.: Miriam Rocky Shams, MD  Location: Outpatient office facility       History of present illness (HPI) Robert Fernandez, a 68 y.o. year old male, is here today because of his neck pain (right side). 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  Pain Assessment: Severity of Chronic pain is reported as a 5 /10. Location: Neck Mid/Denies. Onset: More than a month ago. Quality: Burning,  Aching. Timing: Constant. Modifying factor(s): Denies. Vitals:  height is 6' (1.829 m) and weight is 195 lb (88.5 kg). His temporal temperature is 97 F (36.1 C) (abnormal). His blood pressure is 118/67 and his pulse is 83. His respiration is 18 and oxygen saturation is 98%.  BMI: Estimated body mass index is 26.45 kg/m as calculated from the following:   Height as of this encounter: 6' (1.829 m).   Weight as of this encounter: 195 lb (88.5 kg).  Last encounter: Visit date not found. Last procedure: Visit date not found.  Reason for encounter: evaluation for possible interventional PM therapy/treatment and medication management.   Discussed the use of AI scribe software for clinical note transcription with the patient, who gave verbal consent to proceed.  History of Present Illness   Robert Fernandez is a 68 year old male with a history of shoulder surgeries who presents for pain management and medication refill.  He continues to experience right-sided neck pain that significantly impacts his daily activities. He has previously received cervical epidural injections for neck pain, with the last one administered on May 20, 2024, providing relief for approximately four months. He states that it provides a 70% pain relief and functional improvement in symptoms following these injections.  He is having a flareup of right sided neck pain radiating into the arm.  We discussed repeating the cervical epidural injection at the same level, C7-T1, on the right side.   He experiences ongoing pain in his right shoulder, neck, and arm following multiple surgeries on his right shoulder. He has undergone three surgeries, with the first on December 17, 2023, the second in June or July 2025, and the most recent three weeks ago. He underwent a total shoulder replacement with a reverse configuration after the first surgery. The  pain is persistent, and the rotator cuff has not adapted well to the changes.  He is  currently taking gabapentin  for his condition. He also has a history of rapid bone growth, leading to bone spurs along his spine, feet, arms, knees, and hands, and has been diagnosed with arthritis and osteoporosis. He experiences neck pain due to abnormal bone growth.  He is seeking a refill of tizanidine  (Xenflex) 4 mg, which he takes three times a day, as his previous muscle relaxers were ineffective. He typically receives a three-month supply of this medication.     Pharmacotherapy Assessment   Tizanidine  4 mg 3 times daily for muscle spasm Monitoring: [place] PMP: PDMP reviewed during this encounter.       Pharmacotherapy: No side-effects or adverse reactions reported. Compliance: No problems identified. Effectiveness: Clinically acceptable.  Erlene Doyal SAUN[place], [place]  11/17/2024  8:24 AM  Sign when Signing Visit Safety precautions to be maintained throughout the outpatient stay will include: orient to surroundings, keep bed in low position, maintain call bell within reach at all times, provide assistance with transfer out of bed and ambulation.     UDS:  No results found for: SUMMARY  No results found for: CBDTHCR No results found for: D8THCCBX No results found for: D9THCCBX  ROS  Constitutional: Denies any fever or chills Gastrointestinal: No reported hemesis, hematochezia, vomiting, or acute GI distress Musculoskeletal: Neck pain (right side) Neurological: No reported episodes of acute onset apraxia, aphasia, dysarthria, agnosia, amnesia, paralysis, loss of coordination, or loss of consciousness  Medication Review  DULoxetine , Evolocumab , FreeStyle Freedom Lite, FreeStyle Libre 14 Day Reader, FreeStyle Libre 14 Day Sensor, FreeStyle Libre 3 Plus Sensor, [employer] 3 Reader, HYDROcodone -acetaminophen , Insulin  Pen Needle, Povidone (PF), SUMAtriptan , Semaglutide  (1 MG/DOSE), acetaminophen , albuterol , aspirin  EC, brimonidine , carvedilol , chlorhexidine , empagliflozin , freestyle,  furosemide , gabapentin , glucose blood, insulin  degludec, insulin  lispro, meclizine , mupirocin  ointment, nitroGLYCERIN , omeprazole , ondansetron , pioglitazone , spironolactone , tiZANidine , and verapamil   History Review  Allergy: Robert Fernandez is allergic to rosuvastatin, augmentin [amoxicillin-pot clavulanate], codeine, dilaudid [hydromorphone hcl], and oxycodone -acetaminophen . Drug: Robert Fernandez  reports that he does not currently use drugs. Alcohol:  reports no history of alcohol use. Tobacco:  reports that he has quit smoking. His smoking use included cigarettes. He has never used smokeless tobacco. Social: Robert Fernandez  reports that he has quit smoking. His smoking use included cigarettes. He has never used smokeless tobacco. He reports that he does not currently use drugs. He reports that he does not drink alcohol. Medical:  has a past medical history of Adhesive capsulitis of right shoulder, Aortic atherosclerosis, Arthritis, Asthma, Bacteremia due to methicillin susceptible Staphylococcus aureus (MSSA) (05/2016), Bilateral occipital neuralgia (05/20/2024), Cervical spondylosis with radiculopathy, Cirrhosis of liver (HCC), Coronary artery disease, DDD (degenerative disc disease), lumbar, Diabetic retinopathy of both eyes (HCC), Diastolic dysfunction, Dysfunction of Eustachian tube, bilateral (09/29/2020), Erectile dysfunction (08/13/2019), GERD (gastroesophageal reflux disease), Hepatosplenomegaly, Hiatal hernia, History of deep venous thrombosis, Hyperlipidemia, Hypertension, Left adrenal mass, Long-term use of aspirin  therapy, Meningioma (HCC), Methicillin resistant Staphylococcus epidermidis infection (05/2016), Migraines, Neovascular glaucoma, indeterminate stage, left, Neuropathy, Nocturnal leg cramps (09/29/2020), Nontraumatic complete tear of right rotator cuff, Obstructive sleep apnea, Osteoarthritis, 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 Surgical: Robert Fernandez  has a past  surgical history that includes LEFT HEART CATH AND CORONARY ANGIOGRAPHY (Left, 05/24/2016); Coronary artery bypass graft (N/A, 05/25/2016); Lumbar disc surgery; Laparoscopic cholecystectomy; Rotator cuff repair (Left); Tibia fracture surgery; Knee arthroscopy (Left); LENS EYE SURGERY ; INSERTION AQUEOUS SHUNT ; Achilles tendon repair; BLEPHAROPLASTY UPPER EYELID ; Finger amputation (Left); Shoulder arthroscopy with subacromial decompression, rotator cuff repair and bicep tendon repair (Right, 12/17/2023); Reverse shoulder arthroplasty (Right, 07/21/2024); Steriod injection (Right, 07/21/2024); and Shoulder Closed Reduction (Right, 10/21/2024). Family: family history includes Breast cancer in his mother and sister; Heart attack in his father; Heart disease in his maternal grandmother; Hypertension in his father.  Laboratory Chemistry Profile   Renal Lab Results  Component Value Date   BUN 17 10/20/2024   CREATININE 0.80 10/20/2024   BCR 21 05/28/2024   GFRNONAA >60 10/20/2024    Hepatic Lab Results  Component Value Date   AST 30 07/13/2024   ALT 24 07/13/2024   ALBUMIN 3.8 07/13/2024   ALKPHOS 83 07/13/2024    Electrolytes Lab Results  Component Value Date   NA 136 10/20/2024   Fernandez 3.9 10/20/2024   CL 98 10/20/2024   CALCIUM 9.1 10/20/2024    Bone No results found for: VD25OH, VD125OH2TOT, CI6874NY7, CI7874NY7, 25OHVITD1, 25OHVITD2, 25OHVITD3, TESTOFREE, TESTOSTERONE  Inflammation (CRP: Acute Phase) (ESR: Chronic Phase) Lab Results  Component Value Date   ESRSEDRATE 8 09/07/2022         Note: Above Lab results reviewed.  Recent Imaging  Review  US  OR NERVE BLOCK-IMAGE ONLY ([facility]) There is no interpretation for this exam.    This order is for images obtained during a surgical procedure.  Please See  Surgeries Tab for more information regarding the procedure. Note: Reviewed        Narrative & Impression  CLINICAL DATA:  Neck pain, chronic, degenerative changes on xray   EXAM: MRI CERVICAL SPINE WITHOUT CONTRAST   TECHNIQUE: Multiplanar, multisequence MR imaging of the cervical spine was performed. No intravenous contrast was administered.   COMPARISON:  MR C Spine 11/09/20   FINDINGS: Alignment: Physiologic.   Vertebrae: No fracture, evidence of discitis, or bone lesion.   Cord: Normal signal and morphology.   Posterior Fossa, vertebral arteries, paraspinal tissues: Negative.   Disc levels:   Limitations: Motion degraded exam   C1-C2: Mild degenerative change.   C2-C3: Mild bilateral facet degenerative change. No spinal canal or neural foraminal narrowing.   C3-C4: Mild bilateral facet degenerative change. Uncovertebral hypertrophy. Mild right neural foraminal narrowing. No spinal canal narrowing.   C4-C5: Mild bilateral facet degenerative change. Uncovertebral hypertrophy. Mild bilateral neural foraminal narrowing. No spinal canal narrowing.   C5-C6: Mild bilateral facet degenerative change. Uncovertebral hypertrophy. Mild bilateral neural foraminal narrowing. No spinal canal narrowing.   C6-C7: Circumferential disc bulge. Mild spinal canal narrowing. Uncovertebral hypertrophy. Mild bilateral neural foraminal narrowing, right-greater-than-left.   C7-T1: Mild bilateral facet degenerative change. No spinal canal or neural foraminal narrowing.   IMPRESSION: The degree of motion artifact limits accurate assessment of the degree of spinal canal or neural foraminal stenosis.   1. Mild multilevel degenerative changes without evidence of high-grade spinal canal narrowing.   2. There is mild  neural foraminal narrowing at C3-C4 (right) and C4-C7 (bilateral).     Electronically Signed   By: Lyndall Gore M.D.   On: 10/10/2023 12:55   Narrative & Impression  CLINICAL DATA:  neck pain   EXAM: CERVICAL SPINE - COMPLETE 4+ VIEW   COMPARISON:  None Available.   FINDINGS:  The cervical spine is visualized from C1-the superior endplate of C7. Cervical alignment is maintained. No evidence of dynamic instability on limited excursion flexion and extension views. Vertebral body heights are maintained: no evidence of acute fracture. Relative preservation of the disc spaces with flowing anterior osteophytes. No prevertebral soft tissue swelling. LEFT-sided carotid calcifications.   IMPRESSION: 1. No acute fracture or traumatic listhesis. 2. Flowing anterior osteophytes which can be seen in the setting of diffuse idiopathic skeletal hyperostosis.     Electronically Signed   By: Corean Salter M.D.   On: 10/12/2023 14:14     Physical Exam  Vitals: BP 118/67 (BP Location: Left Arm, Patient Position: Sitting, Cuff Size: Normal)   Pulse 83   Temp (!) 97 F (36.1 C) (Temporal)   Resp 18   Ht 6' (1.829 m)   Wt 195 lb (88.5 kg)   SpO2 98%   BMI 26.45 kg/m  BMI: Estimated body mass index is 26.45 kg/m as calculated from the following:   Height as of this encounter: 6' (1.829 m).   Weight as of this encounter: 195 lb (88.5 kg). Ideal: Ideal body weight: 77.6 kg (171 lb 1.2 oz) Adjusted ideal body weight: 81.9 kg (180 lb 10.3 oz) General appearance: Well nourished, well developed, and well hydrated. In no apparent acute distress Mental status: Alert, oriented x 3 (person, place, & time)       Respiratory: No evidence of acute respiratory distress Eyes: PERLA  Musculoskeletal: Neck pain (right side)  Cervical Spine Exam  Skin & Axial Inspection: No masses, redness, edema, swelling, or associated skin lesions Alignment: Symmetrical Functional ROM: Pain restricted  ROM, to the right Stability: No instability detected Muscle Tone/Strength: Functionally intact. No obvious neuro-muscular anomalies detected. Sensory (Neurological): Dermatomal pain pattern Palpation: No palpable anomalies              Upper Extremity (UE) Exam      Side: Right upper extremity   Side: Left upper extremity  Skin & Extremity Inspection: Skin color, temperature, and hair growth are WNL. No peripheral edema or cyanosis. No masses, redness, swelling, asymmetry, or associated skin lesions. No contractures.   Skin & Extremity Inspection: Skin color, temperature, and hair growth are WNL. No peripheral edema or cyanosis. No masses, redness, swelling, asymmetry, or associated skin lesions. No contractures.  Functional ROM: Pain restricted ROM for shoulder   Functional ROM: Unrestricted ROM          Muscle Tone/Strength: Functionally intact. No obvious neuro-muscular anomalies detected.   Muscle Tone/Strength: Functionally intact. No obvious neuro-muscular anomalies detected.  Sensory (Neurological): Arthropathic arthralgia           Sensory (Neurological): Unimpaired          Palpation: No palpable anomalies               Palpation: No palpable anomalies              Provocative Test(s):  Phalen's test: deferred Tinel's test: deferred Apley's scratch test (touch opposite shoulder):  Action 1 (Across chest): Decreased ROM Action 2 (Overhead): Decreased ROM Action 3 (LB reach): Decreased ROM     Provocative Test(s):  Phalen's test: deferred Tinel's test: deferred Apley's scratch test (touch opposite shoulder):  Action 1 (Across chest): deferred Action 2 (Overhead): deferred Action 3 (LB reach): deferred     Assessment   Diagnosis Status  1. Cervical radicular pain   2. Cervical spondylosis   3. Neck pain   4. Chronic  pain syndrome   5. 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 Other muscle spasm   7. Muscle spasms of lower extremity   8. Localized osteoarthritis of right shoulder   9. Chronic right shoulder pain   10. Bilateral occipital neuralgia   11. Status post reverse arthroplasty of shoulder, right    Having a Flare-up Having a Flare-up Having a Flare-up   Updated Problems: Problem  Chronic Pain Syndrome  Other Muscle Spasm  Muscle Spasms of Lower Extremity    Plan of Care  Problem-specific:  Assessment and Plan    Cervical radiculopathy with chronic pain syndrome Chronic cervical radiculopathy with significant pain from abnormal bone growth and spurs. Previous cervical epidural injections provided 70% pain relief for 3-4 months. - Scheduled cervical epidural injection with Dr. Marcelino in six weeks. The patient continues with chronic neck pain radiating to the arms.  MRI shows mild degenerative changes with mild to moderate foraminal narrowing at C4-C5 and right C3-C4.  Due to diabetes, we will proceed with cervical epidural injection without steroid.   Right shoulder pain, post-surgical Persistent pain post multiple surgeries, including total shoulder replacement with reverse prosthesis. Pain likely due to adaptation to prosthesis and previous bone necrosis. - Prescribed tizanidine  4 mg, three times daily, 90-day supply, two refills.   Plan: (Clinic): (R) C-ESI (intralaminar) injection # 3 with Dr. Marcelino       Robert Fernandez has a current medication list which includes the following long-term medication(s): albuterol , carvedilol , furosemide , gabapentin , gabapentin , insulin  lispro, nitroglycerin , omeprazole , pioglitazone , spironolactone , spironolactone , verapamil , and [DISCONTINUED] duloxetine .  Pharmacotherapy (Medications Ordered): Meds ordered this encounter  Medications   tiZANidine  (ZANAFLEX ) 4 MG capsule    Sig: Take 1 capsule (4 mg total) by mouth 3 (three) times daily as needed for muscle spasms.    Dispense:  90  capsule    Refill:  2   Orders:  Orders Placed This Encounter  Procedures   Cervical Epidural Injection    Sedation: Patient's choice. Purpose: Diagnostic/Therapeutic Indication(s): Radiculitis and cervicalgia associater with cervical degenerative disc disease.    Standing Status:   Future    Expiration Date:   02/17/2025    Scheduling Instructions:     Procedure: Cervical Epidural Steroid Injection/Block     Level(s): C7-T1     Laterality: Right-sided     Timeframe: 6 weeks from now.    Where will this procedure be performed?:   [facility]             by Dr. Tanya        Return in about 6 weeks (around 12/29/2024) for [facility]): (R) C-ESI (intralaminar) injection # 3 with Dr. Marcelino .    Recent Visits No visits were found meeting these conditions. Showing recent visits within past 90 days and meeting all other requirements Today's Visits Date Type Provider Dept  11/17/24 Office Visit Robert Mangel K, NP [facility]  Showing today's visits and meeting all other requirements Future Appointments Date Type Provider Dept  12/28/24 Appointment Marcelino Nurse, MD [facility]  Showing future appointments within next 90 days and meeting all other requirements  I discussed the assessment and treatment plan with the patient. The patient was provided an opportunity to ask questions and all were answered. The patient agreed with the plan and demonstrated an understanding of the instructions.  Patient advised to  call back or seek an in-person evaluation if the symptoms or condition worsens.  I personally spent a total of 30 minutes in the care of the patient today including preparing to see the patient, getting/reviewing separately obtained history, performing a medically appropriate exam/evaluation, counseling and educating, placing orders, referring and communicating with other health care professionals, documenting clinical information in the EHR,  independently interpreting results, communicating results, and coordinating care.  Note by: Kenneisha Cochrane Fernandez Ahsan Esterline, NP (TTS and AI technology used. I apologize for any typographical errors that were not detected and corrected.) Date: 11/17/2024; Time: 10:41 AM

## 2024-11-17 NOTE — Progress Notes (Signed)
 Safety precautions to be maintained throughout the outpatient stay will include: orient to surroundings, keep bed in low position, maintain call bell within reach at all times, provide assistance with transfer out of bed and ambulation.

## 2024-11-17 NOTE — Patient Instructions (Signed)
 ______________________________________________________________________    Procedure instructions  Stop blood-thinners  Do not eat or drink fluids (other than water) for 6 hours before your procedure  No water for 2 hours before your procedure  Take your blood pressure medicine with a sip of water  Arrive 30 minutes before your appointment  If sedation is planned, bring suitable driver. Nada, [place]isele, & public transportation are NOT APPROVED)  Carefully read the Preparing for your procedure detailed instructions  ______________________________________________________________________    Preparing for your procedure  Appointments: If you think you may not be able to keep your appointment, call 24-48 hours in advance to cancel. We need time to make it available to others.  Procedure visits are for procedures only. During your procedure appointment there will be: NO Prescription Refills*. NO medication changes or discussions*. NO discussion of disability issues*. NO unrelated pain problem evaluations*. NO evaluations to order other pain procedures*. *These will be addressed at a separate and distinct evaluation encounter on the provider's evaluation schedule and not during procedure days.  Instructions: Food intake: Avoid eating anything solid for at least 8 hours prior to your procedure. Clear liquid intake: You may take clear liquids such as water up to 2 hours prior to your procedure. (No carbonated drinks. No soda.) Transportation: Unless otherwise stated by your physician, bring a driver. (Driver cannot be a Market Researcher, Pharmacist, Community, or any other form of public transportation.) Morning Medicines: Except for blood thinners, take all of your other morning medications with a sip of water. Make sure to take your heart and blood pressure medicines. If your blood pressure's lower number is above 100, the case will be rescheduled. Blood thinners: Make sure to stop your blood thinners as  instructed.  If you take a blood thinner, but were not instructed to stop it, call our office [phone] and ask to talk to a nurse. Not stopping a blood thinner prior to certain procedures could lead to serious complications. Diabetics on insulin : Notify the staff so that you can be scheduled 1st case in the morning. If your diabetes requires high dose insulin , take only  of your normal insulin  dose the morning of the procedure and notify the staff that you have done so. Preventing infections: Shower with an antibacterial soap the morning of your procedure.  Build-up your immune system: Take 1000 mg of Vitamin C with every meal (3 times a day) the day prior to your procedure. Antibiotics: Inform the nursing staff if you are taking any antibiotics or if you have any conditions that may require antibiotics prior to procedures. (Example: recent joint implants)   Pregnancy: If you are pregnant make sure to notify the nursing staff. Not doing so may result in injury to the fetus, including death.  Sickness: If you have a cold, fever, or any active infections, call and cancel or reschedule your procedure. Receiving steroids while having an infection may result in complications. Arrival: You must be in the facility at least 30 minutes prior to your scheduled procedure. Tardiness: Your scheduled time is also the cutoff time. If you do not arrive at least 15 minutes prior to your procedure, you will be rescheduled.  Children: Do not bring any children with you. Make arrangements to keep them home. Dress appropriately: There is always a possibility that your clothing may get soiled. Avoid long dresses. Valuables: Do not bring any jewelry or valuables.  Reasons to call and reschedule or cancel your procedure: (Following these recommendations will minimize the risk of a  serious complication.) Surgeries: Avoid having procedures within 2 weeks of any surgery. (Avoid for 2 weeks before or after any  surgery). Flu Shots: Avoid having procedures within 2 weeks of a flu shots or . (Avoid for 2 weeks before or after immunizations). Barium: Avoid having a procedure within 7-10 days after having had a radiological study involving the use of radiological contrast. (Myelograms, Barium swallow or enema study). Heart attacks: Avoid any elective procedures or surgeries for the initial 6 months after a Myocardial Infarction (Heart Attack). Blood thinners: It is imperative that you stop these medications before procedures. Let us  know if you if you take any blood thinner.  Infection: Avoid procedures during or within two weeks of an infection (including chest colds or gastrointestinal problems). Symptoms associated with infections include: Localized redness, fever, chills, night sweats or profuse sweating, burning sensation when voiding, cough, congestion, stuffiness, runny nose, sore throat, diarrhea, nausea, vomiting, cold or Flu symptoms, recent or current infections. It is specially important if the infection is over the area that we intend to treat. Heart and lung problems: Symptoms that may suggest an active cardiopulmonary problem include: cough, chest pain, breathing difficulties or shortness of breath, dizziness, ankle swelling, uncontrolled high or unusually low blood pressure, and/or palpitations. If you are experiencing any of these symptoms, cancel your procedure and contact your primary care physician for an evaluation.  Remember:  Regular Business hours are:  Monday to Thursday 8:00 AM to 4:00 PM  Provider's Schedule: Eric Como, MD:  Procedure days: Tuesday and Thursday 7:30 AM to 4:00 PM  Wallie Sherry, MD:  Procedure days: Monday and Wednesday 7:30 AM to 4:00 PM Last  Updated: 12/03/2023 ______________________________________________________________________     ______________________________________________________________________    Blood Thinners  IMPORTANT NOTICE:  If  you take any of these, make sure to notify the nursing staff.  Failure to do so may result in serious injury.  Recommended time intervals to stop and restart blood-thinners, before & after invasive procedures  Generic Name Brand Name Pre-procedure: Stop medication for this amount of time before your procedure: Post-procedure: Wait this amount of time after the procedure before restarting your medication:  Abciximab Reopro 15 days 2 hrs  Alteplase Activase 10 days 10 days  Anagrelide Agrylin    Apixaban Eliquis 3 days 6 hrs  Cilostazol Pletal 3 days 5 hrs  Clopidogrel Plavix 7-10 days 2 hrs  Dabigatran Pradaxa 5 days 6 hrs  Dalteparin Fragmin 24 hours 4 hrs  Dipyridamole Aggrenox 11days 2 hrs  Edoxaban Lixiana; Savaysa 3 days 2 hrs  Enoxaparin   Lovenox  24 hours 4 hrs  Eptifibatide Integrillin 8 hours 2 hrs  Fondaparinux  Arixtra 72 hours 12 hrs  Hydroxychloroquine Plaquenil 11 days   Prasugrel Effient 7-10 days 6 hrs  Reteplase Retavase 10 days 10 days  Rivaroxaban Xarelto 3 days 6 hrs  Ticagrelor Brilinta 5-7 days 6 hrs  Ticlopidine Ticlid 10-14 days 2 hrs  Tinzaparin Innohep 24 hours 4 hrs  Tirofiban Aggrastat 8 hours 2 hrs  Warfarin Coumadin 5 days 2 hrs   Other medications with blood-thinning effects  NOTE: Consider stopping these if you have prolonged bleeding despite not taking any of the above blood thinners. Otherwise ask your provider and this will be decided on a case-by-case basis.  Product indications Generic (Brand) names Note  Cholesterol Lipitor Stop 4 days before procedure  Blood thinner (injectable) Heparin (LMW or LMWH Heparin) Stop 24 hours before procedure  Cancer Ibrutinib (Imbruvica) Stop 7 days before procedure  Malaria/Rheumatoid Hydroxychloroquine (Plaquenil) Stop 11 days before procedure  Thrombolytics  10 days before or after procedures   Over-the-counter (OTC) Products with blood-thinning effects  NOTE: Consider stopping these if you have prolonged  bleeding despite not taking any of the above blood thinners. Otherwise ask your provider and this will be decided on a case-by-case basis.  Product Common names Stop Time  Aspirin  > 325 mg Goody Powders, Excedrin, etc. 11 days  Aspirin  <= 81 mg  7 days  Fish oil  4 days  Garlic supplements  7 days  Ginkgo biloba  36 hours  Ginseng  24 hours  NSAIDs Ibuprofen, Naprosyn, etc. 3 days  Vitamin E  4 days   ______________________________________________________________________     If you have questions call us  at [phone]  Procedure appointments are for procedures only.   NO medication refills or new problem evaluations will be done on procedure days.   Only the scheduled, pre-approved procedure and side will be done.   ______________________________________________________________________

## 2024-11-18 ENCOUNTER — Other Ambulatory Visit: Payer: Self-pay

## 2024-11-25 DIAGNOSIS — Z96611 Presence of right artificial shoulder joint: Secondary | ICD-10-CM | POA: Diagnosis not present

## 2024-11-25 DIAGNOSIS — M25511 Pain in right shoulder: Secondary | ICD-10-CM | POA: Diagnosis not present

## 2024-12-03 ENCOUNTER — Other Ambulatory Visit: Payer: Self-pay

## 2024-12-03 DIAGNOSIS — H4053X4 Glaucoma secondary to other eye disorders, bilateral, indeterminate stage: Secondary | ICD-10-CM | POA: Diagnosis not present

## 2024-12-03 MED ORDER — BRIMONIDINE TARTRATE 0.2 % OP SOLN
1.0000 [drp] | Freq: Two times a day (BID) | OPHTHALMIC | 3 refills | Status: AC
  Filled 2024-12-03: qty 5, 50d supply, fill #0

## 2024-12-09 ENCOUNTER — Other Ambulatory Visit: Payer: Self-pay

## 2024-12-09 MED ORDER — VERAPAMIL HCL ER 360 MG PO CP24
360.0000 mg | ORAL_CAPSULE | Freq: Every day | ORAL | 1 refills | Status: AC
  Filled 2024-12-09: qty 90, 90d supply, fill #0

## 2024-12-09 MED ORDER — SUMATRIPTAN SUCCINATE 50 MG PO TABS
50.0000 mg | ORAL_TABLET | ORAL | 2 refills | Status: AC | PRN
  Filled 2024-12-09: qty 10, 30d supply, fill #0

## 2024-12-09 MED ORDER — FUROSEMIDE 20 MG PO TABS
20.0000 mg | ORAL_TABLET | Freq: Every day | ORAL | 1 refills | Status: AC
  Filled 2024-12-09: qty 90, 90d supply, fill #0

## 2024-12-09 MED ORDER — MECLIZINE HCL 25 MG PO TABS
25.0000 mg | ORAL_TABLET | Freq: Three times a day (TID) | ORAL | 1 refills | Status: AC | PRN
  Filled 2024-12-09: qty 30, 10d supply, fill #0

## 2024-12-11 ENCOUNTER — Other Ambulatory Visit: Payer: Self-pay

## 2024-12-14 ENCOUNTER — Other Ambulatory Visit: Payer: Self-pay

## 2024-12-25 ENCOUNTER — Other Ambulatory Visit: Payer: Self-pay

## 2024-12-25 MED ORDER — OMEPRAZOLE 40 MG PO CPDR
40.0000 mg | DELAYED_RELEASE_CAPSULE | Freq: Every day | ORAL | 3 refills | Status: AC
  Filled 2024-12-25 – 2024-12-26 (×2): qty 90, 90d supply, fill #0

## 2024-12-26 ENCOUNTER — Other Ambulatory Visit (HOSPITAL_COMMUNITY): Payer: Self-pay

## 2024-12-26 ENCOUNTER — Other Ambulatory Visit: Payer: Self-pay

## 2024-12-28 ENCOUNTER — Other Ambulatory Visit: Payer: Self-pay

## 2024-12-28 ENCOUNTER — Encounter: Payer: Self-pay | Admitting: Student in an Organized Health Care Education/Training Program

## 2024-12-28 ENCOUNTER — Ambulatory Visit: Admitting: Student in an Organized Health Care Education/Training Program

## 2024-12-28 ENCOUNTER — Ambulatory Visit
Admission: RE | Admit: 2024-12-28 | Discharge: 2024-12-28 | Disposition: A | Discharge status: Home / Self Care | Source: Ambulatory Visit | Attending: Student in an Organized Health Care Education/Training Program | Admitting: Student in an Organized Health Care Education/Training Program

## 2024-12-28 VITALS — BP 131/82 | Resp 16

## 2024-12-28 DIAGNOSIS — M5412 Radiculopathy, cervical region: Secondary | ICD-10-CM | POA: Insufficient documentation

## 2024-12-28 MED ORDER — ROPIVACAINE HCL 2 MG/ML IJ SOLN
1.0000 mL | Freq: Once | INTRAMUSCULAR | Status: AC
  Administered 2024-12-28: 1 mL via EPIDURAL

## 2024-12-28 MED ORDER — ROPIVACAINE HCL 2 MG/ML IJ SOLN
INTRAMUSCULAR | Status: AC
  Filled 2024-12-28: qty 20

## 2024-12-28 MED ORDER — SODIUM CHLORIDE (PF) 0.9 % IJ SOLN
INTRAMUSCULAR | Status: AC
  Filled 2024-12-28: qty 10

## 2024-12-28 MED ORDER — IOHEXOL 180 MG/ML  SOLN
INTRAMUSCULAR | Status: AC
  Filled 2024-12-28: qty 20

## 2024-12-28 MED ORDER — SODIUM CHLORIDE 0.9% FLUSH
1.0000 mL | Freq: Once | INTRAVENOUS | Status: AC
  Administered 2024-12-28: 1 mL

## 2024-12-28 MED ORDER — LIDOCAINE HCL 2 % IJ SOLN
INTRAMUSCULAR | Status: AC
  Filled 2024-12-28: qty 20

## 2024-12-28 MED ORDER — IOHEXOL 180 MG/ML  SOLN
10.0000 mL | Freq: Once | INTRAMUSCULAR | Status: AC
  Administered 2024-12-28: 10 mL via EPIDURAL

## 2024-12-28 MED ORDER — LIDOCAINE HCL 2 % IJ SOLN
20.0000 mL | Freq: Once | INTRAMUSCULAR | Status: AC
  Administered 2024-12-28: 400 mg

## 2024-12-28 MED ORDER — DEXAMETHASONE SOD PHOSPHATE PF 10 MG/ML IJ SOLN
10.0000 mg | Freq: Once | INTRAMUSCULAR | Status: AC
  Administered 2024-12-28: 10 mg

## 2024-12-28 NOTE — Progress Notes (Signed)
 PROVIDER NOTE: Interpretation of information contained herein should be left to medically-trained personnel. Specific patient instructions are provided elsewhere under Patient Instructions section of medical record. This document was created in part using STT-dictation technology, any transcriptional errors that may result from this process are unintentional.  Patient: Robert Fernandez Type: Established DOB: 21-Nov-1956 MRN: [id number] PCP: Miriam Rocky Shams, MD  Service: Procedure DOS: 12/28/2024 Setting: Ambulatory Location: Ambulatory outpatient facility Delivery: Face-to-face Provider: Wallie Sherry, MD Specialty: Interventional Pain Management Specialty designation: 09 Location: Outpatient facility Ref. Prov.: Miriam Rocky Shams, MD       Interventional Therapy   Procedure: Cervical Epidural Steroid injection (CESI) (Interlaminar) #2  Laterality: Right  Level: C7-T1 Imaging: Fluoroscopy-assisted DOS: 12/28/2024  Performed by: Wallie Sherry, MD Anesthesia: Local anesthesia (1-2% Lidocaine )  Purpose: Diagnostic/Therapeutic Indications: Cervicalgia, cervical radicular pain, degenerative disc disease, severe enough to impact quality of life or function. 1. Cervical radicular pain    NAS-11 score:   Pre-procedure: 7 /10   Post-procedure: 7  (states meds hasnt kicked in yet)/10      Position  Prep  Materials:  Location setting: Procedure suite Position: Prone, on modified reverse trendelenburg to facilitate breathing, with head in head-cradle. Pillows positioned under chest (below chin-level) with cervical spine flexed. Safety Precautions: Patient was assessed for positional comfort and pressure points before starting the procedure. Prepping solution: DuraPrep (Iodine Povacrylex [0.7% available iodine] and Isopropyl Alcohol, 74% w/w) Prep Area: Entire  cervicothoracic region Approach: percutaneous, paramedial Intended target: Posterior cervical epidural space Materials  Procedure:  Tray: Epidural Needle(s): Epidural (Tuohy) Qty: 1 Length: (90mm) 3.5-inch Gauge: 22G   H&P (Pre-op Assessment):  Robert Fernandez is a 69 y.o. (year old), male patient, seen today for interventional treatment. 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 Robert Fernandez has a current medication list which includes the following prescription(s): acetaminophen , albuterol , aspirin  ec, freestyle freedom lite, brimonidine , brimonidine , carvedilol , freestyle libre 14 day reader, freestyle libre 3 reader, freestyle libre 14 day sensor, freestyle libre 3 plus sensor, jardiance , repatha  sureclick, furosemide , gabapentin , gabapentin , precision qid test, hydrocodone -acetaminophen , insulin  degludec flextouch, insulin  lispro, aum mini insulin  pen needle, unifine pentips plus, lancets 33g, meclizine , meclizine , nitroglycerin , omeprazole , omeprazole , pioglitazone , ivizia dry eyes, ozempic  (1 mg/dose), spironolactone , spironolactone , sumatriptan , sumatriptan , tizanidine , verapamil , hydrocodone -acetaminophen , and [DISCONTINUED] duloxetine . His primarily concern today is the Neck Pain (Midline bilateral )  Initial Vital Signs:  Pulse/HCG Rate:  ECG Heart Rate: 81 Temp:   Resp: 15 BP: 124/84 SpO2: 98 %  BMI: Estimated body mass index is 26.45 kg/m as calculated from the following:   Height as of  11/17/24: 6' (1.829 m).   Weight as of 11/17/24: 195 lb (88.5 kg).  Risk Assessment: Allergies: Reviewed. He is allergic to rosuvastatin, augmentin [amoxicillin-pot clavulanate], codeine, dilaudid [hydromorphone hcl], and oxycodone -acetaminophen .  Allergy Precautions: None required Coagulopathies: Reviewed. None identified.  Blood-thinner therapy: None at this time Active Infection(s): Reviewed. None identified. Robert Fernandez is afebrile  Site Confirmation: Robert Fernandez was asked to confirm the procedure and laterality before marking the site Procedure checklist:  Completed Consent: Before the procedure and under the influence of no sedative(s), amnesic(s), or anxiolytics, the patient was informed of the treatment options, risks and possible complications. To fulfill our ethical and legal obligations, as recommended by the American Medical Association's Code of Ethics, I have informed the patient of my clinical impression; the nature and purpose of the treatment or procedure; the risks, benefits, and possible complications of the intervention; the alternatives, including doing nothing; the risk(s) and benefit(s) of the alternative treatment(s) or procedure(s); and the risk(s) and benefit(s) of doing nothing. The patient was provided information about the general risks and possible complications associated with the procedure. These may include, but are not limited to: failure to achieve desired goals, infection, bleeding, organ or nerve damage, allergic reactions, paralysis, and death. In addition, the patient was informed of those risks and complications associated to Spine-related procedures, such as failure to decrease pain; infection (i.e.: Meningitis, epidural or intraspinal abscess); bleeding (i.e.: epidural hematoma, subarachnoid hemorrhage, or any other type of intraspinal or peri-dural bleeding); organ or nerve damage (i.e.: Any type of peripheral nerve, nerve root, or spinal cord injury) with  subsequent damage to sensory, motor, and/or autonomic systems, resulting in permanent pain, numbness, and/or weakness of one or several areas of the body; allergic reactions; (i.e.: anaphylactic reaction); and/or death. Furthermore, the patient was informed of those risks and complications associated with the medications. These include, but are not limited to: allergic reactions (i.e.: anaphylactic or anaphylactoid reaction(s)); adrenal axis suppression; blood sugar elevation that in diabetics may result in ketoacidosis or comma; water retention that in patients with history of congestive heart failure may result in shortness of breath, pulmonary edema, and decompensation with resultant heart failure; weight gain; swelling or edema; medication-induced neural toxicity; particulate matter embolism and blood vessel occlusion with resultant organ, and/or nervous system infarction; and/or aseptic necrosis of one or more joints. Finally, the patient was informed that Medicine is not an exact science; therefore, there is also the possibility of unforeseen or unpredictable risks and/or possible complications that may result in a catastrophic outcome. The patient indicated having understood very clearly. We have given the patient no guarantees and we have made no promises. Enough time was given to the patient to ask questions, all of which were answered to the patient's satisfaction. Robert Fernandez has indicated that he wanted to continue with the procedure. Attestation: I, the ordering provider, attest that I have discussed with the patient the benefits, risks, side-effects, alternatives, likelihood of achieving goals, and potential problems during recovery for the procedure that I have provided informed consent. Date  Time: 12/28/2024 12:55 PM   Pre-Procedure Preparation:  Monitoring: As per clinic protocol. Respiration, ETCO2, SpO2, BP, heart rate and rhythm monitor placed and checked for adequate function Safety  Precautions: Patient was assessed for positional comfort and pressure points before starting the procedure. Time-out: I initiated and conducted the Time-out before starting the procedure, as per protocol. The patient was asked to participate by confirming the accuracy of the Time Out information. Verification of the correct person, site, and procedure were performed and confirmed by me, the nursing staff, and the patient. Time-out conducted as per Joint Commission's Universal Protocol (UP.01.01.01). Time: 1332 Start Time: 1232 hrs.  Description  Narrative of Procedure:          Rationale (medical necessity): procedure needed and proper for the diagnosis and/or treatment of the patient's medical symptoms and needs. Start Time: 1232 hrs. Safety Precautions: Aspiration looking for blood return was  conducted prior to all injections. At no point did we inject any substances, as a needle was being advanced. No attempts were made at seeking any paresthesias. Safe injection practices and needle disposal techniques used. Medications properly checked for expiration dates. SDV (single dose vial) medications used. Description of procedure: Protocol guidelines were followed. The patient was assisted into a comfortable position. The target area was identified and the area prepped in the usual manner. Skin & deeper tissues infiltrated with local anesthetic. Appropriate amount of time allowed to pass for local anesthetics to take effect. Using fluoroscopic guidance, the epidural needle was introduced through the skin, ipsilateral to the reported pain, and advanced to the target area. Posterior laminar os was contacted and the needle walked caudad, until the lamina was cleared. The ligamentum flavum was engaged and the epidural space identified using loss-of-resistance technique with 2-3 ml of PF-NaCl (0.9% NSS), in a 5cc dedicated LOR syringe. (See Imaging guidance below for use of contrast details.) Once proper  needle placement was secured, and negative aspiration confirmed, the solution was injected in intermittent fashion, asking for systemic symptoms every 0.5cc. The needles were then removed and the area cleansed, making sure to leave some of the prepping solution back to take advantage of its long term bactericidal properties.  Vitals:   12/28/24 1330 12/28/24 1340  BP: 124/84 131/82  Resp: 15 16  SpO2: 98% 99%      End Time: 1135 hrs.  Imaging Guidance (Spinal):          Type of Imaging Technique: Fluoroscopy Guidance (Spinal) Indication(s): Fluoroscopy guidance for needle placement to enhance accuracy in procedures requiring precise needle localization for targeted delivery of medication in or near specific anatomical locations not easily accessible without such real-time imaging assistance. Exposure Time: Please see nurses notes. Contrast: Before injecting any contrast, we confirmed that the patient did not have an allergy to iodine, shellfish, or radiological contrast. Once satisfactory needle placement was completed at the desired level, radiological contrast was injected. Contrast injected under live fluoroscopy. No contrast complications. See chart for type and volume of contrast used. Fluoroscopic Guidance: I was personally present during the use of fluoroscopy. Tunnel Vision Technique used to obtain the best possible view of the target area. Parallax error corrected before commencing the procedure. Direction-depth-direction technique used to introduce the needle under continuous pulsed fluoroscopy. Once target was reached, antero-posterior, oblique, and lateral fluoroscopic projection used confirm needle placement in all planes. Images permanently stored in EMR. Interpretation: I personally interpreted the imaging intraoperatively. Adequate needle placement confirmed in multiple planes. Appropriate spread of contrast into desired area was observed. No evidence of afferent or efferent  intravascular uptake. No intrathecal or subarachnoid spread observed. Permanent images saved into the patient's record.  Post-operative Assessment:  Post-procedure Vital Signs:  Pulse/HCG Rate:  84 Temp:   Resp: 16 BP: 131/82 SpO2: 99 %  EBL: None  Complications: No immediate post-treatment complications observed by team, or reported by patient.  Note: The patient tolerated the entire procedure well. A repeat set of vitals were taken after the procedure and the patient was kept under observation following institutional policy, for this type of procedure. Post-procedural neurological assessment was performed, showing return to baseline, prior to discharge. The patient was provided with post-procedure discharge instructions, including a section on how to identify potential problems. Should any problems arise concerning this procedure, the patient was given instructions to immediately contact us , at any time, without hesitation. In any case, we plan to contact  the patient by telephone for a follow-up status report regarding this interventional procedure.  Comments:  No additional relevant information.  Plan of Care (POC)  Orders:  Orders Placed This Encounter  Procedures   DG PAIN CLINIC C-ARM 1-60 MIN NO REPORT    Intraoperative interpretation by procedural physician at [facility].    Standing Status:   Standing    Number of Occurrences:   1    Reason for exam::   Assistance in needle guidance and placement for procedures requiring needle placement in or near specific anatomical locations not easily accessible without such assistance.     Medications ordered for procedure: Meds ordered this encounter  Medications   lidocaine  (XYLOCAINE ) 2 % (with pres) injection 400 mg   ropivacaine  (PF) 2 mg/mL (0.2%) (NAROPIN ) injection 1 mL   sodium chloride  flush (NS) 0.9 % injection 1 mL   dexamethasone  (DECADRON ) injection 10 mg   iohexol  (OMNIPAQUE ) 180 MG/ML injection 10 mL     Must be Myelogram-compatible. If not available, you may substitute with a water-soluble, non-ionic, hypoallergenic, myelogram-compatible radiological contrast medium.   Medications administered: We administered lidocaine , ropivacaine  (PF) 2 mg/mL (0.2%), sodium chloride  flush, dexamethasone , and iohexol .  See the medical record for exact dosing, route, and time of administration.  Follow-up plan:   Return in about 7 weeks (around 02/15/2025) for PPE, F2F.       Recent Visits Date Type Provider Dept  11/17/24 Office Visit Patel, Seema K, NP [facility]  Showing recent visits within past 90 days and meeting all other requirements Today's Visits Date Type Provider Dept  12/28/24 Procedure visit Marcelino Nurse, MD [facility]  Showing today's visits and meeting all other requirements Future Appointments Date Type Provider Dept  02/11/25 Appointment Marcelino Nurse, MD [facility]  Showing future appointments within next 90 days and meeting all other requirements  Disposition: Discharge home  Discharge (Date  Time): 12/28/2024; 1344 hrs.   Primary Care Physician: Miriam Rocky Shams, MD Location: [facility] Note by: Nurse Marcelino, MD (TTS technology used. I apologize for any typographical errors that were not detected and corrected.) Date: 12/28/2024; Time: 1:49 PM  Disclaimer:  Medicine is not an visual merchandiser. The only guarantee in medicine is that nothing is guaranteed. It is important to note that the decision to proceed with this intervention was based on the information collected from the patient. The Data and conclusions were drawn from the patient's questionnaire, the interview, and the physical examination. Because the information was provided in large part by the patient, it cannot be guaranteed that it has not been purposely or unconsciously manipulated. Every effort has been made to obtain as much relevant data as possible  for this evaluation. It is important to note that the conclusions that lead to this procedure are derived in large part from the available data. Always take into account that the treatment will also be dependent on availability of resources and existing treatment guidelines, considered by other Pain Management Practitioners as being common knowledge and practice, at the time of the intervention. For Medico-Legal purposes, it is also important to point out that variation in procedural techniques and pharmacological choices are the acceptable norm. The indications, contraindications, technique, and results of the above procedure should only be interpreted and judged by a Board-Certified Interventional Pain Specialist with extensive familiarity and expertise in the same exact procedure and technique.

## 2024-12-28 NOTE — Patient Instructions (Signed)

## 2024-12-28 NOTE — Progress Notes (Signed)
 Safety precautions to be maintained throughout the outpatient stay will include: orient to surroundings, keep bed in low position, maintain call bell within reach at all times, provide assistance with transfer out of bed and ambulation.

## 2024-12-29 ENCOUNTER — Other Ambulatory Visit: Payer: Self-pay

## 2024-12-29 ENCOUNTER — Other Ambulatory Visit (HOSPITAL_COMMUNITY): Payer: Self-pay

## 2025-01-04 ENCOUNTER — Other Ambulatory Visit: Payer: Self-pay

## 2025-01-04 ENCOUNTER — Other Ambulatory Visit (HOSPITAL_COMMUNITY): Payer: Self-pay

## 2025-01-05 ENCOUNTER — Other Ambulatory Visit: Payer: Self-pay

## 2025-01-26 ENCOUNTER — Other Ambulatory Visit: Payer: Self-pay | Admitting: Nurse Practitioner

## 2025-01-26 ENCOUNTER — Other Ambulatory Visit: Payer: Self-pay

## 2025-01-26 ENCOUNTER — Other Ambulatory Visit (HOSPITAL_COMMUNITY): Payer: Self-pay

## 2025-01-28 ENCOUNTER — Other Ambulatory Visit: Payer: Self-pay

## 2025-01-28 ENCOUNTER — Other Ambulatory Visit (HOSPITAL_COMMUNITY): Payer: Self-pay

## 2025-01-29 ENCOUNTER — Other Ambulatory Visit: Payer: Self-pay

## 2025-02-11 ENCOUNTER — Ambulatory Visit: Admitting: Student in an Organized Health Care Education/Training Program
